# Patient Record
Sex: Male | Born: 1945 | Race: White | Hispanic: No | Smoking: Former smoker
Health system: Southern US, Community
[De-identification: ages and names within clinical notes are randomized; demographics above are authoritative.]

## PROBLEM LIST (undated history)

## (undated) DIAGNOSIS — R05 Cough: Secondary | ICD-10-CM

## (undated) DIAGNOSIS — R059 Cough, unspecified: Secondary | ICD-10-CM

## (undated) DIAGNOSIS — R002 Palpitations: Secondary | ICD-10-CM

## (undated) DIAGNOSIS — I2510 Atherosclerotic heart disease of native coronary artery without angina pectoris: Secondary | ICD-10-CM

## (undated) DIAGNOSIS — K635 Polyp of colon: Secondary | ICD-10-CM

## (undated) DIAGNOSIS — I82409 Acute embolism and thrombosis of unspecified deep veins of unspecified lower extremity: Secondary | ICD-10-CM

## (undated) DIAGNOSIS — Z972 Presence of dental prosthetic device (complete) (partial): Secondary | ICD-10-CM

## (undated) DIAGNOSIS — J189 Pneumonia, unspecified organism: Secondary | ICD-10-CM

## (undated) DIAGNOSIS — E785 Hyperlipidemia, unspecified: Secondary | ICD-10-CM

## (undated) DIAGNOSIS — D689 Coagulation defect, unspecified: Secondary | ICD-10-CM

## (undated) DIAGNOSIS — K589 Irritable bowel syndrome without diarrhea: Secondary | ICD-10-CM

## (undated) DIAGNOSIS — J302 Other seasonal allergic rhinitis: Secondary | ICD-10-CM

## (undated) DIAGNOSIS — I428 Other cardiomyopathies: Secondary | ICD-10-CM

## (undated) DIAGNOSIS — F32A Depression, unspecified: Secondary | ICD-10-CM

## (undated) DIAGNOSIS — I10 Essential (primary) hypertension: Secondary | ICD-10-CM

## (undated) DIAGNOSIS — I4819 Other persistent atrial fibrillation: Secondary | ICD-10-CM

## (undated) DIAGNOSIS — F329 Major depressive disorder, single episode, unspecified: Secondary | ICD-10-CM

## (undated) DIAGNOSIS — F419 Anxiety disorder, unspecified: Secondary | ICD-10-CM

## (undated) DIAGNOSIS — K219 Gastro-esophageal reflux disease without esophagitis: Secondary | ICD-10-CM

## (undated) HISTORY — DX: Anxiety disorder, unspecified: F41.9

## (undated) HISTORY — DX: Other persistent atrial fibrillation: I48.19

## (undated) HISTORY — PX: POLYPECTOMY: SHX149

## (undated) HISTORY — DX: Irritable bowel syndrome, unspecified: K58.9

## (undated) HISTORY — DX: Other cardiomyopathies: I42.8

## (undated) HISTORY — PX: APPENDECTOMY: SHX54

## (undated) HISTORY — DX: Coagulation defect, unspecified: D68.9

## (undated) HISTORY — DX: Polyp of colon: K63.5

## (undated) HISTORY — PX: COLONOSCOPY: SHX174

## (undated) HISTORY — DX: Hyperlipidemia, unspecified: E78.5

## (undated) HISTORY — DX: Atherosclerotic heart disease of native coronary artery without angina pectoris: I25.10

## (undated) HISTORY — DX: Acute embolism and thrombosis of unspecified deep veins of unspecified lower extremity: I82.409

## (undated) HISTORY — PX: TONSILLECTOMY AND ADENOIDECTOMY: SUR1326

---

## 1996-06-26 ENCOUNTER — Encounter (INDEPENDENT_AMBULATORY_CARE_PROVIDER_SITE_OTHER): Payer: Self-pay | Admitting: Internal Medicine

## 1998-04-08 ENCOUNTER — Encounter (INDEPENDENT_AMBULATORY_CARE_PROVIDER_SITE_OTHER): Payer: Self-pay | Admitting: Internal Medicine

## 1999-05-19 ENCOUNTER — Encounter: Payer: Self-pay | Admitting: Emergency Medicine

## 1999-05-19 ENCOUNTER — Emergency Department (HOSPITAL_COMMUNITY): Admission: EM | Admit: 1999-05-19 | Discharge: 1999-05-19 | Payer: Self-pay | Admitting: Emergency Medicine

## 1999-10-08 ENCOUNTER — Encounter: Payer: Self-pay | Admitting: Cardiology

## 2000-08-17 ENCOUNTER — Encounter (INDEPENDENT_AMBULATORY_CARE_PROVIDER_SITE_OTHER): Payer: Self-pay | Admitting: Internal Medicine

## 2000-08-17 LAB — CONVERTED CEMR LAB: PSA: 0.3 ng/mL

## 2001-11-30 ENCOUNTER — Encounter (INDEPENDENT_AMBULATORY_CARE_PROVIDER_SITE_OTHER): Payer: Self-pay | Admitting: Internal Medicine

## 2001-11-30 LAB — CONVERTED CEMR LAB: PSA: 0.3 ng/mL

## 2002-12-05 ENCOUNTER — Encounter (INDEPENDENT_AMBULATORY_CARE_PROVIDER_SITE_OTHER): Payer: Self-pay | Admitting: Internal Medicine

## 2003-09-18 ENCOUNTER — Encounter (INDEPENDENT_AMBULATORY_CARE_PROVIDER_SITE_OTHER): Payer: Self-pay | Admitting: Internal Medicine

## 2003-09-23 ENCOUNTER — Encounter (INDEPENDENT_AMBULATORY_CARE_PROVIDER_SITE_OTHER): Payer: Self-pay | Admitting: Internal Medicine

## 2003-09-23 LAB — CONVERTED CEMR LAB: PSA: 0.3 ng/mL

## 2003-12-04 ENCOUNTER — Ambulatory Visit: Payer: Self-pay | Admitting: Family Medicine

## 2004-01-28 ENCOUNTER — Ambulatory Visit: Payer: Self-pay | Admitting: Family Medicine

## 2004-02-13 ENCOUNTER — Ambulatory Visit: Payer: Self-pay | Admitting: Family Medicine

## 2004-02-27 ENCOUNTER — Ambulatory Visit: Payer: Self-pay | Admitting: Family Medicine

## 2004-03-26 ENCOUNTER — Ambulatory Visit: Payer: Self-pay | Admitting: Family Medicine

## 2004-12-23 ENCOUNTER — Ambulatory Visit: Payer: Self-pay | Admitting: Family Medicine

## 2005-01-11 ENCOUNTER — Ambulatory Visit: Payer: Self-pay | Admitting: Family Medicine

## 2005-01-17 ENCOUNTER — Encounter (INDEPENDENT_AMBULATORY_CARE_PROVIDER_SITE_OTHER): Payer: Self-pay | Admitting: Internal Medicine

## 2005-01-17 LAB — CONVERTED CEMR LAB: PSA: 0.27 ng/mL

## 2005-01-26 ENCOUNTER — Encounter (INDEPENDENT_AMBULATORY_CARE_PROVIDER_SITE_OTHER): Payer: Self-pay | Admitting: Internal Medicine

## 2005-01-26 ENCOUNTER — Ambulatory Visit: Payer: Self-pay | Admitting: Family Medicine

## 2005-01-26 LAB — CONVERTED CEMR LAB: PSA: 0.27 ng/mL

## 2005-03-10 ENCOUNTER — Ambulatory Visit: Payer: Self-pay | Admitting: Family Medicine

## 2005-05-16 ENCOUNTER — Ambulatory Visit: Payer: Self-pay | Admitting: Family Medicine

## 2005-10-14 ENCOUNTER — Ambulatory Visit: Payer: Self-pay | Admitting: Family Medicine

## 2006-03-06 ENCOUNTER — Ambulatory Visit: Payer: Self-pay | Admitting: Family Medicine

## 2006-03-08 ENCOUNTER — Ambulatory Visit: Payer: Self-pay | Admitting: Gastroenterology

## 2006-03-09 ENCOUNTER — Ambulatory Visit: Payer: Self-pay | Admitting: Gastroenterology

## 2006-03-09 ENCOUNTER — Encounter (INDEPENDENT_AMBULATORY_CARE_PROVIDER_SITE_OTHER): Payer: Self-pay | Admitting: *Deleted

## 2006-04-03 ENCOUNTER — Ambulatory Visit: Payer: Self-pay | Admitting: Internal Medicine

## 2006-04-03 LAB — CONVERTED CEMR LAB
ALT: 17 units/L (ref 0–40)
AST: 18 units/L (ref 0–37)
Cholesterol: 185 mg/dL (ref 0–200)
Direct LDL: 107.1 mg/dL
HDL: 36.9 mg/dL — ABNORMAL LOW (ref >39.0)
Total CHOL/HDL Ratio: 5
Triglycerides: 220 mg/dL (ref 0–149)
VLDL: 44 mg/dL — ABNORMAL HIGH (ref 0–40)

## 2006-04-20 ENCOUNTER — Encounter: Payer: Self-pay | Admitting: Internal Medicine

## 2006-04-20 DIAGNOSIS — F331 Major depressive disorder, recurrent, moderate: Secondary | ICD-10-CM

## 2006-04-20 DIAGNOSIS — E7800 Pure hypercholesterolemia, unspecified: Secondary | ICD-10-CM

## 2006-04-20 DIAGNOSIS — I10 Essential (primary) hypertension: Secondary | ICD-10-CM | POA: Insufficient documentation

## 2006-05-12 ENCOUNTER — Ambulatory Visit: Payer: Self-pay | Admitting: Family Medicine

## 2006-05-15 ENCOUNTER — Telehealth (INDEPENDENT_AMBULATORY_CARE_PROVIDER_SITE_OTHER): Payer: Self-pay | Admitting: *Deleted

## 2006-06-14 ENCOUNTER — Telehealth (INDEPENDENT_AMBULATORY_CARE_PROVIDER_SITE_OTHER): Payer: Self-pay | Admitting: *Deleted

## 2006-07-12 ENCOUNTER — Encounter: Payer: Self-pay | Admitting: Internal Medicine

## 2006-07-12 DIAGNOSIS — Z8679 Personal history of other diseases of the circulatory system: Secondary | ICD-10-CM

## 2006-07-12 DIAGNOSIS — D126 Benign neoplasm of colon, unspecified: Secondary | ICD-10-CM | POA: Insufficient documentation

## 2006-07-19 ENCOUNTER — Ambulatory Visit: Payer: Self-pay | Admitting: Family Medicine

## 2006-08-07 ENCOUNTER — Telehealth (INDEPENDENT_AMBULATORY_CARE_PROVIDER_SITE_OTHER): Payer: Self-pay | Admitting: Internal Medicine

## 2006-09-29 ENCOUNTER — Telehealth (INDEPENDENT_AMBULATORY_CARE_PROVIDER_SITE_OTHER): Payer: Self-pay | Admitting: Internal Medicine

## 2006-10-09 ENCOUNTER — Ambulatory Visit: Payer: Self-pay | Admitting: Cardiology

## 2006-10-17 ENCOUNTER — Ambulatory Visit: Payer: Self-pay

## 2006-10-17 ENCOUNTER — Emergency Department (HOSPITAL_COMMUNITY): Admission: EM | Admit: 2006-10-17 | Discharge: 2006-10-17 | Payer: Self-pay | Admitting: Emergency Medicine

## 2006-11-09 ENCOUNTER — Ambulatory Visit: Payer: Self-pay | Admitting: Cardiology

## 2006-11-14 ENCOUNTER — Encounter (INDEPENDENT_AMBULATORY_CARE_PROVIDER_SITE_OTHER): Payer: Self-pay | Admitting: Internal Medicine

## 2007-01-24 ENCOUNTER — Ambulatory Visit: Payer: Self-pay | Admitting: Family Medicine

## 2007-01-28 ENCOUNTER — Encounter (INDEPENDENT_AMBULATORY_CARE_PROVIDER_SITE_OTHER): Payer: Self-pay | Admitting: Internal Medicine

## 2007-01-29 LAB — CONVERTED CEMR LAB
Calcium: 9.2 mg/dL (ref 8.4–10.5)
GFR calc Af Amer: 66 mL/min
GFR calc non Af Amer: 55 mL/min
Glucose, Bld: 102 mg/dL — ABNORMAL HIGH (ref 70–99)
PSA: 1.83 ng/mL (ref 0.10–4.00)
Potassium: 4.6 meq/L (ref 3.5–5.1)
Total CHOL/HDL Ratio: 8.6
Triglycerides: 558 mg/dL (ref 0–149)

## 2007-03-27 ENCOUNTER — Ambulatory Visit: Payer: Self-pay | Admitting: Family Medicine

## 2007-03-30 LAB — CONVERTED CEMR LAB
ALT: 26 units/L (ref 0–53)
LDL Cholesterol: 123 mg/dL — ABNORMAL HIGH (ref 0–99)
Total CHOL/HDL Ratio: 4.6
Triglycerides: 100 mg/dL (ref 0–149)
VLDL: 20 mg/dL (ref 0–40)

## 2007-04-10 ENCOUNTER — Telehealth (INDEPENDENT_AMBULATORY_CARE_PROVIDER_SITE_OTHER): Payer: Self-pay | Admitting: Internal Medicine

## 2007-04-30 ENCOUNTER — Ambulatory Visit: Payer: Self-pay | Admitting: Family Medicine

## 2007-04-30 DIAGNOSIS — J309 Allergic rhinitis, unspecified: Secondary | ICD-10-CM | POA: Insufficient documentation

## 2007-04-30 DIAGNOSIS — F528 Other sexual dysfunction not due to a substance or known physiological condition: Secondary | ICD-10-CM | POA: Insufficient documentation

## 2007-06-05 ENCOUNTER — Telehealth (INDEPENDENT_AMBULATORY_CARE_PROVIDER_SITE_OTHER): Payer: Self-pay | Admitting: Internal Medicine

## 2007-06-18 ENCOUNTER — Ambulatory Visit: Payer: Self-pay | Admitting: Family Medicine

## 2007-06-26 LAB — CONVERTED CEMR LAB
ALT: 27 units/L (ref 0–53)
AST: 24 units/L (ref 0–37)
Total CHOL/HDL Ratio: 4.5
Triglycerides: 73 mg/dL (ref 0–149)

## 2007-06-29 ENCOUNTER — Ambulatory Visit: Payer: Self-pay | Admitting: Family Medicine

## 2007-07-18 ENCOUNTER — Telehealth (INDEPENDENT_AMBULATORY_CARE_PROVIDER_SITE_OTHER): Payer: Self-pay | Admitting: Internal Medicine

## 2007-08-07 ENCOUNTER — Encounter (INDEPENDENT_AMBULATORY_CARE_PROVIDER_SITE_OTHER): Payer: Self-pay | Admitting: Internal Medicine

## 2007-08-13 ENCOUNTER — Telehealth (INDEPENDENT_AMBULATORY_CARE_PROVIDER_SITE_OTHER): Payer: Self-pay | Admitting: Internal Medicine

## 2007-09-10 ENCOUNTER — Encounter (INDEPENDENT_AMBULATORY_CARE_PROVIDER_SITE_OTHER): Payer: Self-pay | Admitting: Internal Medicine

## 2007-10-16 ENCOUNTER — Telehealth: Payer: Self-pay | Admitting: Family Medicine

## 2007-10-30 ENCOUNTER — Ambulatory Visit: Payer: Self-pay | Admitting: Family Medicine

## 2007-10-30 DIAGNOSIS — Z85828 Personal history of other malignant neoplasm of skin: Secondary | ICD-10-CM | POA: Insufficient documentation

## 2007-10-31 LAB — CONVERTED CEMR LAB
BUN: 14 mg/dL (ref 6–23)
CO2: 30 meq/L (ref 19–32)
Chloride: 107 meq/L (ref 96–112)
Cholesterol: 174 mg/dL (ref 0–200)
Creatinine, Ser: 1.4 mg/dL (ref 0.4–1.5)
PSA: 0.39 ng/mL (ref 0.10–4.00)
Potassium: 4.8 meq/L (ref 3.5–5.1)
Triglycerides: 91 mg/dL (ref 0–149)

## 2007-11-12 ENCOUNTER — Telehealth (INDEPENDENT_AMBULATORY_CARE_PROVIDER_SITE_OTHER): Payer: Self-pay | Admitting: Internal Medicine

## 2007-12-04 ENCOUNTER — Telehealth (INDEPENDENT_AMBULATORY_CARE_PROVIDER_SITE_OTHER): Payer: Self-pay | Admitting: Internal Medicine

## 2007-12-05 ENCOUNTER — Telehealth (INDEPENDENT_AMBULATORY_CARE_PROVIDER_SITE_OTHER): Payer: Self-pay | Admitting: Internal Medicine

## 2007-12-05 ENCOUNTER — Encounter (INDEPENDENT_AMBULATORY_CARE_PROVIDER_SITE_OTHER): Payer: Self-pay | Admitting: Internal Medicine

## 2007-12-25 ENCOUNTER — Ambulatory Visit: Payer: Self-pay | Admitting: Family Medicine

## 2008-01-01 ENCOUNTER — Ambulatory Visit: Payer: Self-pay | Admitting: Family Medicine

## 2008-01-01 DIAGNOSIS — J069 Acute upper respiratory infection, unspecified: Secondary | ICD-10-CM | POA: Insufficient documentation

## 2008-01-09 ENCOUNTER — Encounter (INDEPENDENT_AMBULATORY_CARE_PROVIDER_SITE_OTHER): Payer: Self-pay | Admitting: *Deleted

## 2008-01-09 LAB — CONVERTED CEMR LAB
ALT: 30 units/L (ref 0–53)
Albumin: 3.5 g/dL (ref 3.5–5.2)
Alkaline Phosphatase: 60 units/L (ref 39–117)
Cholesterol: 200 mg/dL (ref 0–200)
LDL Cholesterol: 141 mg/dL — ABNORMAL HIGH (ref 0–99)
Total Protein: 6.6 g/dL (ref 6.0–8.3)
Triglycerides: 119 mg/dL (ref 0–149)
VLDL: 24 mg/dL (ref 0–40)

## 2008-01-21 ENCOUNTER — Telehealth (INDEPENDENT_AMBULATORY_CARE_PROVIDER_SITE_OTHER): Payer: Self-pay | Admitting: Internal Medicine

## 2008-02-11 ENCOUNTER — Telehealth: Payer: Self-pay | Admitting: Family Medicine

## 2008-04-23 ENCOUNTER — Encounter (INDEPENDENT_AMBULATORY_CARE_PROVIDER_SITE_OTHER): Payer: Self-pay | Admitting: *Deleted

## 2008-04-30 ENCOUNTER — Ambulatory Visit: Payer: Self-pay | Admitting: Family Medicine

## 2008-05-01 LAB — CONVERTED CEMR LAB
ALT: 24 units/L (ref 0–53)
Calcium: 9 mg/dL (ref 8.4–10.5)
GFR calc non Af Amer: 54.26 mL/min (ref >60)
Glucose, Bld: 105 mg/dL — ABNORMAL HIGH (ref 70–99)
HDL: 44.8 mg/dL (ref >39.00)
Potassium: 5.1 meq/L (ref 3.5–5.1)
Sodium: 144 meq/L (ref 135–145)
TSH: 1.15 microintl units/mL (ref 0.35–5.50)
Triglycerides: 87 mg/dL (ref 0.0–149.0)

## 2008-06-26 DIAGNOSIS — R609 Edema, unspecified: Secondary | ICD-10-CM | POA: Insufficient documentation

## 2008-06-27 ENCOUNTER — Telehealth (INDEPENDENT_AMBULATORY_CARE_PROVIDER_SITE_OTHER): Payer: Self-pay | Admitting: Internal Medicine

## 2008-07-18 ENCOUNTER — Ambulatory Visit: Payer: Self-pay | Admitting: Family Medicine

## 2008-07-18 ENCOUNTER — Inpatient Hospital Stay: Payer: Self-pay | Admitting: Internal Medicine

## 2008-07-18 ENCOUNTER — Encounter: Payer: Self-pay | Admitting: Cardiology

## 2008-07-18 ENCOUNTER — Encounter (INDEPENDENT_AMBULATORY_CARE_PROVIDER_SITE_OTHER): Payer: Self-pay | Admitting: Internal Medicine

## 2008-07-18 DIAGNOSIS — Z86718 Personal history of other venous thrombosis and embolism: Secondary | ICD-10-CM | POA: Insufficient documentation

## 2008-07-20 ENCOUNTER — Encounter (INDEPENDENT_AMBULATORY_CARE_PROVIDER_SITE_OTHER): Payer: Self-pay | Admitting: Internal Medicine

## 2008-07-20 ENCOUNTER — Encounter: Payer: Self-pay | Admitting: Cardiology

## 2008-07-22 ENCOUNTER — Ambulatory Visit: Payer: Self-pay | Admitting: Cardiovascular Disease

## 2008-07-22 ENCOUNTER — Encounter: Payer: Self-pay | Admitting: Family Medicine

## 2008-07-23 ENCOUNTER — Encounter: Payer: Self-pay | Admitting: Cardiology

## 2008-07-25 ENCOUNTER — Encounter: Payer: Self-pay | Admitting: Cardiology

## 2008-07-25 ENCOUNTER — Other Ambulatory Visit: Payer: Self-pay | Admitting: Cardiology

## 2008-07-28 ENCOUNTER — Encounter: Payer: Self-pay | Admitting: Cardiology

## 2008-07-29 ENCOUNTER — Ambulatory Visit: Payer: Self-pay | Admitting: Family Medicine

## 2008-08-05 LAB — CONVERTED CEMR LAB: INR: 1.3 (ref 0.0–1.5)

## 2008-08-06 ENCOUNTER — Ambulatory Visit: Payer: Self-pay | Admitting: Cardiology

## 2008-08-13 ENCOUNTER — Ambulatory Visit: Payer: Self-pay | Admitting: Internal Medicine

## 2008-08-27 ENCOUNTER — Ambulatory Visit: Payer: Self-pay | Admitting: Cardiovascular Disease

## 2008-08-28 ENCOUNTER — Telehealth (INDEPENDENT_AMBULATORY_CARE_PROVIDER_SITE_OTHER): Payer: Self-pay | Admitting: Internal Medicine

## 2008-09-01 ENCOUNTER — Encounter: Payer: Self-pay | Admitting: *Deleted

## 2008-09-02 ENCOUNTER — Ambulatory Visit: Payer: Self-pay | Admitting: Family Medicine

## 2008-09-05 ENCOUNTER — Telehealth (INDEPENDENT_AMBULATORY_CARE_PROVIDER_SITE_OTHER): Payer: Self-pay | Admitting: Internal Medicine

## 2008-09-09 ENCOUNTER — Telehealth (INDEPENDENT_AMBULATORY_CARE_PROVIDER_SITE_OTHER): Payer: Self-pay | Admitting: Internal Medicine

## 2008-09-16 ENCOUNTER — Ambulatory Visit: Payer: Self-pay | Admitting: Family Medicine

## 2008-09-18 ENCOUNTER — Ambulatory Visit: Payer: Self-pay | Admitting: Internal Medicine

## 2008-09-18 LAB — CONVERTED CEMR LAB
POC INR: 4
Prothrombin Time: 24.4 s

## 2008-10-02 ENCOUNTER — Ambulatory Visit: Payer: Self-pay | Admitting: Cardiology

## 2008-10-28 ENCOUNTER — Ambulatory Visit: Payer: Self-pay | Admitting: Family Medicine

## 2008-10-29 LAB — CONVERTED CEMR LAB
ALT: 19 units/L (ref 0–53)
AST: 22 units/L (ref 0–37)
BUN: 19 mg/dL (ref 6–23)
Calcium: 9.4 mg/dL (ref 8.4–10.5)
Cholesterol: 195 mg/dL (ref 0–200)
Creatinine, Ser: 1.6 mg/dL — ABNORMAL HIGH (ref 0.4–1.5)
GFR calc non Af Amer: 46.44 mL/min (ref >60)
Glucose, Bld: 97 mg/dL (ref 70–99)
PSA: 0.62 ng/mL (ref 0.10–4.00)
Potassium: 5.1 meq/L (ref 3.5–5.1)
VLDL: 24.4 mg/dL (ref 0.0–40.0)

## 2008-10-30 ENCOUNTER — Ambulatory Visit: Payer: Self-pay | Admitting: Cardiology

## 2008-10-30 LAB — CONVERTED CEMR LAB: POC INR: 2.6

## 2008-11-27 ENCOUNTER — Ambulatory Visit: Payer: Self-pay | Admitting: Cardiology

## 2008-11-27 LAB — CONVERTED CEMR LAB: POC INR: 3.6

## 2008-12-08 ENCOUNTER — Ambulatory Visit: Payer: Self-pay | Admitting: Internal Medicine

## 2008-12-08 LAB — CONVERTED CEMR LAB: POC INR: 2.1

## 2008-12-30 ENCOUNTER — Ambulatory Visit: Payer: Self-pay

## 2008-12-30 ENCOUNTER — Encounter: Payer: Self-pay | Admitting: Cardiology

## 2009-01-05 ENCOUNTER — Ambulatory Visit: Payer: Self-pay | Admitting: Cardiovascular Disease

## 2009-01-06 ENCOUNTER — Telehealth: Payer: Self-pay | Admitting: Cardiology

## 2009-01-20 ENCOUNTER — Ambulatory Visit: Payer: Self-pay | Admitting: Cardiology

## 2009-02-10 ENCOUNTER — Ambulatory Visit: Payer: Self-pay | Admitting: Cardiology

## 2009-02-10 LAB — CONVERTED CEMR LAB: POC INR: 2.3

## 2009-02-17 ENCOUNTER — Telehealth: Payer: Self-pay | Admitting: Family Medicine

## 2009-03-10 ENCOUNTER — Ambulatory Visit: Payer: Self-pay | Admitting: Cardiovascular Disease

## 2009-03-19 ENCOUNTER — Telehealth: Payer: Self-pay | Admitting: Family Medicine

## 2009-03-19 ENCOUNTER — Telehealth: Payer: Self-pay | Admitting: Cardiology

## 2009-04-07 ENCOUNTER — Ambulatory Visit: Payer: Self-pay | Admitting: Cardiovascular Disease

## 2009-04-07 LAB — CONVERTED CEMR LAB: POC INR: 3.3

## 2009-04-30 ENCOUNTER — Ambulatory Visit: Payer: Self-pay | Admitting: Cardiology

## 2009-04-30 LAB — CONVERTED CEMR LAB: POC INR: 1.7

## 2009-05-14 ENCOUNTER — Ambulatory Visit: Payer: Self-pay | Admitting: Cardiology

## 2009-05-14 LAB — CONVERTED CEMR LAB: POC INR: 2.6

## 2009-05-21 ENCOUNTER — Ambulatory Visit: Payer: Self-pay | Admitting: Family Medicine

## 2009-05-22 LAB — CONVERTED CEMR LAB
ALT: 24 units/L (ref 0–53)
AST: 26 units/L (ref 0–37)
Albumin: 4 g/dL (ref 3.5–5.2)
BUN: 19 mg/dL (ref 6–23)
Calcium: 9.1 mg/dL (ref 8.4–10.5)
Creatinine, Ser: 1.2 mg/dL (ref 0.4–1.5)
Glucose, Bld: 103 mg/dL — ABNORMAL HIGH (ref 70–99)
HDL: 47.6 mg/dL (ref >39.00)
PSA: 0.67 ng/mL (ref 0.10–4.00)
Potassium: 5 meq/L (ref 3.5–5.1)
Sodium: 143 meq/L (ref 135–145)
Total Bilirubin: 0.6 mg/dL (ref 0.3–1.2)
Triglycerides: 111 mg/dL (ref 0.0–149.0)

## 2009-06-01 ENCOUNTER — Telehealth: Payer: Self-pay | Admitting: Family Medicine

## 2009-06-11 ENCOUNTER — Ambulatory Visit: Payer: Self-pay | Admitting: Cardiovascular Disease

## 2009-06-11 LAB — CONVERTED CEMR LAB: POC INR: 2.4

## 2009-07-01 ENCOUNTER — Telehealth: Payer: Self-pay | Admitting: Family Medicine

## 2009-07-13 ENCOUNTER — Ambulatory Visit: Payer: Self-pay | Admitting: Cardiovascular Disease

## 2009-08-19 ENCOUNTER — Ambulatory Visit: Payer: Self-pay | Admitting: Cardiovascular Disease

## 2009-08-21 ENCOUNTER — Ambulatory Visit: Payer: Self-pay | Admitting: Family Medicine

## 2009-08-24 LAB — CONVERTED CEMR LAB
Direct LDL: 149.4 mg/dL
HDL: 46.8 mg/dL (ref >39.00)
Total CHOL/HDL Ratio: 5
Triglycerides: 180 mg/dL — ABNORMAL HIGH (ref 0.0–149.0)

## 2009-08-28 ENCOUNTER — Ambulatory Visit: Payer: Self-pay | Admitting: Family Medicine

## 2009-08-28 ENCOUNTER — Ambulatory Visit: Payer: Self-pay | Admitting: Cardiology

## 2009-09-16 ENCOUNTER — Ambulatory Visit: Payer: Self-pay | Admitting: Cardiology

## 2009-09-16 LAB — CONVERTED CEMR LAB: POC INR: 2.9

## 2009-10-14 ENCOUNTER — Ambulatory Visit: Payer: Self-pay | Admitting: Cardiology

## 2009-10-26 ENCOUNTER — Ambulatory Visit: Payer: Self-pay | Admitting: Family Medicine

## 2009-10-27 ENCOUNTER — Ambulatory Visit: Payer: Self-pay | Admitting: Family Medicine

## 2009-10-28 ENCOUNTER — Encounter: Payer: Self-pay | Admitting: Family Medicine

## 2009-10-28 LAB — CONVERTED CEMR LAB
AST: 22 units/L (ref 0–37)
Albumin: 4 g/dL (ref 3.5–5.2)
HDL: 38.7 mg/dL — ABNORMAL LOW (ref >39.00)
LDL Cholesterol: 99 mg/dL (ref 0–99)
Total Bilirubin: 0.7 mg/dL (ref 0.3–1.2)
Total CHOL/HDL Ratio: 4
Triglycerides: 112 mg/dL (ref 0.0–149.0)
VLDL: 22.4 mg/dL (ref 0.0–40.0)

## 2009-11-11 ENCOUNTER — Ambulatory Visit: Payer: Self-pay | Admitting: Cardiovascular Disease

## 2009-11-11 LAB — CONVERTED CEMR LAB: POC INR: 2.8

## 2009-12-09 ENCOUNTER — Ambulatory Visit: Payer: Self-pay | Admitting: Cardiovascular Disease

## 2009-12-09 LAB — CONVERTED CEMR LAB: POC INR: 2.6

## 2010-01-06 ENCOUNTER — Ambulatory Visit: Payer: Self-pay

## 2010-01-13 ENCOUNTER — Ambulatory Visit
Admission: RE | Admit: 2010-01-13 | Discharge: 2010-01-13 | Payer: Self-pay | Source: Home / Self Care | Attending: Cardiovascular Disease | Admitting: Cardiovascular Disease

## 2010-01-26 ENCOUNTER — Telehealth: Payer: Self-pay | Admitting: Family Medicine

## 2010-02-16 NOTE — Assessment & Plan Note (Signed)
Summary: F6M/AMD  Medications Added OMEPRAZOLE 40 MG CPDR (OMEPRAZOLE) Take 1 tablet daily. PROSTAGLANDIN E1  POWD (ALPROSTADIL) 40 micrograms/ml as needed        Visit Type:  6 mo f/u Primary Provider:  Ruthe Mannan MD  CC:  headache says due to sinuses...denies any other complaints today.  History of Present Illness: Anthony Kim comes in today for followup of his history of right lower extremity DVT and Coumadin.  His clot is resolved ultrasound. We have decided to continue him on lifelong Coumadin. He's had no problems with his protimes and no sign of bleeding.  He denies any angina or chest pain.  His blood pressure in relatively good control. His lower extremity edema has responded well to support hose which he does not mind wearing.  His most recent lipid panel I've reviewed with him today. Total cholesterol 220, triglycerides 180, HDL 46, LDL 149. I recommended a statin and come off WelChol. I would avoid simvastatin since he is on TriCor.  Current Medications (verified): 1)  Zegerid 40-1100 Mg Caps (Omeprazole-Sodium Bicarbonate) .... Take One By Mouth Daily 2)  Welchol 625 Mg  Tabs (Colesevelam Hcl) .... 6 Tabs Once Daily 3)  One-Daily Multivitamins   Tabs (Multiple Vitamin) .... Take 1 Tablet By Mouth Two Times A Day 4)  Wellbutrin Xl 150 Mg  Tb24 (Bupropion Hcl) .Marland Kitchen.. 1 By Mouth Once Daily 5)  Senna Leaves   Leav (Senna) .... 2 Once Daily 6)  Benadryl 25 Mg  Caps (Diphenhydramine Hcl) .... 2 By Mouth At Bedtime As Needed 7)  Xanax Xr 0.5 Mg  Tb24 (Alprazolam) .... Take 1 -2 By Mouth Daily 8)  Claritin 10 Mg Tabs (Loratadine) .... As Needed 9)  Tricor 145 Mg Tabs (Fenofibrate) .Marland Kitchen.. 1 Once Daily For Triglycerides and Cholesterol 10)  Quercetin 50 Mg Tabs (Quercetin) .... Otc Takes  One To Two Daily 11)  Warfarin Sodium 5 Mg Tabs (Warfarin Sodium) .... Take One and A Half Tabs By Mouth Daily As Directed. 12)  Prostaglandin E1  Powd (Alprostadil) .... 40 Micrograms/ml As Needed 13)   Micardis 40 Mg Tabs (Telmisartan) .... Take One Tablet By Mouth Daily 14)  Fish Oil   Oil (Fish Oil) .... Take 1 or 2 Per Day 15)  Elastamine .... Take 1 Tablet By Mouth Two Times A Day  Allergies: 1)  ! Iodine Strong (Iodine Strong (Lugols))  Past History:  Past Medical History: Last updated: 06/26/2008 EDEMA (ICD-782.3) HYPERTENSION (ICD-401.9) HYPERCHOLESTEROLEMIA (ICD-272.0) URI (ICD-465.9) ERECTILE DYSFUNCTION (ICD-302.72) RHINITIS (ICD-477.9) COLONIC POLYPS 2/08 (ICD-211.3) DEPRESSION (ICD-311) ARRHYTHMIA, HX OF 6/98 (ICD-V12.50) SKIN CANCER, HX OF (ICD-V10.83)     Past Surgical History: Last updated: 04/20/2006 Appendectomy age 62 (1967-68) T & A age 65 or 4 Colonoscopy 2/08  Family History: Last updated: 05/21/2009 Father: Alive 24, CABG, age 75; MI, prostate cancer--2009--several falls, angry since wife in nsg home Mother: Alive 54, heart disease (arrhythmia)---09-9/11/09--CVA, L hemiparesis Siblings: No brothers, 1 sister HBP   PGF: died 6 MI   PGM: died 25 ?    MGF:  Died 63's early hardening of arteries MGM:  80+ Low K+ P. uncle - stenosis of carotids (36) P. uncle - died with MI - CABG - Lung CA (58) P. uncle - CABG P. uncle - CABG Sister:  High BP  Social History: Last updated: 10/30/2007 Former Smoker, quit 7-8 years ago Alcohol use-yes, rare Regular exercise-no Marital Status: Married Children: 1 son (30) Occupation: sells outdoor equipment   Risk Factors: Alcohol  Use: 1 (10/30/2007) Exercise: no (07/12/2006)  Risk Factors: Smoking Status: quit (07/12/2006) Passive Smoke Exposure: no (10/30/2007)  Review of Systems       negative other than history of present illness  Vital Signs:  Patient profile:   65 year old male Height:      70.25 inches Weight:      238 pounds BMI:     34.03 Pulse rate:   68 / minute Pulse rhythm:   regular BP sitting:   132 / 90  (left arm) Cuff size:   large  Vitals Entered By: Danielle Rankin, CMA  (August 28, 2009 10:29 AM)  Physical Exam  General:  overweight, in no acute distress Head:  normocephalic and atraumatic Eyes:  PERRLA/EOM intact; conjunctiva and lids normal. Neck:  Neck supple, no JVD. No masses, thyromegaly or abnormal cervical nodes. Chest Wall:  no deformities or breast masses noted Lungs:  Clear bilaterally to auscultation and percussion. Heart:  be a nondisplaced, regular rate and rhythm, normal S1-S2, carotids equal bilaterally without bruits Msk:  Back normal, normal gait. Muscle strength and tone normal. Pulses:  pulses normal in all 4 extremities Extremities:  1+ left pedal edema and 1+ right pedal edema.   Neurologic:  Alert and oriented x 3. Skin:  Intact without lesions or rashes. Psych:  Normal affect.   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 I do not see any advantage to switching to Pradaxa at this point. Discussed with patient.  Problem # 2:  COUMADIN THERAPY (ICD-V58.61) Assessment: Unchanged  Problem # 3:  EDEMA LEG (ICD-782.3) Assessment: Improved  Problem # 4:  HYPERTENSION (ICD-401.9) Assessment: Improved  His updated medication list for this problem includes:    Micardis 40 Mg Tabs (Telmisartan) .Marland Kitchen... Take one tablet by mouth daily  His updated medication list for this problem includes:    Micardis 40 Mg Tabs (Telmisartan) .Marland Kitchen... Take one tablet by mouth daily  Problem # 5:  HYPERCHOLESTEROLEMIA (ICD-272.0) i have recommended Lipitor 20 mg per day with followup blood work in 6-8 weeks. He can discontinue WelChol. Goal LDL is 70 it possible. I will send a note to his primary care who he has seen this afternoon. His updated medication list for this problem includes:i    Welchol 625 Mg Tabs (Colesevelam hcl) .Marland KitchenMarland KitchenMarland KitchenMarland Kitchen 6 tabs once daily    Tricor 145 Mg Tabs (Fenofibrate) .Marland Kitchen... 1 once daily for triglycerides and cholesterol  His updated  medication list for this problem includes:    Welchol 625 Mg Tabs (Colesevelam hcl) .Marland KitchenMarland KitchenMarland KitchenMarland Kitchen 6 tabs once daily    Tricor 145 Mg Tabs (Fenofibrate) .Marland Kitchen... 1 once daily for triglycerides and cholesterol  Patient Instructions: 1)  Your physician recommends that you schedule a follow-up appointment in: 1 year with Dr. Daleen Squibb 2)  Recommended lipitor 20mg  daily with follow-up lab work in 6-8 weeks.  Also recommend discontinue Welchol.  Pcp will be notified of suggested changes to medication. Prescriptions: OMEPRAZOLE 40 MG CPDR (OMEPRAZOLE) Take 1 tablet daily.  #30 x 11   Entered by:   Lisabeth Devoid RN   Authorized by:   Gaylord Shih, MD, [facility]   Signed by:   Lisabeth Devoid RN on 08/28/2009   Method used:   Print then Give to Patient   RxID:   [id number]

## 2010-02-16 NOTE — Assessment & Plan Note (Signed)
Summary: NEW PATIENT EST CARE/NT   Vital Signs:  Patient profile:   65 year old male Height:      70.25 inches Weight:      239.25 pounds BMI:     34.21 Temp:     98.4 degrees F oral Pulse rate:   76 / minute Pulse rhythm:   regular BP sitting:   126 / 70  (left arm) Cuff size:   large  Vitals Entered By: Delilah Shan CMA Duncan Dull) (May 21, 2009 10:50 AM) CC: New Patient to Establish (BDB)   History of Present Illness: 65 yo new to me here for CPX with no compliants.  HTN- tolerating Micardis 40 mg daily  HLD- no issues with Trichor or Welchol.  Has been on both of them for years.  Due for FLP today.    Anxiety/ depression- situational, taking care of his elderly parents.  Mom is now on hospice. Feels Wellbutrin XL 150 mg with as needed Xanax is working well.  h/o recurrent DVT- lifelong coumadin.  Well man- UTD colonoscopy, immunizations.  Due for other prevention.   Current Medications (verified): 1)  Zegerid 40-1100 Mg Caps (Omeprazole-Sodium Bicarbonate) .... Take One By Mouth Daily 2)  Welchol 625 Mg  Tabs (Colesevelam Hcl) .... 6 Tabs Once Daily 3)  One-Daily Multivitamins   Tabs (Multiple Vitamin) .... Take 1 Tablet By Mouth Two Times A Day 4)  Wellbutrin Xl 150 Mg  Tb24 (Bupropion Hcl) .Marland Kitchen.. 1 By Mouth Once Daily 5)  Senna Leaves   Leav (Senna) .... 2 Once Daily 6)  Benadryl 25 Mg  Caps (Diphenhydramine Hcl) .... 2 By Mouth At Bedtime As Needed 7)  Xanax Xr 0.5 Mg  Tb24 (Alprazolam) .... Take 1 -2 By Mouth Daily 8)  Claritin 10 Mg Tabs (Loratadine) .... As Needed 9)  Tricor 145 Mg Tabs (Fenofibrate) .Marland Kitchen.. 1 Once Daily For Triglycerides and Cholesterol 10)  Quercetin 50 Mg Tabs (Quercetin) .... Otc Takes  One To Two Daily 11)  Warfarin Sodium 5 Mg Tabs (Warfarin Sodium) .... Take One and A Half Tabs By Mouth Daily As Directed. 12)  Prostaglandin E1  Powd (Alprostadil) .... 31mcg/ml Injection As Needed 13)  Micardis 40 Mg Tabs (Telmisartan) .... Take One Tablet By  Mouth Daily 14)  Fish Oil   Oil (Fish Oil) .... Take 1 or 2 Per Day 15)  Elastamine .... Take 1 Tablet By Mouth Two Times A Day  Allergies: 1)  ! Iodine Strong (Iodine Strong (Lugols))  Past History:  Family History: Last updated: 05/21/2009 Father: Alive 57, CABG, age 3; MI, prostate cancer--2009--several falls, angry since wife in nsg home Mother: Alive 51, heart disease (arrhythmia)---09-9/11/09--CVA, L hemiparesis Siblings: No brothers, 1 sister HBP   PGF: died 31 MI   PGM: died 40 ?    MGF:  Died 33's early hardening of arteries MGM:  80+ Low K+ P. uncle - stenosis of carotids (67) P. uncle - died with MI - CABG - Lung CA (61) P. uncle - CABG P. uncle - CABG Sister:  High BP  Social History: Last updated: 10/30/2007 Former Smoker, quit 7-8 years ago Alcohol use-yes, rare Regular exercise-no Marital Status: Married Children: 1 son (30) Occupation: sells outdoor equipment   Risk Factors: Alcohol Use: 1 (10/30/2007) Exercise: no (07/12/2006)  Risk Factors: Smoking Status: quit (07/12/2006) Passive Smoke Exposure: no (10/30/2007)  Family History: Father: Alive 49, CABG, age 30; MI, prostate cancer--2009--several falls, angry since wife in nsg home Mother: Alive 21,  heart disease (arrhythmia)---09-9/11/09--CVA, L hemiparesis Siblings: No brothers, 1 sister HBP   PGF: died 37 MI   PGM: died 26 ?    MGF:  Died 1's early hardening of arteries MGM:  80+ Low K+ P. uncle - stenosis of carotids (51) P. uncle - died with MI - CABG - Lung CA (79) P. uncle - CABG P. uncle - CABG Sister:  High BP  Social History: Reviewed history from 10/30/2007 and no changes required. Former Smoker, quit 7-8 years ago Alcohol use-yes, rare Regular exercise-no Marital Status: Married Children: 1 son (30) Occupation: sells outdoor equipment   Review of Systems      See HPI General:  Denies chills and fever. Eyes:  Denies blurring. ENT:  Denies difficulty swallowing. CV:   Denies chest pain or discomfort and difficulty breathing at night. Resp:  Denies shortness of breath. GI:  Denies abdominal pain and bloody stools. GU:  Denies dysuria, erectile dysfunction, incontinence, nocturia, urinary frequency, and urinary hesitancy. MS:  Denies muscle aches. Derm:  Denies rash. Neuro:  Denies headaches. Psych:  Denies anxiety and depression. Endo:  Denies cold intolerance and heat intolerance.  Physical Exam  General:  muscular, slightly overweight Head:  normocephalic and atraumatic Eyes:  PERRLA/EOM intact; conjunctiva and lids normal. Ears:  R ear normal and L ear normal.   Nose:  mucosal erythema and mucosal edema, boggy, no airflow obstruction, sinuses neg Mouth:  Teeth, gums and palate normal. Oral mucosa normal. Neck:  Neck supple, no JVD. No masses, thyromegaly or abnormal cervical nodes. Breasts:  No masses or gynecomastia noted Lungs:  Clear bilaterally to auscultation and percussion. Heart:  Non-displaced PMI, chest non-tender; regular rate and irregular rhythm, S1, S2 without murmurs, rubs or gallops.  Abdomen:  soft, non-tender, normal bowel sounds, no distention, no masses, no guarding, no abdominal hernia, no inguinal hernia, no hepatomegaly, and no splenomegaly.   Extremities:  trace left pedal edema and trace right pedal edema. varicose vein, no sign of DVT  Psych:  Normal affect.   Impression & Recommendations:  Problem # 1:  PREVENTIVE HEALTH CARE (ICD-V70.0) Reviewed preventive care protocols, scheduled due services, and updated immunizations Discussed nutrition, exercise, diet, and healthy lifestyle.  FLP, BMET, PSA today.  Complete Medication List: 1)  Zegerid 40-1100 Mg Caps (Omeprazole-sodium bicarbonate) .... Take one by mouth daily 2)  Welchol 625 Mg Tabs (Colesevelam hcl) .... 6 tabs once daily 3)  One-daily Multivitamins Tabs (Multiple vitamin) .... Take 1 tablet by mouth two times a day 4)  Wellbutrin Xl 150 Mg Tb24  (Bupropion hcl) .Marland Kitchen.. 1 by mouth once daily 5)  Senna Leaves Leav (Senna) .... 2 once daily 6)  Benadryl 25 Mg Caps (Diphenhydramine hcl) .... 2 by mouth at bedtime as needed 7)  Xanax Xr 0.5 Mg Tb24 (Alprazolam) .... Take 1 -2 by mouth daily 8)  Claritin 10 Mg Tabs (Loratadine) .... As needed 9)  Tricor 145 Mg Tabs (Fenofibrate) .Marland Kitchen.. 1 once daily for triglycerides and cholesterol 10)  Quercetin 50 Mg Tabs (Quercetin) .... Otc takes  one to two daily 11)  Warfarin Sodium 5 Mg Tabs (Warfarin sodium) .... Take one and a half tabs by mouth daily as directed. 12)  Prostaglandin E1 Powd (Alprostadil) .... 61mcg/ml injection as needed 13)  Micardis 40 Mg Tabs (Telmisartan) .... Take one tablet by mouth daily 14)  Fish Oil Oil (Fish oil) .... Take 1 or 2 per day 15)  Elastamine  .... Take 1 tablet by mouth two  times a day  Other Orders: Venipuncture (16109) TLB-Lipid Panel (80061-LIPID) TLB-Hepatic/Liver Function Pnl (80076-HEPATIC) TLB-BMP (Basic Metabolic Panel-BMET) (80048-METABOL) TLB-PSA (Prostate Specific Antigen) (84153-PSA)  Current Allergies (reviewed today): ! IODINE STRONG (IODINE STRONG (LUGOLS))  Flex Sig Next Due:  Not Indicated Colonoscopy Result Date:  03/01/2006 Colonoscopy Result:  polyps Colonoscopy Next Due:  5 yr Last Hemoccult Result: Negative (12/04/2003 11:09:11 AM) Hemoccult Next Due:  Not Indicated

## 2010-02-16 NOTE — Medication Information (Signed)
Summary: CCR/sgc  Anticoagulant Therapy  Managed by: Cloyde Reams, RN, BSN Referring MD: Valera Castle Supervising MD: Mariah Milling Indication 1: Deep Vein Thrombosis - Leg (ICD-451.1) Lab Used: [facility] Site: [place] INR POC 1.8 INR RANGE 2.0-3.0  Dietary changes: no    Health status changes: no    Bleeding/hemorrhagic complications: no    Recent/future hospitalizations: no    Any changes in medication regimen? no    Recent/future dental: no  Any missed doses?: no       Is patient compliant with meds? yes       Allergies: 1)  ! Iodine Strong (Iodine Strong (Lugols))  Anticoagulation Management History:      The patient is taking warfarin and comes in today for a routine follow up visit.  Positive risk factors for bleeding include an age of 46 years or older and presence of serious comorbidities.  The bleeding index is 'intermediate risk'.  Positive CHADS2 values include History of HTN.  Negative CHADS2 values include Age > 46 years old.  The start date was 07/19/2008.  His last INR was 1.3.  Anticoagulation responsible provider: G[facility].  INR POC: 1.8.  Cuvette Lot#: [id number].  Exp: 06/2010.    Anticoagulation Management Assessment/Plan:      The patient's current anticoagulation dose is Warfarin sodium 5 mg tabs: take one and a half tabs by mouth daily as directed..  The target INR is 2 - 3.  The next INR is due 09/16/2009.  Anticoagulation instructions were given to patient.  Results were reviewed/authorized by Cloyde Reams, RN, BSN.  He was notified by Cloyde Reams RN.         Prior Anticoagulation Instructions: INR 3.2  Skip today's dosage of coumadin, then resume same dosage 1 tablet daily except 1.5 tablets on Tuesdays, Thursdays, and Saturdays.  Recheck in 4 weeks.    Current Anticoagulation Instructions: INR 1.8  Take 1.5 tablets today, then resume same dosage 1 tablet daily except 1.5 tablets on Tuesdays, Thursdays, and  Saturdays.  Recheck in 4 weeks.

## 2010-02-16 NOTE — Progress Notes (Signed)
Summary: RX   Phone Note Call from Patient Call back at Home Phone [phone]   Caller: WIFE Call For: Anthony Kim Summary of Call: WOULD LIKE TO TALK TO SOMEONE ABOUT HIS MEDS-APPARENTLY HE IS SUPPOSED TO BE ON BENICAR Initial call taken by: Harlon Flor,  March 19, 2009 1:59 PM  Follow-up for Phone Call        Dr. Daleen Squibb- I see micardis was changed to benicar in your note but dont see any other documentation.  My. Hoare has been on micardis and was not aware of switching to benicar.  I do not see where an rx was ever placed.  Which medication would you like for him to be on? Follow-up by: Charlena Cross, RN, BSN,  March 19, 2009 2:16 PM  Additional Follow-up for Phone Call Additional follow up Details #1::        I didnt switch him...question insurance. If he wants to take Micardis that is fine with me. Additional Follow-up by: Gaylord Shih, MD, [facility],  March 19, 2009 4:28 PM     Appended Document: RX    Clinical Lists Changes  Medications: Changed medication from BENICAR 40 MG TABS (OLMESARTAN MEDOXOMIL) Take one tablet by mouth daily to MICARDIS 40 MG TABS (TELMISARTAN) Take one tablet by mouth daily - Signed Rx of MICARDIS 40 MG TABS (TELMISARTAN) Take one tablet by mouth daily;  #30 x 11;  Signed;  Entered by: Charlena Cross, RN, BSN;  Authorized by: Gaylord Shih, MD, [facility];  Method used: Faxed to [employer] [address], [place], [place], [place]  [address], Ph: [phone], Fax: [phone]    Prescriptions: MICARDIS 40 MG TABS (TELMISARTAN) Take one tablet by mouth daily  #30 x 11   Entered by:   Charlena Cross, RN, BSN   Authorized by:   Gaylord Shih, MD, [facility]   Signed by:   Charlena Cross, RN, BSN on 03/19/2009   Method used:   Faxed to ...       [employer] (mail-order)       [address] [place], [place]  [address]       Ph: [phone]       Fax: [phone]   RxID:   [id number]

## 2010-02-16 NOTE — Progress Notes (Signed)
Summary: Rx Bupropion  Phone Note Refill Request Call back at [phone] Message from:  [facility] on February 17, 2009 8:08 AM  Refills Requested: Medication #1:  WELLBUTRIN XL 150 MG  TB24 1 by mouth once daily   Last Refilled: 10/20/2008 Received faxed refill request, please advise   Method Requested: Electronic Initial call taken by: Linde Gillis CMA Duncan Dull),  February 17, 2009 8:09 AM  Follow-up for Phone Call        Pt 's med refilled but needs to establish with new Dr as Willaim Sheng has retired and I will be also.  Follow-up by: Shaune Leeks MD,  February 17, 2009 8:53 AM  Additional Follow-up for Phone Call Additional follow up Details #1::        Patient notifed via voicemail on his cell phone.  Rx left at front desk for pickup. Additional Follow-up by: Linde Gillis CMA Duncan Dull),  February 17, 2009 9:09 AM    Prescriptions: WELLBUTRIN XL 150 MG  TB24 (BUPROPION HCL) 1 by mouth once daily  #90 x 3   Entered and Authorized by:   Shaune Leeks MD   Signed by:   Linde Gillis CMA (AAMA) on 02/17/2009   Method used:   Print then Give to Patient   RxID:   [id number] WELLBUTRIN XL 150 MG  TB24 (BUPROPION HCL) 1 by mouth once daily  #90 x 3   Entered and Authorized by:   Shaune Leeks MD   Signed by:   Shaune Leeks MD on 02/17/2009   Method used:   Print then Give to Patient   RxID:   [id number]   Appended Document: Rx Bupropion Patient called back, scheduled him to see Dr. Dayton Martes for a new patient/est care on 05/21/2009 at 11:00.

## 2010-02-16 NOTE — Progress Notes (Signed)
Summary: ? Micardis 40mg  or Benicar 40mg   Phone Note From Pharmacy   Caller: [employer] For: Dr. Patsy Lager  Summary of Call: Received fax refill request on 03/17/2009 for Micardis 40mg .  After looking at patients last office visit note from Dr. Daleen Squibb it seems that he removed Micardis 40mg  from patient's medication regimen and changed him to Benicar 40mg  on 01/20/2009.  Called and spoke to patient's wife and I asked if he was still taking Micardis or was he on Benicar.  She says that she fixes his medications for the month and she has still been putting out Micardis because she wasn't aware of the change to Benicar.  Patient was put on Micardis by Billie Bean.  She will call Dr. Vern Claude office and get back in contact with me today.  Linde Gillis CMA Duncan Dull)  March 19, 2009 1:22 PM   Follow-up for Phone Call        per Dr. Daleen Squibb- pt can stay on Micardis 40 mg daily.  Rx called in to [facility] court at [employer] request. Follow-up by: Charlena Cross, RN, BSN,  March 19, 2009 4:39 PM

## 2010-02-16 NOTE — Medication Information (Signed)
Summary: rov/ewj  Anticoagulant Therapy  Managed by: Cloyde Reams, RN, BSN Referring MD: Valera Castle PCP: Ruthe Mannan MD Supervising MD: Mariah Milling Indication 1: Deep Vein Thrombosis - Leg (ICD-451.1) Lab Used: [facility] Site: [place] INR POC 2.8 INR RANGE 2.0-3.0  Dietary changes: no    Health status changes: no    Bleeding/hemorrhagic complications: no    Recent/future hospitalizations: no    Any changes in medication regimen? no    Recent/future dental: no  Any missed doses?: no       Is patient compliant with meds? yes       Allergies: 1)  ! Iodine Strong (Iodine Strong (Lugols))  Anticoagulation Management History:      The patient is taking warfarin and comes in today for a routine follow up visit.  Positive risk factors for bleeding include an age of 15 years or older and presence of serious comorbidities.  The bleeding index is 'intermediate risk'.  Positive CHADS2 values include History of HTN.  Negative CHADS2 values include Age > 13 years old.  The start date was 07/19/2008.  His last INR was 1.3.  Anticoagulation responsible provider: Elbie Statzer.  INR POC: 2.8.  Cuvette Lot#: [id number].  Exp: 11/2010.    Anticoagulation Management Assessment/Plan:      The patient's current anticoagulation dose is Warfarin sodium 5 mg tabs: take one and a half tabs by mouth daily as directed..  The target INR is 2 - 3.  The next INR is due 12/09/2009.  Anticoagulation instructions were given to patient.  Results were reviewed/authorized by Cloyde Reams, RN, BSN.  He was notified by Cloyde Reams RN.         Prior Anticoagulation Instructions: INR 2.1  Continue on same dosage 1 tablet daily except 1.5 tablets on Tuesdays, Thursdays, and Saturdays.  Recheck in 4 weeks.    Current Anticoagulation Instructions: INR 2.8  Continue on same dosage 1 tablet daily except 1.5 tablets on Tuesdays, Thursdays, and Saturdays.  Recheck in 4 weeks.

## 2010-02-16 NOTE — Medication Information (Signed)
Summary: CCR/AMD   Anticoagulant Therapy  Managed by: Charlena Cross, RN, BSN Referring MD: Valera Castle Supervising MD: Mariah Milling Indication 1: Deep Vein Thrombosis - Leg (ICD-451.1) Lab Used: [facility] Site: [place] INR POC 3.1 INR RANGE 2.0-3.0  Dietary changes: no    Health status changes: no    Bleeding/hemorrhagic complications: no    Recent/future hospitalizations: no    Any changes in medication regimen? no    Recent/future dental: no  Any missed doses?: no       Is patient compliant with meds? yes       Allergies: 1)  ! Iodine Strong (Iodine Strong (Lugols))  Anticoagulation Management History:      The patient is taking warfarin and comes in today for a routine follow up visit.  Positive risk factors for bleeding include presence of serious comorbidities.  Negative risk factors for bleeding include an age less than 58 years old.  The bleeding index is 'intermediate risk'.  Positive CHADS2 values include History of HTN.  Negative CHADS2 values include Age > 27 years old.  The start date was 07/19/2008.  His last INR was 1.3.  Anticoagulation responsible provider: Rylin Seavey.  INR POC: 3.1.    Anticoagulation Management Assessment/Plan:      The patient's current anticoagulation dose is Warfarin sodium 5 mg tabs: take one and a half tabs by mouth daily as directed..  The target INR is 2 - 3.  The next INR is due 04/07/2009.  Anticoagulation instructions were given to patient.  Results were reviewed/authorized by Charlena Cross, RN, BSN.  He was notified by Charlena Cross, RN, BSN.         Prior Anticoagulation Instructions: The patient is to continue with the same dose of coumadin.  This dosage includes: coumadin 7.5 mg daily wtih 5 mg MWF  Current Anticoagulation Instructions: The patient is to continue with the same dose of coumadin.  This dosage includes: coumadin 7.5 mg daily with 5 mg on MWF

## 2010-02-16 NOTE — Medication Information (Signed)
Summary: [facility] Clinic   Anticoagulant Therapy  Managed by: Shelby Dubin, PharmD, BCPS, CPP Referring MD: Valera Castle Supervising MD: Daleen Squibb MD, Maisie Fus Indication 1: Deep Vein Thrombosis - Leg (ICD-451.1) Lab Used: [facility] Site: [place] INR POC 3.3  Dietary changes: no    Health status changes: no    Bleeding/hemorrhagic complications: no    Recent/future hospitalizations: no    Any changes in medication regimen? no    Recent/future dental: no  Any missed doses?: no       Is patient compliant with meds? yes       Allergies: 1)  ! Iodine Strong (Iodine Strong (Lugols))  Anticoagulation Management History:      Positive risk factors for bleeding include presence of serious comorbidities.  Negative risk factors for bleeding include an age less than 82 years old.  The bleeding index is 'intermediate risk'.  Positive CHADS2 values include History of HTN.  Negative CHADS2 values include Age > 64 years old.  The start date was 07/19/2008.  His last INR was 1.3.  Anticoagulation responsible provider: Daleen Squibb MD, Maisie Fus.  INR POC: 3.3.    Anticoagulation Management Assessment/Plan:      The patient's current anticoagulation dose is Warfarin sodium 5 mg tabs: take one and a half tabs by mouth daily as directed..  The target INR is 2 - 3.  The next INR is due 02/03/2009.  Anticoagulation instructions were given to patient.  Results were reviewed/authorized by Shelby Dubin, PharmD, BCPS, CPP.  He was notified by Charlena Cross, RN, BSN.         Prior Anticoagulation Instructions: 7.5mg  everyday except 5mg  on M and F.  Check back 3 weeks  Current Anticoagulation Instructions: coumadin 7.5 mg daily with 5 mg on MWF

## 2010-02-16 NOTE — Progress Notes (Signed)
Summary: ? drug interaction  Phone Note Other Incoming   Caller: [employer] Summary of Call: Insurance company has faxed form regarding a possible drug interaction between fenofibrate and warfarin.  Letter is on your desk. Initial call taken by: Lowella Petties CMA,  July 01, 2009 3:56 PM

## 2010-02-16 NOTE — Medication Information (Signed)
Summary: rov/ewj  Anticoagulant Therapy  Managed by: Bethena Midget, RN, BSN Referring MD: Valera Castle PCP: Ruthe Mannan MD Supervising MD: Mariah Milling Indication 1: Deep Vein Thrombosis - Leg (ICD-451.1) Lab Used: [facility] Site: [place] INR POC 2.6 INR RANGE 2.0-3.0  Dietary changes: no    Health status changes: no    Bleeding/hemorrhagic complications: no    Recent/future hospitalizations: no    Any changes in medication regimen? no    Recent/future dental: no  Any missed doses?: no       Is patient compliant with meds? yes       Allergies: 1)  ! Iodine Strong (Iodine Strong (Lugols))  Anticoagulation Management History:      The patient is taking warfarin and comes in today for a routine follow up visit.  Positive risk factors for bleeding include an age of 66 years or older and presence of serious comorbidities.  The bleeding index is 'intermediate risk'.  Positive CHADS2 values include History of HTN.  Negative CHADS2 values include Age > 66 years old.  The start date was 07/19/2008.  His last INR was 1.3.  Anticoagulation responsible provider: gollan.  INR POC: 2.6.  Cuvette Lot#: [id number].  Exp: 12/2010.    Anticoagulation Management Assessment/Plan:      The patient's current anticoagulation dose is Warfarin sodium 5 mg tabs: take one and a half tabs by mouth daily as directed..  The target INR is 2 - 3.  The next INR is due 01/06/2010.  Anticoagulation instructions were given to patient.  Results were reviewed/authorized by Bethena Midget, RN, BSN.  He was notified by Bethena Midget, RN, BSN.         Prior Anticoagulation Instructions: INR 2.8  Continue on same dosage 1 tablet daily except 1.5 tablets on Tuesdays, Thursdays, and Saturdays.  Recheck in 4 weeks.    Current Anticoagulation Instructions: INR 2.6 Continue 5mg s everyday except 7.5mg s on Tuesdays, Thursdays and Saturdays. Recheck in 4 weeks.

## 2010-02-16 NOTE — Medication Information (Signed)
Summary: CCR/AMD  Anticoagulant Therapy  Managed by: Cloyde Reams, RN, BSN Referring MD: Valera Castle Supervising MD: Mariah Milling Indication 1: Deep Vein Thrombosis - Leg (ICD-451.1) Lab Used: [facility]  Site: [place] INR POC 2.4 INR RANGE 2.0-3.0    Bleeding/hemorrhagic complications: no     Any changes in medication regimen? no     Any missed doses?: no       Is patient compliant with meds? yes       Allergies: 1)  ! Iodine Strong (Iodine Strong (Lugols))  Anticoagulation Management History:      The patient is taking warfarin and comes in today for a routine follow up visit.  Positive risk factors for bleeding include presence of serious comorbidities.  Negative risk factors for bleeding include an age less than 65 years old.  The bleeding index is 'intermediate risk'.  Positive CHADS2 values include History of HTN.  Negative CHADS2 values include Age > 52 years old.  The start date was 07/19/2008.  His last INR was 1.3.  Anticoagulation responsible provider: Gollan.  INR POC: 2.4.  Cuvette Lot#: [id number].  Exp: 08/2010.    Anticoagulation Management Assessment/Plan:      The patient's current anticoagulation dose is Warfarin sodium 5 mg tabs: take one and a half tabs by mouth daily as directed..  The target INR is 2 - 3.  The next INR is due 07/09/2009.  Anticoagulation instructions were given to patient.  Results were reviewed/authorized by Cloyde Reams, RN, BSN.  He was notified by Cloyde Reams RN.         Prior Anticoagulation Instructions: The patient is to continue with the same dose of coumadin.  This dosage includes: coumadin 5 mg daily with 7.5 mg on T Th Sa  Current Anticoagulation Instructions: INR 2.4  Continue on same dosage 5mg  daily except 7.5mg  on Tuesdays, Thursdays, and Saturdays.  Recheck in 4 weeks.

## 2010-02-16 NOTE — Medication Information (Signed)
Summary: ccr   Anticoagulant Therapy  Managed by: Charlena Cross, RN, BSN Referring MD: Valera Castle Supervising MD: Mariah Milling Indication 1: Deep Vein Thrombosis - Leg (ICD-451.1) Lab Used: [facility] Site: [place] INR POC 1.7 INR RANGE 2.0-3.0  Dietary changes: no    Health status changes: no    Bleeding/hemorrhagic complications: no    Recent/future hospitalizations: no    Any changes in medication regimen? no    Recent/future dental: no  Any missed doses?: yes     Details: missed dose last week  Is patient compliant with meds? yes       Allergies: 1)  ! Iodine Strong (Iodine Strong (Lugols))  Anticoagulation Management History:      The patient is taking warfarin and comes in today for a routine follow up visit.  Positive risk factors for bleeding include presence of serious comorbidities.  Negative risk factors for bleeding include an age less than 61 years old.  The bleeding index is 'intermediate risk'.  Positive CHADS2 values include History of HTN.  Negative CHADS2 values include Age > 6 years old.  The start date was 07/19/2008.  His last INR was 1.3.  Anticoagulation responsible provider: Gollan.  INR POC: 1.7.    Anticoagulation Management Assessment/Plan:      The patient's current anticoagulation dose is Warfarin sodium 5 mg tabs: take one and a half tabs by mouth daily as directed..  The target INR is 2 - 3.  The next INR is due 05/14/2009.  Anticoagulation instructions were given to patient.  Results were reviewed/authorized by Charlena Cross, RN, BSN.  He was notified by Charlena Cross, RN, BSN.         Prior Anticoagulation Instructions: coumadin 5 mg daily with 7.5 mg on Tues and Thurs   Current Anticoagulation Instructions: coumadin 10 mg today then coumadin 5 mg daily with 7.5 mg on T Th Sa

## 2010-02-16 NOTE — Assessment & Plan Note (Signed)
Summary: discuss elevated cholesterol/nt   Vital Signs:  Patient profile:   65 year old male Height:      70.25 inches Weight:      241.38 pounds BMI:     34.51 Temp:     98.3 degrees F oral Pulse rate:   68 / minute Pulse rhythm:   regular BP sitting:   102 / 70  (left arm) Cuff size:   large  Vitals Entered By: Linde Gillis CMA Duncan Dull) (August 28, 2009 3:09 PM) CC: elevated cholesterol   History of Present Illness:  65 yo here to discuss elevated lipid panel. Has been on Welchol and Tricor for years, wanted to avoid statins because of possible liver toxicity and because his wife cannot tolerate statins (severe myalgias). His most recent lipid panel this month showed total cholesterol 220, triglycerides 180, HDL 46, LDL 149.   Pt is high risk and I would like to see his LDL less than 100 (preferably less than 70) so I asked him to come in today to discuss.  Spoke with his cardiologist, Dr. Daleen Squibb . He agrees with changing his cholesterol meds, specifically starting a statin.  Current Medications (verified): 1)  Omeprazole 40 Mg Cpdr (Omeprazole) .... Take 1 Tablet Daily. 2)  One-Daily Multivitamins   Tabs (Multiple Vitamin) .... Take 1 Tablet By Mouth Two Times A Day 3)  Wellbutrin Xl 150 Mg  Tb24 (Bupropion Hcl) .Marland Kitchen.. 1 By Mouth Once Daily 4)  Senna Leaves   Leav (Senna) .... 2 Once Daily 5)  Benadryl 25 Mg  Caps (Diphenhydramine Hcl) .... 2 By Mouth At Bedtime As Needed 6)  Xanax Xr 0.5 Mg  Tb24 (Alprazolam) .... Take 1 -2 By Mouth Daily 7)  Claritin 10 Mg Tabs (Loratadine) .... As Needed 8)  Tricor 145 Mg Tabs (Fenofibrate) .Marland Kitchen.. 1 Once Daily For Triglycerides and Cholesterol 9)  Quercetin 50 Mg Tabs (Quercetin) .... Otc Takes  One To Two Daily 10)  Warfarin Sodium 5 Mg Tabs (Warfarin Sodium) .... Take One and A Half Tabs By Mouth Daily As Directed. 11)  Prostaglandin E1  Powd (Alprostadil) .... 40 Micrograms/ml As Needed 12)  Micardis 40 Mg Tabs (Telmisartan) .... Take One  Tablet By Mouth Daily 13)  Fish Oil   Oil (Fish Oil) .... Take 1 or 2 Per Day 14)  Elastamine .... Take 1 Tablet By Mouth Two Times A Day 15)  Lipitor 20 Mg Tabs (Atorvastatin Calcium) .... Take 1 Tab By Mouth Daily  Allergies: 1)  ! Iodine Strong (Iodine Strong (Lugols))  Past History:  Past Medical History: Last updated: 06/26/2008 EDEMA (ICD-782.3) HYPERTENSION (ICD-401.9) HYPERCHOLESTEROLEMIA (ICD-272.0) URI (ICD-465.9) ERECTILE DYSFUNCTION (ICD-302.72) RHINITIS (ICD-477.9) COLONIC POLYPS 2/08 (ICD-211.3) DEPRESSION (ICD-311) ARRHYTHMIA, HX OF 6/98 (ICD-V12.50) SKIN CANCER, HX OF (ICD-V10.83)     Past Surgical History: Last updated: 04/20/2006 Appendectomy age 55 (1967-68) T & A age 64 or 4 Colonoscopy 2/08  Family History: Last updated: 05/21/2009 Father: Alive 90, CABG, age 5; MI, prostate cancer--2009--several falls, angry since wife in nsg home Mother: Alive 47, heart disease (arrhythmia)---09-9/11/09--CVA, L hemiparesis Siblings: No brothers, 1 sister HBP   PGF: died 38 MI   PGM: died 13 ?    MGF:  Died 81's early hardening of arteries MGM:  80+ Low K+ P. uncle - stenosis of carotids (30) P. uncle - died with MI - CABG - Lung CA (38) P. uncle - CABG P. uncle - CABG Sister:  High BP  Social History: Last updated: 10/30/2007  Former Smoker, quit 7-8 years ago Alcohol use-yes, rare Regular exercise-no Marital Status: Married Children: 1 son (30) Occupation: sells outdoor equipment   Risk Factors: Alcohol Use: 1 (10/30/2007) Exercise: no (07/12/2006)  Risk Factors: Smoking Status: quit (07/12/2006) Passive Smoke Exposure: no (10/30/2007)  Review of Systems      See HPI  Physical Exam  General:  muscular, slightly overweight Psych:  Normal affect.   Impression & Recommendations:  Problem # 1:  HYPERCHOLESTEROLEMIA (ICD-272.0) Assessment Deteriorated Time spent with patient 25 minutes, more than 50% of this time was spent counseling  patient on hypercholesterolemia.  Will d/c welchol and start a statin.  Cannot use Simva as pt is taking Tricor.  Will start Lipitor 20 mg and have pt follow up in 6-8 weeks with liver function panel and lipid panel.  The following medications were removed from the medication list:    Welchol 625 Mg Tabs (Colesevelam hcl) .Marland KitchenMarland KitchenMarland KitchenMarland Kitchen 6 tabs once daily His updated medication list for this problem includes:    Tricor 145 Mg Tabs (Fenofibrate) .Marland Kitchen... 1 once daily for triglycerides and cholesterol    Lipitor 20 Mg Tabs (Atorvastatin calcium) .Marland Kitchen... Take 1 tab by mouth daily  Complete Medication List: 1)  Omeprazole 40 Mg Cpdr (Omeprazole) .... Take 1 tablet daily. 2)  One-daily Multivitamins Tabs (Multiple vitamin) .... Take 1 tablet by mouth two times a day 3)  Wellbutrin Xl 150 Mg Tb24 (Bupropion hcl) .Marland Kitchen.. 1 by mouth once daily 4)  Senna Leaves Leav (Senna) .... 2 once daily 5)  Benadryl 25 Mg Caps (Diphenhydramine hcl) .... 2 by mouth at bedtime as needed 6)  Xanax Xr 0.5 Mg Tb24 (Alprazolam) .... Take 1 -2 by mouth daily 7)  Claritin 10 Mg Tabs (Loratadine) .... As needed 8)  Tricor 145 Mg Tabs (Fenofibrate) .Marland Kitchen.. 1 once daily for triglycerides and cholesterol 9)  Quercetin 50 Mg Tabs (Quercetin) .... Otc takes  one to two daily 10)  Warfarin Sodium 5 Mg Tabs (Warfarin sodium) .... Take one and a half tabs by mouth daily as directed. 11)  Prostaglandin E1 Powd (Alprostadil) .... 40 micrograms/ml as needed 12)  Micardis 40 Mg Tabs (Telmisartan) .... Take one tablet by mouth daily 13)  Fish Oil Oil (Fish oil) .... Take 1 or 2 per day 14)  Elastamine  .... Take 1 tablet by mouth two times a day 15)  Lipitor 20 Mg Tabs (Atorvastatin calcium) .... Take 1 tab by mouth daily  Patient Instructions: 1)  Let's stop the Welchol. 2)  Start Lipitor 20 mg daily. 3)  Please make a fasting lab appointment in 6-8 weeks to recheck your cholesterol and liver function (272.4). Prescriptions: LIPITOR 20 MG TABS  (ATORVASTATIN CALCIUM) Take 1 tab by mouth daily  #90 x 3   Entered and Authorized by:   Ruthe Mannan MD   Signed by:   Ruthe Mannan MD on 08/28/2009   Method used:   Print then Give to Patient   RxID:   [id number]   Current Allergies (reviewed today): ! IODINE STRONG (IODINE STRONG (LUGOLS))

## 2010-02-16 NOTE — Medication Information (Signed)
Summary: CCR/AMD   Anticoagulant Therapy  Managed by: Shelby Dubin, PharmD, BCPS, CPP Referring MD: Valera Castle Supervising MD: Daleen Squibb MD, Maisie Fus Indication 1: Deep Vein Thrombosis - Leg (ICD-451.1) Lab Used: [facility] Site: [place] INR POC 2.3  Dietary changes: no    Health status changes: no    Bleeding/hemorrhagic complications: no    Recent/future hospitalizations: no    Any changes in medication regimen? no    Recent/future dental: no  Any missed doses?: no       Is patient compliant with meds? yes       Allergies: 1)  ! Iodine Strong (Iodine Strong (Lugols))  Anticoagulation Management History:      The patient is taking warfarin and comes in today for a routine follow up visit.  Positive risk factors for bleeding include presence of serious comorbidities.  Negative risk factors for bleeding include an age less than 47 years old.  The bleeding index is 'intermediate risk'.  Positive CHADS2 values include History of HTN.  Negative CHADS2 values include Age > 13 years old.  The start date was 07/19/2008.  His last INR was 1.3.  Anticoagulation responsible provider: Daleen Squibb MD, Maisie Fus.  INR POC: 2.3.    Anticoagulation Management Assessment/Plan:      The patient's current anticoagulation dose is Warfarin sodium 5 mg tabs: take one and a half tabs by mouth daily as directed..  The target INR is 2 - 3.  The next INR is due 03/10/2009.  Anticoagulation instructions were given to patient.  Results were reviewed/authorized by Shelby Dubin, PharmD, BCPS, CPP.  He was notified by Charlena Cross, RN, BSN.         Prior Anticoagulation Instructions: coumadin 7.5 mg daily with 5 mg on MWF  Current Anticoagulation Instructions: The patient is to continue with the same dose of coumadin.  This dosage includes: coumadin 7.5 mg daily wtih 5 mg MWF

## 2010-02-16 NOTE — Medication Information (Signed)
Summary: CCR  Anticoagulant Therapy  Managed by: Cloyde Reams, RN, BSN Referring MD: Valera Castle Supervising MD: Mariah Milling Indication 1: Deep Vein Thrombosis - Leg (ICD-451.1) Lab Used: [facility] Site: [place] INR POC 3.2 INR RANGE 2.0-3.0  Dietary changes: no    Health status changes: no    Bleeding/hemorrhagic complications: no    Recent/future hospitalizations: no    Any changes in medication regimen? no    Recent/future dental: no  Any missed doses?: no       Is patient compliant with meds? yes       Allergies: 1)  ! Iodine Strong (Iodine Strong (Lugols))  Anticoagulation Management History:      The patient is taking warfarin and comes in today for a routine follow up visit.  Positive risk factors for bleeding include an age of 66 years or older and presence of serious comorbidities.  The bleeding index is 'intermediate risk'.  Positive CHADS2 values include History of HTN.  Negative CHADS2 values include Age > 66 years old.  The start date was 07/19/2008.  His last INR was 1.3.  Anticoagulation responsible provider: Gollan.  INR POC: 3.2.  Cuvette Lot#: [id number].  Exp: 09/2010.    Anticoagulation Management Assessment/Plan:      The patient's current anticoagulation dose is Warfarin sodium 5 mg tabs: take one and a half tabs by mouth daily as directed..  The target INR is 2 - 3.  The next INR is due 08/12/2009.  Anticoagulation instructions were given to patient.  Results were reviewed/authorized by Cloyde Reams, RN, BSN.  He was notified by Cloyde Reams RN.         Prior Anticoagulation Instructions: INR 2.4  Continue on same dosage 5mg  daily except 7.5mg  on Tuesdays, Thursdays, and Saturdays.  Recheck in 4 weeks.    Current Anticoagulation Instructions: INR 3.2  Skip today's dosage of coumadin, then resume same dosage 1 tablet daily except 1.5 tablets on Tuesdays, Thursdays, and Saturdays.  Recheck in 4 weeks.

## 2010-02-16 NOTE — Medication Information (Signed)
Summary: rov/ewj  Anticoagulant Therapy  Managed by: Cloyde Reams, RN, BSN Referring MD: Valera Castle PCP: Ruthe Mannan MD Supervising MD: Shirlee Latch MD, [place] Indication 1: Deep Vein Thrombosis - Leg (ICD-451.1) Lab Used: [facility] Site: [place] INR POC 2.9 INR RANGE 2.0-3.0  Dietary changes: no    Health status changes: no    Bleeding/hemorrhagic complications: no    Recent/future hospitalizations: no    Any changes in medication regimen? yes       Details: D/C Welchol and started on Lipitor 2-3 weeks ago.  Recent/future dental: no  Any missed doses?: no       Is patient compliant with meds? yes       Allergies: 1)  ! Iodine Strong (Iodine Strong (Lugols))  Anticoagulation Management History:      The patient is taking warfarin and comes in today for a routine follow up visit.  Positive risk factors for bleeding include an age of 19 years or older and presence of serious comorbidities.  The bleeding index is 'intermediate risk'.  Positive CHADS2 values include History of HTN.  Negative CHADS2 values include Age > 21 years old.  The start date was 07/19/2008.  His last INR was 1.3.  Anticoagulation responsible provider: Shirlee Latch MD, [place].  INR POC: 2.9.  Cuvette Lot#: [id number].  Exp: 10/2010.    Anticoagulation Management Assessment/Plan:      The patient's current anticoagulation dose is Warfarin sodium 5 mg tabs: take one and a half tabs by mouth daily as directed..  The target INR is 2 - 3.  The next INR is due 10/14/2009.  Anticoagulation instructions were given to patient.  Results were reviewed/authorized by Cloyde Reams, RN, BSN.  He was notified by Cloyde Reams RN.         Prior Anticoagulation Instructions: INR 1.8  Take 1.5 tablets today, then resume same dosage 1 tablet daily except 1.5 tablets on Tuesdays, Thursdays, and Saturdays.  Recheck in 4 weeks.    Current Anticoagulation Instructions: INR 2.9  Continue on same  dosage 1 tablet daily except 1.5 tablets on Tuesdays, Thursdays, and Saturdays.  Recheck in 4 weeks.

## 2010-02-16 NOTE — Medication Information (Signed)
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 no    Recent/future dental: no  Any missed doses?: no       Is patient compliant with meds? yes       Current Medications (verified): 1)  Zegerid 40-1100 Mg Caps (Omeprazole-Sodium Bicarbonate) .... Take One By Mouth Daily 2)  Welchol 625 Mg  Tabs (Colesevelam Hcl) .... 6 Tabs Once Daily 3)  Flax Seed Oil   Caps (Flaxseed (Linseed) Caps) .... Take One Two Times A Day 4)  One-Daily Multivitamins   Tabs (Multiple Vitamin) .... Take 1 Tablet By Mouth Two Times A Day 5)  Wellbutrin Xl 150 Mg  Tb24 (Bupropion Hcl) .Marland Kitchen.. 1 By Mouth Once Daily 6)  Senna Leaves   Leav (Senna) .... 2 Once Daily 7)  Benadryl 25 Mg  Caps (Diphenhydramine Hcl) .... 2 By Mouth At Bedtime As Needed 8)  Xanax Xr 0.5 Mg  Tb24 (Alprazolam) .... Take 1 -2 By Mouth Daily 9)  Claritin 10 Mg Tabs (Loratadine) .... As Needed 10)  Tricor 145 Mg Tabs (Fenofibrate) .Marland Kitchen.. 1 Once Daily For Triglycerides and Cholesterol 11)  Quercetin 50 Mg Tabs (Quercetin) .... Otc Takes  One To Two Daily 12)  Warfarin Sodium 5 Mg Tabs (Warfarin Sodium) .... Take One and A Half Tabs By Mouth Daily As Directed. 13)  Prostaglandin E1  Powd (Alprostadil) .... 66mcg/ml Injection As Needed 14)  Micardis 40 Mg Tabs (Telmisartan) .... Take One Tablet By Mouth Daily  Allergies: 1)  ! Iodine Strong (Iodine Strong (Lugols))  Anticoagulation Management History:      The patient is taking warfarin and comes in today for a routine follow up visit.  Positive risk factors for bleeding include  presence of serious comorbidities.  Negative risk factors for bleeding include an age less than 16 years old.  The bleeding index is 'intermediate risk'.  Positive CHADS2 values include History of HTN.  Negative CHADS2 values include Age > 47 years old.  The start date was 07/19/2008.  His last INR was 1.3.  Anticoagulation responsible provider: Solimar Maiden.  INR POC: 3.3.    Anticoagulation Management Assessment/Plan:      The patient's current anticoagulation dose is Warfarin sodium 5 mg tabs: take one and a half tabs by mouth daily as directed..  The target INR is 2 - 3.  The next INR is due 04/21/2009.  Anticoagulation instructions were given to patient.  Results were reviewed/authorized by Charlena Cross, RN, BSN.  He was notified by Mercer Pod.         Prior Anticoagulation Instructions: The patient is to continue with the same dose of coumadin.  This dosage includes: coumadin 7.5 mg daily with 5 mg on MWF   Current Anticoagulation Instructions: coumadin 5 mg daily with 7.5 mg on Tues and Thurs

## 2010-02-16 NOTE — Medication Information (Signed)
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 no    Recent/future dental: no  Any missed doses?: no       Is patient compliant with meds? yes       Allergies: 1)  ! Iodine Strong (Iodine Strong (Lugols))  Anticoagulation Management History:      The patient is taking warfarin and comes in today for a routine follow up visit.  Positive risk factors for bleeding include an age of 21 years or older and presence of serious comorbidities.  The bleeding index is 'intermediate risk'.  Positive CHADS2 values include History of HTN.  Negative CHADS2 values include Age > 1 years old.  The start date was 07/19/2008.  His last INR was 1.3.  Anticoagulation responsible provider: Shirlee Latch MD, Dalton.  INR POC: 2.1.  Cuvette Lot#: [id number].  Exp: 11/2010.    Anticoagulation Management Assessment/Plan:      The patient's current anticoagulation dose is Warfarin sodium 5 mg tabs: take one and a half tabs by mouth daily as directed..  The target INR is 2 - 3.  The next INR is due 11/11/2009.  Anticoagulation instructions were given to patient.  Results were reviewed/authorized by Cloyde Reams, RN, BSN.  He was notified by Cloyde Reams RN.         Prior Anticoagulation Instructions: INR 2.9  Continue on same dosage 1 tablet daily except 1.5 tablets on Tuesdays, Thursdays, and Saturdays.  Recheck in 4 weeks.    Current Anticoagulation Instructions: INR 2.1  Continue on same dosage 1 tablet daily except 1.5 tablets on Tuesdays, Thursdays, and Saturdays.  Recheck  in 4 weeks.

## 2010-02-16 NOTE — Assessment & Plan Note (Signed)
Summary: flu shot   Nurse Visit   Allergies: 1)  ! Iodine Strong (Iodine Strong (Lugols))  Orders Added: 1)  Flu Vaccine 68yrs + MEDICARE PATIENTS [Q2039] 2)  Administration Flu vaccine - MCR [G0008]  Flu Vaccine Consent Questions     Do you have a history of severe allergic reactions to this vaccine? no    Any prior history of allergic reactions to egg and/or gelatin? no    Do you have a sensitivity to the preservative Thimersol? no    Do you have a past history of Guillan-Barre Syndrome? no    Do you currently have an acute febrile illness? no    Have you ever had a severe reaction to latex? no    Vaccine information given and explained to patient? yes    Are you currently pregnant? no    Lot Number:[id number]   Exp Date:07/17/2010   Site Given  Left Deltoid IM

## 2010-02-16 NOTE — Progress Notes (Signed)
Summary: Rx Xanax  Phone Note Refill Request Call back at [phone] Message from:  [facility] on Jun 01, 2009 4:25 PM  Refills Requested: Medication #1:  XANAX XR 0.5 MG  TB24 Take 1 -2 by mouth daily   Last Refilled: 05/03/2009 Received faxed refill request please advise.   Method Requested: Telephone to Pharmacy Initial call taken by: Linde Gillis CMA Duncan Dull),  Jun 01, 2009 4:26 PM  Follow-up for Phone Call        Rx called to pharmacy Follow-up by: Linde Gillis CMA Duncan Dull),  Jun 01, 2009 4:43 PM    Prescriptions: XANAX XR 0.5 MG  TB24 (ALPRAZOLAM) Take 1 -2 by mouth daily  #60 x 3   Entered and Authorized by:   Ruthe Mannan MD   Signed by:   Ruthe Mannan MD on 06/01/2009   Method used:   Telephoned to ...       [facility] (mail-order)       [address] [place], [place]  [address]       Ph: [phone]       Fax: [phone]   RxID:   [id number]

## 2010-02-16 NOTE — Assessment & Plan Note (Signed)
Summary: rov  Medications Added BENICAR 40 MG TABS (OLMESARTAN MEDOXOMIL) Take one tablet by mouth daily      Allergies Added:   Visit Type:  Follow-up  CC:  LE edema without compression hose and SOB with exertional activity.  History of Present Illness: CG returns today for evaluation management of his right lower extremity chronic venoocclusive disease and lower extremity edema. He has now been on Coumadin for about 6 months. Repeat Dopplers December 14 showed persistent clot in the right lower extremity. Please see detailed report.  He wears support nose which he says helps a lot. He is very compliant with his medications. His edema has been better. His blood pressure got a good control. He denies any pleuritic chest pain or hemoptysis. He's had no significant shortness of breath  Current Medications (verified): 1)  Zegerid 40-1100 Mg Caps (Omeprazole-Sodium Bicarbonate) .... Take One By Mouth Daily 2)  Welchol 625 Mg  Tabs (Colesevelam Hcl) .... 6 Tabs Once Daily 3)  Flax Seed Oil   Caps (Flaxseed (Linseed) Caps) .... Take One Two Times A Day 4)  One-Daily Multivitamins   Tabs (Multiple Vitamin) .... Take 1 Tablet By Mouth Two Times A Day 5)  Wellbutrin Xl 150 Mg  Tb24 (Bupropion Hcl) .Marland Kitchen.. 1 By Mouth Once Daily 6)  Senna Leaves   Leav (Senna) .... 2 Once Daily 7)  Benadryl 25 Mg  Caps (Diphenhydramine Hcl) .... 2 By Mouth At Bedtime As Needed 8)  Xanax Xr 0.5 Mg  Tb24 (Alprazolam) .... Take 1 -2 By Mouth Daily 9)  Claritin 10 Mg Tabs (Loratadine) .... As Needed 10)  Tricor 145 Mg Tabs (Fenofibrate) .Marland Kitchen.. 1 Once Daily For Triglycerides and Cholesterol 11)  Quercetin 50 Mg Tabs (Quercetin) .... Otc Takes  One To Two Daily 12)  Warfarin Sodium 5 Mg Tabs (Warfarin Sodium) .... Take One and A Half Tabs By Mouth Daily As Directed. 13)  Prostaglandin E1  Powd (Alprostadil) .... 65mcg/ml Injection As Needed 14)  Benicar 40 Mg Tabs (Olmesartan Medoxomil) .... Take One Tablet By Mouth  Daily  Allergies (verified): 1)  ! Iodine Strong (Iodine Strong (Lugols))  Past History:  Past Medical History: Last updated: 06/26/2008 EDEMA (ICD-782.3) HYPERTENSION (ICD-401.9) HYPERCHOLESTEROLEMIA (ICD-272.0) URI (ICD-465.9) ERECTILE DYSFUNCTION (ICD-302.72) RHINITIS (ICD-477.9) COLONIC POLYPS 2/08 (ICD-211.3) DEPRESSION (ICD-311) ARRHYTHMIA, HX OF 6/98 (ICD-V12.50) SKIN CANCER, HX OF (ICD-V10.83)     Past Surgical History: Last updated: 04/20/2006 Appendectomy age 89 (1967-68) T & A age 27 or 4 Colonoscopy 2/08  Family History: Last updated: 10/30/2007 Father: Alive 56, CABG, age 76; MI, prostate cancer--2009--several falls, angry since wife in nsg home Mother: Alive 48, heart disease (arrhythmia)---09-9/11/09--CVA, L hemiparesis Siblings: No brothers, 1 sister HBP   PGF: died 65 MI   PGM: died 71 ?    MGF:  Died 34's early hardening of arteries MGM:  80+ Low K+ P. uncle - stenosis of carotids (76) P. uncle - died with MI - CABG - Lung CA (9) P. uncle - CABG P. uncle - CABG Sister:  High BP  Social History: Last updated: 10/30/2007 Former Smoker, quit 7-8 years ago Alcohol use-yes, rare Regular exercise-no Marital Status: Married Children: 1 son (30) Occupation: sells outdoor equipment   Risk Factors: Alcohol Use: 1 (10/30/2007) Exercise: no (07/12/2006)  Risk Factors: Smoking Status: quit (07/12/2006) Passive Smoke Exposure: no (10/30/2007)  Review of Systems       negative other than history of present illness  Vital Signs:  Patient profile:  65 year old male Weight:      244.50 pounds Pulse rate:   60 / minute Pulse rhythm:   regular BP sitting:   138 / 88  (right arm) Cuff size:   large  Vitals Entered By: Charlena Cross, RN, BSN (January 20, 2009 11:00 AM)  Physical Exam  General:  muscular, slightly overweight Head:  normocephalic and atraumatic Eyes:  PERRLA/EOM intact; conjunctiva and lids normal. Mouth:  Teeth, gums  and palate normal. Oral mucosa normal. Neck:  Neck supple, no JVD. No masses, thyromegaly or abnormal cervical nodes. Lungs:  Clear bilaterally to auscultation and percussion. Heart:  Non-displaced PMI, chest non-tender; regular rate and rhythm, S1, S2 without murmurs, rubs or gallops. Carotid upstroke normal, no bruit. Normal abdominal aortic size, no bruits. Femorals normal pulses, no bruits. Pedals normal pulses. No edema, no varicosities. Msk:  Back normal, normal gait. Muscle strength and tone normal. Pulses:  pulses normal in all 4 extremities Extremities:  trace left pedal edema and trace right pedal edema. varicose vein, no sign of DVT  Neurologic:  Alert and oriented x 3. Skin:  Intact without lesions or rashes. Psych:  Normal affect.   Impression & Recommendations:  Problem # 1:  DEEP VENOUS THROMBOPHLEBITIS, LEG, RIGHT (ICD-453.40) Assessment Unchanged I have reviewed the lower yrd.emity venous ultrasound at length with Mr. Milstein. At this point we will remain on Coumadin probably for life. With his history of standing for long periods of time on concrete, and the risk of pulmonary embolus and recurrent DVT, I think this is the best option. He is tolerating the blood thinning well and is fine with this plan.  Problem # 2:  COUMADIN THERAPY (ICD-V58.61) Assessment: Unchanged  Problem # 3:  HYPERTENSION (ICD-401.9) Assessment: Unchanged  The following medications were removed from the medication list:    Micardis 40 Mg Tabs (Telmisartan) .Marland Kitchen... 1 once daily for bp    Hydrochlorothiazide 25 Mg Tabs (Hydrochlorothiazide) ..... One daily as needed.    Lasix 20 Mg Tabs (Furosemide) .Marland Kitchen... Take one tablet each morning for edema His updated medication list for this problem includes:    Benicar 40 Mg Tabs (Olmesartan medoxomil) .Marland Kitchen... Take one tablet by mouth daily  Problem # 4:  EDEMA LEG (ICD-782.3) Assessment: Improved

## 2010-02-16 NOTE — Letter (Signed)
Summary: Generic Letter  [place] [facility]  [address] [place], [place] [address]   Phone: [phone]  Fax: [phone]    10/28/2009  KISHON GARRIGA [address] [place], [place]  [address]  Dear Mr. Conigliaro,     We have received your lab results and Dr. Dayton Martes says that your labs look great!  Good cholesterol, (HDL) is barely low, otherwise all normal.    Sincerely,        Linde Gillis CMA (AAMA)

## 2010-02-16 NOTE — Medication Information (Signed)
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 no    Recent/future dental: no  Any missed doses?: no       Is patient compliant with meds? yes       Allergies: 1)  ! Iodine Strong (Iodine Strong (Lugols))  Anticoagulation Management History:      The patient is taking warfarin and comes in today for a routine follow up visit.  Positive risk factors for bleeding include presence of serious comorbidities.  Negative risk factors for bleeding include an age less than 31 years old.  The bleeding index is 'intermediate risk'.  Positive CHADS2 values include History of HTN.  Negative CHADS2 values include Age > 85 years old.  The start date was 07/19/2008.  His last INR was 1.3.  Anticoagulation responsible provider: Shirlee Latch MD, Dalton.  INR POC: 2.6.    Anticoagulation Management Assessment/Plan:      The patient's current anticoagulation dose is Warfarin sodium 5 mg tabs: take one and a half tabs by mouth daily as directed..  The target INR is 2 - 3.  The next INR is due 06/11/2009.  Anticoagulation instructions were given to patient.  Results were reviewed/authorized by Charlena Cross, RN, BSN.  He was notified by Charlena Cross, RN, BSN.         Prior Anticoagulation Instructions: coumadin 10 mg today then coumadin 5 mg daily with 7.5 mg on T Th Sa  Current Anticoagulation Instructions: The patient is to continue with the same dose of coumadin.  This dosage includes: coumadin 5 mg daily with 7.5 mg on T Th Sa

## 2010-02-17 ENCOUNTER — Encounter (INDEPENDENT_AMBULATORY_CARE_PROVIDER_SITE_OTHER): Payer: Medicare Other

## 2010-02-17 ENCOUNTER — Ambulatory Visit: Admit: 2010-02-17 | Payer: Self-pay

## 2010-02-17 ENCOUNTER — Encounter: Payer: Self-pay | Admitting: Cardiovascular Disease

## 2010-02-17 DIAGNOSIS — I80299 Phlebitis and thrombophlebitis of other deep vessels of unspecified lower extremity: Secondary | ICD-10-CM

## 2010-02-17 DIAGNOSIS — Z7901 Long term (current) use of anticoagulants: Secondary | ICD-10-CM

## 2010-02-17 LAB — CONVERTED CEMR LAB: POC INR: 2.9

## 2010-02-18 NOTE — Medication Information (Signed)
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 no    Recent/future dental: no  Any missed doses?: no       Is patient compliant with meds? yes       Allergies: 1)  ! Iodine Strong (Iodine Strong (Lugols))  Anticoagulation Management History:      The patient is taking warfarin and comes in today for a routine follow up visit.  Positive risk factors for bleeding include an age of 66 years or older and presence of serious comorbidities.  The bleeding index is 'intermediate risk'.  Positive CHADS2 values include History of HTN.  Negative CHADS2 values include Age > 59 years old.  The start date was 07/19/2008.  His last INR was 1.3.  Anticoagulation responsible provider: Almeta Geisel.  Cuvette Lot#: [id number].  Exp: 12/2010.    Anticoagulation Management Assessment/Plan:      The patient's current anticoagulation dose is Warfarin sodium 5 mg tabs: take one and a half tabs by mouth daily as directed..  The target INR is 2 - 3.  The next INR is due 02/10/2010.  Anticoagulation instructions were given to patient.  Results were reviewed/authorized by Cloyde Reams, RN, BSN.  He was notified by Cloyde Reams RN.         Prior Anticoagulation Instructions: INR 2.6 Continue 5mg s everyday except 7.5mg s on Tuesdays, Thursdays and Saturdays. Recheck in 4 weeks.   Current Anticoagulation Instructions: INR 3.2  Skip today's dosage of Coumadin, then resume same dosage 1 tablet daily except 1.5 tablets on Tuesdays, Thursdays, and Saturdays.  Recheck in 4 weeks.

## 2010-02-18 NOTE — Progress Notes (Signed)
Summary: xanax   Phone Note Refill Request Message from:  Fax from Pharmacy on January 26, 2010 10:03 AM  Refills Requested: Medication #1:  XANAX XR 0.5 MG  TB24 Take 1 -2 by mouth daily   Last Refilled: [birth year] 1944 Refill request from [facility]. [phone]  Initial call taken by: Melody Comas,  January 26, 2010 10:03 AM  Follow-up for Phone Call        Rx called to pharmacy Follow-up by: Linde Gillis CMA Duncan Dull),  January 26, 2010 10:31 AM    Prescriptions: XANAX XR 0.5 MG  TB24 (ALPRAZOLAM) Take 1 -2 by mouth daily  #60 x 3   Entered and Authorized by:   Ruthe Mannan MD   Signed by:   Ruthe Mannan MD on 01/26/2010   Method used:   Telephoned to ...       [facility] (mail-order)       [address] [place], [place]  [address]       Ph: [phone]       Fax: [phone]   RxID:   [id number]

## 2010-02-24 NOTE — Medication Information (Signed)
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 no    Recent/future dental: no  Any missed doses?: no       Is patient compliant with meds? yes       Allergies: 1)  ! Iodine Strong (Iodine Strong (Lugols))  Anticoagulation Management History:      The patient is taking warfarin and comes in today for a routine follow up visit.  Positive risk factors for bleeding include an age of 58 years or older and presence of serious comorbidities.  The bleeding index is 'intermediate risk'.  Positive CHADS2 values include History of HTN.  Negative CHADS2 values include Age > 74 years old.  The start date was 07/19/2008.  His last INR was 1.3.  Anticoagulation responsible provider: gollan.  INR POC: 2.9.  Cuvette Lot#: [id number].  Exp: 02/2011.    Anticoagulation Management Assessment/Plan:      The patient's current anticoagulation dose is Warfarin sodium 5 mg tabs: take one and a half tabs by mouth daily as directed..  The target INR is 2 - 3.  The next INR is due 03/17/2010.  Anticoagulation instructions were given to patient.  Results were reviewed/authorized by Cloyde Reams, RN, BSN.  He was notified by Cloyde Reams RN.         Prior Anticoagulation Instructions: INR 3.2  Skip today's dosage of Coumadin, then resume same dosage 1 tablet daily except 1.5 tablets on Tuesdays, Thursdays, and Saturdays.  Recheck in 4 weeks.    Current Anticoagulation Instructions: INR 2.9  Continue on same dosage 1 tablet daily except 1.5 tablets on Tuesdays,  Thursdays, and Saturdays.  Recheck in 4 weeks.

## 2010-03-17 ENCOUNTER — Encounter (INDEPENDENT_AMBULATORY_CARE_PROVIDER_SITE_OTHER): Payer: Medicare Other

## 2010-03-17 ENCOUNTER — Encounter: Payer: Self-pay | Admitting: Cardiovascular Disease

## 2010-03-17 DIAGNOSIS — Z7901 Long term (current) use of anticoagulants: Secondary | ICD-10-CM

## 2010-03-17 DIAGNOSIS — I80299 Phlebitis and thrombophlebitis of other deep vessels of unspecified lower extremity: Secondary | ICD-10-CM

## 2010-03-25 NOTE — Medication Information (Signed)
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 no    Recent/future dental: no  Any missed doses?: no       Is patient compliant with meds? yes       Allergies: 1)  ! Iodine Strong (Iodine Strong (Lugols))  Anticoagulation Management History:      The patient is taking warfarin and comes in today for a routine follow up visit.  Positive risk factors for bleeding include an age of 23 years or older and presence of serious comorbidities.  The bleeding index is 'intermediate risk'.  Positive CHADS2 values include History of HTN.  Negative CHADS2 values include Age > 57 years old.  The start date was 07/19/2008.  His last INR was 1.3.  Anticoagulation responsible provider: gollan.  INR POC: 2.6.  Cuvette Lot#: [id number].  Exp: 01/2011.    Anticoagulation Management Assessment/Plan:      The patient's current anticoagulation dose is Warfarin sodium 5 mg tabs: take one and a half tabs by mouth daily as directed..  The target INR is 2 - 3.  The next INR is due 04/14/2010.  Anticoagulation instructions were given to patient.  Results were reviewed/authorized by Cloyde Reams, RN, BSN.  He was notified by Cloyde Reams RN.         Prior Anticoagulation Instructions: INR 2.9  Continue on same dosage 1 tablet daily except 1.5 tablets on Tuesdays, Thursdays, and Saturdays.  Recheck in 4 weeks.    Current Anticoagulation Instructions: INR 2.6  Continue on same dosage 1 tablet daily except 1.5 tablets on Tuesdays, Thursdays, and Saturdays.  Recheck in 4 weeks.

## 2010-04-06 ENCOUNTER — Encounter: Payer: Self-pay | Admitting: Cardiology

## 2010-04-06 DIAGNOSIS — I82409 Acute embolism and thrombosis of unspecified deep veins of unspecified lower extremity: Secondary | ICD-10-CM

## 2010-04-06 DIAGNOSIS — Z7901 Long term (current) use of anticoagulants: Secondary | ICD-10-CM | POA: Insufficient documentation

## 2010-04-06 HISTORY — DX: Acute embolism and thrombosis of unspecified deep veins of unspecified lower extremity: I82.409

## 2010-04-14 ENCOUNTER — Encounter: Payer: Medicare Other | Admitting: Emergency Medicine

## 2010-04-21 ENCOUNTER — Ambulatory Visit (INDEPENDENT_AMBULATORY_CARE_PROVIDER_SITE_OTHER): Payer: Medicare Other | Admitting: Emergency Medicine

## 2010-04-21 DIAGNOSIS — Z7901 Long term (current) use of anticoagulants: Secondary | ICD-10-CM

## 2010-04-21 DIAGNOSIS — I82409 Acute embolism and thrombosis of unspecified deep veins of unspecified lower extremity: Secondary | ICD-10-CM

## 2010-04-21 LAB — POCT INR: INR: 3.1

## 2010-04-21 NOTE — Patient Instructions (Signed)
Take 1/2 tablet tonight, then resume same dosage 1 tablet daily except 1.5 tablets on Tuesdays, Thursdays, and Saturdays.  Recheck in 4 weeks.

## 2010-05-18 ENCOUNTER — Ambulatory Visit: Payer: Self-pay | Admitting: Internal Medicine

## 2010-05-19 ENCOUNTER — Ambulatory Visit (INDEPENDENT_AMBULATORY_CARE_PROVIDER_SITE_OTHER): Payer: Medicare Other | Admitting: Emergency Medicine

## 2010-05-19 DIAGNOSIS — Z7901 Long term (current) use of anticoagulants: Secondary | ICD-10-CM

## 2010-05-19 DIAGNOSIS — I82409 Acute embolism and thrombosis of unspecified deep veins of unspecified lower extremity: Secondary | ICD-10-CM

## 2010-05-20 ENCOUNTER — Observation Stay: Payer: Self-pay | Admitting: Internal Medicine

## 2010-05-20 ENCOUNTER — Ambulatory Visit (INDEPENDENT_AMBULATORY_CARE_PROVIDER_SITE_OTHER): Payer: Medicare Other | Admitting: Family Medicine

## 2010-05-20 ENCOUNTER — Encounter: Payer: Self-pay | Admitting: Family Medicine

## 2010-05-20 ENCOUNTER — Ambulatory Visit: Payer: Self-pay | Admitting: Family Medicine

## 2010-05-20 VITALS — BP 110/70 | HR 71 | Temp 98.6°F | Ht 70.25 in | Wt 240.8 lb

## 2010-05-20 DIAGNOSIS — M25561 Pain in right knee: Secondary | ICD-10-CM

## 2010-05-20 DIAGNOSIS — W57XXXA Bitten or stung by nonvenomous insect and other nonvenomous arthropods, initial encounter: Secondary | ICD-10-CM

## 2010-05-20 DIAGNOSIS — M25569 Pain in unspecified knee: Secondary | ICD-10-CM

## 2010-05-20 NOTE — Assessment & Plan Note (Addendum)
New.  On coumadin so less likely that this is a DVT and already being treated.   However, exam is a little concerning for DVT, will send for immediate doppler. Will also refer to ortho ask this is most likely MSK/internal derangement.

## 2010-05-20 NOTE — Progress Notes (Signed)
64 yo here for: 1.  Tick bite- noted a tick on his groin 3 days ago, pulled it off and now has a small red area.  No fevers, chills, myalgias, rash or headache.    2.  Right knee pain- pt developed right knee and thigh pain a few days ago, getting progressively worse. No known injury.  Feels his knee is a little swollen.  Pain with weight bearing, some mild erythema of right calf. 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 Reports this pain is similar to his DVT pain.   The PMH, PSH, Social History, Family History, Medications, and allergies have been reviewed in [facility], and have been updated if relevant.   Review of Systems       See HPI General:  Denies chills and fever. Eyes:  Denies blurring. ENT:  Denies difficulty swallowing. CV:  Denies chest pain or discomfort and difficulty breathing at night. Resp:  Denies shortness of breath. GI:  Denies abdominal pain and bloody stools. GU:  Denies dysuria, erectile dysfunction, incontinence, nocturia, urinary frequency, and urinary hesitancy. MS:  Denies muscle aches. Derm:  Denies rash. Neuro:  Denies headaches. Psych:  Denies anxiety and depression. Endo:  Denies cold intolerance and heat intolerance.  Physical Exam BP 110/70  Pulse 71  Temp(Src) 98.6 F (37 C) (Oral)  Ht 5' 10.25" (1.784 m)  Wt 240 lb 12.8 oz (109.226 kg)  BMI 34.31 kg/m2 General:  muscular, slightly overweight Head:  normocephalic and atraumatic Eyes:  PERRLA/EOM intact; conjunctiva and lids normal. Ears:  R ear normal and L ear normal.   Nose:  mucosal erythema and mucosal edema, boggy, no airflow obstruction, sinuses neg Mouth:  Teeth, gums and palate normal. Oral mucosa normal. Neck:  Neck supple, no JVD. No masses, thyromegaly or abnormal cervical nodes. Breasts:  No masses or gynecomastia noted Lungs:  Clear bilaterally to auscultation and percussion. Heart:  Non-displaced PMI, chest non-tender; regular rate and irregular rhythm, S1, S2  without murmurs, rubs or gallops.  Abdomen:  soft, non-tender, normal bowel sounds, no distention, no masses, no guarding, no abdominal hernia, no inguinal hernia, no hepatomegaly, and no splenomegaly.   Extremities:  Pain with weight bearing on right leg, mild swelling behind right knee, mild pain with squeezing right calf, symmetrical pedal pulses bilaterally (diminshed but present).  Neg lachmans.  No effusion  Psych:  Normal affect. Skin:  Small circular erythematous, raised area on groin, no central clearing. No other rashes.

## 2010-05-20 NOTE — Assessment & Plan Note (Signed)
New. Does not appear infected and no consistent with Lyme rash. Reassurance provided, red flags given requiring immediate follow up.

## 2010-05-21 ENCOUNTER — Telehealth: Payer: Self-pay | Admitting: Internal Medicine

## 2010-05-21 NOTE — Telephone Encounter (Signed)
Received ultrasound report positive for DVT Spoke with Dr Marella Chimes was aware and patient was admitted to [facility] already

## 2010-05-21 NOTE — Telephone Encounter (Signed)
Thank you.  I was aware and sent pt to [facility] yesterday. Nikki, please call to check on him.

## 2010-05-24 NOTE — Telephone Encounter (Signed)
I spoke with Anthony Kim late Friday evening and received a voicemail from him yesterday. He was discharged yesterday and doing well.  Will follow up as outpatient with vascular as they think the DVTs were old, not new. Also will touch base with Dr. Daleen Squibb per pt's message.

## 2010-05-25 ENCOUNTER — Encounter: Payer: Self-pay | Admitting: Family Medicine

## 2010-05-26 ENCOUNTER — Ambulatory Visit (INDEPENDENT_AMBULATORY_CARE_PROVIDER_SITE_OTHER): Payer: Medicare Other | Admitting: Emergency Medicine

## 2010-05-26 DIAGNOSIS — I82409 Acute embolism and thrombosis of unspecified deep veins of unspecified lower extremity: Secondary | ICD-10-CM

## 2010-05-26 DIAGNOSIS — Z7901 Long term (current) use of anticoagulants: Secondary | ICD-10-CM

## 2010-06-01 NOTE — Assessment & Plan Note (Signed)
[facility] OFFICE NOTE   ZEDDIE, NJIE                      MRN:          [id number]  DATE:10/09/2006                            DOB:          09/20/1945    The patient comes in today because of increased lower extremity edema.   He is a pleasant 65 year old gentleman who I have seen in the past for  palpitations, hypertension and hyperlipidemia.   Over the last several months he has had increasing lower extremity  edema.  He has gotten to the point that sometimes he cannot get his  shoes on.   He has been on a diuretic ARB combination in the form of Benicar 40/12.5  mg daily.  He has been on this for several weeks.  He has noticed a  little bit of improvement.   He denies any orthopnea, PND, or any dyspnea on exertion.  He has had no  symptoms of angina or ischemic equivalency.   He is very careful with salt.  His blood pressure seems to have been  under pretty good control.   ALLERGIES:  No known drug allergies.   CURRENT MEDICATIONS:  1. Benicar hydrochlorothiazide 40/12.5 daily.  2. Wellbutrin XL 150 mg daily.  3. Multivitamin daily.  4. Vanquish two daily.  5. Welchol 625 mg six tablets a day.  6. Fluticasone 50 mg daily.  7. Fish Oil 1000 mg daily.  8. Zyrtec 10 mg a day.  9. Zegerid 40 mg daily.  10.Alprazolam XR 1 mg daily.  11.Tribulus 625 mg two daily.  12.Panax Ginseng 520 mg two daily.  13.Chromium 400 mg daily.  14.Ashwagandha 450 mg two daily.   SOCIAL HISTORY:  He is married.  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  He quit smoking in  1991.  He did smoke a pack a day for 30 years.   FAMILY HISTORY:  His father has coronary artery disease, but he was well  over the age of 16 at the time of his first acute MI.  He is a patient  of mine.   He has a history of gastroesophageal reflux disease.  He has a history  of  allergies.   REVIEW OF SYSTEMS:  Otherwise negative other than the HPI.   PHYSICAL EXAMINATION:  VITAL SIGNS:  Blood pressure 132/86, pulse 69 and  regular.  His EKG is normal.  Weight is 233 which is up 12 pounds since  2001 when we last saw him.  HEENT:  Normocephalic and atraumatic.  PERRLA.  Extraocular movements  intact.  Sclerae clear.  Facial symmetry is normal.  NECK:  No JVD.  Carotids are full.  There is no bruits.  Thyroid is not  enlarged.  Trachea is midline.  LUNGS:  Clear to auscultation.  HEART:  Poorly appreciated PMI that does not appear to be displaced.  Normal S1 and S2 without gallop.  ABDOMEN:  Slightly protuberant with good bowel sounds.  Organomegaly was  not obvious.  There was no obvious hepatojugular reflux.  EXTREMITIES:  1+ pitting edema on the left, a little bit more on the  right.  He has some superficial varicose veins.  There is no signs of  DVT.  Pulses were 2+/4+ both dorsalis pedis and posterior tibial.  NEUROLOGY:  Grossly intact.   ASSESSMENT:  1. Lower extremity edema.  I suspect much of this is from 15-16 hour      work days mostly on his feet on concrete.  He has some varicose      veins superficially which are probably present in the deep veins as      well.  2. Hypertension which seems to have been under good control.  This      could be contributing to #1 as well.  3. Mild obesity.  This could also be contributing to #1.   PLAN:  1. Increase Benicar hydrochlorothiazide to 40/25 mg daily.  2. Continue being careful with salt.  3. Two-dimensional echocardiogram to assess cardiac function.   I will see him back in about 3-4 weeks.     Thomas C. Daleen Squibb, MD, [facility]  Electronically Signed    TCW/MedQ  DD: 10/09/2006  DT: 10/09/2006  Job #: [id number]   cc:   Billie D. Bean, FNP

## 2010-06-01 NOTE — Assessment & Plan Note (Signed)
[facility] OFFICE NOTE   Anthony Kim, Anthony Kim                      MRN:          [id number]  DATE:11/09/2006                            DOB:          [birth year]    SUBJECTIVE:  [name] Comes in today for followup.  I saw him for  increasing lower extremity edema on October 09, 2006.   His 2-D echocardiogram suggested a shadow in the ascending aorta.  He  was sent over to [facility] that day for a CT scan which did not show  any aneurysm or dissection.   Of note, his 2-D echocardiogram was completely normal.  There was no  evidence of left ventricular hypertrophy and his right sided function  and pressures were normal.   Since increasing his Benicar to 40/25 from 40/12.5, his swelling has  improved. His weight is really unchanged, however, his shoes are easier  to get on and he is not having any discomfort.   He still has to stand for prolonged periods of time on concrete but he  is also putting his feet up at night.  He has been so busy with his  parents and their healthcare that he really has not had time to walk.   PHYSICAL EXAMINATION:  VITAL SIGNS:  His blood pressure today is 110/78.  His pulse is 72 and regular.  The rest of his exam is unchanged.   ASSESSMENT:  1. Lower extremity edema secondary to prolonged standing for a number      of years.  2. Hypertension.  3. Varicose veins.  4. Weight.   DISCUSSION:  I have reinforced all the things from our previous visit.  I will see him back on a p.r.n. basis.  He will follow along with Dr.  Laurita Kim.     Anthony C. Daleen Squibb, MD, [facility]  Electronically Signed    TCW/MedQ  DD: 11/09/2006  DT: 11/10/2006  Job #: [id number]   cc:   Anthony Silence, MD

## 2010-06-01 NOTE — Assessment & Plan Note (Signed)
[facility]                                 ON-CALL NOTE   [name], [name]                      MRN:          [id number]  DATE:10/17/2006                            DOB:          1945-12-28    PRIMARY CARDIOLOGIST:  Dr. Valera Castle.   I received a telephone call from Dr. Devoria Albe in the [facility]  Emergency Department regarding a CT scan of the chest performed today on  [name].  The patient had undergone a routine echocardiogram  this morning in our [facility] revealing a linear shadow within  the aortic arch that was concerning for a possible aortic dissection.  He reportedly had no symptoms at that time, although was contacted by  our office staff and referred to the emergency department for a more  definitive diagnosis.  The formal report is not yet available, however,  Dr. Lynelle Doctor tells me that the CT scan of the chest revealed no evidence of  aortic dissection, no pulmonary embolism, and incidentally noted  gallstones.  The patient was also noted to be asymptomatic per Dr.  Delford Field report.  His blood work, including his CBC, coagulations  studies, and BMET were normal.  Dr. Lynelle Doctor planned to discharge the  patient home, and we asked that he keep his regular followup appointment  Dr. Daleen Squibb.     Jonelle Sidle, MD  Electronically Signed    SGM/MedQ  DD: 10/17/2006  DT: 10/18/2006  Job #: [id number]   cc:   Jesse Sans. Wall, MD, [facility]

## 2010-06-04 NOTE — Assessment & Plan Note (Signed)
[facility]                         GASTROENTEROLOGY OFFICE NOTE   EGYPT, MARCHIANO                      MRN:          [id number]  DATE:03/08/2006                            DOB:          1945/07/31    REASON FOR CONSULTATION:  Rectal bleeding.   Anthony Kim is a pleasant 65 year old white male referred through the  courtesy of Billie Bean for evaluation.  Over the last week he has noted  limited rectal bleeding, consisting of bright red blood coating his  stools.  He denies rectal or abdominal pain.  There is no history of  change in bowel habits.   PAST MEDICAL HISTORY:  Pertinent for hypertension and depression.  He is  status post appendectomy.   FAMILY HISTORY:  Pertinent for father with prostate cancer.   MEDICATIONS:  Benicar, Wellbutrin, Claritin, Pepcid, Vanquish, WelChol.   HE HAS NO ALLERGIES.   He does not smoke, he drinks rarely.  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   REVIEW OF SYSTEMS:  Positive for new anxiety and depression.   PHYSICAL EXAMINATION:  He is a healthy appearing male, pulse 72, blood  pressure 120/78, weight 226.  HEENT: EOMI. PERRLA. Sclerae are anicteric.  Conjunctivae are pink.  NECK:  Supple without thyromegaly, adenopathy or carotid bruits.  CHEST:  Clear to auscultation and percussion without adventitious  sounds.  CARDIAC:  Regular rhythm; normal S1 S2.  There are no murmurs, gallops  or rubs.  ABDOMEN:  Bowel sounds are normoactive.  Abdomen is soft, non-tender and  non-distended.  There are no abdominal masses, tenderness, splenic  enlargement or hepatomegaly.  EXTREMITIES:  Full range of motion.  No cyanosis, clubbing or edema.  RECTAL:  There is no stool in the rectal vault, he is Hemoccult  negative.   IMPRESSION:  Limited rectal bleeding - rule out hemorrhoids versus more  proximal bleeding sources including polyps, AVMs and neoplasm.   RECOMMENDATIONS:  A colonoscopy.     Barbette Hair.  Arlyce Dice, MD,FACG  Electronically Signed    RDK/MedQ  DD: 03/08/2006  DT: 03/08/2006  Job #: [id number]   cc:   Billie D. Bean, FNP

## 2010-06-04 NOTE — Letter (Signed)
March 08, 2006    Teola Bradley   RE:  PORFIRIO, BOLLIER  MRN:  [id number]  /  DOB:  December 06, 1945   Dear Mr. Bard:   It is my pleasure to have treated you recently as a new patient in my  office.  I appreciate your confidence and the opportunity to participate  in your care.   Since I do have a busy inpatient endoscopy schedule and office schedule,  my office hours vary weekly.  I am, however, available for emergency  calls every day through my office.  If I cannot promptly meet an urgent  office appointment, another one of our gastroenterologists will be able  to assist you.   My well-trained staff are prepared to help you at all times.  For  emergencies after office hours, a physician from our gastroenterology  section is always available through my 24-hour answering service.   While you are under my care, I encourage discussion of your questions  and concerns, and I will be happy to return your calls as soon as I am  available.   Once again, I welcome you as a new patient and I look forward to a happy  and healthy relationship.    Sincerely,      Barbette Hair. Arlyce Dice, MD,FACG  Electronically Signed   RDK/MedQ  DD: 03/08/2006  DT: 03/08/2006  Job #: [id number]

## 2010-06-04 NOTE — Letter (Signed)
March 08, 2006    Billie D. Jillyn Hidden, FNP  [address] [place], [place] [address]   RE:  JAHMIER, WILLADSEN  MRN:  [id number]  /  DOB:  [birth year]   Dear Dr. Jillyn Hidden:   Upon your kind referral, I had the pleasure of evaluating your patient  and I am pleased to offer my findings.  I saw Kiefer Opheim in the  office today.  Enclosed is a copy of my progress note that details my  findings and recommendations.   Thank you for the opportunity to participate in your patient's care.    Sincerely,      Barbette Hair. Arlyce Dice, MD,FACG  Electronically Signed    RDK/MedQ  DD: 03/08/2006  DT: 03/08/2006  Job #: [id number]

## 2010-06-16 ENCOUNTER — Encounter: Payer: Self-pay | Admitting: Cardiology

## 2010-06-16 ENCOUNTER — Encounter: Payer: Medicare Other | Admitting: Emergency Medicine

## 2010-06-16 ENCOUNTER — Ambulatory Visit (INDEPENDENT_AMBULATORY_CARE_PROVIDER_SITE_OTHER): Payer: Medicare Other | Admitting: Cardiology

## 2010-06-16 VITALS — BP 122/70 | HR 66 | Resp 14 | Ht 72.0 in | Wt 238.0 lb

## 2010-06-16 DIAGNOSIS — I10 Essential (primary) hypertension: Secondary | ICD-10-CM

## 2010-06-16 DIAGNOSIS — I82409 Acute embolism and thrombosis of unspecified deep veins of unspecified lower extremity: Secondary | ICD-10-CM

## 2010-06-16 NOTE — Progress Notes (Signed)
HPI Mr Lisle Skillman in today for followup of his history of deep vein thrombosis and hypertension.  He was recently admitted to the hospital at [facility] for a suspected blood clot. It turns out he has a torn meniscus in his right knee. He is being evaluated by orthopedic surgery. We will clear him if necessary.  He's had no problems with his anticoagulation. He remains on Coumadin. He's had no bleeding.  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 He's had no symptoms of angina or decompensated cardiac function.  His blood work is all by primary care.  No past medical history on file.  No past surgical history on file.  No family history on file.  History   Social History  . Marital Status: Married    Spouse Name: N/A    Number of Children: N/A  . Years of Education: N/A   Occupational History  . Not on file.   Social History Main Topics  . Smoking status: Never Smoker   . Smokeless tobacco: Not on file  . Alcohol Use: Not on file  . Drug Use: Not on file  . Sexually Active: Not on file   Other Topics Concern  . Not on file   Social History Narrative  . No narrative on file    No Known Allergies  . Current Outpatient Prescriptions  Medication Sig Dispense Refill  . acetaminophen (TYLENOL) 325 MG tablet every 6 (six) hours as needed.        . ALPRAZolam (XANAX) 0.5 MG tablet Take 1-2 by mouth daily       . atorvastatin (LIPITOR) 20 MG tablet Take 20 mg by mouth daily.        . fenofibrate (TRICOR) 145 MG tablet Take 145 mg by mouth daily.        . Multiple Vitamin (MULTIVITAMIN PO) Take 1 tablet by mouth daily.        . Omega-3 Fatty Acids (FISH OIL) 1000 MG CAPS Take 1 capsule by mouth daily.        Marland Kitchen omeprazole (PRILOSEC) 40 MG capsule Take 40 mg by mouth daily.        . Quercetin 50 MG TABS Take 1-2 tablets by mouth daily       . telmisartan (MICARDIS) 40 MG tablet Take 40 mg by mouth daily.        Marland Kitchen warfarin (COUMADIN) 5 MG tablet Take by mouth as directed.           ROS Negative other than HPI.   PE General Appearance: well developed, well nourished in no acute distress HEENT: symmetrical face, PERRLA, good dentition  Neck: no JVD, thyromegaly, or adenopathy, trachea midline Chest: symmetric without deformity Cardiac: PMI non-displaced, RRR, normal S1, S2, no gallop or murmur Lung: clear to ausculation and percussion Vascular: all pulses full without bruits  Abdominal: nondistended, nontender, good bowel sounds, no HSM, no bruits Extremities: no cyanosis, clubbing or edema, no sign of DVT, And there is that Skin: normal color, no rashes Neuro: alert and oriented x 3, non-focal Pysch: normal affect  Filed Vitals:   06/16/10 1137  BP: 122/70  Pulse: 66  Resp: 14  Height: 6' (1.829 m)  Weight: 238 lb (107.956 kg)    EKG  Labs and Studies Reviewed.   No results found for this basename: WBC, HGB, HCT, MCV, PLT      Chemistry      Component Value Date/Time   NA 143 05/21/2009 1117   K  5.0 05/21/2009 1117   CL 106 05/21/2009 1117   CO2 30 05/21/2009 1117   BUN 19 05/21/2009 1117   CREATININE 1.2 05/21/2009 1117      Component Value Date/Time   CALCIUM 9.1 05/21/2009 1117   ALKPHOS 63 10/26/2009 1019   AST 22 10/26/2009 1019   ALT 22 10/26/2009 1019   BILITOT 0.7 10/26/2009 1019       Lab Results  Component Value Date   CHOL 160 10/26/2009   CHOL 220* 08/21/2009   CHOL 223* 05/21/2009   Lab Results  Component Value Date   HDL 38.70* 10/26/2009   HDL 46.80 08/21/2009   HDL 47.60 05/21/2009   Lab Results  Component Value Date   LDLCALC 99 10/26/2009   LDLCALC 131* 10/28/2008   LDLCALC 118* 04/30/2008   Lab Results  Component Value Date   TRIG 112.0 10/26/2009   TRIG 180.0* 08/21/2009   TRIG 111.0 05/21/2009   Lab Results  Component Value Date   CHOLHDL 4 10/26/2009   CHOLHDL 5 08/21/2009   CHOLHDL 5 05/21/2009   No results found for this basename: HGBA1C   Lab Results  Component Value Date   ALT 22 10/26/2009   AST 22  10/26/2009   ALKPHOS 63 10/26/2009   BILITOT 0.7 10/26/2009   Lab Results  Component Value Date   TSH 1.15 04/30/2008    HEENT: symmetrical face, PERRLA, good dentition  Neck: no JVD, thyromegaly, or adenopathy, trachea midline Chest: symmetric without deformity Cardiac: PMI non-displaced, RRR, normal S1, S2, no gallop or murmur Lung: clear to ausculation and percussion Vascular: all pulses full without bruits  Abdominal: nondistended, nontender, good bowel sounds, no HSM, no bruits Extremities: no cyanosis, clubbing or edema, no sign of DVT,   Skin: normal color, no rashes Neuro: alert and oriented x 3, non-focal Pysch: normal affect

## 2010-06-16 NOTE — Patient Instructions (Signed)
Your physician recommends that you schedule a follow-up appointment in: 1 year with Dr. Wall  

## 2010-06-18 ENCOUNTER — Ambulatory Visit: Payer: Self-pay | Admitting: Internal Medicine

## 2010-06-23 ENCOUNTER — Ambulatory Visit (INDEPENDENT_AMBULATORY_CARE_PROVIDER_SITE_OTHER): Payer: Medicare Other | Admitting: Emergency Medicine

## 2010-06-23 DIAGNOSIS — I82409 Acute embolism and thrombosis of unspecified deep veins of unspecified lower extremity: Secondary | ICD-10-CM

## 2010-06-23 DIAGNOSIS — Z7901 Long term (current) use of anticoagulants: Secondary | ICD-10-CM

## 2010-07-19 ENCOUNTER — Telehealth: Payer: Self-pay | Admitting: Cardiology

## 2010-07-19 NOTE — Telephone Encounter (Signed)
Pt pharmacy calling wanting generic refill of Micardis 40 mg. Pt wanted something generic. Please ask DR to call in generic RX  [facility] [phone]

## 2010-07-20 ENCOUNTER — Telehealth: Payer: Self-pay | Admitting: *Deleted

## 2010-07-23 ENCOUNTER — Other Ambulatory Visit: Payer: Self-pay | Admitting: Cardiology

## 2010-07-23 MED ORDER — LOSARTAN POTASSIUM 50 MG PO TABS: 50.0000 mg | ORAL_TABLET | Freq: Every day | ORAL | Status: DC

## 2010-07-28 ENCOUNTER — Ambulatory Visit (INDEPENDENT_AMBULATORY_CARE_PROVIDER_SITE_OTHER): Payer: Medicare Other | Admitting: Emergency Medicine

## 2010-07-28 ENCOUNTER — Other Ambulatory Visit: Payer: Self-pay | Admitting: *Deleted

## 2010-07-28 ENCOUNTER — Telehealth: Payer: Self-pay | Admitting: *Deleted

## 2010-07-28 DIAGNOSIS — Z7901 Long term (current) use of anticoagulants: Secondary | ICD-10-CM

## 2010-07-28 DIAGNOSIS — I82409 Acute embolism and thrombosis of unspecified deep veins of unspecified lower extremity: Secondary | ICD-10-CM

## 2010-07-28 LAB — POCT INR: INR: 3.5

## 2010-07-28 MED ORDER — ATORVASTATIN CALCIUM 20 MG PO TABS: 20.0000 mg | ORAL_TABLET | Freq: Every day | ORAL | Status: DC

## 2010-07-28 MED ORDER — FENOFIBRATE 145 MG PO TABS: 145.0000 mg | ORAL_TABLET | Freq: Every day | ORAL | Status: DC

## 2010-07-28 MED ORDER — ALPRAZOLAM 0.5 MG PO TABS: 0.5000 mg | ORAL_TABLET | Freq: Three times a day (TID) | ORAL | Status: DC | PRN

## 2010-07-28 NOTE — Telephone Encounter (Signed)
Must have been entered wrong in our system. Ok to change to XR. Thanks.

## 2010-07-28 NOTE — Telephone Encounter (Signed)
Received a phone call from pharmacist stating that patient has been on extended release Alprazolam 0.5 and not immediate release.  Please advise.

## 2010-07-29 NOTE — Telephone Encounter (Signed)
Rx corrected in medication list, also called correction to pharmacy.

## 2010-08-25 ENCOUNTER — Ambulatory Visit (INDEPENDENT_AMBULATORY_CARE_PROVIDER_SITE_OTHER): Payer: Medicare Other | Admitting: Emergency Medicine

## 2010-08-25 DIAGNOSIS — Z7901 Long term (current) use of anticoagulants: Secondary | ICD-10-CM

## 2010-08-25 DIAGNOSIS — I82409 Acute embolism and thrombosis of unspecified deep veins of unspecified lower extremity: Secondary | ICD-10-CM

## 2010-08-25 LAB — POCT INR: INR: 3.1

## 2010-09-03 ENCOUNTER — Other Ambulatory Visit: Payer: Self-pay | Admitting: *Deleted

## 2010-09-03 MED ORDER — OMEPRAZOLE 40 MG PO CPDR: 40.0000 mg | DELAYED_RELEASE_CAPSULE | Freq: Every day | ORAL | Status: DC

## 2010-09-07 ENCOUNTER — Other Ambulatory Visit: Payer: Self-pay | Admitting: *Deleted

## 2010-09-07 MED ORDER — ATORVASTATIN CALCIUM 20 MG PO TABS: 20.0000 mg | ORAL_TABLET | Freq: Every day | ORAL | Status: DC

## 2010-09-07 MED ORDER — ALPRAZOLAM ER 0.5 MG PO TB24: 0.5000 mg | ORAL_TABLET | Freq: Three times a day (TID) | ORAL | Status: DC

## 2010-09-07 NOTE — Telephone Encounter (Signed)
Rx called to [facility].

## 2010-09-22 ENCOUNTER — Encounter: Payer: Medicare Other | Admitting: Emergency Medicine

## 2010-09-29 ENCOUNTER — Ambulatory Visit (INDEPENDENT_AMBULATORY_CARE_PROVIDER_SITE_OTHER): Payer: Medicare Other | Admitting: Emergency Medicine

## 2010-09-29 DIAGNOSIS — Z7901 Long term (current) use of anticoagulants: Secondary | ICD-10-CM

## 2010-09-29 DIAGNOSIS — I82409 Acute embolism and thrombosis of unspecified deep veins of unspecified lower extremity: Secondary | ICD-10-CM

## 2010-10-25 ENCOUNTER — Other Ambulatory Visit: Payer: Self-pay | Admitting: Pharmacist

## 2010-10-25 MED ORDER — WARFARIN SODIUM 5 MG PO TABS: ORAL_TABLET | ORAL | Status: DC

## 2010-10-27 ENCOUNTER — Encounter: Payer: Medicare Other | Admitting: Emergency Medicine

## 2010-10-28 LAB — CBC
MCHC: 34
MCV: 88.8
Platelets: 209
RBC: 4.72

## 2010-10-28 LAB — BASIC METABOLIC PANEL
BUN: 16
CO2: 30
Chloride: 102
Creatinine, Ser: 1.38

## 2010-10-28 LAB — DIFFERENTIAL
Basophils Relative: 1
Eosinophils Absolute: 0.1
Eosinophils Relative: 2
Monocytes Relative: 8
Neutrophils Relative %: 51

## 2010-10-28 LAB — PROTIME-INR: INR: 0.9

## 2010-11-03 ENCOUNTER — Ambulatory Visit (INDEPENDENT_AMBULATORY_CARE_PROVIDER_SITE_OTHER): Payer: Medicare Other | Admitting: Emergency Medicine

## 2010-11-03 DIAGNOSIS — Z7901 Long term (current) use of anticoagulants: Secondary | ICD-10-CM

## 2010-11-03 DIAGNOSIS — I82409 Acute embolism and thrombosis of unspecified deep veins of unspecified lower extremity: Secondary | ICD-10-CM

## 2010-11-24 ENCOUNTER — Other Ambulatory Visit: Payer: Self-pay | Admitting: *Deleted

## 2010-11-24 MED ORDER — FENOFIBRATE 145 MG PO TABS: 145.0000 mg | ORAL_TABLET | Freq: Every day | ORAL | Status: DC

## 2010-11-24 NOTE — Telephone Encounter (Signed)
Received fax from pharmacy requesting a nex Rx for Alprazolam XR 0.5mg .  Fax is in your IN box.

## 2010-11-25 MED ORDER — ALPRAZOLAM ER 0.5 MG PO TB24: 0.5000 mg | ORAL_TABLET | Freq: Three times a day (TID) | ORAL | Status: DC

## 2010-11-25 NOTE — Telephone Encounter (Signed)
Rx called to pharmacist at [facility].

## 2010-12-01 ENCOUNTER — Ambulatory Visit (INDEPENDENT_AMBULATORY_CARE_PROVIDER_SITE_OTHER): Payer: Medicare Other | Admitting: Emergency Medicine

## 2010-12-01 DIAGNOSIS — I82409 Acute embolism and thrombosis of unspecified deep veins of unspecified lower extremity: Secondary | ICD-10-CM

## 2010-12-01 DIAGNOSIS — Z7901 Long term (current) use of anticoagulants: Secondary | ICD-10-CM

## 2010-12-20 ENCOUNTER — Other Ambulatory Visit: Payer: Self-pay | Admitting: *Deleted

## 2010-12-20 MED ORDER — ATORVASTATIN CALCIUM 20 MG PO TABS: 20.0000 mg | ORAL_TABLET | Freq: Every day | ORAL | Status: DC

## 2011-01-19 ENCOUNTER — Other Ambulatory Visit: Payer: Self-pay | Admitting: *Deleted

## 2011-01-19 ENCOUNTER — Ambulatory Visit (INDEPENDENT_AMBULATORY_CARE_PROVIDER_SITE_OTHER): Payer: Medicare Other | Admitting: Emergency Medicine

## 2011-01-19 DIAGNOSIS — I82409 Acute embolism and thrombosis of unspecified deep veins of unspecified lower extremity: Secondary | ICD-10-CM | POA: Diagnosis not present

## 2011-01-19 DIAGNOSIS — Z7901 Long term (current) use of anticoagulants: Secondary | ICD-10-CM | POA: Diagnosis not present

## 2011-01-19 LAB — POCT INR: INR: 2

## 2011-01-19 MED ORDER — ALPRAZOLAM ER 0.5 MG PO TB24: 0.5000 mg | ORAL_TABLET | Freq: Three times a day (TID) | ORAL | Status: DC

## 2011-01-19 NOTE — Telephone Encounter (Signed)
Last refill 11/24/2010.

## 2011-01-19 NOTE — Telephone Encounter (Signed)
Rx called to [facility].

## 2011-01-31 ENCOUNTER — Telehealth: Payer: Self-pay | Admitting: Internal Medicine

## 2011-01-31 NOTE — Telephone Encounter (Signed)
[employer] medicare faxed and stated that his Xanax and stated this isn't covered under his plan,  They gave him a 31 day supply.  But to continue this med we have to request a formulary exception.  Would you like to change his medication or inquire about the exception.

## 2011-01-31 NOTE — Telephone Encounter (Signed)
Which benzo will they cover?

## 2011-02-03 NOTE — Telephone Encounter (Signed)
PA form in your IN box. 

## 2011-02-03 NOTE — Telephone Encounter (Signed)
Called [facility] and requested PA form for Alprazolam XR 0.5mg .

## 2011-02-04 ENCOUNTER — Encounter: Payer: Self-pay | Admitting: Gastroenterology

## 2011-02-04 ENCOUNTER — Encounter: Payer: Self-pay | Admitting: Family Medicine

## 2011-02-04 DIAGNOSIS — F419 Anxiety disorder, unspecified: Secondary | ICD-10-CM | POA: Insufficient documentation

## 2011-02-04 NOTE — Telephone Encounter (Signed)
PA form faxed to [facility] at [phone].

## 2011-02-04 NOTE — Telephone Encounter (Signed)
In my box

## 2011-02-07 ENCOUNTER — Telehealth: Payer: Self-pay | Admitting: *Deleted

## 2011-02-07 NOTE — Telephone Encounter (Signed)
Received Prior Authorization approval for Alprazolam ER 0.5mg  tablets.  Approved through 01/17/2012 under Medicare Part D benefit.

## 2011-03-02 ENCOUNTER — Encounter: Payer: Medicare Other | Admitting: Emergency Medicine

## 2011-03-09 ENCOUNTER — Ambulatory Visit (INDEPENDENT_AMBULATORY_CARE_PROVIDER_SITE_OTHER): Payer: Medicare Other | Admitting: *Deleted

## 2011-03-09 ENCOUNTER — Encounter: Payer: Medicare Other | Admitting: Emergency Medicine

## 2011-03-09 DIAGNOSIS — Z7901 Long term (current) use of anticoagulants: Secondary | ICD-10-CM

## 2011-03-09 DIAGNOSIS — I82409 Acute embolism and thrombosis of unspecified deep veins of unspecified lower extremity: Secondary | ICD-10-CM

## 2011-03-23 ENCOUNTER — Encounter: Payer: Self-pay | Admitting: Family Medicine

## 2011-03-23 ENCOUNTER — Ambulatory Visit (INDEPENDENT_AMBULATORY_CARE_PROVIDER_SITE_OTHER): Payer: Medicare Other | Admitting: Family Medicine

## 2011-03-23 VITALS — BP 130/90 | HR 68 | Temp 97.8°F | Ht 70.75 in | Wt 237.0 lb

## 2011-03-23 DIAGNOSIS — E7800 Pure hypercholesterolemia, unspecified: Secondary | ICD-10-CM | POA: Diagnosis not present

## 2011-03-23 DIAGNOSIS — Z23 Encounter for immunization: Secondary | ICD-10-CM | POA: Diagnosis not present

## 2011-03-23 DIAGNOSIS — I10 Essential (primary) hypertension: Secondary | ICD-10-CM

## 2011-03-23 DIAGNOSIS — N400 Enlarged prostate without lower urinary tract symptoms: Secondary | ICD-10-CM

## 2011-03-23 DIAGNOSIS — Z0000 Encounter for general adult medical examination without abnormal findings: Secondary | ICD-10-CM

## 2011-03-23 DIAGNOSIS — N139 Obstructive and reflux uropathy, unspecified: Secondary | ICD-10-CM | POA: Diagnosis not present

## 2011-03-23 LAB — CBC WITH DIFFERENTIAL/PLATELET
Eosinophils Absolute: 0.2 10*3/uL (ref 0.0–0.7)
HCT: 43.2 % (ref 39.0–52.0)
Lymphs Abs: 2.2 10*3/uL (ref 0.7–4.0)
MCHC: 33.8 g/dL (ref 30.0–36.0)
MCV: 91 fl (ref 78.0–100.0)
Monocytes Absolute: 0.4 10*3/uL (ref 0.1–1.0)
Neutrophils Relative %: 48.6 % (ref 43.0–77.0)
Platelets: 175 10*3/uL (ref 150.0–400.0)
RDW: 14 % (ref 11.5–14.6)

## 2011-03-23 LAB — BASIC METABOLIC PANEL
GFR: 64.24 mL/min (ref >60.00)
Glucose, Bld: 104 mg/dL — ABNORMAL HIGH (ref 70–99)
Potassium: 4.6 mEq/L (ref 3.5–5.1)
Sodium: 140 mEq/L (ref 135–145)

## 2011-03-23 LAB — LIPID PANEL
Cholesterol: 168 mg/dL (ref 0–200)
HDL: 46 mg/dL (ref >39.00)
Triglycerides: 92 mg/dL (ref 0.0–149.0)
VLDL: 18.4 mg/dL (ref 0.0–40.0)

## 2011-03-23 LAB — PSA: PSA: 0.68 ng/mL (ref 0.10–4.00)

## 2011-03-23 NOTE — Progress Notes (Signed)
Addended by: Eliezer Bottom on: 03/23/2011 09:55 AM   Modules accepted: Orders

## 2011-03-23 NOTE — Patient Instructions (Signed)
Good to see you. We will call you with your lab results.   

## 2011-03-23 NOTE — Progress Notes (Signed)
66 yo here for Welcome to Medicare.  I have personally reviewed the Medicare Annual Wellness questionnaire and have noted 1. The patient's medical and social history 2. Their use of alcohol, tobacco or illicit drugs 3. Their current medications and supplements 4. The patient's functional ability including ADL's, fall risks, home safety risks and hearing or visual             impairment. 5. Diet and physical activities 6. Evidence for depression or mood disorders  HTN- tolerating Cozaar 40 mg daily  Lab Results  Component Value Date   CREATININE 1.2 05/21/2009  No CP, SOB or LE edema.     HLD- taking Lipitor and Tricor. Lab Results  Component Value Date   CHOL 160 10/26/2009   HDL 38.70* 10/26/2009   LDLCALC 99 10/26/2009   LDLDIRECT 149.4 08/21/2009   TRIG 112.0 10/26/2009   CHOLHDL 4 10/26/2009     Anxiety/ depression- situational,lost both of his parents this year. Wife had an accident that required major surgery. Feels he is coping well with just prn xanax, off wellbutirn.  h/o recurrent DVT- lifelong coumadin.   Well man- Due for pneumovax. Has appt scheduled for follow up screening colonoscopy.  Discussed PSA screening- he does have some difficulty with weak stream, not progressive.   Patient Active Problem List  Diagnoses  . COLONIC POLYPS 2/08  . HYPERCHOLESTEROLEMIA  . ERECTILE DYSFUNCTION  . DEPRESSION  . HYPERTENSION  . DEEP VENOUS THROMBOPHLEBITIS, LEG, RIGHT  . URI  . RHINITIS  . Edema  . SKIN CANCER, HX OF  . ARRHYTHMIA, HX OF 6/98  . Deep vein thrombosis (DVT)  . Long term current use of anticoagulant  . Tick bite  . Right knee pain  . Anxiety  . Routine general medical examination at a health care facility   Past Medical History  Diagnosis Date  . Clotting disorder   . Hyperlipidemia    No past surgical history on file. History  Substance Use Topics  . Smoking status: Former Games developer  . Smokeless tobacco: Not on file  . Alcohol Use: No     Family History  Problem Relation Age of Onset  . Heart disease Mother   . Hypertension Mother   . Heart attack Father   . Hypertension Father    No Known Allergies Current Outpatient Prescriptions on File Prior to Visit  Medication Sig Dispense Refill  . acetaminophen (TYLENOL) 325 MG tablet every 6 (six) hours as needed.        . ALPRAZolam (XANAX XR) 0.5 MG 24 hr tablet Take 1 tablet (0.5 mg total) by mouth 3 (three) times daily.  90 tablet  0  . atorvastatin (LIPITOR) 20 MG tablet Take 1 tablet (20 mg total) by mouth daily.  30 tablet  12  . fenofibrate (TRICOR) 145 MG tablet Take 1 tablet (145 mg total) by mouth daily.  30 tablet  6  . losartan (COZAAR) 50 MG tablet Take 1 tablet (50 mg total) by mouth daily.  30 tablet  11  . Multiple Vitamin (MULTIVITAMIN PO) Take 1 tablet by mouth daily.        . Omega-3 Fatty Acids (FISH OIL) 1000 MG CAPS Take 1 capsule by mouth daily.        Marland Kitchen omeprazole (PRILOSEC) 40 MG capsule Take 1 capsule (40 mg total) by mouth daily.  30 capsule  11  . Quercetin 50 MG TABS Take 1-2 tablets by mouth daily       .  warfarin (COUMADIN) 5 MG tablet Take as directed by Anticoagulation clinic  45 tablet  3   The PMH, PSH, Social History, Family History, Medications, and allergies have been reviewed in [facility], and have been updated if relevant.  Review of Systems  See HPI  Patient reports no  vision/ hearing changes,anorexia, weight change, fever ,adenopathy, persistant / recurrent hoarseness, swallowing issues, chest pain, edema,persistant / recurrent cough, hemoptysis, dyspnea(rest, exertional, paroxysmal nocturnal), gastrointestinal  bleeding (melena, rectal bleeding), abdominal pain, excessive heart burn, GU symptoms(dysuria, hematuria, pyuria, voiding/incontinence  Issues) syncope, focal weakness, severe memory loss, concerning skin lesions, depression, anxiety, abnormal bruising/bleeding, major joint swelling.    Physical Exam  BP 130/90  Pulse 68   Temp(Src) 97.8 F (36.6 C) (Oral)  Ht 5' 10.75" (1.797 m)  Wt 237 lb (107.502 kg)  BMI 33.29 kg/m2  General:  overweght male in NAD Eyes:  PERRL Ears:  External ear exam shows no significant lesions or deformities.  Otoscopic examination reveals clear canals, tympanic membranes are intact bilaterally without bulging, retraction, inflammation or discharge. Hearing is grossly normal bilaterally. Nose:  External nasal examination shows no deformity or inflammation. Nasal mucosa are pink and moist without lesions or exudates. Mouth:  Oral mucosa and oropharynx without lesions or exudates.  Teeth in good repair. Neck:  no carotid bruit or thyromegaly no cervical or supraclavicular lymphadenopathy  Lungs:  Normal respiratory effort, chest expands symmetrically. Lungs are clear to auscultation, no crackles or wheezes. Heart:  Normal rate and regular rhythm. S1 and S2 normal without gallop, murmur, click, rub or other extra sounds. Abdomen:  Bowel sounds positive,abdomen soft and non-tender without masses, organomegaly or hernias noted. Pulses:  R and L posterior tibial pulses are full and equal bilaterally  Extremities:  no edema   Assessment and Plan: 1. Routine general medical examination at a health care facility   The patients weight, height, BMI and visual acuity have been recorded in the chart I have made referrals, counseling and provided education to the patient based review of the above and I have provided the pt with a written personalized care plan for preventive services.   Orders Placed This Encounter  Procedures  . PSA  . Basic Metabolic Panel  . Lipid Panel  . CBC with Differential     2. HYPERCHOLESTEROLEMIA  Lipid Panel  3. HYPERTENSION  Stable on current meds.

## 2011-03-29 ENCOUNTER — Encounter: Payer: Self-pay | Admitting: Gastroenterology

## 2011-03-29 ENCOUNTER — Telehealth: Payer: Self-pay | Admitting: *Deleted

## 2011-03-29 ENCOUNTER — Ambulatory Visit (INDEPENDENT_AMBULATORY_CARE_PROVIDER_SITE_OTHER): Payer: Medicare Other | Admitting: Gastroenterology

## 2011-03-29 VITALS — BP 122/78 | HR 67 | Ht 71.0 in | Wt 241.8 lb

## 2011-03-29 DIAGNOSIS — D689 Coagulation defect, unspecified: Secondary | ICD-10-CM

## 2011-03-29 DIAGNOSIS — D126 Benign neoplasm of colon, unspecified: Secondary | ICD-10-CM

## 2011-03-29 MED ORDER — PEG-KCL-NACL-NASULF-NA ASC-C 100 G PO SOLR: 1.0000 | Freq: Once | ORAL | Status: DC

## 2011-03-29 NOTE — Telephone Encounter (Signed)
03/29/2011    RE: Anthony Kim. DOB: 12/12/1945 MRN: [id number]   Dear Dr Daleen Squibb,    We have scheduled the above patient for an endoscopic procedure. Our records show that he is on anticoagulation therapy.   Please advise as to how long the patient may come off his therapy of Coumadin prior to the procedure, which is scheduled for 04/14/11.  Please fax back/ or route the completed form to Ronny Bacon at [phone].   Sincerely,  Vernia Buff

## 2011-03-29 NOTE — Telephone Encounter (Signed)
He can be off 3 days prior to procedure. Restart ASAP and recheck PT in 5 days.

## 2011-03-29 NOTE — Assessment & Plan Note (Addendum)
Patient will be scheduled for colonoscopy. I will check with his cardiologist, Dr. Daleen Squibb, to see whether we can hold Coumadin or whether he would require a bridge with Lovenox.

## 2011-03-29 NOTE — Patient Instructions (Signed)
You have been scheduled for a colonoscopy with propofol. Please follow written instructions given to you at your visit today.  Please pick up your prep kit at the pharmacy within the next 1-3 days. You will be contaced by our office prior to your procedure for directions on holding your Coumadin/Warfarin.  If you do not hear from our office 1 week prior to your scheduled procedure, please call [phone] to discuss. CC: Dr Daleen Squibb

## 2011-03-29 NOTE — Progress Notes (Signed)
History of Present Illness:  Anthony Kim is a 66 year old white male with history of colon polyps here for recall colonoscopy. Adenomatous polyps were removed in 2008. He has no GI complaints including change in bowel habits, abdominal pain or rectal bleeding. 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 He's had a DVT in the past.    Past Medical History  Diagnosis Date  . Clotting disorder   . Hyperlipidemia   . Colon polyps     2008 Tubular Adenoma  . Anxiety   . IBS (irritable bowel syndrome)    Past Surgical History  Procedure Date  . Appendectomy     1967  . Tonsillectomy and adenoidectomy    family history includes Heart attack in his father; Heart disease in his mother; Hypertension in his father and mother; Irritable bowel syndrome in his mother; and Prostate cancer in his father. Current Outpatient Prescriptions  Medication Sig Dispense Refill  . acetaminophen (TYLENOL) 325 MG tablet every 6 (six) hours as needed.        . ALPRAZolam (XANAX XR) 0.5 MG 24 hr tablet Take 1 tablet (0.5 mg total) by mouth 3 (three) times daily.  90 tablet  0  . atorvastatin (LIPITOR) 20 MG tablet Take 1 tablet (20 mg total) by mouth daily.  30 tablet  12  . fenofibrate (TRICOR) 145 MG tablet Take 1 tablet (145 mg total) by mouth daily.  30 tablet  6  . losartan (COZAAR) 50 MG tablet Take 1 tablet (50 mg total) by mouth daily.  30 tablet  11  . Multiple Vitamin (MULTIVITAMIN PO) Take 1 tablet by mouth daily.        . Omega-3 Fatty Acids (FISH OIL) 1000 MG CAPS Take 1 capsule by mouth daily.        Marland Kitchen omeprazole (PRILOSEC) 40 MG capsule Take 1 capsule (40 mg total) by mouth daily.  30 capsule  11  . Quercetin 50 MG TABS Take 1-2 tablets by mouth daily       . warfarin (COUMADIN) 5 MG tablet Take as directed by Anticoagulation clinic  45 tablet  3   Allergies as of 03/29/2011  . (No Known Allergies)    reports that he has quit smoking. His smoking use included  Cigarettes. He has never used smokeless tobacco. He reports that he does not drink alcohol or use illicit drugs.     Review of Systems: Pertinent positive and negative review of systems were noted in the above HPI section. All other review of systems were otherwise negative.  Vital signs were reviewed in today's medical record Physical Exam: General: Well developed , well nourished, no acute distress Head: Normocephalic and atraumatic Eyes:  sclerae anicteric, EOMI Ears: Normal auditory acuity Mouth: No deformity or lesions Neck: Supple, no masses or thyromegaly Lungs: Clear throughout to auscultation Heart: Regular rate and rhythm; no murmurs, rubs or bruits Abdomen: Soft, non tender and non distended. No masses, hepatosplenomegaly or hernias noted. Normal Bowel sounds Rectal:deferred Musculoskeletal: Symmetrical with no gross deformities  Skin: No lesions on visible extremities Pulses:  Normal pulses noted Extremities: No clubbing, cyanosis, edema or deformities noted Neurological: Alert oriented x 4, grossly nonfocal Cervical Nodes:  No significant cervical adenopathy Inguinal Nodes: No significant inguinal adenopathy Psychological:  Alert and cooperative. Normal mood and affect

## 2011-03-30 ENCOUNTER — Encounter: Payer: Self-pay | Admitting: Gastroenterology

## 2011-03-30 ENCOUNTER — Telehealth: Payer: Self-pay | Admitting: *Deleted

## 2011-03-30 NOTE — Telephone Encounter (Signed)
error 

## 2011-03-30 NOTE — Telephone Encounter (Signed)
ok 

## 2011-03-30 NOTE — Telephone Encounter (Signed)
Dr Arlyce Dice- Just an FYI.Marland KitchenMarland KitchenDr Daleen Squibb says it is okay for patient to hold coumadin only 3 days prior to test. He would also like him to have a PT/INR 5 days post d/c'ing coumadin.

## 2011-03-30 NOTE — Telephone Encounter (Signed)
Advised patient that Dr Daleen Squibb would like him to hold his coumadin only 3 days prior to his test, so he may hold coumadin beginning 04/11/11 until his procedure is completed on 04/14/11. I have also asked that he come for a PT/INR on 04/15/11 (5 days after stopping coumadin) as per Dr Vern Claude recommendations. Patient verbalizes understanding to hold coumadin on 04/11/11 and to come for labs on 04/15/11.

## 2011-04-06 DIAGNOSIS — N400 Enlarged prostate without lower urinary tract symptoms: Secondary | ICD-10-CM | POA: Diagnosis not present

## 2011-04-06 DIAGNOSIS — N529 Male erectile dysfunction, unspecified: Secondary | ICD-10-CM | POA: Diagnosis not present

## 2011-04-14 ENCOUNTER — Telehealth: Payer: Self-pay | Admitting: Radiology

## 2011-04-14 ENCOUNTER — Encounter: Payer: Self-pay | Admitting: Gastroenterology

## 2011-04-14 ENCOUNTER — Ambulatory Visit (AMBULATORY_SURGERY_CENTER): Payer: Medicare Other | Admitting: Gastroenterology

## 2011-04-14 ENCOUNTER — Telehealth: Payer: Self-pay | Admitting: *Deleted

## 2011-04-14 VITALS — BP 118/87 | HR 56 | Temp 97.0°F | Resp 17 | Ht 71.0 in | Wt 240.0 lb

## 2011-04-14 DIAGNOSIS — I82409 Acute embolism and thrombosis of unspecified deep veins of unspecified lower extremity: Secondary | ICD-10-CM | POA: Diagnosis not present

## 2011-04-14 DIAGNOSIS — Z1211 Encounter for screening for malignant neoplasm of colon: Secondary | ICD-10-CM | POA: Diagnosis not present

## 2011-04-14 DIAGNOSIS — Z860100 Personal history of colon polyps, unspecified: Secondary | ICD-10-CM

## 2011-04-14 DIAGNOSIS — I10 Essential (primary) hypertension: Secondary | ICD-10-CM | POA: Diagnosis not present

## 2011-04-14 DIAGNOSIS — D689 Coagulation defect, unspecified: Secondary | ICD-10-CM | POA: Diagnosis not present

## 2011-04-14 MED ORDER — SODIUM CHLORIDE 0.9 % IV SOLN: 500.0000 mL | INTRAVENOUS | Status: DC

## 2011-04-14 NOTE — Patient Instructions (Addendum)
Resume coumadin today    YOU HAD AN ENDOSCOPIC PROCEDURE TODAY AT THE [facility]: Refer to the procedure report that was given to you for any specific questions about what was found during the examination.  If the procedure report does not answer your questions, please call your gastroenterologist to clarify.  If you requested that your care partner not be given the details of your procedure findings, then the procedure report has been included in a sealed envelope for you to review at your convenience later.  YOU SHOULD EXPECT: Some feelings of bloating in the abdomen. Passage of more gas than usual.  Walking can help get rid of the air that was put into your GI tract during the procedure and reduce the bloating. If you had a lower endoscopy (such as a colonoscopy or flexible sigmoidoscopy) you may notice spotting of blood in your stool or on the toilet paper. If you underwent a bowel prep for your procedure, then you may not have a normal bowel movement for a few days.  DIET: Your first meal following the procedure should be a light meal and then it is ok to progress to your normal diet.  A half-sandwich or bowl of soup is an example of a good first meal.  Heavy or fried foods are harder to digest and may make you feel nauseous or bloated.  Likewise meals heavy in dairy and vegetables can cause extra gas to form and this can also increase the bloating.  Drink plenty of fluids but you should avoid alcoholic beverages for 24 hours.  ACTIVITY: Your care partner should take you home directly after the procedure.  You should plan to take it easy, moving slowly for the rest of the day.  You can resume normal activity the day after the procedure however you should NOT DRIVE or use heavy machinery for 24 hours (because of the sedation medicines used during the test).    SYMPTOMS TO REPORT IMMEDIATELY: A gastroenterologist can be reached at any hour.  During normal business hours, 8:30 AM to 5:00  PM Monday through Friday, call [phone].  After hours and on weekends, please call the GI answering service at [phone] who will take a message and have the physician on call contact you.   Following lower endoscopy (colonoscopy or flexible sigmoidoscopy):  Excessive amounts of blood in the stool  Significant tenderness or worsening of abdominal pains  Swelling of the abdomen that is new, acute  Fever of 100F or higher  Following upper endoscopy (EGD)  Vomiting of blood or coffee ground material  New chest pain or pain under the shoulder blades  Painful or persistently difficult swallowing  New shortness of breath  Fever of 100F or higher  Black, tarry-looking stools  FOLLOW UP: If any biopsies were taken you will be contacted by phone or by letter within the next 1-3 weeks.  Call your gastroenterologist if you have not heard about the biopsies in 3 weeks.  Our staff will call the home number listed on your records the next business day following your procedure to check on you and address any questions or concerns that you may have at that time regarding the information given to you following your procedure. This is a courtesy call and so if there is no answer at the home number and we have not heard from you through the emergency physician on call, we will assume that you have returned to your regular daily activities without incident.    SIGNATURES/CONFIDENTIALITY: You and/or your care partner have signed paperwork which will be entered into your electronic medical record.  These signatures attest to the fact that that the information above on your After Visit Summary has been reviewed and is understood.  Full responsibility of the confidentiality of this discharge information lies with you and/or your care-partner.  

## 2011-04-14 NOTE — Progress Notes (Signed)
He can resume his coumadin at previous dose and check INR on 4/3

## 2011-04-14 NOTE — Telephone Encounter (Signed)
Phoned Anthony Kim and informed him that Dr Arlyce Dice recommends he restart his coumadin at the previous dose and to have his INR checked on 4/3.  Anthony Kim verbalizes understanding.

## 2011-04-14 NOTE — Progress Notes (Signed)
PROPOFOL PER DS [facility] CRNA. SEE SCANNED INTRA PROCEDURE REPORT. EWM

## 2011-04-14 NOTE — Telephone Encounter (Signed)
Patient had a colonoscopy today, held Coumadin for 3 days in preparation. He was told to have his INR checked tomorrow all [facility] sites are closed, per Dr Dayton Martes this can wait until his normal visit Wednesday, April 3,2013. Patient instructed to start his regular dose today.

## 2011-04-14 NOTE — Progress Notes (Signed)
Patient did not experience any of the following events: a burn prior to discharge; a fall within the facility; wrong site/side/patient/procedure/implant event; or a hospital transfer or hospital admission upon discharge from the facility. (G8907) Patient did not have preoperative order for IV antibiotic SSI prophylaxis. (G8918)  

## 2011-04-14 NOTE — Op Note (Signed)
[facility] [address] [place], [place]  [address]  COLONOSCOPY PROCEDURE REPORT  PATIENT:  Anthony, Kim  MR#:  [id number] BIRTHDATE:  [birth year], 65 yrs. old  GENDER:  male ENDOSCOPIST:  Barbette Hair. Arlyce Dice, MD REF. BY:  Ruthe Mannan, M.D. PROCEDURE DATE:  04/14/2011 PROCEDURE:  Diagnostic Colonoscopy ASA CLASS:  Class II INDICATIONS:  Screening, history of pre-cancerous (adenomatous) colon polyps Index polypectomy 2008 MEDICATIONS:   MAC sedation, administered by CRNA propofol 200mg IV  DESCRIPTION OF PROCEDURE:   After the risks benefits and alternatives of the procedure were thoroughly explained, informed consent was obtained.  Digital rectal exam was performed and revealed no abnormalities.   The [id number] endoscope was introduced through the anus and advanced to the cecum, which was identified by both the appendix and ileocecal valve, without limitations.  The quality of the prep was good, using MoviPrep. The instrument was then slowly withdrawn as the colon was fully examined. <<PROCEDUREIMAGES>>  FINDINGS:  A normal appearing cecum, ileocecal valve, and appendiceal orifice were identified. The ascending, hepatic flexure, transverse, splenic flexure, descending, sigmoid colon, and rectum appeared unremarkable (see image1 and image2). Retroflexed views in the rectum revealed no abnormalities.    The time to cecum =  1) 1.75  minutes. The scope was then withdrawn in 1) 6.75  minutes from the cecum and the procedure completed. COMPLICATIONS:  None ENDOSCOPIC IMPRESSION: 1) Normal colon RECOMMENDATIONS: 1) Colonoscopy 5 years 2) resume coumdain today REPEAT EXAM:  In 5 year(s) for Colonoscopy.  ______________________________ Barbette Hair. Arlyce Dice, MD  CC:  n. eSIGNED:   Barbette Hair. Lyall Faciane at 04/14/2011 10:34 AM  Teola Bradley, [phone]

## 2011-04-14 NOTE — Telephone Encounter (Signed)
Patient called, and he is under the impression that he was to take his coumadin today, and then be checked for INR tomorrow at [facility].  Patient states that all labs are closed tomorrow, and he wants to know if he should just keep his INR appt. For 04/20/2011 in [place].   He also wants to know if he should take his coumadin for the next few days.

## 2011-04-14 NOTE — Progress Notes (Signed)
He can resume his previous dose of coumadin and check INR on 4/3.

## 2011-04-18 ENCOUNTER — Telehealth: Payer: Self-pay | Admitting: *Deleted

## 2011-04-18 NOTE — Telephone Encounter (Signed)
  Follow up Call-  Call back number 04/14/2011  Post procedure Call Back phone  # [phone]  Permission to leave phone message Yes     No answer at # given.

## 2011-04-20 ENCOUNTER — Ambulatory Visit: Payer: Medicare Other | Admitting: Cardiology

## 2011-04-27 ENCOUNTER — Ambulatory Visit (INDEPENDENT_AMBULATORY_CARE_PROVIDER_SITE_OTHER): Payer: Medicare Other

## 2011-04-27 DIAGNOSIS — Z7901 Long term (current) use of anticoagulants: Secondary | ICD-10-CM

## 2011-04-27 DIAGNOSIS — I82409 Acute embolism and thrombosis of unspecified deep veins of unspecified lower extremity: Secondary | ICD-10-CM

## 2011-04-27 LAB — POCT INR: INR: 1.8

## 2011-05-09 ENCOUNTER — Telehealth: Payer: Self-pay

## 2011-05-09 NOTE — Telephone Encounter (Signed)
05/07/11 pt cut arm on barb wire; no stiches needed; No redness,swelling or pus drainage and pt cleaning with peroxide and dressing with neosporin & telfa. Pt said barb wire was not rusty but barb wire comes in contact with a lot of animals. Pts last Td 09/16/08 but pt concerned since wire is in a lot of contact with animals should pt have another Td. Pt can be reached at [phone].

## 2011-05-09 NOTE — Telephone Encounter (Signed)
It shouldn't be necessary to receive another Td but it will not harm him to have another either. If he would prefer to have one, ok to administer.

## 2011-05-09 NOTE — Telephone Encounter (Signed)
Advised patient.  He says he will hold off on another Td at this time.

## 2011-05-12 ENCOUNTER — Other Ambulatory Visit: Payer: Self-pay | Admitting: *Deleted

## 2011-05-12 MED ORDER — ALPRAZOLAM ER 0.5 MG PO TB24: 0.5000 mg | ORAL_TABLET | Freq: Three times a day (TID) | ORAL | Status: DC

## 2011-05-12 NOTE — Telephone Encounter (Signed)
Faxed refill request from [facility], last filled on 01/19/11.

## 2011-05-12 NOTE — Telephone Encounter (Signed)
Medicine called to pharmacy. 

## 2011-05-13 ENCOUNTER — Other Ambulatory Visit: Payer: Self-pay | Admitting: *Deleted

## 2011-05-13 MED ORDER — WARFARIN SODIUM 5 MG PO TABS: ORAL_TABLET | ORAL | Status: DC

## 2011-05-25 ENCOUNTER — Ambulatory Visit (INDEPENDENT_AMBULATORY_CARE_PROVIDER_SITE_OTHER): Payer: Medicare Other

## 2011-05-25 DIAGNOSIS — Z7901 Long term (current) use of anticoagulants: Secondary | ICD-10-CM | POA: Diagnosis not present

## 2011-05-25 DIAGNOSIS — I82409 Acute embolism and thrombosis of unspecified deep veins of unspecified lower extremity: Secondary | ICD-10-CM | POA: Diagnosis not present

## 2011-05-25 LAB — POCT INR: INR: 3.3

## 2011-05-27 ENCOUNTER — Ambulatory Visit (INDEPENDENT_AMBULATORY_CARE_PROVIDER_SITE_OTHER): Payer: Medicare Other | Admitting: Cardiology

## 2011-05-27 ENCOUNTER — Encounter: Payer: Self-pay | Admitting: Cardiology

## 2011-05-27 VITALS — BP 128/86 | HR 60 | Ht 71.0 in | Wt 240.0 lb

## 2011-05-27 DIAGNOSIS — Z86718 Personal history of other venous thrombosis and embolism: Secondary | ICD-10-CM | POA: Diagnosis not present

## 2011-05-27 DIAGNOSIS — I10 Essential (primary) hypertension: Secondary | ICD-10-CM

## 2011-05-27 DIAGNOSIS — Z7901 Long term (current) use of anticoagulants: Secondary | ICD-10-CM

## 2011-05-27 DIAGNOSIS — E7800 Pure hypercholesterolemia, unspecified: Secondary | ICD-10-CM | POA: Diagnosis not present

## 2011-05-27 NOTE — Progress Notes (Signed)
HPI CG comes in the office today for management of his history of DVT and on chronic Coumadin. He also has hypertension which have managed to the years as well as hyperlipidemia. These are now being managed by his primary care. Recent results reviewed and  numbers look good.  He denies any increased swelling in his legs. He wears support stockings protecting the right leg. He remains very active without any symptoms of angina or ischemia. He is very compliant with his medications. He has not lost any weight.  Past Medical History  Diagnosis Date  . Clotting disorder   . Hyperlipidemia   . Colon polyps     2008 Tubular Adenoma  . Anxiety   . IBS (irritable bowel syndrome)     Current Outpatient Prescriptions  Medication Sig Dispense Refill  . acetaminophen (TYLENOL) 325 MG tablet every 6 (six) hours as needed.        . ALPRAZolam (XANAX XR) 0.5 MG 24 hr tablet Take 1 tablet (0.5 mg total) by mouth 3 (three) times daily.  90 tablet  0  . atorvastatin (LIPITOR) 20 MG tablet Take 1 tablet (20 mg total) by mouth daily.  30 tablet  12  . fenofibrate (TRICOR) 145 MG tablet Take 1 tablet (145 mg total) by mouth daily.  30 tablet  6  . losartan (COZAAR) 50 MG tablet Take 1 tablet (50 mg total) by mouth daily.  30 tablet  11  . Multiple Vitamin (MULTIVITAMIN PO) Take 1 tablet by mouth daily.        . Omega-3 Fatty Acids (FISH OIL) 1000 MG CAPS Take 1 capsule by mouth daily.        Marland Kitchen omeprazole (PRILOSEC) 40 MG capsule Take 1 capsule (40 mg total) by mouth daily.  30 capsule  11  . Quercetin 50 MG TABS Take 1-2 tablets by mouth daily       . warfarin (COUMADIN) 5 MG tablet Take as directed by Anticoagulation clinic  45 tablet  3    Allergies  Allergen Reactions  . Iodine     Family History  Problem Relation Age of Onset  . Heart disease Mother   . Hypertension Mother   . Heart attack Father   . Hypertension Father   . Prostate cancer Father   . Irritable bowel syndrome Mother      History   Social History  . Marital Status: Married    Spouse Name: N/A    Number of Children: 1  . Years of Education: N/A   Occupational History  . Sales    Social History Main Topics  . Smoking status: Former Smoker    Types: Cigarettes  . Smokeless tobacco: Never Used  . Alcohol Use: No  . Drug Use: No  . Sexually Active: Not on file   Other Topics Concern  . Not on file   Social History Narrative  . No narrative on file    ROS ALL NEGATIVE EXCEPT THOSE NOTED IN HPI  PE  General Appearance: well developed, well nourished in no acute distress, muscular, overweight HEENT: symmetrical face, PERRLA, good dentition  Neck: no JVD, thyromegaly, or adenopathy, trachea midline Chest: symmetric without deformity Cardiac: PMI non-displaced, RRR, normal S1, S2, no gallop or murmur Lung: clear to ausculation and percussion Vascular: all pulses full without bruits  Abdominal: nondistended, nontender, good bowel sounds, no HSM, no bruits Extremities: no cyanosis, clubbing, 1+ edema in the right lower extremity, no sign of DVT, no varicosities  Skin: normal color, no rashes Neuro: alert and oriented x 3, non-focal Pysch: normal affect  EKG Sinus bradycardia, otherwise normal EKG BMET    Component Value Date/Time   NA 140 03/23/2011 0944   K 4.6 03/23/2011 0944   CL 102 03/23/2011 0944   CO2 29 03/23/2011 0944   GLUCOSE 104* 03/23/2011 0944   BUN 20 03/23/2011 0944   CREATININE 1.2 03/23/2011 0944   CALCIUM 9.3 03/23/2011 0944   GFRNONAA 63.38 05/21/2009 1117   GFRAA 66 10/30/2007 0946    Lipid Panel     Component Value Date/Time   CHOL 168 03/23/2011 0944   TRIG 92.0 03/23/2011 0944   HDL 46.00 03/23/2011 0944   CHOLHDL 4 03/23/2011 0944   VLDL 18.4 03/23/2011 0944   LDLCALC 104* 03/23/2011 0944    CBC    Component Value Date/Time   WBC 5.5 03/23/2011 0944   RBC 4.75 03/23/2011 0944   HGB 14.6 03/23/2011 0944   HCT 43.2 03/23/2011 0944   PLT 175.0 03/23/2011 0944   MCV 91.0 03/23/2011  0944   MCHC 33.8 03/23/2011 0944   RDW 14.0 03/23/2011 0944   LYMPHSABS 2.2 03/23/2011 0944   MONOABS 0.4 03/23/2011 0944   EOSABS 0.2 03/23/2011 0944   BASOSABS 0.1 03/23/2011 0944

## 2011-05-27 NOTE — Patient Instructions (Signed)
Your physician recommends that you continue on your current medications as directed. Please refer to the Current Medication list given to you today.  Your physician wants you to follow-up in: 1 year. You will receive a reminder letter in the mail two months in advance. If you don't receive a letter, please call our office to schedule the follow-up appointment.  

## 2011-05-27 NOTE — Assessment & Plan Note (Signed)
He is a significant risk of having another clot. We'll continue Coumadin and support hose.

## 2011-06-21 NOTE — Progress Notes (Signed)
Addended by: Reine Just on: 06/21/2011 07:09 PM   Modules accepted: Orders

## 2011-06-22 ENCOUNTER — Ambulatory Visit (INDEPENDENT_AMBULATORY_CARE_PROVIDER_SITE_OTHER): Payer: Medicare Other

## 2011-06-22 DIAGNOSIS — I82409 Acute embolism and thrombosis of unspecified deep veins of unspecified lower extremity: Secondary | ICD-10-CM

## 2011-06-22 DIAGNOSIS — Z7901 Long term (current) use of anticoagulants: Secondary | ICD-10-CM | POA: Diagnosis not present

## 2011-06-22 LAB — POCT INR: INR: 2.6

## 2011-07-01 ENCOUNTER — Other Ambulatory Visit: Payer: Self-pay | Admitting: *Deleted

## 2011-07-01 MED ORDER — FENOFIBRATE 145 MG PO TABS: 145.0000 mg | ORAL_TABLET | Freq: Every day | ORAL | Status: DC

## 2011-07-20 ENCOUNTER — Ambulatory Visit (INDEPENDENT_AMBULATORY_CARE_PROVIDER_SITE_OTHER): Payer: Medicare Other | Admitting: *Deleted

## 2011-07-20 DIAGNOSIS — I82409 Acute embolism and thrombosis of unspecified deep veins of unspecified lower extremity: Secondary | ICD-10-CM

## 2011-07-20 DIAGNOSIS — Z7901 Long term (current) use of anticoagulants: Secondary | ICD-10-CM | POA: Diagnosis not present

## 2011-07-29 ENCOUNTER — Other Ambulatory Visit: Payer: Self-pay | Admitting: *Deleted

## 2011-07-29 MED ORDER — ALPRAZOLAM ER 0.5 MG PO TB24: 0.5000 mg | ORAL_TABLET | Freq: Three times a day (TID) | ORAL | Status: DC

## 2011-07-29 NOTE — Telephone Encounter (Signed)
[facility] 

## 2011-07-29 NOTE — Telephone Encounter (Signed)
Faxed refill request from [facility], last filled 05/12/11.

## 2011-08-04 ENCOUNTER — Other Ambulatory Visit: Payer: Self-pay | Admitting: *Deleted

## 2011-08-04 MED ORDER — LOSARTAN POTASSIUM 50 MG PO TABS: 50.0000 mg | ORAL_TABLET | Freq: Every day | ORAL | Status: DC

## 2011-08-08 DIAGNOSIS — L909 Atrophic disorder of skin, unspecified: Secondary | ICD-10-CM | POA: Diagnosis not present

## 2011-08-08 DIAGNOSIS — L821 Other seborrheic keratosis: Secondary | ICD-10-CM | POA: Diagnosis not present

## 2011-08-08 DIAGNOSIS — L819 Disorder of pigmentation, unspecified: Secondary | ICD-10-CM | POA: Diagnosis not present

## 2011-08-08 DIAGNOSIS — L919 Hypertrophic disorder of the skin, unspecified: Secondary | ICD-10-CM | POA: Diagnosis not present

## 2011-08-08 DIAGNOSIS — Z0189 Encounter for other specified special examinations: Secondary | ICD-10-CM | POA: Diagnosis not present

## 2011-08-17 ENCOUNTER — Ambulatory Visit (INDEPENDENT_AMBULATORY_CARE_PROVIDER_SITE_OTHER): Payer: Medicare Other

## 2011-08-17 DIAGNOSIS — Z7901 Long term (current) use of anticoagulants: Secondary | ICD-10-CM | POA: Diagnosis not present

## 2011-08-17 DIAGNOSIS — I82409 Acute embolism and thrombosis of unspecified deep veins of unspecified lower extremity: Secondary | ICD-10-CM | POA: Diagnosis not present

## 2011-08-23 ENCOUNTER — Telehealth: Payer: Self-pay

## 2011-08-23 NOTE — Telephone Encounter (Signed)
Pt charged for labs  03/23/11 visit; pt said Dr Dayton Martes said if pt having problem insurance should pay for test. Gave pt Annabelle in billings #.

## 2011-09-05 ENCOUNTER — Other Ambulatory Visit: Payer: Self-pay | Admitting: *Deleted

## 2011-09-05 MED ORDER — ALPRAZOLAM ER 0.5 MG PO TB24: 0.5000 mg | ORAL_TABLET | Freq: Three times a day (TID) | ORAL | Status: DC

## 2011-09-05 NOTE — Telephone Encounter (Signed)
Faxed refill request from [facility], last filled on 07/29/11.

## 2011-09-05 NOTE — Telephone Encounter (Signed)
[facility] 

## 2011-09-28 ENCOUNTER — Ambulatory Visit (INDEPENDENT_AMBULATORY_CARE_PROVIDER_SITE_OTHER): Payer: Medicare Other

## 2011-09-28 DIAGNOSIS — Z7901 Long term (current) use of anticoagulants: Secondary | ICD-10-CM

## 2011-09-28 DIAGNOSIS — I82409 Acute embolism and thrombosis of unspecified deep veins of unspecified lower extremity: Secondary | ICD-10-CM

## 2011-09-28 LAB — POCT INR: INR: 2.4

## 2011-10-04 ENCOUNTER — Other Ambulatory Visit: Payer: Self-pay | Admitting: *Deleted

## 2011-10-04 MED ORDER — OMEPRAZOLE 40 MG PO CPDR: 40.0000 mg | DELAYED_RELEASE_CAPSULE | Freq: Every day | ORAL | Status: DC

## 2011-10-31 ENCOUNTER — Ambulatory Visit (INDEPENDENT_AMBULATORY_CARE_PROVIDER_SITE_OTHER): Payer: Medicare Other

## 2011-10-31 DIAGNOSIS — Z23 Encounter for immunization: Secondary | ICD-10-CM

## 2011-11-01 ENCOUNTER — Other Ambulatory Visit: Payer: Self-pay | Admitting: *Deleted

## 2011-11-01 MED ORDER — WARFARIN SODIUM 5 MG PO TABS: ORAL_TABLET | ORAL | Status: DC

## 2011-11-02 ENCOUNTER — Other Ambulatory Visit: Payer: Self-pay | Admitting: *Deleted

## 2011-11-02 MED ORDER — ALPRAZOLAM ER 0.5 MG PO TB24: 0.5000 mg | ORAL_TABLET | Freq: Three times a day (TID) | ORAL | Status: DC

## 2011-11-02 MED ORDER — OMEPRAZOLE 40 MG PO CPDR: 40.0000 mg | DELAYED_RELEASE_CAPSULE | Freq: Every day | ORAL | Status: DC

## 2011-11-02 NOTE — Telephone Encounter (Signed)
Medicine called to pharmacy. 

## 2011-11-09 ENCOUNTER — Ambulatory Visit (INDEPENDENT_AMBULATORY_CARE_PROVIDER_SITE_OTHER): Payer: Medicare Other

## 2011-11-09 DIAGNOSIS — I82409 Acute embolism and thrombosis of unspecified deep veins of unspecified lower extremity: Secondary | ICD-10-CM | POA: Diagnosis not present

## 2011-11-09 DIAGNOSIS — Z7901 Long term (current) use of anticoagulants: Secondary | ICD-10-CM

## 2011-11-29 ENCOUNTER — Other Ambulatory Visit: Payer: Self-pay | Admitting: *Deleted

## 2011-11-29 MED ORDER — ALPRAZOLAM ER 0.5 MG PO TB24: 0.5000 mg | ORAL_TABLET | Freq: Three times a day (TID) | ORAL | Status: DC

## 2011-11-29 NOTE — Telephone Encounter (Signed)
rx called to pharmacy 

## 2011-12-21 ENCOUNTER — Ambulatory Visit (INDEPENDENT_AMBULATORY_CARE_PROVIDER_SITE_OTHER): Payer: Medicare Other

## 2011-12-21 DIAGNOSIS — I82409 Acute embolism and thrombosis of unspecified deep veins of unspecified lower extremity: Secondary | ICD-10-CM | POA: Diagnosis not present

## 2011-12-21 DIAGNOSIS — Z7901 Long term (current) use of anticoagulants: Secondary | ICD-10-CM

## 2011-12-21 LAB — POCT INR: INR: 2.6

## 2012-01-05 ENCOUNTER — Other Ambulatory Visit: Payer: Self-pay | Admitting: *Deleted

## 2012-01-05 MED ORDER — ALPRAZOLAM ER 0.5 MG PO TB24: 0.5000 mg | ORAL_TABLET | Freq: Three times a day (TID) | ORAL | Status: DC

## 2012-01-05 NOTE — Telephone Encounter (Signed)
Medicine called to [facility].

## 2012-01-12 ENCOUNTER — Ambulatory Visit (INDEPENDENT_AMBULATORY_CARE_PROVIDER_SITE_OTHER): Payer: Medicare Other | Admitting: Family Medicine

## 2012-01-12 ENCOUNTER — Encounter: Payer: Self-pay | Admitting: Family Medicine

## 2012-01-12 VITALS — BP 140/84 | HR 68 | Temp 97.9°F | Ht 71.0 in | Wt 252.5 lb

## 2012-01-12 DIAGNOSIS — Z7901 Long term (current) use of anticoagulants: Secondary | ICD-10-CM | POA: Diagnosis not present

## 2012-01-12 DIAGNOSIS — J209 Acute bronchitis, unspecified: Secondary | ICD-10-CM

## 2012-01-12 DIAGNOSIS — Z86718 Personal history of other venous thrombosis and embolism: Secondary | ICD-10-CM | POA: Diagnosis not present

## 2012-01-12 MED ORDER — DOXYCYCLINE HYCLATE 100 MG PO TABS: 100.0000 mg | ORAL_TABLET | Freq: Two times a day (BID) | ORAL | Status: DC

## 2012-01-12 NOTE — Progress Notes (Signed)
[facility] [address] [place] [address] Phone: [phone] Fax: [phone]  Date:  01/12/2012   Name:  Anthony Kim.   DOB:  March 13, 1945   MRN:  [id number] Gender: male Age: 66 y.o.  PCP:  Ruthe Mannan, MD  Evaluating MD: Hannah Beat, MD   Chief Complaint: Nasal Congestion   History of Present Illness:  Anthony Kim. is a 66 y.o. pleasant patient who presents with the following:  Acute Bronchitis: Patient presents for presents evaluation of chills, dyspnea, fever, myalgias, nasal congestion, productive cough with sputum described as yellow and sore throat. Symptoms began 3 days ago and are gradually worsening since that time.  Past history is significant for occasional episodes of bronchitis.    Patient Active Problem List  Diagnosis  . COLONIC POLYPS 2/08  . HYPERCHOLESTEROLEMIA  . ERECTILE DYSFUNCTION  . DEPRESSION  . HYPERTENSION  . History of DVT of lower extremity  . URI  . RHINITIS  . Edema  . SKIN CANCER, HX OF  . ARRHYTHMIA, HX OF 6/98  . Deep vein thrombosis (DVT)  . Long term current use of anticoagulant  . Tick bite  . Right knee pain  . Anxiety  . Routine general medical examination at a health care facility    Past Medical History  Diagnosis Date  . Clotting disorder   . Hyperlipidemia   . Colon polyps     2008 Tubular Adenoma  . Anxiety   . IBS (irritable bowel syndrome)     Past Surgical History  Procedure Date  . Appendectomy     1967  . Tonsillectomy and adenoidectomy   . Colonoscopy   . Polypectomy     History  Substance Use Topics  . Smoking status: Former Smoker    Types: Cigarettes  . Smokeless tobacco: Never Used  . Alcohol Use: No    Family History  Problem Relation Age of Onset  . Heart disease Mother   . Hypertension Mother   . Heart attack Father   . Hypertension Father   . Prostate cancer Father   . Irritable bowel syndrome Mother     Allergies  Allergen  Reactions  . Iodine     Medication list has been reviewed and updated.  Outpatient Prescriptions Prior to Visit  Medication Sig Dispense Refill  . acetaminophen (TYLENOL) 325 MG tablet every 6 (six) hours as needed.        . ALPRAZolam (XANAX XR) 0.5 MG 24 hr tablet Take 1 tablet (0.5 mg total) by mouth 3 (three) times daily.  90 tablet  0  . atorvastatin (LIPITOR) 20 MG tablet Take 1 tablet (20 mg total) by mouth daily.  30 tablet  12  . fenofibrate (TRICOR) 145 MG tablet Take 1 tablet (145 mg total) by mouth daily.  30 tablet  9  . losartan (COZAAR) 50 MG tablet Take 1 tablet (50 mg total) by mouth daily.  30 tablet  11  . Multiple Vitamin (MULTIVITAMIN PO) Take 1 tablet by mouth daily.        . Omega-3 Fatty Acids (FISH OIL) 1000 MG CAPS Take 1 capsule by mouth daily.        Marland Kitchen omeprazole (PRILOSEC) 40 MG capsule Take 1 capsule (40 mg total) by mouth daily.  30 capsule  3  . Quercetin 50 MG TABS Take 1-2 tablets by mouth daily       . warfarin (COUMADIN) 5 MG tablet Take  as directed by Anticoagulation clinic  45 tablet  3   Last reviewed on 01/12/2012 11:26 AM by Hannah Beat, MD  Review of Systems:  ROS: GEN: Acute illness details above GI: Tolerating PO intake GU: maintaining adequate hydration and urination Pulm: No SOB Interactive and getting along well at home.  Otherwise, ROS is as per the HPI.   Physical Examination: Filed Vitals:   01/12/12 1119  BP: 140/84  Pulse: 68  Temp: 97.9 F (36.6 C)  TempSrc: Oral  Height: 5\' 11"  (1.803 m)  Weight: 252 lb 8 oz (114.533 kg)  SpO2: 96%    Body mass index is 35.22 kg/(m^2). Ideal Body Weight: Weight in (lb) to have BMI = 25: 178.9    GEN: A and O x 3. WDWN. NAD.    ENT: Nose clear, ext NML.  No LAD.  No JVD.  TM's clear. Oropharynx clear.  PULM: Normal WOB, no distress. No crackles, wheezes, rhonchi. CV: RRR, no M/G/R, No rubs, No JVD.   EXT: warm and well-perfused, No c/c/e. PSYCH: Pleasant and  conversant.   Assessment and Plan:  1. Bronchitis, acute   2. Long term current use of anticoagulant   3. History of DVT of lower extremity    Feeling worse, prod cough, wife getting TKA tomorrow  Acute bronchitis: discussed plan of care. Given length of symptoms and overall history, will treat with ABX in this case. Continue with additional supportive care, cough medications, liquids, sleep, steam / vaporizer.  Recheck INR next week  Orders Today:  No orders of the defined types were placed in this encounter.    Updated Medication List: (Includes new medications, updates to list, dose adjustments) Meds ordered this encounter  Medications  . doxycycline (VIBRA-TABS) 100 MG tablet    Sig: Take 1 tablet (100 mg total) by mouth 2 (two) times daily.    Dispense:  20 tablet    Refill:  0    Medications Discontinued: There are no discontinued medications.   Hannah Beat, MD

## 2012-01-12 NOTE — Patient Instructions (Signed)
Recheck INR next wed at [facility] in [place]

## 2012-02-01 ENCOUNTER — Ambulatory Visit (INDEPENDENT_AMBULATORY_CARE_PROVIDER_SITE_OTHER): Payer: Medicare Other

## 2012-02-01 DIAGNOSIS — I82409 Acute embolism and thrombosis of unspecified deep veins of unspecified lower extremity: Secondary | ICD-10-CM | POA: Diagnosis not present

## 2012-02-01 DIAGNOSIS — Z7901 Long term (current) use of anticoagulants: Secondary | ICD-10-CM | POA: Diagnosis not present

## 2012-02-01 LAB — POCT INR: INR: 2.2

## 2012-02-21 ENCOUNTER — Other Ambulatory Visit: Payer: Self-pay | Admitting: *Deleted

## 2012-02-21 MED ORDER — ALPRAZOLAM ER 0.5 MG PO TB24: 0.5000 mg | ORAL_TABLET | Freq: Three times a day (TID) | ORAL | Status: DC

## 2012-02-21 NOTE — Telephone Encounter (Signed)
Faxed refill request from [facility], last filled 01/05/12.  Pt has appt for physical 03/22/12.

## 2012-02-21 NOTE — Telephone Encounter (Signed)
Alprazolam called to [facility].

## 2012-03-14 ENCOUNTER — Ambulatory Visit (INDEPENDENT_AMBULATORY_CARE_PROVIDER_SITE_OTHER): Payer: Medicare Other

## 2012-03-14 DIAGNOSIS — I82409 Acute embolism and thrombosis of unspecified deep veins of unspecified lower extremity: Secondary | ICD-10-CM

## 2012-03-14 DIAGNOSIS — Z7901 Long term (current) use of anticoagulants: Secondary | ICD-10-CM

## 2012-03-14 LAB — POCT INR: INR: 2.4

## 2012-03-22 ENCOUNTER — Encounter: Payer: Self-pay | Admitting: Family Medicine

## 2012-03-22 ENCOUNTER — Ambulatory Visit (INDEPENDENT_AMBULATORY_CARE_PROVIDER_SITE_OTHER): Payer: Medicare Other | Admitting: Family Medicine

## 2012-03-22 ENCOUNTER — Other Ambulatory Visit: Payer: Self-pay | Admitting: *Deleted

## 2012-03-22 VITALS — BP 120/82 | HR 64 | Temp 97.8°F | Ht 70.5 in | Wt 245.0 lb

## 2012-03-22 DIAGNOSIS — Z125 Encounter for screening for malignant neoplasm of prostate: Secondary | ICD-10-CM

## 2012-03-22 DIAGNOSIS — J309 Allergic rhinitis, unspecified: Secondary | ICD-10-CM | POA: Diagnosis not present

## 2012-03-22 DIAGNOSIS — Z1331 Encounter for screening for depression: Secondary | ICD-10-CM | POA: Diagnosis not present

## 2012-03-22 DIAGNOSIS — Z86718 Personal history of other venous thrombosis and embolism: Secondary | ICD-10-CM

## 2012-03-22 DIAGNOSIS — F329 Major depressive disorder, single episode, unspecified: Secondary | ICD-10-CM

## 2012-03-22 DIAGNOSIS — I10 Essential (primary) hypertension: Secondary | ICD-10-CM

## 2012-03-22 DIAGNOSIS — Z0000 Encounter for general adult medical examination without abnormal findings: Secondary | ICD-10-CM

## 2012-03-22 DIAGNOSIS — E7800 Pure hypercholesterolemia, unspecified: Secondary | ICD-10-CM | POA: Diagnosis not present

## 2012-03-22 LAB — LIPID PANEL
LDL Cholesterol: 114 mg/dL — ABNORMAL HIGH (ref 0–99)
Total CHOL/HDL Ratio: 4
Triglycerides: 100 mg/dL (ref 0.0–149.0)

## 2012-03-22 LAB — COMPREHENSIVE METABOLIC PANEL
AST: 32 U/L (ref 0–37)
Albumin: 3.8 g/dL (ref 3.5–5.2)
Alkaline Phosphatase: 60 U/L (ref 39–117)
BUN: 14 mg/dL (ref 6–23)
Potassium: 4.7 mEq/L (ref 3.5–5.1)
Sodium: 140 mEq/L (ref 135–145)
Total Protein: 6.6 g/dL (ref 6.0–8.3)

## 2012-03-22 MED ORDER — ATORVASTATIN CALCIUM 20 MG PO TABS: 20.0000 mg | ORAL_TABLET | Freq: Every day | ORAL | Status: DC

## 2012-03-22 MED ORDER — FLUTICASONE PROPIONATE 50 MCG/ACT NA SUSP: 2.0000 | Freq: Every day | NASAL | Status: DC

## 2012-03-22 NOTE — Patient Instructions (Addendum)
Good to see you. Let's start Flonase and Allegra (over the counter) to see if this helps with symptoms.  We will call you with your lab work in the next day or two.

## 2012-03-22 NOTE — Progress Notes (Signed)
67 yo here for annual medicare wellness visit.  I have personally reviewed the Medicare Annual Wellness questionnaire and have noted 1. The patient's medical and social history 2. Their use of alcohol, tobacco or illicit drugs 3. Their current medications and supplements 4. The patient's functional ability including ADL's, fall risks, home safety risks and hearing or visual             impairment. 5. Diet and physical activities 6. Evidence for depression or mood disorders  He has been helping to care for his wife who had TKR at end of December.  He has a meniscal tear himself, so not easy for him to lift and bend.  She is getting better and regaining her strength.  Allergic rhinitis- deteriorated.  Symptoms worse in morning for past several months, "nose feels stopped up."  No fevers or chills.  Taking Claritin daily and using Netty pot.  HTN- tolerating Cozaar 40 mg daily  Lab Results  Component Value Date   CREATININE 1.2 03/23/2011  No CP, SOB or LE edema.     HLD- taking Lipitor and Tricor. Lab Results  Component Value Date   CHOL 168 03/23/2011   HDL 46.00 03/23/2011   LDLCALC 104* 03/23/2011   LDLDIRECT 149.4 08/21/2009   TRIG 92.0 03/23/2011   CHOLHDL 4 03/23/2011     Anxiety/ depression- .Feels he is coping well with just prn xanax, off wellbutirn.  h/o recurrent DVT- lifelong coumadin.   Well man-UTD on prevention, colonoscopy last year.  Discussed PSA screening- he does have some difficulty with weak stream, not progressive.  End of life wishes discussed and updated in Social History.    Patient Active Problem List  Diagnosis  . COLONIC POLYPS 2/08  . HYPERCHOLESTEROLEMIA  . ERECTILE DYSFUNCTION  . DEPRESSION  . HYPERTENSION  . History of DVT of lower extremity  . Edema  . SKIN CANCER, HX OF  . ARRHYTHMIA, HX OF 6/98  . Deep vein thrombosis (DVT)  . Long term current use of anticoagulant  . Anxiety  . Routine general medical examination at a health care  facility   Past Medical History  Diagnosis Date  . Clotting disorder   . Hyperlipidemia   . Colon polyps     2008 Tubular Adenoma  . Anxiety   . IBS (irritable bowel syndrome)    Past Surgical History  Procedure Laterality Date  . Appendectomy      1967  . Tonsillectomy and adenoidectomy    . Colonoscopy    . Polypectomy     History  Substance Use Topics  . Smoking status: Former Smoker    Types: Cigarettes  . Smokeless tobacco: Never Used  . Alcohol Use: No   Family History  Problem Relation Age of Onset  . Heart disease Mother   . Hypertension Mother   . Heart attack Father   . Hypertension Father   . Prostate cancer Father   . Irritable bowel syndrome Mother    Allergies  Allergen Reactions  . Iodine    Current Outpatient Prescriptions on File Prior to Visit  Medication Sig Dispense Refill  . acetaminophen (TYLENOL) 325 MG tablet every 6 (six) hours as needed.        . ALPRAZolam (XANAX XR) 0.5 MG 24 hr tablet Take 1 tablet (0.5 mg total) by mouth 3 (three) times daily.  90 tablet  0  . atorvastatin (LIPITOR) 20 MG tablet Take 1 tablet (20 mg total) by mouth daily.  30  tablet  12  . doxycycline (VIBRA-TABS) 100 MG tablet Take 1 tablet (100 mg total) by mouth 2 (two) times daily.  20 tablet  0  . fenofibrate (TRICOR) 145 MG tablet Take 1 tablet (145 mg total) by mouth daily.  30 tablet  9  . losartan (COZAAR) 50 MG tablet Take 1 tablet (50 mg total) by mouth daily.  30 tablet  11  . Multiple Vitamin (MULTIVITAMIN PO) Take 1 tablet by mouth daily.        . Omega-3 Fatty Acids (FISH OIL) 1000 MG CAPS Take 1 capsule by mouth daily.        Marland Kitchen omeprazole (PRILOSEC) 40 MG capsule Take 1 capsule (40 mg total) by mouth daily.  30 capsule  3  . Quercetin 50 MG TABS Take 1-2 tablets by mouth daily       . warfarin (COUMADIN) 5 MG tablet Take as directed by Anticoagulation clinic  45 tablet  3   No current facility-administered medications on file prior to visit.   The  PMH, PSH, Social History, Family History, Medications, and allergies have been reviewed in [facility], and have been updated if relevant.  Review of Systems  See HPI  + voiding issues- hesitancy, difficulty getting good stream Patient reports no  vision/ hearing changes,anorexia, weight change, fever ,adenopathy, persistant / recurrent hoarseness, swallowing issues, chest pain, edema,persistant / recurrent cough, hemoptysis, dyspnea(rest, exertional, paroxysmal nocturnal), gastrointestinal  bleeding (melena, rectal bleeding), abdominal pain, excessive heart burn,  syncope, focal weakness, severe memory loss, concerning skin lesions, depression, anxiety, abnormal bruising/bleeding, major joint swelling.    Physical Exam  BP 120/82  Pulse 64  Temp(Src) 97.8 F (36.6 C)  Ht 5' 10.5" (1.791 m)  Wt 245 lb (111.131 kg)  BMI 34.65 kg/m2  General:  overweght male in NAD Eyes:  PERRL Ears:  External ear exam shows no significant lesions or deformities.  Otoscopic examination reveals clear canals, tympanic membranes are intact bilaterally without bulging, retraction, inflammation or discharge. Hearing is grossly normal bilaterally. Nose:  External nasal examination shows no deformity or inflammation. Nasal mucosa are pink and moist without lesions or exudates. Mouth:  Oral mucosa and oropharynx without lesions or exudates.  Teeth in good repair. Neck:  no carotid bruit or thyromegaly no cervical or supraclavicular lymphadenopathy  Lungs:  Normal respiratory effort, chest expands symmetrically. Lungs are clear to auscultation, no crackles or wheezes. Heart:  Normal rate and regular rhythm. S1 and S2 normal without gallop, murmur, click, rub or other extra sounds. Abdomen:  Bowel sounds positive,abdomen soft and non-tender without masses, organomegaly or hernias noted. Pulses:  R and L posterior tibial pulses are full and equal bilaterally  Extremities:  no edema   Assessment and Plan:  1.  DEPRESSION Stable within meds.  Uses occasional prn xanax.   2. History of DVT of lower extremity On life long coumadin.  3. HYPERCHOLESTEROLEMIA On lipitor and fish oil.  Recheck labs today. - Comprehensive metabolic panel - Lipid Panel  4. HYPERTENSION Controlled on current meds. - Comprehensive metabolic panel  5. Routine general medical examination at a health care facility The patients weight, height, BMI and visual acuity have been recorded in the chart I have made referrals, counseling and provided education to the patient based review of the above and I have provided the pt with a written personalized care plan for preventive services.   6. Screening for prostate cancer Prostate cancer screening and PSA options (with potential risks and benefits of  testing vs not testing) were discussed along with recent recs/guidelines.  He does desire PSA at this point.  He is also followed by urology.  - PSA, Medicare  7. Allergic rhinitis Deteriorated.  Rx sent for flonase.  Advised trying Allegra instead of claritin. The patient indicates understanding of these issues and agrees with the plan.

## 2012-03-26 ENCOUNTER — Encounter: Payer: Self-pay | Admitting: *Deleted

## 2012-03-28 DIAGNOSIS — N400 Enlarged prostate without lower urinary tract symptoms: Secondary | ICD-10-CM | POA: Diagnosis not present

## 2012-03-28 DIAGNOSIS — N529 Male erectile dysfunction, unspecified: Secondary | ICD-10-CM | POA: Diagnosis not present

## 2012-04-25 ENCOUNTER — Other Ambulatory Visit: Payer: Self-pay | Admitting: *Deleted

## 2012-04-25 ENCOUNTER — Ambulatory Visit (INDEPENDENT_AMBULATORY_CARE_PROVIDER_SITE_OTHER): Payer: Medicare Other

## 2012-04-25 DIAGNOSIS — Z7901 Long term (current) use of anticoagulants: Secondary | ICD-10-CM

## 2012-04-25 DIAGNOSIS — I82409 Acute embolism and thrombosis of unspecified deep veins of unspecified lower extremity: Secondary | ICD-10-CM | POA: Diagnosis not present

## 2012-04-25 NOTE — Telephone Encounter (Signed)
Faxed refill request from [facility], last filled 03/21/12.

## 2012-04-26 MED ORDER — ALPRAZOLAM ER 0.5 MG PO TB24: 0.5000 mg | ORAL_TABLET | Freq: Three times a day (TID) | ORAL | Status: DC

## 2012-04-26 NOTE — Telephone Encounter (Signed)
[facility] 

## 2012-05-24 ENCOUNTER — Other Ambulatory Visit: Payer: Self-pay | Admitting: *Deleted

## 2012-05-24 MED ORDER — ALPRAZOLAM ER 0.5 MG PO TB24: 0.5000 mg | ORAL_TABLET | Freq: Three times a day (TID) | ORAL | Status: DC

## 2012-05-24 NOTE — Telephone Encounter (Signed)
Medicine called [facility].

## 2012-05-24 NOTE — Telephone Encounter (Signed)
Last filled 04/26/12

## 2012-06-06 ENCOUNTER — Ambulatory Visit (INDEPENDENT_AMBULATORY_CARE_PROVIDER_SITE_OTHER): Payer: Medicare Other

## 2012-06-06 DIAGNOSIS — Z7901 Long term (current) use of anticoagulants: Secondary | ICD-10-CM

## 2012-06-06 DIAGNOSIS — I82409 Acute embolism and thrombosis of unspecified deep veins of unspecified lower extremity: Secondary | ICD-10-CM | POA: Diagnosis not present

## 2012-06-06 LAB — POCT INR: INR: 2.3

## 2012-06-26 ENCOUNTER — Other Ambulatory Visit: Payer: Self-pay | Admitting: *Deleted

## 2012-06-26 MED ORDER — FENOFIBRATE 145 MG PO TABS: 145.0000 mg | ORAL_TABLET | Freq: Every day | ORAL | Status: DC

## 2012-06-26 NOTE — Telephone Encounter (Signed)
Received faxed refill request from pharmacy Refill sent electronically to pharmacy. 

## 2012-06-27 ENCOUNTER — Ambulatory Visit: Payer: Medicare Other | Admitting: Cardiology

## 2012-06-28 ENCOUNTER — Other Ambulatory Visit: Payer: Self-pay | Admitting: *Deleted

## 2012-06-29 MED ORDER — WARFARIN SODIUM 5 MG PO TABS: ORAL_TABLET | ORAL | Status: DC

## 2012-07-18 ENCOUNTER — Ambulatory Visit (INDEPENDENT_AMBULATORY_CARE_PROVIDER_SITE_OTHER): Payer: Medicare Other | Admitting: *Deleted

## 2012-07-18 DIAGNOSIS — Z7901 Long term (current) use of anticoagulants: Secondary | ICD-10-CM

## 2012-07-18 DIAGNOSIS — I82409 Acute embolism and thrombosis of unspecified deep veins of unspecified lower extremity: Secondary | ICD-10-CM

## 2012-08-15 ENCOUNTER — Emergency Department (HOSPITAL_COMMUNITY)
Admission: EM | Admit: 2012-08-15 | Discharge: 2012-08-15 | Disposition: A | Discharge status: Home / Self Care | Payer: Medicare Other | Attending: Emergency Medicine | Admitting: Emergency Medicine

## 2012-08-15 ENCOUNTER — Emergency Department (HOSPITAL_COMMUNITY): Payer: Medicare Other

## 2012-08-15 ENCOUNTER — Encounter (HOSPITAL_COMMUNITY): Payer: Self-pay | Admitting: Emergency Medicine

## 2012-08-15 ENCOUNTER — Telehealth: Payer: Self-pay | Admitting: Family Medicine

## 2012-08-15 DIAGNOSIS — Z888 Allergy status to other drugs, medicaments and biological substances status: Secondary | ICD-10-CM | POA: Insufficient documentation

## 2012-08-15 DIAGNOSIS — Z79899 Other long term (current) drug therapy: Secondary | ICD-10-CM | POA: Diagnosis not present

## 2012-08-15 DIAGNOSIS — Z8719 Personal history of other diseases of the digestive system: Secondary | ICD-10-CM | POA: Insufficient documentation

## 2012-08-15 DIAGNOSIS — F411 Generalized anxiety disorder: Secondary | ICD-10-CM | POA: Diagnosis not present

## 2012-08-15 DIAGNOSIS — R062 Wheezing: Secondary | ICD-10-CM | POA: Insufficient documentation

## 2012-08-15 DIAGNOSIS — K8020 Calculus of gallbladder without cholecystitis without obstruction: Secondary | ICD-10-CM | POA: Diagnosis not present

## 2012-08-15 DIAGNOSIS — R0602 Shortness of breath: Secondary | ICD-10-CM | POA: Diagnosis not present

## 2012-08-15 DIAGNOSIS — Z86718 Personal history of other venous thrombosis and embolism: Secondary | ICD-10-CM | POA: Insufficient documentation

## 2012-08-15 DIAGNOSIS — D689 Coagulation defect, unspecified: Secondary | ICD-10-CM | POA: Insufficient documentation

## 2012-08-15 DIAGNOSIS — K589 Irritable bowel syndrome without diarrhea: Secondary | ICD-10-CM | POA: Diagnosis not present

## 2012-08-15 DIAGNOSIS — Z87891 Personal history of nicotine dependence: Secondary | ICD-10-CM | POA: Diagnosis not present

## 2012-08-15 DIAGNOSIS — R05 Cough: Secondary | ICD-10-CM | POA: Diagnosis not present

## 2012-08-15 DIAGNOSIS — R0609 Other forms of dyspnea: Secondary | ICD-10-CM | POA: Diagnosis not present

## 2012-08-15 DIAGNOSIS — Z7901 Long term (current) use of anticoagulants: Secondary | ICD-10-CM | POA: Insufficient documentation

## 2012-08-15 DIAGNOSIS — I499 Cardiac arrhythmia, unspecified: Secondary | ICD-10-CM | POA: Insufficient documentation

## 2012-08-15 DIAGNOSIS — I10 Essential (primary) hypertension: Secondary | ICD-10-CM | POA: Diagnosis not present

## 2012-08-15 DIAGNOSIS — E785 Hyperlipidemia, unspecified: Secondary | ICD-10-CM | POA: Insufficient documentation

## 2012-08-15 DIAGNOSIS — R0989 Other specified symptoms and signs involving the circulatory and respiratory systems: Secondary | ICD-10-CM | POA: Insufficient documentation

## 2012-08-15 DIAGNOSIS — R0600 Dyspnea, unspecified: Secondary | ICD-10-CM

## 2012-08-15 HISTORY — DX: Essential (primary) hypertension: I10

## 2012-08-15 LAB — CBC WITH DIFFERENTIAL/PLATELET
Basophils Absolute: 0 10*3/uL (ref 0.0–0.1)
Basophils Relative: 1 % (ref 0–1)
HCT: 42.4 % (ref 39.0–52.0)
MCHC: 35.4 g/dL (ref 30.0–36.0)
Monocytes Absolute: 0.5 10*3/uL (ref 0.1–1.0)
Neutro Abs: 3 10*3/uL (ref 1.7–7.7)
Neutrophils Relative %: 53 % (ref 43–77)
Platelets: 183 10*3/uL (ref 150–400)
RDW: 13.6 % (ref 11.5–15.5)

## 2012-08-15 LAB — TROPONIN I
Troponin I: <0.3 ng/mL (ref <0.30)
Troponin I: <0.3 ng/mL (ref <0.30)

## 2012-08-15 LAB — BASIC METABOLIC PANEL
Chloride: 104 mEq/L (ref 96–112)
Creatinine, Ser: 1.21 mg/dL (ref 0.50–1.35)
GFR calc Af Amer: 69 mL/min — ABNORMAL LOW (ref >90)

## 2012-08-15 MED ORDER — ASPIRIN 81 MG PO CHEW
324.0000 mg | CHEWABLE_TABLET | Freq: Once | ORAL | Status: AC
  Administered 2012-08-15: 324 mg via ORAL
  Filled 2012-08-15: qty 4

## 2012-08-15 MED ORDER — IOHEXOL 350 MG/ML SOLN
100.0000 mL | Freq: Once | INTRAVENOUS | Status: AC | PRN
  Administered 2012-08-15: 100 mL via INTRAVENOUS

## 2012-08-15 MED ORDER — METHYLPREDNISOLONE SODIUM SUCC 125 MG IJ SOLR
125.0000 mg | Freq: Once | INTRAMUSCULAR | Status: AC
  Administered 2012-08-15: 125 mg via INTRAVENOUS
  Filled 2012-08-15: qty 2

## 2012-08-15 MED ORDER — DIPHENHYDRAMINE HCL 50 MG/ML IJ SOLN
50.0000 mg | Freq: Once | INTRAMUSCULAR | Status: AC
  Administered 2012-08-15: 50 mg via INTRAVENOUS
  Filled 2012-08-15: qty 1

## 2012-08-15 NOTE — ED Notes (Signed)
Patient states that he has been short of breath x 1 month.   Patient denies chest pain, but claims that his heart rate has been running 100-105.   Patient states that his blood pressure has been a little elevated.   Patient states that today his blood pressure has been SBP 150s.   Patient states he does have a history of HTN, but is controlled with medications.  Patient claims that he is on coumadin and has had a DVT in his legs about 3 years ago.

## 2012-08-15 NOTE — ED Provider Notes (Signed)
CSN: [id number]     Arrival date & time 08/15/12  1010 History     First MD Initiated Contact with Patient 08/15/12 1011     Chief Complaint  Patient presents with  . Shortness of Breath   (Consider location/radiation/quality/duration/timing/severity/associated sxs/prior Treatment) HPI Comments: 67 yo male with dvt hx, long term coumadin, hypercholesterol, no mi/ or known cad, past smoker presents with intermittent dyspnea for one month.  Pt has had 4-5 episodes of dyspnea that come on randomly, not exertional.  No hx of similar.  No lung dz or PE hx.  No recent surgery or travel.  Pt feels well otherwise.  No cp.  Improves with time, lasts maybe an hour.  Mild allergy to contrast, hives.  Patient is a 67 y.o. male presenting with shortness of breath. The history is provided by the patient.  Shortness of Breath Severity:  Mild Associated symptoms: no abdominal pain, 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, no fever, no headaches, no neck pain, no rash and no vomiting     Past Medical History  Diagnosis Date  . Clotting disorder   . Hyperlipidemia   . Colon polyps     2008 Tubular Adenoma  . Anxiety   . IBS (irritable bowel syndrome)   . Hypertension   . Irregular heart beat    Past Surgical History  Procedure Laterality Date  . Appendectomy      1967  . Tonsillectomy and adenoidectomy    . Colonoscopy    . Polypectomy     Family History  Problem Relation Age of Onset  . Heart disease Mother   . Hypertension Mother   . Heart attack Father   . Hypertension Father   . Prostate cancer Father   . Irritable bowel syndrome Mother    History  Substance Use Topics  . Smoking status: Former Smoker    Types: Cigarettes  . Smokeless tobacco: Never Used  . Alcohol Use: Yes    Review of Systems  Constitutional: Negative for fever and chills.  HENT: Negative for neck pain and neck stiffness.   Eyes: Negative for visual disturbance.  Respiratory: Positive for shortness of breath.   Cardiovascular:  Negative for chest pain and leg swelling.  Gastrointestinal: Negative for vomiting and abdominal pain.  Genitourinary: Negative for dysuria and flank pain.  Musculoskeletal: Negative for back pain.  Skin: Negative for rash.  Neurological: Negative for light-headedness and headaches.    Allergies  Iodine  Home Medications   Current Outpatient Rx  Name  Route  Sig  Dispense  Refill  . acetaminophen (TYLENOL) 325 MG tablet   Oral   Take 325 mg by mouth every 6 (six) hours as needed for pain.          Marland Kitchen ALPRAZolam (XANAX XR) 0.5 MG 24 hr tablet   Oral   Take 0.5 mg by mouth every morning.         Marland Kitchen atorvastatin (LIPITOR) 20 MG tablet   Oral   Take 1 tablet (20 mg total) by mouth daily.   30 tablet   6   . buPROPion (WELLBUTRIN XL) 150 MG 24 hr tablet   Oral   Take 150 mg by mouth daily.         . fenofibrate (TRICOR) 145 MG tablet   Oral   Take 1 tablet (145 mg total) by mouth daily.   30 tablet   5   . fexofenadine (ALLEGRA) 180 MG tablet   Oral   Take 180  mg by mouth daily.         Marland Kitchen losartan (COZAAR) 50 MG tablet   Oral   Take 50 mg by mouth daily.         . Multiple Vitamin (MULTIVITAMIN PO)   Oral   Take 1 tablet by mouth daily.           . Omega-3 Fatty Acids (FISH OIL) 1000 MG CAPS   Oral   Take 1 capsule by mouth daily.           Marland Kitchen omeprazole (PRILOSEC) 40 MG capsule   Oral   Take 1 capsule (40 mg total) by mouth daily.   30 capsule   3   . warfarin (COUMADIN) 5 MG tablet   Oral   Take 5-7.5 mg by mouth See admin instructions. Take one and a half tab (7.5mg ) on Tuesdays and Saturdays. Take one tab (5 mg) all other days.         . fluticasone (FLONASE) 50 MCG/ACT nasal spray   Nasal   Place 2 sprays into the nose daily.   16 g   6    BP 123/92  Pulse 70  Temp(Src) 98.1 F (36.7 C) (Oral)  Resp 27  Ht 5\' 10"  (1.778 m)  Wt 240 lb (108.863 kg)  BMI 34.44 kg/m2  SpO2 97% Physical Exam  Nursing note and vitals  reviewed. Constitutional: He is oriented to person, place, and time. He appears well-developed and well-nourished.  HENT:  Head: Normocephalic and atraumatic.  Eyes: Conjunctivae are normal. Right eye exhibits no discharge. Left eye exhibits no discharge.  Neck: Normal range of motion. Neck supple. No tracheal deviation present.  Cardiovascular: Normal rate, regular rhythm and intact distal pulses.   No murmur heard. Pulmonary/Chest: Effort normal. He has wheezes (ferw bilateral exp).  Abdominal: Soft. He exhibits no distension. There is no tenderness. There is no guarding.  Musculoskeletal: He exhibits no edema and no tenderness.  Neurological: He is alert and oriented to person, place, and time.  Skin: Skin is warm. No rash noted.  Psychiatric: He has a normal mood and affect.    ED Course   Procedures (including critical care time)  Labs Reviewed  PROTIME-INR - Abnormal; Notable for the following:    Prothrombin Time 24.6 (*)    INR 2.31 (*)    All other components within normal limits  CBC WITH DIFFERENTIAL  TROPONIN I  BASIC METABOLIC PANEL  TROPONIN I   Dg Chest 2 View  08/15/2012   *RADIOLOGY REPORT*  Clinical Data: Shortness of breath, cough.  CHEST - 2 VIEW  Comparison: Chest CT 10/17/2006  Findings: Heart and mediastinal contours are within normal limits. No focal opacities or effusions.  No acute bony abnormality. Degenerative spurring in the thoracic spine.  IMPRESSION: No active cardiopulmonary disease.   Original Report Authenticated By: Charlett Nose, M.D.   No diagnosis found.  MDM  Concern for Parox a fib vs Lung dz vs early CHF vs CAD vs PE with DVT hx.  INR is not subtherapeutic thus decreasing change of PE however knowing he is failing on coumadin and cause of episodes would be clinically useful. Steroids and benadryl prior to study. No cp in ed.   Date: 08/15/2012  Rate: 75   Rhythm: normal sinus rhythm  QRS Axis: normal  Intervals: normal  ST/T Wave  abnormalities: normal  Conduction Disutrbances:none  Narrative Interpretation:   Old EKG Reviewed: unchanged  Dg Chest 2 View  08/15/2012   *  RADIOLOGY REPORT*  Clinical Data: Shortness of breath, cough.  CHEST - 2 VIEW  Comparison: Chest CT 10/17/2006  Findings: Heart and mediastinal contours are within normal limits. No focal opacities or effusions.  No acute bony abnormality. Degenerative spurring in the thoracic spine.  IMPRESSION: No active cardiopulmonary disease.   Original Report Authenticated By: Charlett Nose, M.D.   Ct Angio Chest Pe W/cm &/or Wo Cm  08/15/2012   *RADIOLOGY REPORT*  Clinical Data: Shortness of breath.  History of DVT.  CT ANGIOGRAPHY CHEST  Technique:  Multidetector CT imaging of the chest using the standard protocol during bolus administration of intravenous contrast. Multiplanar reconstructed images including MIPs were obtained and reviewed to evaluate the vascular anatomy.  Contrast: OMNIPAQUE IOHEXOL 350 MG/ML SOLN the patient was premedicated due to contrast allergy  Comparison: 10/17/2006  Findings: There is good contrast opacification of the pulmonary artery branches.  No discrete filling defect to suggest acute PE.Adequate contrast opacification of the thoracic aorta with no evidence of dissection, aneurysm, or stenosis. There is classic 3- vessel brachiocephalic arch anatomy.  Patchy coronary and aortic calcifications.  No hilar or mediastinal adenopathy.  No pleural or pericardial effusion.  At least one large gallstone noted in the nondistended gallbladder, incompletely visualized.  Mild patchy ground-glass opacities posteriorly in both lower lobes, in a perihilar distribution, and to a mild degree posteriorly in both upper lobes, similar to that seen on previous exam.  No confluent airspace consolidation. Prominent anterior endplate osteophytes at multiple contiguous levels in the mid and lower thoracic spine.  IMPRESSION: 1.  Negative for acute PE or thoracic  aortic dissection. 2. Atherosclerosis, including . coronary artery disease. Please note that although the presence of coronary artery calcium documents the presence of coronary artery disease, the severity of this disease and any potential stenosis cannot be assessed on this non-gated CT examination.  Assessment for potential risk factor modification, dietary therapy or pharmacologic therapy may be warranted, if clinically indicated. 3.  Cholelithiasis   Original Report Authenticated By: D. Andria Rhein, MD   Rechecked patient twice and had thorough discussion regarding findings and possible causes. Patient very well appearing, no cp, sob or palpitations in ED.  Pt feels great, he is okay with close fup outpt versus observation in hospital as his sxs are intermittent for a month, not worsening and thorough work up in ED negative. Discussed non specific calcium finding on CT and outpt stress with pcp.  DC    Enid Skeens, MD 08/15/12 2256

## 2012-08-15 NOTE — Telephone Encounter (Signed)
Patient Information:  Caller Name: Tadarius  Phone: [phone]  Patient: Anthony Kim, Anthony Kim  Gender: Male  DOB: 10/19/1945  Age: 67 Years  PCP: Ruthe Mannan [facility])  Office Follow Up:  Does the office need to follow up with this patient?: No  Instructions For The Office: N/A  RN Note:  Staates suddenly had shortness of breath x 3 episodes during the day.  Was working outdoors at the time.  Took his blood pressure 1800 08/14/12 138/84 with pulse 101.  Rechecked 1930 08/14/12 and BP was 132/74 pulse 104.  On arising AM 08/15/12, states at 0900 08/15/12 blood pressure 158/92 pulse 79.  States slept well, and has no concerns or SOB today.  Denies occurrence of chest pain.  States has mild cough which started a month ago.  States he felt anxious during the episodes, but after taking a xanax before bed he felt better and slept well. Patient is on coumadin for past leg blood clot.  Per difficulty breathing protocol, advised ED due to prior history of embolism.  Patient agrees to go to [facility] ED.  krs/can  Symptoms  Reason For Call & Symptoms: SOB 08/14/12  Reviewed Health History In EMR: Yes  Reviewed Medications In EMR: Yes  Reviewed Allergies In EMR: Yes  Reviewed Surgeries / Procedures: Yes  Date of Onset of Symptoms: 08/14/2012  Guideline(s) Used:  Breathing Difficulty  Disposition Per Guideline:   Go to ED Now  Reason For Disposition Reached:   Any history of prior "blood clot" in leg or lungs  Advice Given:  N/A  Patient Will Follow Care Advice:  YES

## 2012-08-16 ENCOUNTER — Encounter: Payer: Self-pay | Admitting: Family Medicine

## 2012-08-16 ENCOUNTER — Ambulatory Visit (INDEPENDENT_AMBULATORY_CARE_PROVIDER_SITE_OTHER): Payer: Medicare Other | Admitting: Family Medicine

## 2012-08-16 VITALS — BP 140/80 | HR 76 | Temp 97.9°F | Wt 245.0 lb

## 2012-08-16 DIAGNOSIS — F411 Generalized anxiety disorder: Secondary | ICD-10-CM | POA: Diagnosis not present

## 2012-08-16 DIAGNOSIS — R0989 Other specified symptoms and signs involving the circulatory and respiratory systems: Secondary | ICD-10-CM

## 2012-08-16 DIAGNOSIS — F419 Anxiety disorder, unspecified: Secondary | ICD-10-CM

## 2012-08-16 DIAGNOSIS — R0609 Other forms of dyspnea: Secondary | ICD-10-CM | POA: Diagnosis not present

## 2012-08-16 MED ORDER — BUPROPION HCL ER (XL) 150 MG PO TB24: 150.0000 mg | ORAL_TABLET | Freq: Every day | ORAL | Status: DC

## 2012-08-16 NOTE — Assessment & Plan Note (Signed)
Moderate control.. Could be contributing to dyspnea. Agree with restarting wellbutrin XL in addition to xaxax he is already taking.  Rx sent in.  Follow up on mood with PCP at next OV.

## 2012-08-16 NOTE — Patient Instructions (Addendum)
Keep appt with Dr. Daleen Squibb as scheduled.  Call Dr. Daleen Squibb sooner if recurrence of shortness of breath with exertion or any chest pain.  Restart wellbutrin XL

## 2012-08-16 NOTE — Assessment & Plan Note (Signed)
Initial work up for cardiac source negative, but given risk factors... I agree with recommendations of cardiac stress test. He already has an appt with his cardiologist on 8/120 for annual visit.  Otherwise no clear pulmonary cause, no PE, no anemai... Can consider evaluation of TSH if not improving.  Possible contribution from recent increase in stress.. See anxiety notes.

## 2012-08-16 NOTE — Progress Notes (Signed)
Subjective:    Patient ID: Anthony Kim., male    DOB: 1945-03-31, 67 y.o.   MRN: [id number]  HPI   67 year old male pt of Dr. Elmer Sow with history of HTN, DVT on coumadin, high cholesterol presents following ER visit on 7/30 for intermittant dyspnea x 1 month.  Pt has had 4-5 episodes of dyspnea that come on randomly, mostly  exertional. No hx of similar. No lung dz or PE hx. No recent surgery or travel. Pt feels well otherwise. No cp. Improves with time, lasts maybe an hour. Mild allergy to contrast, hives.  BP 138/90  HR 104  In ER:  EKG: unchanged from previous  CXR: no CV disease  Chest CT: IMPRESSION: 1. Negative for acute PE or thoracic aortic dissection. 2. Atherosclerosis, including . coronary artery disease. Please note that although the presence of coronary artery calcium documents the presence of coronary artery disease, the severity of this disease and any potential stenosis cannot be assessed on this non-gated CT examination. Assessment for potential risk factor modification, dietary therapy or pharmacologic therapy may be warranted, if clinically indicated. 3. Cholelithiasis  CE x 1 and CMET, cbc: nml  He states today he feels back to normal today.  No DOE.  No cough, no fever.   Cardiac risk: CAD father age 65, uncles x 6 with CAD, previous smoker, high chol, HTN No DM, PVD, no CVA.  He has seen Dr. Daleen Squibb  In past. Will see Dr. Mariah Milling in past. He has annual  appt already scheduled on 8/20.   He has been under a lot of stress lately.. In past was on wellbutrin for depression and anxiety. He started old wellbutrin but it is from 2011.. Likely ineffective. Uses xanax  one daily.     Review of Systems  Constitutional: Negative for fever, fatigue and unexpected weight change.  HENT: Negative for ear pain, congestion, sore throat, rhinorrhea, sneezing, trouble swallowing and postnasal drip.   Eyes: Negative for pain and itching.  Respiratory: Positive for shortness of  breath. Negative for cough and wheezing.   Cardiovascular: Negative for chest pain, palpitations and leg swelling.  Gastrointestinal: Negative for nausea, abdominal pain, diarrhea, constipation and blood in stool.  Genitourinary: Negative for dysuria, urgency, hematuria, discharge, penile swelling, scrotal swelling, difficulty urinating, penile pain and testicular pain.  Skin: Negative for rash.  Neurological: Negative for syncope, weakness, light-headedness, numbness and headaches.  Psychiatric/Behavioral: Negative for behavioral problems and dysphoric mood. The patient is not nervous/anxious.        Objective:   Physical Exam  Constitutional: He is oriented to person, place, and time. Vital signs are normal. He appears well-developed and well-nourished.  Overweight appearing male in NAD  HENT:  Head: Normocephalic.  Right Ear: Hearing normal.  Left Ear: Hearing normal.  Nose: Nose normal.  Mouth/Throat: Oropharynx is clear and moist and mucous membranes are normal.  Neck: Trachea normal. Carotid bruit is not present. No mass and no thyromegaly present.  Cardiovascular: Normal rate, regular rhythm and normal pulses.  Exam reveals no gallop, no distant heart sounds and no friction rub.   No murmur heard. No peripheral edema  Pulmonary/Chest: Effort normal and breath sounds normal. No respiratory distress.  Neurological: He is alert and oriented to person, place, and time.  Skin: Skin is warm, dry and intact. No rash noted.  Psychiatric: He has a normal mood and affect. His speech is normal and behavior is normal. Thought content normal.  Assessment & Plan:

## 2012-08-21 ENCOUNTER — Other Ambulatory Visit: Payer: Self-pay | Admitting: *Deleted

## 2012-08-21 MED ORDER — ALPRAZOLAM ER 0.5 MG PO TB24: 0.5000 mg | ORAL_TABLET | ORAL | Status: DC

## 2012-08-21 MED ORDER — LOSARTAN POTASSIUM 50 MG PO TABS: 50.0000 mg | ORAL_TABLET | Freq: Every day | ORAL | Status: DC

## 2012-08-21 NOTE — Telephone Encounter (Signed)
Directions changed to one a day and I called in quantity of 30, instead of 90, for a one month supply.

## 2012-08-21 NOTE — Telephone Encounter (Signed)
Faxed refill request from [facility] for alprazolam .5 mg's, # 90.  Request is for one three times a day, but pt reported at his last visit that he only takes one a day.  Should I change directions?  Please advise.

## 2012-08-21 NOTE — Addendum Note (Signed)
Addended by: Eliezer Bottom on: 08/21/2012 03:34 PM   Modules accepted: Orders

## 2012-08-21 NOTE — Telephone Encounter (Signed)
Yes please change directions.

## 2012-08-22 ENCOUNTER — Ambulatory Visit (INDEPENDENT_AMBULATORY_CARE_PROVIDER_SITE_OTHER): Payer: Medicare Other

## 2012-08-22 ENCOUNTER — Telehealth: Payer: Self-pay | Admitting: *Deleted

## 2012-08-22 DIAGNOSIS — I82409 Acute embolism and thrombosis of unspecified deep veins of unspecified lower extremity: Secondary | ICD-10-CM | POA: Diagnosis not present

## 2012-08-22 DIAGNOSIS — Z7901 Long term (current) use of anticoagulants: Secondary | ICD-10-CM | POA: Diagnosis not present

## 2012-08-22 NOTE — Telephone Encounter (Signed)
Cardiologist calling asking for order for stress test mentioned in OV note with Dr. Ermalene Searing and in ED note. Pt has appt on 09/05/12 with Dr. Daleen Squibb and would like him to have the test done on 09/03/12. Please advise

## 2012-08-22 NOTE — Telephone Encounter (Signed)
Called Cardiology to set up Stress Test and the test has already been scheduled by Dr Daleen Squibb. So will ask Dr Tacy Learn to cancel her order.

## 2012-08-24 NOTE — Telephone Encounter (Signed)
See previous phone note. We scheduled test in anticipation of order from Dr. Tacy Learn b/c the pt would be out of town and only had a 2 day window of opportunity to have the test done prior to his appointment with Dr. Daleen Squibb Those dates the pt had available were 8/18 & 8/19  Have called Bonita Quin at [facility] & they will reenter the order. Mylo Red RN

## 2012-08-31 ENCOUNTER — Encounter: Payer: Self-pay | Admitting: Cardiology

## 2012-09-03 ENCOUNTER — Ambulatory Visit (HOSPITAL_COMMUNITY): Payer: Medicare Other | Attending: Cardiology | Admitting: Radiology

## 2012-09-03 VITALS — BP 127/87 | Ht 70.0 in | Wt 247.0 lb

## 2012-09-03 DIAGNOSIS — R0602 Shortness of breath: Secondary | ICD-10-CM | POA: Diagnosis not present

## 2012-09-03 DIAGNOSIS — Z8249 Family history of ischemic heart disease and other diseases of the circulatory system: Secondary | ICD-10-CM | POA: Insufficient documentation

## 2012-09-03 DIAGNOSIS — R42 Dizziness and giddiness: Secondary | ICD-10-CM | POA: Diagnosis not present

## 2012-09-03 DIAGNOSIS — I4949 Other premature depolarization: Secondary | ICD-10-CM | POA: Diagnosis not present

## 2012-09-03 DIAGNOSIS — R002 Palpitations: Secondary | ICD-10-CM | POA: Insufficient documentation

## 2012-09-03 DIAGNOSIS — Z87891 Personal history of nicotine dependence: Secondary | ICD-10-CM | POA: Diagnosis not present

## 2012-09-03 DIAGNOSIS — R0609 Other forms of dyspnea: Secondary | ICD-10-CM | POA: Diagnosis not present

## 2012-09-03 DIAGNOSIS — I498 Other specified cardiac arrhythmias: Secondary | ICD-10-CM | POA: Insufficient documentation

## 2012-09-03 DIAGNOSIS — R0989 Other specified symptoms and signs involving the circulatory and respiratory systems: Secondary | ICD-10-CM | POA: Insufficient documentation

## 2012-09-03 DIAGNOSIS — R5381 Other malaise: Secondary | ICD-10-CM | POA: Diagnosis not present

## 2012-09-03 DIAGNOSIS — I10 Essential (primary) hypertension: Secondary | ICD-10-CM | POA: Insufficient documentation

## 2012-09-03 MED ORDER — TECHNETIUM TC 99M SESTAMIBI GENERIC - CARDIOLITE
33.0000 | Freq: Once | INTRAVENOUS | Status: AC | PRN
  Administered 2012-09-03: 33 via INTRAVENOUS

## 2012-09-03 MED ORDER — TECHNETIUM TC 99M SESTAMIBI GENERIC - CARDIOLITE
11.0000 | Freq: Once | INTRAVENOUS | Status: AC | PRN
  Administered 2012-09-03: 11 via INTRAVENOUS

## 2012-09-03 NOTE — Progress Notes (Signed)
[facility] SITE 3 NUCLEAR MED [address] [place], [place] [address] [phone]    Cardiology Nuclear Med Study  Anthony Kim. is a 68 y.o. male     MRN : [id number]     DOB: November 10, 1945  Procedure Date: 09/03/2012  Nuclear Med Background Indication for Stress Test:  Evaluation for Ischemia History:  08/15/12 ER with Dyspnea;CT:patchy coronary calcifications Cardiac Risk Factors: Family History - CAD, History of Smoking, Hypertension and Lipids  Symptoms:  Dizziness, DOE, Fatigue, Palpitations, Rapid HR and SOB   Nuclear Pre-Procedure Caffeine/Decaff Intake:  None NPO After: 7:00pm   Lungs:  clear O2 Sat: 95% on room air. IV 0.9% NS with Angio Cath:  20g  IV Site: R Hand  IV Started by:  Cathlyn Parsons, RN  Chest Size (in):  46 Cup Size: n/a  Height: 5\' 10"  (1.778 m)  Weight:  247 lb (112.038 kg)  BMI:  Body mass index is 35.44 kg/(m^2). Tech Comments:  n/a    Nuclear Med Study 1 or 2 day study: 1 day  Stress Test Type:  Stress  Reading MD: Willa Rough, MD  Order Authorizing Provider:  Annice Pih and Amy Bedsole,MD  Resting Radionuclide: Technetium 18m Sestamibi  Resting Radionuclide Dose: 11.0 mCi   Stress Radionuclide:  Technetium 25m Sestamibi  Stress Radionuclide Dose: 33.0 mCi           Stress Protocol Rest HR: 66 Stress HR: 148  Rest BP: 127/87 Stress BP: 200/84  Exercise Time (min): 7:01 METS: 7.0   Predicted Max HR: 152 bpm % Max HR: 97.37 bpm Rate Pressure Product: 72536   Dose of Adenosine (mg):  n/a Dose of Lexiscan: n/a mg  Dose of Atropine (mg): n/a Dose of Dobutamine: n/a mcg/kg/min (at max HR)  Stress Test Technologist: Cathlyn Parsons, RN  Nuclear Technologist:  Harlow Asa, CNMT     Rest Procedure:  Myocardial perfusion imaging was performed at rest 45 minutes following the intravenous administration of Technetium 82m Sestamibi. Rest ECG: NSR - Normal EKG  Stress Procedure:  The patient exercised on the  treadmill utilizing the Bruce Protocol for 7:01 minutes. The patient stopped due to hypertension and severe SOB and denied any chest pain.  Technetium 92m Sestamibi was injected at peak exercise and myocardial perfusion imaging was performed after a brief delay. Stress ECG: No significant change from baseline ECG  QPS Raw Data Images:  Normal; no motion artifact; normal heart/lung ratio. Stress Images:  Normal homogeneous uptake in all areas of the myocardium. Rest Images:  Normal homogeneous uptake in all areas of the myocardium. Subtraction (SDS):  No evidence of ischemia. Transient Ischemic Dilatation (Normal <1.22):  NA Lung/Heart Ratio (Normal <0.45):  0.47  Quantitative Gated Spect Images QGS EDV:  88 ml QGS ESV:  36 ml  Impression Exercise Capacity:  Fair exercise capacity. BP Response:  Hypertensive blood pressure response. Clinical Symptoms:  Shortness of breath ECG Impression:  No significant ST segment change suggestive of ischemia. Comparison with Prior Nuclear Study: No previous nuclear study performed  Overall Impression:  Normal stress nuclear study. There is no scar or ischemia. This is a low risk scan. When the patient was exercising near peak stress, there were a few episodes of 3 beats of supraventricular tachycardia.   LV Ejection Fraction: 59%.  LV Wall Motion:  Normal Wall Motion.  Willa Rough, MD

## 2012-09-05 ENCOUNTER — Encounter: Payer: Self-pay | Admitting: Cardiology

## 2012-09-05 ENCOUNTER — Ambulatory Visit (INDEPENDENT_AMBULATORY_CARE_PROVIDER_SITE_OTHER): Payer: Medicare Other | Admitting: Cardiology

## 2012-09-05 VITALS — BP 139/82 | HR 60 | Ht 70.0 in | Wt 247.0 lb

## 2012-09-05 DIAGNOSIS — I10 Essential (primary) hypertension: Secondary | ICD-10-CM | POA: Diagnosis not present

## 2012-09-05 DIAGNOSIS — G4730 Sleep apnea, unspecified: Secondary | ICD-10-CM

## 2012-09-05 DIAGNOSIS — I82409 Acute embolism and thrombosis of unspecified deep veins of unspecified lower extremity: Secondary | ICD-10-CM

## 2012-09-05 DIAGNOSIS — R0609 Other forms of dyspnea: Secondary | ICD-10-CM

## 2012-09-05 DIAGNOSIS — Z7901 Long term (current) use of anticoagulants: Secondary | ICD-10-CM

## 2012-09-05 DIAGNOSIS — E7800 Pure hypercholesterolemia, unspecified: Secondary | ICD-10-CM

## 2012-09-05 DIAGNOSIS — R0683 Snoring: Secondary | ICD-10-CM

## 2012-09-05 MED ORDER — LOSARTAN POTASSIUM 100 MG PO TABS: 100.0000 mg | ORAL_TABLET | Freq: Every day | ORAL | Status: DC

## 2012-09-05 NOTE — Patient Instructions (Addendum)
Your physician wants you to follow-up in: 1 YEAR WITH DR Billey Co will receive a reminder letter in the mail two months in advance. If you don't receive a letter, please call our office to schedule the follow-up appointment.   Your physician has recommended you make the following change in your medication:  INCREASE LOSARTAN FROM 50 MG TO 100 MG DAILY   You have been referred to DR [facility] FOR EVALUATION OF SNORING

## 2012-09-05 NOTE — Assessment & Plan Note (Addendum)
Suboptimal control. I've increased his losartan 100 mg a day.

## 2012-09-05 NOTE — Progress Notes (Signed)
Anthony Kim comes in today for followup of his chest discomfort. Chest CT showed calcification of the coronary arteries but no pulmonary embolus. He had a stress Myoview this week on August 18 which showed him to have good exercise tolerance, he reached 96% upper directed heart rate, his EF was 59% with normal Mukesh Kornegay motion and no evidence of ischemia. He did have an exaggerated blood pressure response going to 200/84.  I reviewed this with him today. I've increased his losartan to 100 mg a day. I'll have him followup on an annual basis with Dr.Gollan.

## 2012-09-05 NOTE — Assessment & Plan Note (Signed)
Sounds like he may have sleep apnea. We'll refer to Dr Shelle Iron.

## 2012-10-03 ENCOUNTER — Ambulatory Visit (INDEPENDENT_AMBULATORY_CARE_PROVIDER_SITE_OTHER): Payer: Medicare Other

## 2012-10-03 DIAGNOSIS — I82409 Acute embolism and thrombosis of unspecified deep veins of unspecified lower extremity: Secondary | ICD-10-CM | POA: Diagnosis not present

## 2012-10-03 DIAGNOSIS — Z7901 Long term (current) use of anticoagulants: Secondary | ICD-10-CM

## 2012-10-05 ENCOUNTER — Other Ambulatory Visit: Payer: Self-pay | Admitting: *Deleted

## 2012-10-05 MED ORDER — ALPRAZOLAM 0.5 MG PO TABS: 0.5000 mg | ORAL_TABLET | Freq: Three times a day (TID) | ORAL | Status: DC | PRN

## 2012-10-05 MED ORDER — ALPRAZOLAM ER 0.5 MG PO TB24: 0.5000 mg | ORAL_TABLET | Freq: Three times a day (TID) | ORAL | Status: DC

## 2012-10-05 NOTE — Telephone Encounter (Signed)
rx called into pharmacy

## 2012-10-05 NOTE — Telephone Encounter (Signed)
Ok to dispense 90 tablets with no refills but ideally he should be taking only one xr tablet daily.

## 2012-10-05 NOTE — Telephone Encounter (Signed)
Spoke with patient and he states he take the XR 1 tablet 3 times a day somedays, he may take 1 or 3 depending on the day. The pharmacist at [facility] he's been taking the XR. Please advise

## 2012-10-05 NOTE — Telephone Encounter (Signed)
Last filled 08/21/12

## 2012-10-09 ENCOUNTER — Institutional Professional Consult (permissible substitution): Payer: Medicare Other | Admitting: Pulmonary Disease

## 2012-11-06 ENCOUNTER — Other Ambulatory Visit: Payer: Self-pay | Admitting: *Deleted

## 2012-11-06 MED ORDER — ATORVASTATIN CALCIUM 20 MG PO TABS: 20.0000 mg | ORAL_TABLET | Freq: Every day | ORAL | Status: DC

## 2012-11-06 MED ORDER — ALPRAZOLAM ER 0.5 MG PO TB24: 0.5000 mg | ORAL_TABLET | Freq: Three times a day (TID) | ORAL | Status: DC

## 2012-11-06 NOTE — Telephone Encounter (Signed)
Ok to refill in Dr. Elmer Sow absence? Last filled 10/05/12.

## 2012-11-06 NOTE — Telephone Encounter (Signed)
plz phone in. 

## 2012-11-07 ENCOUNTER — Other Ambulatory Visit: Payer: Self-pay

## 2012-11-07 MED ORDER — OMEPRAZOLE 40 MG PO CPDR: 40.0000 mg | DELAYED_RELEASE_CAPSULE | Freq: Every day | ORAL | Status: DC

## 2012-11-07 NOTE — Telephone Encounter (Signed)
Rx called in as directed.   

## 2012-11-14 ENCOUNTER — Ambulatory Visit (INDEPENDENT_AMBULATORY_CARE_PROVIDER_SITE_OTHER): Payer: Medicare Other | Admitting: General Practice

## 2012-11-14 DIAGNOSIS — I82409 Acute embolism and thrombosis of unspecified deep veins of unspecified lower extremity: Secondary | ICD-10-CM

## 2012-11-14 DIAGNOSIS — Z7901 Long term (current) use of anticoagulants: Secondary | ICD-10-CM

## 2012-11-27 DIAGNOSIS — H2510 Age-related nuclear cataract, unspecified eye: Secondary | ICD-10-CM | POA: Diagnosis not present

## 2012-12-26 ENCOUNTER — Ambulatory Visit (INDEPENDENT_AMBULATORY_CARE_PROVIDER_SITE_OTHER): Payer: Medicare Other | Admitting: General Practice

## 2012-12-26 DIAGNOSIS — I82409 Acute embolism and thrombosis of unspecified deep veins of unspecified lower extremity: Secondary | ICD-10-CM | POA: Diagnosis not present

## 2012-12-26 DIAGNOSIS — Z7901 Long term (current) use of anticoagulants: Secondary | ICD-10-CM | POA: Diagnosis not present

## 2012-12-26 LAB — POCT INR: INR: 2.6

## 2012-12-27 ENCOUNTER — Other Ambulatory Visit: Payer: Self-pay | Admitting: *Deleted

## 2012-12-27 MED ORDER — FENOFIBRATE 145 MG PO TABS: 145.0000 mg | ORAL_TABLET | Freq: Every day | ORAL | Status: DC

## 2012-12-27 NOTE — Telephone Encounter (Signed)
Ok to refill 

## 2012-12-28 MED ORDER — ALPRAZOLAM ER 0.5 MG PO TB24: 0.5000 mg | ORAL_TABLET | Freq: Three times a day (TID) | ORAL | Status: DC

## 2012-12-28 NOTE — Telephone Encounter (Signed)
Rx called in as directed.   

## 2012-12-28 NOTE — Telephone Encounter (Signed)
Ok to phone in xanax 

## 2013-02-06 ENCOUNTER — Ambulatory Visit (INDEPENDENT_AMBULATORY_CARE_PROVIDER_SITE_OTHER): Payer: Medicare Other

## 2013-02-06 DIAGNOSIS — I82409 Acute embolism and thrombosis of unspecified deep veins of unspecified lower extremity: Secondary | ICD-10-CM | POA: Diagnosis not present

## 2013-02-06 DIAGNOSIS — Z7901 Long term (current) use of anticoagulants: Secondary | ICD-10-CM

## 2013-02-06 LAB — POCT INR: INR: 2.2

## 2013-02-07 ENCOUNTER — Other Ambulatory Visit: Payer: Self-pay | Admitting: *Deleted

## 2013-02-07 MED ORDER — BUPROPION HCL ER (XL) 150 MG PO TB24: 150.0000 mg | ORAL_TABLET | Freq: Every day | ORAL | Status: DC

## 2013-02-07 NOTE — Telephone Encounter (Signed)
Last office visit 08/16/2012 with Dr. Diona Browner.  Ok to refill?

## 2013-02-08 ENCOUNTER — Other Ambulatory Visit: Payer: Self-pay | Admitting: *Deleted

## 2013-02-08 MED ORDER — ALPRAZOLAM ER 0.5 MG PO TB24: 0.5000 mg | ORAL_TABLET | Freq: Three times a day (TID) | ORAL | Status: DC

## 2013-02-08 NOTE — Telephone Encounter (Signed)
Spoke to pt and informed him that Rx has been sent to pharmacy

## 2013-02-08 NOTE — Telephone Encounter (Signed)
OK to refill? Please send to [facility].

## 2013-02-11 ENCOUNTER — Other Ambulatory Visit: Payer: Self-pay | Admitting: *Deleted

## 2013-02-11 MED ORDER — WARFARIN SODIUM 5 MG PO TABS: 5.0000 mg | ORAL_TABLET | ORAL | Status: DC

## 2013-03-07 ENCOUNTER — Other Ambulatory Visit: Payer: Self-pay | Admitting: *Deleted

## 2013-03-07 MED ORDER — ALPRAZOLAM 0.5 MG PO TABS: 0.5000 mg | ORAL_TABLET | Freq: Three times a day (TID) | ORAL | Status: DC | PRN

## 2013-03-07 NOTE — Telephone Encounter (Signed)
Pt requesting medication refill. Last ov 03/2012 with no future appts scheduled. pls advise 

## 2013-03-08 NOTE — Telephone Encounter (Signed)
Spoke to pt and informed him Rx has been called in to requested pharmacy 

## 2013-03-20 ENCOUNTER — Ambulatory Visit (INDEPENDENT_AMBULATORY_CARE_PROVIDER_SITE_OTHER): Payer: Medicare Other

## 2013-03-20 DIAGNOSIS — Z7901 Long term (current) use of anticoagulants: Secondary | ICD-10-CM

## 2013-03-20 DIAGNOSIS — Z5181 Encounter for therapeutic drug level monitoring: Secondary | ICD-10-CM

## 2013-03-20 DIAGNOSIS — I82409 Acute embolism and thrombosis of unspecified deep veins of unspecified lower extremity: Secondary | ICD-10-CM

## 2013-03-20 LAB — POCT INR: INR: 2.6

## 2013-03-28 DIAGNOSIS — N400 Enlarged prostate without lower urinary tract symptoms: Secondary | ICD-10-CM | POA: Diagnosis not present

## 2013-03-28 DIAGNOSIS — N529 Male erectile dysfunction, unspecified: Secondary | ICD-10-CM | POA: Diagnosis not present

## 2013-04-03 ENCOUNTER — Other Ambulatory Visit: Payer: Self-pay | Admitting: Family Medicine

## 2013-04-03 ENCOUNTER — Encounter: Payer: Self-pay | Admitting: Family Medicine

## 2013-04-03 ENCOUNTER — Ambulatory Visit (INDEPENDENT_AMBULATORY_CARE_PROVIDER_SITE_OTHER): Payer: Medicare Other | Admitting: Family Medicine

## 2013-04-03 VITALS — BP 142/94 | HR 74 | Temp 98.2°F | Ht 70.0 in | Wt 250.5 lb

## 2013-04-03 DIAGNOSIS — J209 Acute bronchitis, unspecified: Secondary | ICD-10-CM

## 2013-04-03 MED ORDER — AZITHROMYCIN 250 MG PO TABS: ORAL_TABLET | ORAL | Status: DC

## 2013-04-03 MED ORDER — ALPRAZOLAM 0.5 MG PO TABS: 0.5000 mg | ORAL_TABLET | Freq: Three times a day (TID) | ORAL | Status: DC | PRN

## 2013-04-03 NOTE — Progress Notes (Signed)
Pre visit review using our clinic review tool, if applicable. No additional management support is needed unless otherwise documented below in the visit note. 

## 2013-04-03 NOTE — Progress Notes (Signed)
Subjective:    Patient ID: Anthony Conch., male    DOB: 02-23-1945, 68 y.o.   MRN: [id number]  HPI Here for uri since sat  ST Then hoarse (that improved) Now congested chest with green phlegm  No fever or chills or aches   Tried tylenol cold-did not help much  Also a D medicine   Patient Active Problem List   Diagnosis Date Noted  . Encounter for therapeutic drug monitoring 03/20/2013  . Sleep apnea 09/05/2012  . DOE (dyspnea on exertion) 08/16/2012  . Allergic rhinitis 03/22/2012  . Routine general medical examination at a health care facility 03/23/2011  . Anxiety 02/04/2011  . Deep vein thrombosis (DVT) 04/06/2010  . Long term current use of anticoagulant 04/06/2010  . History of DVT of lower extremity 07/18/2008  . Edema 06/26/2008  . SKIN CANCER, HX OF 10/30/2007  . ERECTILE DYSFUNCTION 04/30/2007  . COLONIC POLYPS 2/08 07/12/2006  . ARRHYTHMIA, HX OF 6/98 07/12/2006  . HYPERCHOLESTEROLEMIA 04/20/2006  . DEPRESSION 04/20/2006  . HYPERTENSION 04/20/2006   Past Medical History  Diagnosis Date  . Clotting disorder   . Hyperlipidemia   . Colon polyps     2008 Tubular Adenoma  . Anxiety   . IBS (irritable bowel syndrome)   . Hypertension   . Irregular heart beat    Past Surgical History  Procedure Laterality Date  . Appendectomy      1967  . Tonsillectomy and adenoidectomy    . Colonoscopy    . Polypectomy     History  Substance Use Topics  . Smoking status: Former Smoker    Types: Cigarettes  . Smokeless tobacco: Never Used  . Alcohol Use: Yes     Comment: occ   Family History  Problem Relation Age of Onset  . Heart disease Mother   . Hypertension Mother   . Heart attack Father   . Hypertension Father   . Prostate cancer Father   . Irritable bowel syndrome Mother    Allergies  Allergen Reactions  . Iodine Hives   Current Outpatient Prescriptions on File Prior to Visit  Medication Sig Dispense Refill  . acetaminophen (TYLENOL) 325 MG  tablet Take 325 mg by mouth every 6 (six) hours as needed for pain.       Marland Kitchen ALPRAZolam (XANAX XR) 0.5 MG 24 hr tablet Take 1 tablet (0.5 mg total) by mouth 3 (three) times daily.  90 tablet  0  . ALPRAZolam (XANAX) 0.5 MG tablet Take 1 tablet (0.5 mg total) by mouth 3 (three) times daily as needed for sleep.  90 tablet  0  . atorvastatin (LIPITOR) 20 MG tablet Take 1 tablet (20 mg total) by mouth daily.  30 tablet  6  . buPROPion (WELLBUTRIN XL) 150 MG 24 hr tablet Take 1 tablet (150 mg total) by mouth daily.  30 tablet  5  . fenofibrate (TRICOR) 145 MG tablet Take 1 tablet (145 mg total) by mouth daily.  30 tablet  5  . fexofenadine (ALLEGRA) 180 MG tablet Take 180 mg by mouth daily.      . fluticasone (FLONASE) 50 MCG/ACT nasal spray Place 2 sprays into the nose daily.  16 g  6  . losartan (COZAAR) 100 MG tablet Take 1 tablet (100 mg total) by mouth daily.  30 tablet  11  . Multiple Vitamin (MULTIVITAMIN PO) Take 1 tablet by mouth daily.        . Omega-3 Fatty Acids (FISH OIL) 1000  MG CAPS Take 1 capsule by mouth daily.        Marland Kitchen omeprazole (PRILOSEC) 40 MG capsule Take 1 capsule (40 mg total) by mouth daily.  30 capsule  3  . warfarin (COUMADIN) 5 MG tablet Take 1 tablet (5 mg total) by mouth See admin instructions.  45 tablet  3   No current facility-administered medications on file prior to visit.    Review of Systems Review of Systems  Constitutional: Negative for fever, appetite change, fatigue and unexpected weight change.  ENT pos for congestion and rhinorrhea/ neg for sinus pain or pressure  Eyes: Negative for pain and visual disturbance.  Respiratory: Negative for cough and shortness of breath.  pos for past hx of smoking  Cardiovascular: Negative for cp or palpitations    Gastrointestinal: Negative for nausea, diarrhea and constipation.  Genitourinary: Negative for urgency and frequency.  Skin: Negative for pallor or rash   Neurological: Negative for weakness, light-headedness,  numbness and headaches.  Hematological: Negative for adenopathy. Does not bruise/bleed easily.  Psychiatric/Behavioral: Negative for dysphoric mood. The patient is not nervous/anxious.         Objective:   Physical Exam  Constitutional: He appears well-developed and well-nourished. No distress.  obese and well appearing   HENT:  Head: Normocephalic and atraumatic.  Right Ear: External ear normal.  Left Ear: External ear normal.  Mouth/Throat: Oropharynx is clear and moist.  Nares are injected and congested  No sinus tenderness  Eyes: Conjunctivae and EOM are normal. Pupils are equal, round, and reactive to light. Right eye exhibits no discharge. Left eye exhibits no discharge. No scleral icterus.  Neck: Normal range of motion. Neck supple. No JVD present. Carotid bruit is not present. No thyromegaly present.  Cardiovascular: Normal rate, regular rhythm, normal heart sounds and intact distal pulses.  Exam reveals no gallop.   Pulmonary/Chest: Effort normal. No respiratory distress. He has wheezes. He has no rales. He exhibits no tenderness.  Harsh bs diffusely  Scant wheeze on forced exp only  No rales or rhonchi Good air exch   Abdominal: He exhibits no abdominal bruit.  Lymphadenopathy:    He has no cervical adenopathy.  Neurological: He is alert.  Skin: Skin is warm and dry. No rash noted. No erythema. No pallor.  Psychiatric: He has a normal mood and affect.          Assessment & Plan:

## 2013-04-03 NOTE — Patient Instructions (Signed)
You are taking an antibiotic so get your protime scheduled next week - make that appointment  Drink lots of fluids  Try robitussin DM or mucinex DM for congestion and cough Update if not starting to improve in a week or if worsening

## 2013-04-03 NOTE — Telephone Encounter (Signed)
Lm on pts vm informing him Rx has been called in to requested pharmacy 

## 2013-04-03 NOTE — Telephone Encounter (Signed)
Last filled 03/07/13 

## 2013-04-04 NOTE — Assessment & Plan Note (Signed)
In a former smoker with prod cough and purulent sputum  Disc symptomatic care - see instructions on AVS  Cover with zithromax  Disc its affect on INR -he will have that checked within the week  Update if not starting to improve in a week or if worsening

## 2013-04-05 ENCOUNTER — Other Ambulatory Visit: Payer: Self-pay

## 2013-04-05 MED ORDER — OMEPRAZOLE 40 MG PO CPDR: 40.0000 mg | DELAYED_RELEASE_CAPSULE | Freq: Every day | ORAL | Status: DC

## 2013-04-10 ENCOUNTER — Ambulatory Visit (INDEPENDENT_AMBULATORY_CARE_PROVIDER_SITE_OTHER): Payer: Medicare Other

## 2013-04-10 DIAGNOSIS — Z7901 Long term (current) use of anticoagulants: Secondary | ICD-10-CM | POA: Diagnosis not present

## 2013-04-10 DIAGNOSIS — I82409 Acute embolism and thrombosis of unspecified deep veins of unspecified lower extremity: Secondary | ICD-10-CM

## 2013-04-10 DIAGNOSIS — Z5181 Encounter for therapeutic drug level monitoring: Secondary | ICD-10-CM | POA: Diagnosis not present

## 2013-04-10 LAB — POCT INR: INR: 3

## 2013-05-13 ENCOUNTER — Other Ambulatory Visit: Payer: Self-pay

## 2013-05-13 MED ORDER — ALPRAZOLAM ER 0.5 MG PO TB24: 0.5000 mg | ORAL_TABLET | Freq: Three times a day (TID) | ORAL | Status: DC

## 2013-05-13 NOTE — Telephone Encounter (Signed)
Last filled 02/08/13--Last OV with you was 03/22/12 and pt had an acute appt with Dr Glori Bickers on 04/03/13--please advise

## 2013-05-13 NOTE — Telephone Encounter (Signed)
Lm on pts vm informing her Rx has been called in to requested pharmacy 

## 2013-05-22 ENCOUNTER — Ambulatory Visit (INDEPENDENT_AMBULATORY_CARE_PROVIDER_SITE_OTHER): Payer: Medicare Other

## 2013-05-22 DIAGNOSIS — Z7901 Long term (current) use of anticoagulants: Secondary | ICD-10-CM | POA: Diagnosis not present

## 2013-05-22 DIAGNOSIS — Z5181 Encounter for therapeutic drug level monitoring: Secondary | ICD-10-CM

## 2013-05-22 DIAGNOSIS — I82409 Acute embolism and thrombosis of unspecified deep veins of unspecified lower extremity: Secondary | ICD-10-CM

## 2013-05-22 LAB — POCT INR: INR: 2.3

## 2013-06-17 ENCOUNTER — Other Ambulatory Visit: Payer: Self-pay | Admitting: *Deleted

## 2013-06-17 MED ORDER — ALPRAZOLAM ER 0.5 MG PO TB24: 0.5000 mg | ORAL_TABLET | Freq: Three times a day (TID) | ORAL | Status: DC

## 2013-06-17 NOTE — Telephone Encounter (Signed)
Pt requesting medication refill. Last ov 03/2012 with no future appts scheduled. pls advise. Spoke to pt and informed him he will need an office visit for any additional refills

## 2013-06-17 NOTE — Telephone Encounter (Signed)
Rx called in to requested pharmacy 

## 2013-06-24 ENCOUNTER — Encounter: Payer: Self-pay | Admitting: Family Medicine

## 2013-06-24 ENCOUNTER — Telehealth: Payer: Self-pay | Admitting: Family Medicine

## 2013-06-24 ENCOUNTER — Ambulatory Visit (INDEPENDENT_AMBULATORY_CARE_PROVIDER_SITE_OTHER): Payer: Medicare Other | Admitting: Family Medicine

## 2013-06-24 VITALS — BP 114/62 | HR 65 | Temp 98.1°F | Ht 70.25 in | Wt 241.5 lb

## 2013-06-24 DIAGNOSIS — Z86718 Personal history of other venous thrombosis and embolism: Secondary | ICD-10-CM

## 2013-06-24 DIAGNOSIS — I10 Essential (primary) hypertension: Secondary | ICD-10-CM | POA: Diagnosis not present

## 2013-06-24 DIAGNOSIS — F419 Anxiety disorder, unspecified: Secondary | ICD-10-CM

## 2013-06-24 DIAGNOSIS — E7800 Pure hypercholesterolemia, unspecified: Secondary | ICD-10-CM

## 2013-06-24 NOTE — Progress Notes (Signed)
Pre visit review using our clinic review tool, if applicable. No additional management support is needed unless otherwise documented below in the visit note. 

## 2013-06-24 NOTE — Assessment & Plan Note (Signed)
Overdue for labs- Check direct ldl and CMET today- not fasting The patient indicates understanding of these issues and agrees with the plan.

## 2013-06-24 NOTE — Assessment & Plan Note (Signed)
Stable on current rx. No changes. 

## 2013-06-24 NOTE — Patient Instructions (Signed)
Good to see you. We will call you with your lab results.   

## 2013-06-24 NOTE — Telephone Encounter (Signed)
Relevant patient education assigned to patient using Emmi. ° °

## 2013-06-24 NOTE — Progress Notes (Signed)
68 yo pleasant male here follow up.  HTN- tolerating Losartan 100 mg daily.   Followed by Cardiology.  Last saw Dr. Verl Blalock in 08/2012 when he increased his losartan to 100 mg daily due to suboptimal control.  Has appt to establish care with Dr. Rockey Situ in 08/2013.  Lab Results  Component Value Date   CREATININE 1.21 08/15/2012  No CP, SOB or LE edema.     HLD- taking Lipitor and Tricor.  Overdue for labs.   Lab Results  Component Value Date   CHOL 175 03/22/2012   HDL 41.20 03/22/2012   LDLCALC 114* 03/22/2012   LDLDIRECT 149.4 08/21/2009   TRIG 100.0 03/22/2012   CHOLHDL 4 03/22/2012     Anxiety/ depression- Restarted Wellbutrin 150 mg XL daily in 07/2012. Continues to take prn xanax.   Wife has had some health problems but she is doing ok now. 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  h/o recurrent DVT- lifelong coumadin.   Patient Active Problem List   Diagnosis Date Noted  . Encounter for therapeutic drug monitoring 03/20/2013  . Sleep apnea 09/05/2012  . DOE (dyspnea on exertion) 08/16/2012  . Allergic rhinitis 03/22/2012  . Anxiety 02/04/2011  . Deep vein thrombosis (DVT) 04/06/2010  . Long term current use of anticoagulant 04/06/2010  . History of DVT of lower extremity 07/18/2008  . Edema 06/26/2008  . SKIN CANCER, HX OF 10/30/2007  . ERECTILE DYSFUNCTION 04/30/2007  . COLONIC POLYPS 2/08 07/12/2006  . ARRHYTHMIA, HX OF 6/98 07/12/2006  . HYPERCHOLESTEROLEMIA 04/20/2006  . DEPRESSION 04/20/2006  . HYPERTENSION 04/20/2006   Past Medical History  Diagnosis Date  . Clotting disorder   . Hyperlipidemia   . Colon polyps     2008 Tubular Adenoma  . Anxiety   . IBS (irritable bowel syndrome)   . Hypertension   . Irregular heart beat    Past Surgical History  Procedure Laterality Date  . Appendectomy      1967  . Tonsillectomy and adenoidectomy    . Colonoscopy    . Polypectomy     History  Substance Use Topics  . Smoking status: Former  Smoker    Types: Cigarettes  . Smokeless tobacco: Never Used  . Alcohol Use: Yes     Comment: occ   Family History  Problem Relation Age of Onset  . Heart disease Mother   . Hypertension Mother   . Heart attack Father   . Hypertension Father   . Prostate cancer Father   . Irritable bowel syndrome Mother    Allergies  Allergen Reactions  . Iodine Hives   Current Outpatient Prescriptions on File Prior to Visit  Medication Sig Dispense Refill  . acetaminophen (TYLENOL) 325 MG tablet Take 325 mg by mouth every 6 (six) hours as needed for pain.       Marland Kitchen ALPRAZolam (XANAX XR) 0.5 MG 24 hr tablet Take 1 tablet (0.5 mg total) by mouth 3 (three) times daily.  90 tablet  0  . atorvastatin (LIPITOR) 20 MG tablet Take 1 tablet (20 mg total) by mouth daily.  30 tablet  6  . buPROPion (WELLBUTRIN XL) 150 MG 24 hr tablet Take 1 tablet (150 mg total) by mouth daily.  30 tablet  5  . fenofibrate (TRICOR) 145 MG tablet Take 1 tablet (145 mg total) by mouth daily.  30 tablet  5  . fexofenadine (ALLEGRA) 180 MG tablet Take 180 mg by mouth daily.      Marland Kitchen  fluticasone (FLONASE) 50 MCG/ACT nasal spray Place 2 sprays into the nose daily.  16 g  6  . losartan (COZAAR) 100 MG tablet Take 1 tablet (100 mg total) by mouth daily.  30 tablet  11  . Multiple Vitamin (MULTIVITAMIN PO) Take 1 tablet by mouth daily.        . Omega-3 Fatty Acids (FISH OIL) 1000 MG CAPS Take 1 capsule by mouth daily.        Marland Kitchen omeprazole (PRILOSEC) 40 MG capsule Take 1 capsule (40 mg total) by mouth daily.  30 capsule  3  . warfarin (COUMADIN) 5 MG tablet Take 1 tablet (5 mg total) by mouth See admin instructions.  45 tablet  3   No current facility-administered medications on file prior to visit.   The PMH, PSH, Social History, Family History, Medications, and allergies have been reviewed in [facility], and have been updated if relevant.  Review of Systems  See HPI   No CP  No SOB Physical Exam  BP 114/62  Pulse 65  Temp(Src) 98.1  F (36.7 C) (Oral)  Ht 5' 10.25" (1.784 m)  Wt 241 lb 8 oz (109.544 kg)  BMI 34.42 kg/m2  SpO2 96%  General:  overweght male in NAD Eyes:  PERRL Ears:  External ear exam shows no significant lesions or deformities.  Otoscopic examination reveals clear canals, tympanic membranes are intact bilaterally without bulging, retraction, inflammation or discharge. Hearing is grossly normal bilaterally. Nose:  External nasal examination shows no deformity or inflammation. Nasal mucosa are pink and moist without lesions or exudates. Mouth:  Oral mucosa and oropharynx without lesions or exudates.  Teeth in good repair. Neck:  no carotid bruit or thyromegaly no cervical or supraclavicular lymphadenopathy  Lungs:  Normal respiratory effort, chest expands symmetrically. Lungs are clear to auscultation, no crackles or wheezes. Heart:  Normal rate and regular rhythm. S1 and S2 normal without gallop, murmur, click, rub or other extra sounds. Pulses:  R and L posterior tibial pulses are full and equal bilaterally  Extremities:  no edema

## 2013-06-24 NOTE — Assessment & Plan Note (Signed)
Well controlled on current rx. No changes. 

## 2013-06-24 NOTE — Assessment & Plan Note (Signed)
On lifelong anticoagulation- coumadin. INRs checked at cardiology.

## 2013-06-25 LAB — COMPREHENSIVE METABOLIC PANEL
ALT: 23 U/L (ref 0–53)
AST: 26 U/L (ref 0–37)
Albumin: 3.8 g/dL (ref 3.5–5.2)
Alkaline Phosphatase: 54 U/L (ref 39–117)
BILIRUBIN TOTAL: 0.5 mg/dL (ref 0.2–1.2)
BUN: 19 mg/dL (ref 6–23)
CO2: 24 mEq/L (ref 19–32)
CREATININE: 1.3 mg/dL (ref 0.4–1.5)
Calcium: 9 mg/dL (ref 8.4–10.5)
Chloride: 107 mEq/L (ref 96–112)
GFR: 57.67 mL/min — AB (ref >60.00)
GLUCOSE: 84 mg/dL (ref 70–99)
Potassium: 4.2 mEq/L (ref 3.5–5.1)
Sodium: 140 mEq/L (ref 135–145)
Total Protein: 6.6 g/dL (ref 6.0–8.3)

## 2013-06-25 LAB — LDL CHOLESTEROL, DIRECT: LDL DIRECT: 99.5 mg/dL

## 2013-06-27 ENCOUNTER — Encounter: Payer: Self-pay | Admitting: *Deleted

## 2013-07-03 ENCOUNTER — Ambulatory Visit (INDEPENDENT_AMBULATORY_CARE_PROVIDER_SITE_OTHER): Payer: Medicare Other

## 2013-07-03 DIAGNOSIS — Z7901 Long term (current) use of anticoagulants: Secondary | ICD-10-CM

## 2013-07-03 DIAGNOSIS — Z5181 Encounter for therapeutic drug level monitoring: Secondary | ICD-10-CM

## 2013-07-03 DIAGNOSIS — I82409 Acute embolism and thrombosis of unspecified deep veins of unspecified lower extremity: Secondary | ICD-10-CM

## 2013-07-03 LAB — POCT INR: INR: 2.8

## 2013-07-08 ENCOUNTER — Ambulatory Visit: Payer: Medicare Other | Admitting: Family Medicine

## 2013-07-31 ENCOUNTER — Other Ambulatory Visit: Payer: Self-pay | Admitting: *Deleted

## 2013-07-31 MED ORDER — ALPRAZOLAM ER 0.5 MG PO TB24: 0.5000 mg | ORAL_TABLET | Freq: Three times a day (TID) | ORAL | Status: DC

## 2013-07-31 MED ORDER — FENOFIBRATE 145 MG PO TABS: 145.0000 mg | ORAL_TABLET | Freq: Every day | ORAL | Status: DC

## 2013-07-31 NOTE — Telephone Encounter (Signed)
Pt requesting medication refill. Last f/u appt 06/2013 with no future appts scheduled. Pls advise

## 2013-07-31 NOTE — Telephone Encounter (Signed)
Lm on pts vm informing him Rx has been faxed to requested pharmacy 

## 2013-08-21 ENCOUNTER — Ambulatory Visit (INDEPENDENT_AMBULATORY_CARE_PROVIDER_SITE_OTHER): Payer: Medicare Other

## 2013-08-21 DIAGNOSIS — I82409 Acute embolism and thrombosis of unspecified deep veins of unspecified lower extremity: Secondary | ICD-10-CM

## 2013-08-21 DIAGNOSIS — Z5181 Encounter for therapeutic drug level monitoring: Secondary | ICD-10-CM

## 2013-08-21 DIAGNOSIS — Z7901 Long term (current) use of anticoagulants: Secondary | ICD-10-CM

## 2013-08-21 LAB — POCT INR: INR: 2.6

## 2013-08-26 ENCOUNTER — Other Ambulatory Visit: Payer: Self-pay | Admitting: *Deleted

## 2013-08-26 MED ORDER — ATORVASTATIN CALCIUM 20 MG PO TABS: 20.0000 mg | ORAL_TABLET | Freq: Every day | ORAL | Status: DC

## 2013-08-26 MED ORDER — BUPROPION HCL ER (XL) 150 MG PO TB24: 150.0000 mg | ORAL_TABLET | Freq: Every day | ORAL | Status: DC

## 2013-08-26 NOTE — Telephone Encounter (Signed)
Pt requesting medication refill. Last f/u appt 06/2013 with no future appts scheduled. pls advise

## 2013-08-27 ENCOUNTER — Other Ambulatory Visit: Payer: Self-pay

## 2013-08-27 MED ORDER — ALPRAZOLAM ER 0.5 MG PO TB24: 0.5000 mg | ORAL_TABLET | Freq: Three times a day (TID) | ORAL | Status: DC

## 2013-08-27 MED ORDER — OMEPRAZOLE 40 MG PO CPDR: 40.0000 mg | DELAYED_RELEASE_CAPSULE | Freq: Every day | ORAL | Status: DC

## 2013-08-27 NOTE — Telephone Encounter (Signed)
Rx faxed to requested pharmacy 

## 2013-08-27 NOTE — Telephone Encounter (Signed)
Refill sent for omeprazole.  

## 2013-08-30 ENCOUNTER — Other Ambulatory Visit: Payer: Self-pay | Admitting: Pharmacist

## 2013-08-30 MED ORDER — WARFARIN SODIUM 5 MG PO TABS: ORAL_TABLET | ORAL | Status: DC

## 2013-09-02 ENCOUNTER — Ambulatory Visit (INDEPENDENT_AMBULATORY_CARE_PROVIDER_SITE_OTHER): Payer: Medicare Other | Admitting: Cardiovascular Disease

## 2013-09-02 ENCOUNTER — Encounter: Payer: Self-pay | Admitting: Cardiovascular Disease

## 2013-09-02 VITALS — BP 130/82 | HR 69 | Ht 70.0 in | Wt 245.2 lb

## 2013-09-02 DIAGNOSIS — G4730 Sleep apnea, unspecified: Secondary | ICD-10-CM

## 2013-09-02 DIAGNOSIS — F411 Generalized anxiety disorder: Secondary | ICD-10-CM

## 2013-09-02 DIAGNOSIS — E785 Hyperlipidemia, unspecified: Secondary | ICD-10-CM | POA: Diagnosis not present

## 2013-09-02 DIAGNOSIS — R609 Edema, unspecified: Secondary | ICD-10-CM | POA: Diagnosis not present

## 2013-09-02 DIAGNOSIS — I82401 Acute embolism and thrombosis of unspecified deep veins of right lower extremity: Secondary | ICD-10-CM

## 2013-09-02 DIAGNOSIS — I10 Essential (primary) hypertension: Secondary | ICD-10-CM

## 2013-09-02 DIAGNOSIS — E7800 Pure hypercholesterolemia, unspecified: Secondary | ICD-10-CM

## 2013-09-02 DIAGNOSIS — F419 Anxiety disorder, unspecified: Secondary | ICD-10-CM

## 2013-09-02 DIAGNOSIS — I82409 Acute embolism and thrombosis of unspecified deep veins of unspecified lower extremity: Secondary | ICD-10-CM

## 2013-09-02 DIAGNOSIS — Z7901 Long term (current) use of anticoagulants: Secondary | ICD-10-CM

## 2013-09-02 MED ORDER — ATORVASTATIN CALCIUM 40 MG PO TABS: 40.0000 mg | ORAL_TABLET | Freq: Every day | ORAL | Status: DC

## 2013-09-02 NOTE — Progress Notes (Signed)
Patient ID: Anthony Mounsey., male    DOB: November 21, 1945, 68 y.o.   MRN: [id number]  HPI Comments: Anthony Kim is a pleasant 68 year old gentleman with a prior history of chest discomfort, chest CT scan showing coronary artery calcification, hyperlipidemia, hypertension, anxiety, stress test 09/03/2012 with no ischemia, normal ejection fraction, exaggerated blood pressure with walking who presents for followup in the [place] office.  He does report a history of DVT in the right lower extremity, on chronic warfarin  Overall he feels well with no complaints. He is active but no regular exercise program. Denies any shortness of breath, 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, no significant leg swelling. He wears compression hose given his history of DVT  EKG shows normal sinus rhythm with no significant ST or T wave changes    CREATININE  1.21  08/15/2012     CHOL  175  03/22/2012     HDL  41.20  03/22/2012     LDLCALC  114*  03/22/2012  , drop of 50 points with Lipitor 20 mg daily   TRIG  100.0  03/22/2012        Outpatient Encounter Prescriptions as of 09/02/2013  Medication Sig  . acetaminophen (TYLENOL) 325 MG tablet Take 325 mg by mouth every 6 (six) hours as needed for pain.   Marland Kitchen ALPRAZolam (XANAX XR) 0.5 MG 24 hr tablet Take 1 tablet (0.5 mg total) by mouth 3 (three) times daily.  Marland Kitchen atorvastatin (LIPITOR) 40 MG tablet Take 1 tablet (40 mg total) by mouth daily.  Marland Kitchen buPROPion (WELLBUTRIN XL) 150 MG 24 hr tablet Take 1 tablet (150 mg total) by mouth daily.  . fenofibrate (TRICOR) 145 MG tablet Take 1 tablet (145 mg total) by mouth daily.  . fexofenadine (ALLEGRA) 180 MG tablet Take 180 mg by mouth daily.  . fluticasone (FLONASE) 50 MCG/ACT nasal spray Place 2 sprays into the nose daily.  Marland Kitchen losartan (COZAAR) 100 MG tablet Take 1 tablet (100 mg total) by mouth daily.  . Multiple Vitamin (MULTIVITAMIN PO) Take 1 tablet by mouth daily.    . Omega-3 Fatty Acids (FISH OIL) 1000 MG CAPS Take 1 capsule by mouth daily.    Marland Kitchen  omeprazole (PRILOSEC) 40 MG capsule Take 1 capsule (40 mg total) by mouth daily.  Marland Kitchen warfarin (COUMADIN) 5 MG tablet Take as directed by Anticoagulation clinic  . [DISCONTINUED] atorvastatin (LIPITOR) 20 MG tablet Take 1 tablet (20 mg total) by mouth daily.     Review of Systems  Constitutional: Negative.   HENT: Negative.   Eyes: Negative.   Respiratory: Negative.   Cardiovascular: Negative.   Gastrointestinal: Negative.   Endocrine: Negative.   Musculoskeletal: Negative.   Skin: Negative.   Allergic/Immunologic: Negative.   Neurological: Negative.   Hematological: Negative.   Psychiatric/Behavioral: Negative.   All other systems reviewed and are negative.   BP 130/82  Pulse 69  Ht 5\' 10"  (1.778 m)  Wt 245 lb 4 oz (111.245 kg)  BMI 35.19 kg/m2  Physical Exam  Nursing note and vitals reviewed. Constitutional: He is oriented to person, place, and time. He appears well-developed and well-nourished.  HENT:  Head: Normocephalic.  Nose: Nose normal.  Mouth/Throat: Oropharynx is clear and moist.  Eyes: Conjunctivae are normal. Pupils are equal, round, and reactive to light.  Neck: Normal range of motion. Neck supple. No JVD present.  Cardiovascular: Normal rate, regular rhythm, S1 normal, S2 normal, normal heart sounds and intact distal pulses.  Exam reveals no gallop and no  friction rub.   No murmur heard. Pulmonary/Chest: Effort normal and breath sounds normal. No respiratory distress. He has no wheezes. He has no rales. He exhibits no tenderness.  Abdominal: Soft. Bowel sounds are normal. He exhibits no distension. There is no tenderness.  Musculoskeletal: Normal range of motion. He exhibits no edema and no tenderness.  Lymphadenopathy:    He has no cervical adenopathy.  Neurological: He is alert and oriented to person, place, and time. Coordination normal.  Skin: Skin is warm and dry. No rash noted. No erythema.  Psychiatric: He has a normal mood and affect. His behavior  is normal. Judgment and thought content normal.      Assessment and Plan

## 2013-09-02 NOTE — Assessment & Plan Note (Signed)
History of DVT, on long-term anticoagulation

## 2013-09-02 NOTE — Assessment & Plan Note (Signed)
Minimal edema on exam, much improved with tight dress socks. Likely exacerbated by DVT on the right

## 2013-09-02 NOTE — Assessment & Plan Note (Signed)
Tolerating long-term anticoagulation with no difficulty. No history of bleeding

## 2013-09-02 NOTE — Assessment & Plan Note (Signed)
Sleep habits were not discussed with him today.

## 2013-09-02 NOTE — Assessment & Plan Note (Signed)
Long discussion with him today concerning his cholesterol. We will increase his Lipitor up to 40 mg daily in an effort to drop his LDL down to 70 or less. Known coronary artery disease seen on CT scan

## 2013-09-02 NOTE — Assessment & Plan Note (Signed)
Seems to be doing well. Managed by primary care

## 2013-09-02 NOTE — Assessment & Plan Note (Signed)
Blood pressure is well controlled on today's visit. No changes made to the medications. 

## 2013-09-02 NOTE — Patient Instructions (Addendum)
You are doing well. Please increase the lipitor up to 40 mg daily Come in for cholesterol check in December 2015  Please call us if you have new issues that need to be addressed before your next appt.  Your physician wants you to follow-up in: 12 months.  You will receive a reminder letter in the mail two months in advance. If you don't receive a letter, please call our office to schedule the follow-up appointment.

## 2013-09-25 ENCOUNTER — Other Ambulatory Visit: Payer: Self-pay

## 2013-09-25 MED ORDER — LOSARTAN POTASSIUM 100 MG PO TABS: 100.0000 mg | ORAL_TABLET | Freq: Every day | ORAL | Status: DC

## 2013-10-11 ENCOUNTER — Ambulatory Visit (INDEPENDENT_AMBULATORY_CARE_PROVIDER_SITE_OTHER): Payer: Medicare Other | Admitting: *Deleted

## 2013-10-11 DIAGNOSIS — I82409 Acute embolism and thrombosis of unspecified deep veins of unspecified lower extremity: Secondary | ICD-10-CM | POA: Diagnosis not present

## 2013-10-11 DIAGNOSIS — Z5181 Encounter for therapeutic drug level monitoring: Secondary | ICD-10-CM | POA: Diagnosis not present

## 2013-10-11 DIAGNOSIS — Z7901 Long term (current) use of anticoagulants: Secondary | ICD-10-CM | POA: Diagnosis not present

## 2013-10-11 LAB — POCT INR: INR: 3.4

## 2013-10-23 ENCOUNTER — Other Ambulatory Visit: Payer: Self-pay | Admitting: *Deleted

## 2013-10-23 MED ORDER — ALPRAZOLAM ER 0.5 MG PO TB24: 0.5000 mg | ORAL_TABLET | Freq: Three times a day (TID) | ORAL | Status: DC

## 2013-10-23 NOTE — Telephone Encounter (Signed)
Pt requesting medication refill. Last f/u appt 06/2013. Ok to fill per Dr Deborra Medina. Rx to be faxed to requested pharmacy by end of day, 10/24/13.

## 2013-11-13 ENCOUNTER — Other Ambulatory Visit (INDEPENDENT_AMBULATORY_CARE_PROVIDER_SITE_OTHER): Payer: Medicare Other | Admitting: Family Medicine

## 2013-11-13 ENCOUNTER — Ambulatory Visit (INDEPENDENT_AMBULATORY_CARE_PROVIDER_SITE_OTHER): Payer: Medicare Other

## 2013-11-13 DIAGNOSIS — Z7901 Long term (current) use of anticoagulants: Secondary | ICD-10-CM

## 2013-11-13 DIAGNOSIS — Z5181 Encounter for therapeutic drug level monitoring: Secondary | ICD-10-CM

## 2013-11-13 DIAGNOSIS — I82409 Acute embolism and thrombosis of unspecified deep veins of unspecified lower extremity: Secondary | ICD-10-CM | POA: Diagnosis not present

## 2013-11-13 DIAGNOSIS — Z23 Encounter for immunization: Secondary | ICD-10-CM

## 2013-11-13 LAB — POCT INR: INR: 3.4

## 2013-11-26 ENCOUNTER — Other Ambulatory Visit: Payer: Self-pay | Admitting: *Deleted

## 2013-11-26 MED ORDER — ALPRAZOLAM ER 0.5 MG PO TB24: 0.5000 mg | ORAL_TABLET | Freq: Three times a day (TID) | ORAL | Status: DC

## 2013-11-26 NOTE — Telephone Encounter (Signed)
Rx called in to requested pharmacy 

## 2013-11-26 NOTE — Telephone Encounter (Signed)
Pt requesting medication refill. Last f/u appt 06/2013. pls advise

## 2013-12-04 ENCOUNTER — Ambulatory Visit (INDEPENDENT_AMBULATORY_CARE_PROVIDER_SITE_OTHER): Payer: Medicare Other

## 2013-12-04 DIAGNOSIS — I82409 Acute embolism and thrombosis of unspecified deep veins of unspecified lower extremity: Secondary | ICD-10-CM | POA: Diagnosis not present

## 2013-12-04 DIAGNOSIS — Z5181 Encounter for therapeutic drug level monitoring: Secondary | ICD-10-CM | POA: Diagnosis not present

## 2013-12-04 DIAGNOSIS — Z7901 Long term (current) use of anticoagulants: Secondary | ICD-10-CM | POA: Diagnosis not present

## 2013-12-04 LAB — POCT INR: INR: 2.3

## 2013-12-11 DIAGNOSIS — H2513 Age-related nuclear cataract, bilateral: Secondary | ICD-10-CM | POA: Diagnosis not present

## 2013-12-31 ENCOUNTER — Other Ambulatory Visit: Payer: Medicare Other

## 2014-01-01 ENCOUNTER — Ambulatory Visit (INDEPENDENT_AMBULATORY_CARE_PROVIDER_SITE_OTHER): Payer: Medicare Other

## 2014-01-01 ENCOUNTER — Other Ambulatory Visit (INDEPENDENT_AMBULATORY_CARE_PROVIDER_SITE_OTHER): Payer: Medicare Other

## 2014-01-01 DIAGNOSIS — Z7901 Long term (current) use of anticoagulants: Secondary | ICD-10-CM | POA: Diagnosis not present

## 2014-01-01 DIAGNOSIS — E785 Hyperlipidemia, unspecified: Secondary | ICD-10-CM | POA: Diagnosis not present

## 2014-01-01 DIAGNOSIS — Z5181 Encounter for therapeutic drug level monitoring: Secondary | ICD-10-CM

## 2014-01-01 DIAGNOSIS — I82409 Acute embolism and thrombosis of unspecified deep veins of unspecified lower extremity: Secondary | ICD-10-CM

## 2014-01-01 LAB — POCT INR: INR: 2.3

## 2014-01-02 LAB — LIPID PANEL
Chol/HDL Ratio: 3.3 ratio units (ref 0.0–5.0)
Cholesterol, Total: 162 mg/dL (ref 100–199)
HDL: 49 mg/dL (ref >39)
LDL Calculated: 91 mg/dL (ref 0–99)
Triglycerides: 112 mg/dL (ref 0–149)
VLDL Cholesterol Cal: 22 mg/dL (ref 5–40)

## 2014-01-29 ENCOUNTER — Other Ambulatory Visit: Payer: Self-pay

## 2014-01-29 MED ORDER — ALPRAZOLAM ER 0.5 MG PO TB24: 0.5000 mg | ORAL_TABLET | Freq: Three times a day (TID) | ORAL | Status: DC

## 2014-01-29 NOTE — Telephone Encounter (Signed)
Ann pts wife left v/m requesting cb to pts cell;  Anthony Kim said [place] ct has requested refills on alprazolam several times with no response. Ann request refill done ASAP. Pt last seen 06/24/13 and no future appt scheduled.

## 2014-01-29 NOTE — Telephone Encounter (Signed)
Ok to phone in xanax- Dr. Deborra Medina last note reviewed

## 2014-01-30 ENCOUNTER — Other Ambulatory Visit: Payer: Self-pay | Admitting: *Deleted

## 2014-01-30 NOTE — Telephone Encounter (Signed)
Xanax XR is once a day dosing. Do you want him on regular xanax?

## 2014-01-30 NOTE — Telephone Encounter (Signed)
i spoke to pharmacy to phone in Rx but she wanted me to confirm dosing. Med is a 24hr tab, but dosing states TID. pls advise

## 2014-01-31 NOTE — Telephone Encounter (Signed)
Spoke to pt and informed him to await the return of Dr Deborra Medina on Monday

## 2014-01-31 NOTE — Telephone Encounter (Addendum)
Anthony Kim left v/m requesting xanax xr be called in today and request cb. Pt is out of med.

## 2014-01-31 NOTE — Telephone Encounter (Signed)
I am awaiting response from Dr. Deborra Medina. I will not approve Xanax XR which is a 24 hour drug with instructions to take 3 times per day without Dr. Elonda Husky approval

## 2014-02-03 MED ORDER — ALPRAZOLAM ER 0.5 MG PO TB24
0.5000 mg | ORAL_TABLET | Freq: Three times a day (TID) | ORAL | Status: DC
Start: 2014-02-03 — End: 2014-03-05

## 2014-02-03 NOTE — Telephone Encounter (Signed)
Mrs Ertl request refill alprazolam xr done today; several request have been made; Mrs Dames said pt takes xanax xr 0.5 mg taking 1 tab three times a day. Mrs Hinkle request cb when refilled to [facility].

## 2014-02-03 NOTE — Telephone Encounter (Signed)
Medication phoned to pharmacy.  

## 2014-02-03 NOTE — Addendum Note (Signed)
Addended by: Helene Shoe on: 02/03/2014 03:09 PM   Modules accepted: Orders

## 2014-02-03 NOTE — Addendum Note (Signed)
Addended by: Lucille Passy on: 02/03/2014 04:16 PM   Modules accepted: Orders

## 2014-02-05 ENCOUNTER — Ambulatory Visit (INDEPENDENT_AMBULATORY_CARE_PROVIDER_SITE_OTHER): Payer: Medicare Other

## 2014-02-05 DIAGNOSIS — I82409 Acute embolism and thrombosis of unspecified deep veins of unspecified lower extremity: Secondary | ICD-10-CM

## 2014-02-05 DIAGNOSIS — Z7901 Long term (current) use of anticoagulants: Secondary | ICD-10-CM | POA: Diagnosis not present

## 2014-02-05 DIAGNOSIS — Z5181 Encounter for therapeutic drug level monitoring: Secondary | ICD-10-CM | POA: Diagnosis not present

## 2014-02-05 LAB — POCT INR: INR: 2

## 2014-02-14 NOTE — Telephone Encounter (Signed)
Pt req med refill of tricor

## 2014-03-05 ENCOUNTER — Other Ambulatory Visit: Payer: Self-pay | Admitting: *Deleted

## 2014-03-05 MED ORDER — FENOFIBRATE 145 MG PO TABS: 145.0000 mg | ORAL_TABLET | Freq: Every day | ORAL | Status: DC

## 2014-03-05 MED ORDER — BUPROPION HCL ER (XL) 150 MG PO TB24: 150.0000 mg | ORAL_TABLET | Freq: Every day | ORAL | Status: DC

## 2014-03-05 MED ORDER — ALPRAZOLAM ER 0.5 MG PO TB24: 0.5000 mg | ORAL_TABLET | Freq: Three times a day (TID) | ORAL | Status: DC

## 2014-03-05 NOTE — Telephone Encounter (Signed)
Last f/u appt 06/2013

## 2014-03-05 NOTE — Telephone Encounter (Signed)
Will need to make followup appt. Ok to phone in xanax

## 2014-03-06 ENCOUNTER — Other Ambulatory Visit: Payer: Self-pay | Admitting: *Deleted

## 2014-03-06 MED ORDER — WARFARIN SODIUM 5 MG PO TABS: ORAL_TABLET | ORAL | Status: DC

## 2014-03-06 NOTE — Telephone Encounter (Signed)
Rx called in to requested pharmacy 

## 2014-03-19 ENCOUNTER — Ambulatory Visit (INDEPENDENT_AMBULATORY_CARE_PROVIDER_SITE_OTHER): Payer: Medicare Other | Admitting: *Deleted

## 2014-03-19 DIAGNOSIS — Z5181 Encounter for therapeutic drug level monitoring: Secondary | ICD-10-CM | POA: Diagnosis not present

## 2014-03-19 DIAGNOSIS — I82409 Acute embolism and thrombosis of unspecified deep veins of unspecified lower extremity: Secondary | ICD-10-CM | POA: Diagnosis not present

## 2014-03-19 DIAGNOSIS — Z7901 Long term (current) use of anticoagulants: Secondary | ICD-10-CM | POA: Diagnosis not present

## 2014-03-19 LAB — POCT INR: INR: 2.1

## 2014-03-31 DIAGNOSIS — N400 Enlarged prostate without lower urinary tract symptoms: Secondary | ICD-10-CM | POA: Diagnosis not present

## 2014-03-31 DIAGNOSIS — N5201 Erectile dysfunction due to arterial insufficiency: Secondary | ICD-10-CM | POA: Diagnosis not present

## 2014-04-02 ENCOUNTER — Other Ambulatory Visit: Payer: Self-pay | Admitting: *Deleted

## 2014-04-02 MED ORDER — OMEPRAZOLE 40 MG PO CPDR: 40.0000 mg | DELAYED_RELEASE_CAPSULE | Freq: Every day | ORAL | Status: DC

## 2014-04-04 ENCOUNTER — Other Ambulatory Visit: Payer: Self-pay | Admitting: *Deleted

## 2014-04-04 MED ORDER — FENOFIBRATE 145 MG PO TABS: 145.0000 mg | ORAL_TABLET | Freq: Every day | ORAL | Status: DC

## 2014-04-04 NOTE — Telephone Encounter (Signed)
Alprazolam #90 last filled on 03/06/14, pt's last ov was a f/u on 06/24/13. No UDS or contract on file, and no future appointments scheduled.

## 2014-04-06 NOTE — Telephone Encounter (Signed)
Per PCP.  Thanks.

## 2014-04-07 MED ORDER — ALPRAZOLAM ER 0.5 MG PO TB24: 0.5000 mg | ORAL_TABLET | Freq: Three times a day (TID) | ORAL | Status: DC

## 2014-04-09 ENCOUNTER — Telehealth: Payer: Self-pay | Admitting: Family Medicine

## 2014-04-09 NOTE — Telephone Encounter (Signed)
I do not know what the issue is since I have never denied it.  I will route back to [place] and to [place] to look further into this.

## 2014-04-09 NOTE — Telephone Encounter (Signed)
Pt's wife called upset.  She said they are having a problem every month getting his alprazolam refilled.  He needs his medication filled.  She states it is past due.

## 2014-04-09 NOTE — Telephone Encounter (Signed)
Rena, Can you possibly send this in for this patient?  Thank you, Vincente Liberty

## 2014-04-09 NOTE — Telephone Encounter (Signed)
Alprazolam called to [place] by Elberta Leatherwood, [place].

## 2014-04-24 ENCOUNTER — Other Ambulatory Visit: Payer: Self-pay | Admitting: Family Medicine

## 2014-04-24 NOTE — Telephone Encounter (Signed)
Last OV was 06/24/2013, medication was last filled on 04/07/2014.

## 2014-04-24 NOTE — Telephone Encounter (Signed)
Spoke to pharmacist at [facility], enquiring about the medication needing a refill so early. The patient was just trying to make sure they have the medication when they will need it. If you approve you may want it to be noted with dispense on 05/10/2014.

## 2014-04-24 NOTE — Telephone Encounter (Signed)
Patient is to request a refill when it is closer to time. He is to early on requesting the refill today.

## 2014-04-30 ENCOUNTER — Ambulatory Visit (INDEPENDENT_AMBULATORY_CARE_PROVIDER_SITE_OTHER): Payer: Medicare Other

## 2014-04-30 DIAGNOSIS — Z7901 Long term (current) use of anticoagulants: Secondary | ICD-10-CM | POA: Diagnosis not present

## 2014-04-30 DIAGNOSIS — Z5181 Encounter for therapeutic drug level monitoring: Secondary | ICD-10-CM | POA: Diagnosis not present

## 2014-04-30 DIAGNOSIS — I82409 Acute embolism and thrombosis of unspecified deep veins of unspecified lower extremity: Secondary | ICD-10-CM

## 2014-04-30 LAB — POCT INR: INR: 2

## 2014-05-13 ENCOUNTER — Other Ambulatory Visit: Payer: Self-pay | Admitting: *Deleted

## 2014-05-13 MED ORDER — ALPRAZOLAM ER 0.5 MG PO TB24: 0.5000 mg | ORAL_TABLET | Freq: Three times a day (TID) | ORAL | Status: DC

## 2014-05-13 NOTE — Telephone Encounter (Signed)
Last f/u appt 06/2013

## 2014-05-13 NOTE — Telephone Encounter (Signed)
Rx called in to requested pharmacy 

## 2014-05-16 ENCOUNTER — Telehealth: Payer: Self-pay | Admitting: *Deleted

## 2014-05-16 NOTE — Telephone Encounter (Signed)
Fax received indicating PA is required for pts xanax due to quantity. Fax indicates pt should only be taking medication once daily as it is a 24hr tab. Rx is currently written for TID. pls change sig on medication or prescribe an alternate that is not XL/XR and can be taken more than once daily.

## 2014-05-16 NOTE — Telephone Encounter (Signed)
Spoke to pt and advised. Pt was not happy that medication has to be changed, but I advised him that unfortunately, a 24tab should only be taken once daily, which is the only amt his insurance will cover. Appt sched for 05/19/14

## 2014-05-16 NOTE — Telephone Encounter (Signed)
Please make pt aware of this and ask him which he would prefer.

## 2014-05-19 ENCOUNTER — Ambulatory Visit (INDEPENDENT_AMBULATORY_CARE_PROVIDER_SITE_OTHER): Payer: Medicare Other | Admitting: Family Medicine

## 2014-05-19 ENCOUNTER — Encounter: Payer: Self-pay | Admitting: Family Medicine

## 2014-05-19 VITALS — BP 130/82 | HR 67 | Temp 98.3°F | Wt 253.8 lb

## 2014-05-19 DIAGNOSIS — E785 Hyperlipidemia, unspecified: Secondary | ICD-10-CM | POA: Diagnosis not present

## 2014-05-19 DIAGNOSIS — F419 Anxiety disorder, unspecified: Secondary | ICD-10-CM

## 2014-05-19 DIAGNOSIS — E7800 Pure hypercholesterolemia, unspecified: Secondary | ICD-10-CM

## 2014-05-19 LAB — COMPREHENSIVE METABOLIC PANEL
ALK PHOS: 61 U/L (ref 39–117)
ALT: 24 U/L (ref 0–53)
AST: 23 U/L (ref 0–37)
Albumin: 4 g/dL (ref 3.5–5.2)
BUN: 17 mg/dL (ref 6–23)
CO2: 29 meq/L (ref 19–32)
Calcium: 9.3 mg/dL (ref 8.4–10.5)
Chloride: 105 mEq/L (ref 96–112)
Creatinine, Ser: 1.35 mg/dL (ref 0.40–1.50)
GFR: 55.55 mL/min — ABNORMAL LOW (ref >60.00)
Glucose, Bld: 103 mg/dL — ABNORMAL HIGH (ref 70–99)
POTASSIUM: 4.8 meq/L (ref 3.5–5.1)
Sodium: 138 mEq/L (ref 135–145)
Total Bilirubin: 0.5 mg/dL (ref 0.2–1.2)
Total Protein: 7 g/dL (ref 6.0–8.3)

## 2014-05-19 LAB — LIPID PANEL
CHOL/HDL RATIO: 4
Cholesterol: 179 mg/dL (ref 0–200)
HDL: 44.4 mg/dL (ref >39.00)
LDL Cholesterol: 107 mg/dL — ABNORMAL HIGH (ref 0–99)
NONHDL: 134.6
Triglycerides: 140 mg/dL (ref 0.0–149.0)
VLDL: 28 mg/dL (ref 0.0–40.0)

## 2014-05-19 MED ORDER — ALPRAZOLAM ER 0.5 MG PO TB24: 0.5000 mg | ORAL_TABLET | Freq: Every day | ORAL | Status: DC

## 2014-05-19 NOTE — Progress Notes (Signed)
Pre visit review using our clinic review tool, if applicable. No additional management support is needed unless otherwise documented below in the visit note. 

## 2014-05-19 NOTE — Assessment & Plan Note (Signed)
Well controlled. He would like labs rechecked today.

## 2014-05-19 NOTE — Patient Instructions (Signed)
Good to see you. We will call you with your lab results.   

## 2014-05-19 NOTE — Assessment & Plan Note (Signed)
Stable on current rx but no longer covered by insurance. Discussed options- he agreed to decrease xanax to 0.5 mg XR once daily.  May need to add prn short acting benzo.  He will update me in a few weeks.

## 2014-05-19 NOTE — Progress Notes (Signed)
69 yo pleasant male here follow up.  HTN- tolerating Losartan 100 mg daily.   Followed by Cardiology, Dr. Rockey Situ. Lab Results  Component Value Date   CREATININE 1.3 06/24/2013  No CP, SOB or LE edema.     HLD- taking Lipitor and Tricor.   Lab Results  Component Value Date   CHOL 162 01/01/2014   HDL 49 01/01/2014   LDLCALC 91 01/01/2014   LDLDIRECT 99.5 06/24/2013   TRIG 112 01/01/2014   CHOLHDL 3.3 01/01/2014     Anxiety/ depression- Restarted Wellbutrin 150 mg XL daily in 07/2012. Continues to take Xanax XR twice daily but insurance will no longer cover it.     h/o recurrent DVT- lifelong coumadin.   Patient Active Problem List   Diagnosis Date Noted  . Encounter for therapeutic drug monitoring 03/20/2013  . Sleep apnea 09/05/2012  . DOE (dyspnea on exertion) 08/16/2012  . Allergic rhinitis 03/22/2012  . Anxiety 02/04/2011  . Deep vein thrombosis (DVT) 04/06/2010  . Long term current use of anticoagulant 04/06/2010  . History of DVT of lower extremity 07/18/2008  . Edema 06/26/2008  . SKIN CANCER, HX OF 10/30/2007  . ERECTILE DYSFUNCTION 04/30/2007  . COLONIC POLYPS 2/08 07/12/2006  . ARRHYTHMIA, HX OF 6/98 07/12/2006  . HYPERCHOLESTEROLEMIA 04/20/2006  . DEPRESSION 04/20/2006  . HYPERTENSION 04/20/2006   Past Medical History  Diagnosis Date  . Clotting disorder   . Hyperlipidemia   . Colon polyps     2008 Tubular Adenoma  . Anxiety   . IBS (irritable bowel syndrome)   . Hypertension   . Irregular heart beat    Past Surgical History  Procedure Laterality Date  . Appendectomy      1967  . Tonsillectomy and adenoidectomy    . Colonoscopy    . Polypectomy     History  Substance Use Topics  . Smoking status: Former Smoker    Types: Cigarettes  . Smokeless tobacco: Never Used  . Alcohol Use: Yes     Comment: occ   Family History  Problem Relation Age of Onset  . Heart disease Mother   . Hypertension Mother   . Heart attack Father   .  Hypertension Father   . Prostate cancer Father   . Irritable bowel syndrome Mother    Allergies  Allergen Reactions  . Iodine Hives   Current Outpatient Prescriptions on File Prior to Visit  Medication Sig Dispense Refill  . acetaminophen (TYLENOL) 325 MG tablet Take 325 mg by mouth every 6 (six) hours as needed for pain.     Marland Kitchen atorvastatin (LIPITOR) 40 MG tablet Take 1 tablet (40 mg total) by mouth daily. 30 tablet 11  . buPROPion (WELLBUTRIN XL) 150 MG 24 hr tablet Take 1 tablet (150 mg total) by mouth daily. 30 tablet 5  . fenofibrate (TRICOR) 145 MG tablet Take 1 tablet (145 mg total) by mouth daily. 30 tablet 0  . fexofenadine (ALLEGRA) 180 MG tablet Take 180 mg by mouth daily.    . fluticasone (FLONASE) 50 MCG/ACT nasal spray Place 2 sprays into the nose daily. 16 g 6  . losartan (COZAAR) 100 MG tablet Take 1 tablet (100 mg total) by mouth daily. 30 tablet 11  . Multiple Vitamin (MULTIVITAMIN PO) Take 1 tablet by mouth daily.      . Omega-3 Fatty Acids (FISH OIL) 1000 MG CAPS Take 1 capsule by mouth daily.      Marland Kitchen omeprazole (PRILOSEC) 40 MG capsule Take 1 capsule (  40 mg total) by mouth daily. 30 capsule 3  . warfarin (COUMADIN) 5 MG tablet Take as directed by Anticoagulation clinic 45 tablet 3   No current facility-administered medications on file prior to visit.   The PMH, PSH, Social History, Family History, Medications, and allergies have been reviewed in [facility], and have been updated if relevant.  Review of Systems  See HPI   Review of Systems  Constitutional: Negative.   HENT: Negative.   Respiratory: Negative.   Cardiovascular: Negative.   Musculoskeletal: Negative.   Skin: Negative.   Neurological: Negative.   Hematological: Negative.   Psychiatric/Behavioral: Negative.     Physical Exam  BP 130/82 mmHg  Pulse 67  Temp(Src) 98.3 F (36.8 C) (Oral)  Wt 253 lb 12 oz (115.1 kg)  SpO2 98%  General:  overweght male in NAD Eyes:  PERRL Ears:  External ear exam  shows no significant lesions or deformities.  Otoscopic examination reveals clear canals, tympanic membranes are intact bilaterally without bulging, retraction, inflammation or discharge. Hearing is grossly normal bilaterally. Nose:  External nasal examination shows no deformity or inflammation. Nasal mucosa are pink and moist without lesions or exudates. Mouth:  Oral mucosa and oropharynx without lesions or exudates.  Teeth in good repair. Neck:  no carotid bruit or thyromegaly no cervical or supraclavicular lymphadenopathy  Lungs:  Normal respiratory effort, chest expands symmetrically. Lungs are clear to auscultation, no crackles or wheezes. Heart:  Normal rate and regular rhythm. S1 and S2 normal without gallop, murmur, click, rub or other extra sounds. Pulses:  R and L posterior tibial pulses are full and equal bilaterally  Extremities:  no edema

## 2014-06-18 ENCOUNTER — Ambulatory Visit (INDEPENDENT_AMBULATORY_CARE_PROVIDER_SITE_OTHER): Payer: Medicare Other

## 2014-06-18 DIAGNOSIS — Z7901 Long term (current) use of anticoagulants: Secondary | ICD-10-CM | POA: Diagnosis not present

## 2014-06-18 DIAGNOSIS — Z5181 Encounter for therapeutic drug level monitoring: Secondary | ICD-10-CM

## 2014-06-18 DIAGNOSIS — I82409 Acute embolism and thrombosis of unspecified deep veins of unspecified lower extremity: Secondary | ICD-10-CM

## 2014-06-18 LAB — POCT INR: INR: 1.9

## 2014-06-23 ENCOUNTER — Encounter: Payer: Self-pay | Admitting: Family Medicine

## 2014-06-23 ENCOUNTER — Encounter: Payer: Self-pay | Admitting: *Deleted

## 2014-06-23 ENCOUNTER — Ambulatory Visit (INDEPENDENT_AMBULATORY_CARE_PROVIDER_SITE_OTHER): Payer: Medicare Other | Admitting: Family Medicine

## 2014-06-23 VITALS — BP 120/74 | HR 62 | Temp 97.9°F | Ht 70.0 in | Wt 244.2 lb

## 2014-06-23 DIAGNOSIS — R944 Abnormal results of kidney function studies: Secondary | ICD-10-CM | POA: Diagnosis not present

## 2014-06-23 DIAGNOSIS — M25561 Pain in right knee: Secondary | ICD-10-CM | POA: Diagnosis not present

## 2014-06-23 DIAGNOSIS — D126 Benign neoplasm of colon, unspecified: Secondary | ICD-10-CM | POA: Diagnosis not present

## 2014-06-23 DIAGNOSIS — G4730 Sleep apnea, unspecified: Secondary | ICD-10-CM

## 2014-06-23 DIAGNOSIS — N289 Disorder of kidney and ureter, unspecified: Secondary | ICD-10-CM | POA: Insufficient documentation

## 2014-06-23 DIAGNOSIS — I10 Essential (primary) hypertension: Secondary | ICD-10-CM

## 2014-06-23 DIAGNOSIS — Z86718 Personal history of other venous thrombosis and embolism: Secondary | ICD-10-CM | POA: Diagnosis not present

## 2014-06-23 DIAGNOSIS — Z0000 Encounter for general adult medical examination without abnormal findings: Secondary | ICD-10-CM | POA: Insufficient documentation

## 2014-06-23 LAB — BASIC METABOLIC PANEL
BUN: 18 mg/dL (ref 6–23)
CHLORIDE: 105 meq/L (ref 96–112)
CO2: 30 mEq/L (ref 19–32)
Calcium: 9.4 mg/dL (ref 8.4–10.5)
Creatinine, Ser: 1.29 mg/dL (ref 0.40–1.50)
GFR: 58.53 mL/min — AB (ref >60.00)
Glucose, Bld: 101 mg/dL — ABNORMAL HIGH (ref 70–99)
Potassium: 4.8 mEq/L (ref 3.5–5.1)
Sodium: 140 mEq/L (ref 135–145)

## 2014-06-23 NOTE — Assessment & Plan Note (Signed)
Hopefully resolved now that he has eliminated NSAIDs. Recheck today. Orders Placed This Encounter  Procedures  . Basic Metabolic Panel

## 2014-06-23 NOTE — Assessment & Plan Note (Signed)
The patients weight, height, BMI and visual acuity have been recorded in the chart.  Cognitive function assessed.   I have made referrals, counseling and provided education to the patient based review of the above and I have provided the pt with a written personalized care plan for preventive services.  

## 2014-06-23 NOTE — Assessment & Plan Note (Signed)
New- likely aggrevated old meniscal injury.  Exam reassuring and symptoms gradually improving. He declined knee xray today which is reasonable.  If symptoms worsen, he plans to follow up directly with Dr. Elmyra Ricks.

## 2014-06-23 NOTE — Assessment & Plan Note (Signed)
Well controlled.  No changes made. 

## 2014-06-23 NOTE — Progress Notes (Signed)
69 yo pleasant male here for medicare wellness visit, follow up of chronic medical conditions and for complaint of right knee pain.  I have personally reviewed the Medicare Annual Wellness questionnaire and have noted 1. The patient's medical and social history 2. Their use of alcohol, tobacco or illicit drugs 3. Their current medications and supplements 4. The patient's functional ability including ADL's, fall risks, home safety risks and hearing or visual             impairment. 5. Diet and physical activities 6. Evidence for depression or mood disorders  End of life wishes discussed and updated in Social History.  The roster of all physicians providing medical care to patient - is listed in the CareTeams section of the chart.  Colonoscopy 3/28/13Deatra Ina, 70 year recall (polpys). zostavax 10/28/2008 Pneumovax 03/23/11 Prevnar 13 11/13/13 Td 09/16/08 Eye exam 07/2013- [facility]  Followed by urology- just had DRE and PSA per pt.  HTN- tolerating Losartan 100 mg daily.   Followed by Cardiology, Dr. Rockey Situ. Cr and GFR mildly decreased last month- he has stopped taking NSAIDs, instead taking prn Tylenol and drinking more water.  Lab Results  Component Value Date   CREATININE 1.35 05/19/2014  No CP, SOB or LE edema.   Has been working on his diet- cutting out salty and fatty foods.  Has already lost almost 10 pounds.  Wt Readings from Last 3 Encounters:  06/23/14 244 lb 4 oz (110.791 kg)  05/19/14 253 lb 12 oz (115.1 kg)  09/02/13 245 lb 4 oz (111.245 kg)    HLD- taking Lipitor and Tricor.   Lab Results  Component Value Date   CHOL 179 05/19/2014   HDL 44.40 05/19/2014   LDLCALC 107* 05/19/2014   LDLDIRECT 99.5 06/24/2013   TRIG 140.0 05/19/2014   CHOLHDL 4 05/19/2014    Anxiety/ depression- Restarted Wellbutrin 150 mg XL daily in 07/2012. Continues to take Xanax XR daily.  Right knee pain- h/o of right meniscal tear years ago.  Saw Dr. Elmyra Ricks.  Per pt, was told he could  have surgery or just "deal with it."  He chose not to have surgery and was managing ok.  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  Felt something pop, no swelling.  Immediate pain worse with movement.  Wearing a brace with activity , keeping leg elevated at night, and symptoms have been gradually improving.  h/o recurrent DVT- lifelong coumadin. Dosage recently increased- INR was low at 1/9. Lab Results  Component Value Date   INR 1.9 06/18/2014   INR 2.0 04/30/2014   INR 2.1 03/19/2014     Patient Active Problem List   Diagnosis Date Noted  . Medicare annual wellness visit, subsequent 06/23/2014  . Right knee pain 06/23/2014  . Encounter for therapeutic drug monitoring 03/20/2013  . Sleep apnea 09/05/2012  . Allergic rhinitis 03/22/2012  . Anxiety 02/04/2011  . Deep vein thrombosis (DVT) 04/06/2010  . Long term current use of anticoagulant 04/06/2010  . History of DVT of lower extremity 07/18/2008  . Edema 06/26/2008  . SKIN CANCER, HX OF 10/30/2007  . ERECTILE DYSFUNCTION 04/30/2007  . COLONIC POLYPS 2/08 07/12/2006  . ARRHYTHMIA, HX OF 6/98 07/12/2006  . HYPERCHOLESTEROLEMIA 04/20/2006  . DEPRESSION 04/20/2006  . Essential hypertension 04/20/2006   Past Medical History  Diagnosis Date  . Clotting disorder   . Hyperlipidemia   . Colon polyps     2008 Tubular Adenoma  . Anxiety   . IBS (  irritable bowel syndrome)   . Hypertension   . Irregular heart beat    Past Surgical History  Procedure Laterality Date  . Appendectomy      1967  . Tonsillectomy and adenoidectomy    . Colonoscopy    . Polypectomy     History  Substance Use Topics  . Smoking status: Former Smoker    Types: Cigarettes  . Smokeless tobacco: Never Used  . Alcohol Use: Yes     Comment: occ   Family History  Problem Relation Age of Onset  . Heart disease Mother   . Hypertension Mother   . Heart attack Father   . Hypertension Father   . Prostate cancer Father   .  Irritable bowel syndrome Mother    Allergies  Allergen Reactions  . Iodine Hives   Current Outpatient Prescriptions on File Prior to Visit  Medication Sig Dispense Refill  . acetaminophen (TYLENOL) 325 MG tablet Take 325 mg by mouth every 6 (six) hours as needed for pain.     Marland Kitchen ALPRAZolam (XANAX XR) 0.5 MG 24 hr tablet Take 1 tablet (0.5 mg total) by mouth daily. 90 tablet 3  . atorvastatin (LIPITOR) 40 MG tablet Take 1 tablet (40 mg total) by mouth daily. 30 tablet 11  . buPROPion (WELLBUTRIN XL) 150 MG 24 hr tablet Take 1 tablet (150 mg total) by mouth daily. 30 tablet 5  . fenofibrate (TRICOR) 145 MG tablet Take 1 tablet (145 mg total) by mouth daily. 30 tablet 0  . fexofenadine (ALLEGRA) 180 MG tablet Take 180 mg by mouth daily.    . fluticasone (FLONASE) 50 MCG/ACT nasal spray Place 2 sprays into the nose daily. 16 g 6  . losartan (COZAAR) 100 MG tablet Take 1 tablet (100 mg total) by mouth daily. 30 tablet 11  . Multiple Vitamin (MULTIVITAMIN PO) Take 1 tablet by mouth daily.      . Omega-3 Fatty Acids (FISH OIL) 1000 MG CAPS Take 1 capsule by mouth daily.      Marland Kitchen omeprazole (PRILOSEC) 40 MG capsule Take 1 capsule (40 mg total) by mouth daily. 30 capsule 3  . warfarin (COUMADIN) 5 MG tablet Take as directed by Anticoagulation clinic 45 tablet 3   No current facility-administered medications on file prior to visit.   The PMH, PSH, Social History, Family History, Medications, and allergies have been reviewed in [facility], and have been updated if relevant.  Review of Systems  See HPI   Review of Systems  Constitutional: Negative.   HENT: Negative.   Eyes: Negative.   Respiratory: Negative.   Cardiovascular: Negative.   Gastrointestinal: Negative.   Endocrine: Negative.   Genitourinary: Negative.   Musculoskeletal: Positive for arthralgias. Negative for joint swelling.  Skin: Negative.   Allergic/Immunologic: Negative.   Neurological: Negative.   Hematological: Negative.    Psychiatric/Behavioral: Negative.     Physical Exam  BP 120/74 mmHg  Pulse 62  Temp(Src) 97.9 F (36.6 C) (Oral)  Ht 5\' 10"  (1.778 m)  Wt 244 lb 4 oz (110.791 kg)  BMI 35.05 kg/m2  SpO2 97%  General:  overweght male in NAD Eyes:  PERRL Ears:  External ear exam shows no significant lesions or deformities.  Otoscopic examination reveals clear canals, tympanic membranes are intact bilaterally without bulging, retraction, inflammation or discharge. Hearing is grossly normal bilaterally. Nose:  External nasal examination shows no deformity or inflammation. Nasal mucosa are pink and moist without lesions or exudates. Mouth:  Oral mucosa and oropharynx  without lesions or exudates.  Teeth in good repair. Neck:  no carotid bruit or thyromegaly no cervical or supraclavicular lymphadenopathy  Lungs:  Normal respiratory effort, chest expands symmetrically. Lungs are clear to auscultation, no crackles or wheezes. Heart:  Normal rate and regular rhythm. S1 and S2 normal without gallop, murmur, click, rub or other extra sounds. Pulses:  R and L posterior tibial pulses are full and equal bilaterally  Extremities:  no edema  MSK:  Right knee- no swelling or TTP over joint lines. Mild crepitus, neg grind

## 2014-06-23 NOTE — Assessment & Plan Note (Signed)
Followed by Dr. Deatra Ina. Colonoscopy UTD. Denies changes in his bowel habits or blood in his stool.

## 2014-06-23 NOTE — Patient Instructions (Signed)
Good to see you. We will call you with your lab results.   

## 2014-06-24 ENCOUNTER — Other Ambulatory Visit: Payer: Self-pay | Admitting: *Deleted

## 2014-06-24 MED ORDER — FENOFIBRATE 145 MG PO TABS: 145.0000 mg | ORAL_TABLET | Freq: Every day | ORAL | Status: DC

## 2014-07-22 ENCOUNTER — Other Ambulatory Visit: Payer: Self-pay

## 2014-07-22 MED ORDER — ATORVASTATIN CALCIUM 40 MG PO TABS: 40.0000 mg | ORAL_TABLET | Freq: Every day | ORAL | Status: DC

## 2014-07-22 NOTE — Telephone Encounter (Signed)
Refill sent for atorvastatin. 

## 2014-07-30 ENCOUNTER — Ambulatory Visit (INDEPENDENT_AMBULATORY_CARE_PROVIDER_SITE_OTHER): Payer: Medicare Other

## 2014-07-30 DIAGNOSIS — Z7901 Long term (current) use of anticoagulants: Secondary | ICD-10-CM

## 2014-07-30 DIAGNOSIS — Z5181 Encounter for therapeutic drug level monitoring: Secondary | ICD-10-CM

## 2014-07-30 DIAGNOSIS — I82409 Acute embolism and thrombosis of unspecified deep veins of unspecified lower extremity: Secondary | ICD-10-CM | POA: Diagnosis not present

## 2014-07-30 LAB — POCT INR: INR: 2.6

## 2014-08-20 ENCOUNTER — Other Ambulatory Visit: Payer: Self-pay | Admitting: *Deleted

## 2014-08-20 MED ORDER — OMEPRAZOLE 40 MG PO CPDR: 40.0000 mg | DELAYED_RELEASE_CAPSULE | Freq: Every day | ORAL | Status: DC

## 2014-08-22 ENCOUNTER — Other Ambulatory Visit: Payer: Self-pay | Admitting: *Deleted

## 2014-08-22 MED ORDER — WARFARIN SODIUM 5 MG PO TABS: ORAL_TABLET | ORAL | Status: DC

## 2014-08-22 MED ORDER — BUPROPION HCL ER (XL) 150 MG PO TB24: 150.0000 mg | ORAL_TABLET | Freq: Every day | ORAL | Status: DC

## 2014-09-09 ENCOUNTER — Encounter: Payer: Self-pay | Admitting: Cardiovascular Disease

## 2014-09-09 ENCOUNTER — Ambulatory Visit (INDEPENDENT_AMBULATORY_CARE_PROVIDER_SITE_OTHER): Payer: Medicare Other

## 2014-09-09 ENCOUNTER — Ambulatory Visit (INDEPENDENT_AMBULATORY_CARE_PROVIDER_SITE_OTHER): Payer: Medicare Other | Admitting: Cardiovascular Disease

## 2014-09-09 VITALS — BP 118/88 | HR 61 | Ht 72.0 in | Wt 247.5 lb

## 2014-09-09 DIAGNOSIS — I499 Cardiac arrhythmia, unspecified: Secondary | ICD-10-CM

## 2014-09-09 DIAGNOSIS — E669 Obesity, unspecified: Secondary | ICD-10-CM | POA: Insufficient documentation

## 2014-09-09 DIAGNOSIS — I82409 Acute embolism and thrombosis of unspecified deep veins of unspecified lower extremity: Secondary | ICD-10-CM

## 2014-09-09 DIAGNOSIS — E7800 Pure hypercholesterolemia, unspecified: Secondary | ICD-10-CM

## 2014-09-09 DIAGNOSIS — Z7901 Long term (current) use of anticoagulants: Secondary | ICD-10-CM

## 2014-09-09 DIAGNOSIS — I10 Essential (primary) hypertension: Secondary | ICD-10-CM

## 2014-09-09 DIAGNOSIS — Z5181 Encounter for therapeutic drug level monitoring: Secondary | ICD-10-CM

## 2014-09-09 DIAGNOSIS — R609 Edema, unspecified: Secondary | ICD-10-CM | POA: Diagnosis not present

## 2014-09-09 LAB — POCT INR: INR: 4.2

## 2014-09-09 MED ORDER — EZETIMIBE 10 MG PO TABS: 10.0000 mg | ORAL_TABLET | Freq: Every day | ORAL | Status: DC

## 2014-09-09 NOTE — Assessment & Plan Note (Signed)
We have encouraged continued exercise, careful diet management in an effort to lose weight. 

## 2014-09-09 NOTE — Progress Notes (Signed)
Patient ID: Anthony Bonito., male    DOB: [birth year], 69 y.o.   MRN: [id number]  HPI Comments: Anthony Kim is a pleasant 69 year old gentleman with a prior history of chest discomfort, chest CT scan showing coronary artery calcification, hyperlipidemia, hypertension, anxiety, stress test 09/03/2012 with no ischemia, normal ejection fraction, exaggerated blood pressure with walking who presents for followup  for coronary artery disease, hypertension He does report a history of DVT in the right lower extremity, on chronic warfarin  He smoked for 22 years when he was younger In follow-up today, he reports that he is doing well. He is active, works on a farm. No regular exercise program. Reports his blood pressures well controlled. Overall has no complaints  Prior CT scan of the chest from 2014 reviewed with him again in detail, images reviewed There is LAD and left circumflex coronary artery disease  EKG on today's visit shows normal sinus rhythm with rate 61 bpm, no significant ST or T-wave changes  Other past medical history  He wears compression hose given his history of DVT    Allergies  Allergen Reactions  . Iodine Hives    Current Outpatient Prescriptions on File Prior to Visit  Medication Sig Dispense Refill  . acetaminophen (TYLENOL) 325 MG tablet Take 325 mg by mouth every 6 (six) hours as needed for pain.     Marland Kitchen ALPRAZolam (XANAX XR) 0.5 MG 24 hr tablet Take 1 tablet (0.5 mg total) by mouth daily. 90 tablet 3  . atorvastatin (LIPITOR) 40 MG tablet Take 1 tablet (40 mg total) by mouth daily. 30 tablet 3  . buPROPion (WELLBUTRIN XL) 150 MG 24 hr tablet Take 1 tablet (150 mg total) by mouth daily. 30 tablet 5  . fenofibrate (TRICOR) 145 MG tablet Take 1 tablet (145 mg total) by mouth daily. 30 tablet 5  . fexofenadine (ALLEGRA) 180 MG tablet Take 180 mg by mouth daily.    . fluticasone (FLONASE) 50 MCG/ACT nasal spray Place 2 sprays into the nose daily. (Patient taking  differently: Place 2 sprays into the nose as needed. ) 16 g 6  . losartan (COZAAR) 100 MG tablet Take 1 tablet (100 mg total) by mouth daily. 30 tablet 11  . Multiple Vitamin (MULTIVITAMIN PO) Take 1 tablet by mouth daily.      . Omega-3 Fatty Acids (FISH OIL) 1000 MG CAPS Take 1 capsule by mouth daily.      Marland Kitchen omeprazole (PRILOSEC) 40 MG capsule Take 1 capsule (40 mg total) by mouth daily. 30 capsule 3  . warfarin (COUMADIN) 5 MG tablet Take as directed by Anticoagulation clinic 45 tablet 3   No current facility-administered medications on file prior to visit.    Past Medical History  Diagnosis Date  . Clotting disorder   . Hyperlipidemia   . Colon polyps     2008 Tubular Adenoma  . Anxiety   . IBS (irritable bowel syndrome)   . Hypertension   . Irregular heart beat     Past Surgical History  Procedure Laterality Date  . Appendectomy      1967  . Tonsillectomy and adenoidectomy    . Colonoscopy    . Polypectomy      Social History  reports that he has quit smoking. His smoking use included Cigarettes. He has never used smokeless tobacco. He reports that he drinks alcohol. He reports that he does not use illicit drugs.  Family History family history includes Heart attack in his father;  Heart disease in his mother; Hypertension in his father and mother; Irritable bowel syndrome in his mother; Prostate cancer in his father.   Review of Systems  Constitutional: Negative.   Respiratory: Negative.   Cardiovascular: Negative.   Gastrointestinal: Negative.   Musculoskeletal: Negative.   Skin: Negative.   Neurological: Negative.   Hematological: Negative.   Psychiatric/Behavioral: Negative.   All other systems reviewed and are negative.   BP 118/88 mmHg  Pulse 61  Ht 6' (1.829 m)  Wt 247 lb 8 oz (112.265 kg)  BMI 33.56 kg/m2  Physical Exam  Constitutional: He is oriented to person, place, and time. He appears well-developed and well-nourished.  HENT:  Head:  Normocephalic.  Nose: Nose normal.  Mouth/Throat: Oropharynx is clear and moist.  Eyes: Conjunctivae are normal. Pupils are equal, round, and reactive to light.  Neck: Normal range of motion. Neck supple. No JVD present.  Cardiovascular: Normal rate, regular rhythm, S1 normal, S2 normal, normal heart sounds and intact distal pulses.  Exam reveals no gallop and no friction rub.   No murmur heard. Pulmonary/Chest: Effort normal and breath sounds normal. No respiratory distress. He has no wheezes. He has no rales. He exhibits no tenderness.  Abdominal: Soft. Bowel sounds are normal. He exhibits no distension. There is no tenderness.  Musculoskeletal: Normal range of motion. He exhibits no edema or tenderness.  Lymphadenopathy:    He has no cervical adenopathy.  Neurological: He is alert and oriented to person, place, and time. Coordination normal.  Skin: Skin is warm and dry. No rash noted. No erythema.  Psychiatric: He has a normal mood and affect. His behavior is normal. Judgment and thought content normal.      Assessment and Plan   Nursing note and vitals reviewed.

## 2014-09-09 NOTE — Patient Instructions (Addendum)
You are doing well.  Please start zetia one a day  Please call us if you have new issues that need to be addressed before your next appt.  Your physician wants you to follow-up in: 12 months.  You will receive a reminder letter in the mail two months in advance. If you don't receive a letter, please call our office to schedule the follow-up appointment.

## 2014-09-09 NOTE — Assessment & Plan Note (Signed)
Minimal edema on today's visit. Recommended he continue to wear compression hose Likely secondary to venous insufficiency

## 2014-09-09 NOTE — Assessment & Plan Note (Signed)
Tolerating warfarin He is not interested in one of the new agents, NOACs

## 2014-09-09 NOTE — Assessment & Plan Note (Signed)
Stressed the importance of aggressive cholesterol management, goal total cholesterol less than 150 He will stay on Lipitor 40 mg daily and will add zetia 10 mg daily Recommended weight loss

## 2014-09-09 NOTE — Assessment & Plan Note (Signed)
Blood pressure is well controlled on today's visit. No changes made to the medications. 

## 2014-09-24 ENCOUNTER — Ambulatory Visit (INDEPENDENT_AMBULATORY_CARE_PROVIDER_SITE_OTHER): Payer: Medicare Other

## 2014-09-24 DIAGNOSIS — I82409 Acute embolism and thrombosis of unspecified deep veins of unspecified lower extremity: Secondary | ICD-10-CM

## 2014-09-24 DIAGNOSIS — Z7901 Long term (current) use of anticoagulants: Secondary | ICD-10-CM

## 2014-09-24 DIAGNOSIS — Z5181 Encounter for therapeutic drug level monitoring: Secondary | ICD-10-CM

## 2014-09-24 LAB — POCT INR: INR: 2.8

## 2014-10-14 ENCOUNTER — Ambulatory Visit (INDEPENDENT_AMBULATORY_CARE_PROVIDER_SITE_OTHER): Payer: Medicare Other

## 2014-10-14 DIAGNOSIS — Z23 Encounter for immunization: Secondary | ICD-10-CM

## 2014-10-15 ENCOUNTER — Other Ambulatory Visit: Payer: Self-pay

## 2014-10-15 ENCOUNTER — Ambulatory Visit (INDEPENDENT_AMBULATORY_CARE_PROVIDER_SITE_OTHER): Payer: Medicare Other

## 2014-10-15 DIAGNOSIS — Z7901 Long term (current) use of anticoagulants: Secondary | ICD-10-CM | POA: Diagnosis not present

## 2014-10-15 DIAGNOSIS — I82409 Acute embolism and thrombosis of unspecified deep veins of unspecified lower extremity: Secondary | ICD-10-CM | POA: Diagnosis not present

## 2014-10-15 DIAGNOSIS — Z5181 Encounter for therapeutic drug level monitoring: Secondary | ICD-10-CM

## 2014-10-15 LAB — POCT INR: INR: 2.9

## 2014-10-15 MED ORDER — LOSARTAN POTASSIUM 100 MG PO TABS: 100.0000 mg | ORAL_TABLET | Freq: Every day | ORAL | Status: DC

## 2014-10-15 NOTE — Telephone Encounter (Signed)
Refill sent for Losartan potasssium 100 mg

## 2014-11-19 ENCOUNTER — Ambulatory Visit (INDEPENDENT_AMBULATORY_CARE_PROVIDER_SITE_OTHER): Payer: Medicare Other

## 2014-11-19 DIAGNOSIS — I82409 Acute embolism and thrombosis of unspecified deep veins of unspecified lower extremity: Secondary | ICD-10-CM

## 2014-11-19 DIAGNOSIS — Z7901 Long term (current) use of anticoagulants: Secondary | ICD-10-CM | POA: Diagnosis not present

## 2014-11-19 DIAGNOSIS — Z5181 Encounter for therapeutic drug level monitoring: Secondary | ICD-10-CM

## 2014-11-19 LAB — POCT INR: INR: 3.1

## 2014-12-17 ENCOUNTER — Ambulatory Visit (INDEPENDENT_AMBULATORY_CARE_PROVIDER_SITE_OTHER): Payer: Medicare Other | Admitting: *Deleted

## 2014-12-17 DIAGNOSIS — I82409 Acute embolism and thrombosis of unspecified deep veins of unspecified lower extremity: Secondary | ICD-10-CM

## 2014-12-17 DIAGNOSIS — Z7901 Long term (current) use of anticoagulants: Secondary | ICD-10-CM | POA: Diagnosis not present

## 2014-12-17 DIAGNOSIS — Z5181 Encounter for therapeutic drug level monitoring: Secondary | ICD-10-CM

## 2014-12-17 LAB — POCT INR: INR: 2.6

## 2014-12-29 ENCOUNTER — Other Ambulatory Visit: Payer: Self-pay | Admitting: Family Medicine

## 2014-12-29 ENCOUNTER — Other Ambulatory Visit: Payer: Self-pay | Admitting: Cardiovascular Disease

## 2015-01-21 ENCOUNTER — Ambulatory Visit (INDEPENDENT_AMBULATORY_CARE_PROVIDER_SITE_OTHER): Payer: Medicare Other

## 2015-01-21 DIAGNOSIS — Z5181 Encounter for therapeutic drug level monitoring: Secondary | ICD-10-CM

## 2015-01-21 DIAGNOSIS — I82409 Acute embolism and thrombosis of unspecified deep veins of unspecified lower extremity: Secondary | ICD-10-CM

## 2015-01-21 DIAGNOSIS — Z7901 Long term (current) use of anticoagulants: Secondary | ICD-10-CM

## 2015-01-21 LAB — POCT INR: INR: 2.3

## 2015-01-26 ENCOUNTER — Other Ambulatory Visit: Payer: Self-pay | Admitting: Family Medicine

## 2015-01-26 ENCOUNTER — Other Ambulatory Visit: Payer: Self-pay | Admitting: Cardiovascular Disease

## 2015-01-26 NOTE — Telephone Encounter (Signed)
Please review for refill, Thank you. 

## 2015-01-26 NOTE — Telephone Encounter (Signed)
Received refill request electronically Last office visit 07/03/14 Last refill 05/19/14 #90/3 Is it okay to refill?

## 2015-01-27 NOTE — Telephone Encounter (Signed)
Rx called to pharmacy

## 2015-02-23 ENCOUNTER — Telehealth: Payer: Self-pay | Admitting: Family Medicine

## 2015-02-23 ENCOUNTER — Other Ambulatory Visit: Payer: Self-pay | Admitting: Family Medicine

## 2015-02-23 NOTE — Telephone Encounter (Signed)
Pt has appt with Dr Deborra Medina on 02/24/15 at 10:45 AM.

## 2015-02-23 NOTE — Telephone Encounter (Signed)
PLEASE NOTE: All timestamps contained within this report are represented as [place] Standard Time. CONFIDENTIALTY NOTICE: This fax transmission is intended only for the addressee. It contains information that is legally privileged, confidential or otherwise protected from use or disclosure. If you are not the intended recipient, you are strictly prohibited from reviewing, disclosing, copying using or disseminating any of this information or taking any action in reliance on or regarding this information. If you have received this fax in error, please notify us immediately by telephone so that we can arrange for its return to [place]. Phone: [phone], Toll-Free: [phone], Fax: [phone] Page: 1 of 1 Call Id: [id number] [place] Patient Name: Anthony Kim DOB: 21-Nov-1945 Initial Comment Caller states he is having dizzy spells and when he gets up he leans to the right. He isn't sure if it's inner ear. Nurse Assessment Nurse: Marcelline Deist, RN, Lynda Date/Time (Eastern Time): 02/23/2015 12:58:16 PM Confirm and document reason for call. If symptomatic, describe symptoms. You must click the next button to save text entered. ---Caller states he is having dizzy spells and when he gets up he leans to the right. He isn't sure if it's inner ear. Symptoms since this weekend. No sinus or upper respiratory symptoms. His balance seems off. Has the patient traveled out of the country within the last 30 days? ---Not Applicable Does the patient have any new or worsening symptoms? ---Yes Will a triage be completed? ---Yes Related visit to physician within the last 2 weeks? ---No Does the PT have any chronic conditions? (i.e. diabetes, asthma, etc.) ---Yes List chronic conditions. ---irreg. heart beat, blood clot, on Coumadin Is this a behavioral health or substance abuse call? ---No Guidelines Guideline Title  Affirmed Question Affirmed Notes Dizziness - Lightheadedness [1] MODERATE dizziness (e.g., interferes with normal activities) AND [2] has NOT been evaluated by physician for this (Exception: dizziness caused by heat exposure, sudden standing, or poor fluid intake) Final Disposition User See Physician within [employer], RN, Kermit Balo Comments Caller states the episodes last about 30-45 minutes before there is improvement. he has been doing some work involving working above his head lately. Referrals REFERRED TO PCP OFFICE Disagree/Comply: Comply

## 2015-02-24 ENCOUNTER — Encounter: Payer: Self-pay | Admitting: Family Medicine

## 2015-02-24 ENCOUNTER — Ambulatory Visit (INDEPENDENT_AMBULATORY_CARE_PROVIDER_SITE_OTHER): Payer: Medicare Other | Admitting: Family Medicine

## 2015-02-24 VITALS — BP 122/72 | HR 64 | Temp 97.8°F | Wt 247.5 lb

## 2015-02-24 DIAGNOSIS — H8112 Benign paroxysmal vertigo, left ear: Secondary | ICD-10-CM | POA: Diagnosis not present

## 2015-02-24 DIAGNOSIS — H8110 Benign paroxysmal vertigo, unspecified ear: Secondary | ICD-10-CM | POA: Insufficient documentation

## 2015-02-24 DIAGNOSIS — H6122 Impacted cerumen, left ear: Secondary | ICD-10-CM | POA: Diagnosis not present

## 2015-02-24 DIAGNOSIS — H6120 Impacted cerumen, unspecified ear: Secondary | ICD-10-CM | POA: Insufficient documentation

## 2015-02-24 MED ORDER — MECLIZINE HCL 25 MG PO TABS: 25.0000 mg | ORAL_TABLET | Freq: Three times a day (TID) | ORAL | Status: DC | PRN

## 2015-02-24 NOTE — Assessment & Plan Note (Signed)
Ceruminosis is noted.  Wax is removed by syringing and manual debridement. Instructions for home care to prevent wax buildup are given.  

## 2015-02-24 NOTE — Progress Notes (Signed)
Subjective:   Patient ID: Anthony Conch., male    DOB: 03-02-1945, 70 y.o.   MRN: [id number]  Anthony Agredano. is a pleasant 70 y.o. 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  Symptoms are worse when he first wakes up in the morning- has to hold the wall to walk or he feels like he may fall.  No associated nausea or vomiting. Sitting still does help.  Dramamine did not help much.  No HA.  Has never had anything like this before.  No ear pain.  Current Outpatient Prescriptions on File Prior to Visit  Medication Sig Dispense Refill  . acetaminophen (TYLENOL) 325 MG tablet Take 325 mg by mouth every 6 (six) hours as needed for pain.     Marland Kitchen ALPRAZOLAM XR 0.5 MG 24 hr tablet TAKE 1 TABLET DAILY. 90 tablet 0  . atorvastatin (LIPITOR) 40 MG tablet Take 1 tablet (40 mg total) by mouth daily. 30 tablet 3  . buPROPion (WELLBUTRIN XL) 150 MG 24 hr tablet Take 1 tablet (150 mg total) by mouth daily. 30 tablet 3  . ezetimibe (ZETIA) 10 MG tablet Take 1 tablet (10 mg total) by mouth daily. 30 tablet 11  . fenofibrate (TRICOR) 145 MG tablet Take 1 tablet (145 mg total) by mouth daily. 30 tablet 5  . fexofenadine (ALLEGRA) 180 MG tablet Take 180 mg by mouth daily.    . fluticasone (FLONASE) 50 MCG/ACT nasal spray Place 2 sprays into the nose daily. (Patient taking differently: Place 2 sprays into the nose as needed. ) 16 g 6  . losartan (COZAAR) 100 MG tablet Take 1 tablet (100 mg total) by mouth daily. 30 tablet 6  . Multiple Vitamin (MULTIVITAMIN PO) Take 1 tablet by mouth daily.      . Omega-3 Fatty Acids (FISH OIL) 1000 MG CAPS Take 1 capsule by mouth daily.      Marland Kitchen omeprazole (PRILOSEC) 40 MG capsule Take 1 capsule (40 mg total) by mouth daily. 30 capsule 6  . warfarin (COUMADIN) 5 MG tablet Take as directed by Anticoagulation clinic 45 tablet 3   No  current facility-administered medications on file prior to visit.    Allergies  Allergen Reactions  . Iodine Hives    Past Medical History  Diagnosis Date  . Clotting disorder ([place])   . Hyperlipidemia   . Colon polyps     2008 Tubular Adenoma  . Anxiety   . IBS (irritable bowel syndrome)   . Hypertension   . Irregular heart beat     Past Surgical History  Procedure Laterality Date  . Appendectomy      1967  . Tonsillectomy and adenoidectomy    . Colonoscopy    . Polypectomy      Family History  Problem Relation Age of Onset  . Heart disease Mother   . Hypertension Mother   . Heart attack Father   . Hypertension Father   . Prostate cancer Father   . Irritable bowel syndrome Mother     Social History   Social History  . Marital Status: Married    Spouse Name: N/A  . Number of Children: 1  . Years of Education: N/A   Occupational History  . Sales    Social History Main Topics  . Smoking status: Former Smoker  Types: Cigarettes  . Smokeless tobacco: Never Used  . Alcohol Use: Yes     Comment: occ  . Drug Use: No  . Sexual Activity: Not on file   Other Topics Concern  . Not on file   Social History Narrative   Has living will.   Desires CPR.   Would not want life support if recovery futile.  Unsure about feeding tube.   The PMH, PSH, Social History, Family History, Medications, and allergies have been reviewed in [facility], and have been updated if relevant.  Review of Systems  Constitutional: Negative.   HENT: Negative.   Eyes: Negative for visual disturbance.  Respiratory: Negative.   Cardiovascular: Negative.   Musculoskeletal: Negative.   Neurological: Positive for dizziness. Negative for tremors, seizures, syncope, facial asymmetry, speech difficulty, weakness, light-headedness, numbness and headaches.  All other systems reviewed and are negative.      Objective:    BP 122/72 mmHg  Pulse 64  Temp(Src) 97.8 F (36.6 C) (Oral)  Wt 247  lb 8 oz (112.265 kg)  SpO2 96%   Physical Exam  Constitutional: He is oriented to person, place, and time. He appears well-developed and well-nourished. No distress.  Cerumen impaction left +dix hallpike left  Eyes: EOM are normal.  Cardiovascular: Normal rate and regular rhythm.   Pulmonary/Chest: Effort normal and breath sounds normal.  Musculoskeletal: Normal range of motion. He exhibits no edema.  Neurological: He is alert and oriented to person, place, and time. No cranial nerve deficit.  Skin: Skin is warm and dry. He is not diaphoretic.  Psychiatric: He has a normal mood and affect. His behavior is normal. Judgment and thought content normal.  Nursing note and vitals reviewed.         Assessment & Plan:   Benign paroxysmal positional vertigo, left  Cerumen impaction, left No Follow-up on file.

## 2015-02-24 NOTE — Progress Notes (Signed)
Pre visit review using our clinic review tool, if applicable. No additional management support is needed unless otherwise documented below in the visit note. 

## 2015-02-24 NOTE — Assessment & Plan Note (Signed)
New- exam consistent with BPV. Possibly secondary to unilateral cerumen impaction. Ear lavage as documented below. Meclizine  Call or return to clinic prn if these symptoms worsen or fail to improve as anticipated. The patient indicates understanding of these issues and agrees with the plan.

## 2015-02-24 NOTE — Patient Instructions (Addendum)
Good to see you.  Use dilute hydrogen peroxide (1:1 with water) a few drops in right ear to help break down ear wax.  Use meclizine as directed.  Please call me if you are not feeling better within 48 hours or if symptoms worsen.  Vertigo Vertigo means that you feel like you are moving when you are not. Vertigo can also make you feel like things around you are moving when they are not. This feeling can come and go at any time. Vertigo often goes away on its own. HOME CARE  Avoid making fast movements.  Avoid driving.  Avoid using heavy machinery.  Avoid doing any task or activity that might cause danger to you or other people if you would have a vertigo attack while you are doing it.  Sit down right away if you feel dizzy or have trouble with your balance.  Take over-the-counter and prescription medicines only as told by your doctor.  Follow instructions from your doctor about which positions or movements you should avoid.  Drink enough fluid to keep your pee (urine) clear or pale yellow.  Keep all follow-up visits as told by your doctor. This is important. GET HELP IF:  Medicine does not help your vertigo.  You have a fever.  Your problems get worse or you have new symptoms.  Your family or friends see changes in your behavior.  You feel sick to your stomach (nauseous) or you throw up (vomit).  You have a "pins and needles" feeling or you are numb in part of your body. GET HELP RIGHT AWAY IF:  You have trouble moving or talking.  You are always dizzy.  You pass out (faint).  You get very bad headaches.  You feel weak or have trouble using your hands, arms, or legs.  You have changes in your hearing.  You have changes in your seeing (vision).  You get a stiff neck.  Bright light starts to bother you.   This information is not intended to replace advice given to you by your health care provider. Make sure you discuss any questions you have with your health  care provider.   Document Released: 10/13/2007 Document Revised: 09/24/2014 Document Reviewed: 04/28/2014 Elsevier Interactive Patient Education [employer].

## 2015-03-04 ENCOUNTER — Ambulatory Visit (INDEPENDENT_AMBULATORY_CARE_PROVIDER_SITE_OTHER): Payer: Medicare Other

## 2015-03-04 DIAGNOSIS — Z7901 Long term (current) use of anticoagulants: Secondary | ICD-10-CM

## 2015-03-04 DIAGNOSIS — I82409 Acute embolism and thrombosis of unspecified deep veins of unspecified lower extremity: Secondary | ICD-10-CM | POA: Diagnosis not present

## 2015-03-04 DIAGNOSIS — Z5181 Encounter for therapeutic drug level monitoring: Secondary | ICD-10-CM | POA: Diagnosis not present

## 2015-03-04 LAB — POCT INR: INR: 3

## 2015-04-06 DIAGNOSIS — N400 Enlarged prostate without lower urinary tract symptoms: Secondary | ICD-10-CM | POA: Diagnosis not present

## 2015-04-06 DIAGNOSIS — Z0000 Encounter for general adult medical examination without abnormal findings: Secondary | ICD-10-CM | POA: Diagnosis not present

## 2015-04-06 DIAGNOSIS — N5201 Erectile dysfunction due to arterial insufficiency: Secondary | ICD-10-CM | POA: Diagnosis not present

## 2015-04-15 ENCOUNTER — Ambulatory Visit (INDEPENDENT_AMBULATORY_CARE_PROVIDER_SITE_OTHER): Payer: Medicare Other

## 2015-04-15 DIAGNOSIS — I82409 Acute embolism and thrombosis of unspecified deep veins of unspecified lower extremity: Secondary | ICD-10-CM

## 2015-04-15 DIAGNOSIS — Z5181 Encounter for therapeutic drug level monitoring: Secondary | ICD-10-CM

## 2015-04-15 DIAGNOSIS — Z7901 Long term (current) use of anticoagulants: Secondary | ICD-10-CM

## 2015-04-15 LAB — POCT INR: INR: 3.3

## 2015-04-20 ENCOUNTER — Other Ambulatory Visit: Payer: Self-pay | Admitting: Family Medicine

## 2015-04-20 ENCOUNTER — Other Ambulatory Visit: Payer: Self-pay | Admitting: Cardiovascular Disease

## 2015-04-20 NOTE — Telephone Encounter (Signed)
Last f/u 06/2014 

## 2015-04-21 NOTE — Telephone Encounter (Signed)
Rx called in to requested pharmacy 

## 2015-05-13 ENCOUNTER — Ambulatory Visit (INDEPENDENT_AMBULATORY_CARE_PROVIDER_SITE_OTHER): Payer: Medicare Other

## 2015-05-13 DIAGNOSIS — Z7901 Long term (current) use of anticoagulants: Secondary | ICD-10-CM

## 2015-05-13 DIAGNOSIS — Z5181 Encounter for therapeutic drug level monitoring: Secondary | ICD-10-CM

## 2015-05-13 DIAGNOSIS — I82409 Acute embolism and thrombosis of unspecified deep veins of unspecified lower extremity: Secondary | ICD-10-CM | POA: Diagnosis not present

## 2015-05-13 LAB — POCT INR: INR: 1.8

## 2015-06-03 ENCOUNTER — Ambulatory Visit (INDEPENDENT_AMBULATORY_CARE_PROVIDER_SITE_OTHER): Payer: Medicare Other

## 2015-06-03 DIAGNOSIS — Z7901 Long term (current) use of anticoagulants: Secondary | ICD-10-CM

## 2015-06-03 DIAGNOSIS — I82409 Acute embolism and thrombosis of unspecified deep veins of unspecified lower extremity: Secondary | ICD-10-CM | POA: Diagnosis not present

## 2015-06-03 DIAGNOSIS — Z5181 Encounter for therapeutic drug level monitoring: Secondary | ICD-10-CM | POA: Diagnosis not present

## 2015-06-03 LAB — POCT INR: INR: 1.8

## 2015-06-09 DIAGNOSIS — H2513 Age-related nuclear cataract, bilateral: Secondary | ICD-10-CM | POA: Diagnosis not present

## 2015-06-12 ENCOUNTER — Other Ambulatory Visit: Payer: Self-pay | Admitting: Family Medicine

## 2015-06-16 NOTE — Telephone Encounter (Signed)
This pt has not had a lipid panel since 12/2014

## 2015-06-17 ENCOUNTER — Encounter: Payer: Self-pay | Admitting: Family Medicine

## 2015-06-17 ENCOUNTER — Ambulatory Visit (INDEPENDENT_AMBULATORY_CARE_PROVIDER_SITE_OTHER)
Admission: RE | Admit: 2015-06-17 | Discharge: 2015-06-17 | Disposition: A | Discharge status: Home / Self Care | Payer: Medicare Other | Source: Ambulatory Visit | Attending: Family Medicine | Admitting: Family Medicine

## 2015-06-17 ENCOUNTER — Ambulatory Visit (INDEPENDENT_AMBULATORY_CARE_PROVIDER_SITE_OTHER): Payer: Medicare Other | Admitting: Family Medicine

## 2015-06-17 VITALS — BP 100/64 | HR 73 | Temp 97.7°F | Ht 72.0 in | Wt 245.5 lb

## 2015-06-17 DIAGNOSIS — R05 Cough: Secondary | ICD-10-CM | POA: Diagnosis not present

## 2015-06-17 DIAGNOSIS — Z720 Tobacco use: Secondary | ICD-10-CM

## 2015-06-17 DIAGNOSIS — Z87891 Personal history of nicotine dependence: Secondary | ICD-10-CM

## 2015-06-17 DIAGNOSIS — R918 Other nonspecific abnormal finding of lung field: Secondary | ICD-10-CM

## 2015-06-17 DIAGNOSIS — R059 Cough, unspecified: Secondary | ICD-10-CM

## 2015-06-17 DIAGNOSIS — J189 Pneumonia, unspecified organism: Secondary | ICD-10-CM | POA: Diagnosis not present

## 2015-06-17 DIAGNOSIS — R0602 Shortness of breath: Secondary | ICD-10-CM | POA: Diagnosis not present

## 2015-06-17 MED ORDER — CEFDINIR 300 MG PO CAPS: 600.0000 mg | ORAL_CAPSULE | Freq: Every day | ORAL | Status: DC

## 2015-06-17 MED ORDER — PREDNISONE 20 MG PO TABS: ORAL_TABLET | ORAL | Status: DC

## 2015-06-17 NOTE — Progress Notes (Signed)
Dr. Frederico Hamman T. Josep Luviano, MD, [facility] [place], [address] Phone: [phone] Fax: [phone]  06/17/2015  Patient: Anthony Kim., MRN: [id number], DOB: 16-Apr-1945, 70 y.o.  Primary Physician:  Arnette Norris, MD   Chief Complaint  Patient presents with  . Cough    light greenish phelgm-started Saturday  . Shortness of Breath   Subjective:   Anthony Kim. is a 70 y.o. very pleasant male patient who presents with the following:  Having some trouble breathig and head is stopped up. Cleaned out the attic last week and also got pretty hot. No fever. Exposed to a lot of dust recently.   He is having quite a bit of difficulty breathing over the last week.  He is coughing somewhat and productive of some relatively small amount of yellowish sputum.  He is afebrile currently.  He does have a history of smoking for 30 years, but he does not have a history of diagnosed COPD.  He is on Coumadin for atrial fibrillation, but he does not have a history of congestive heart failure and has no history of coronary disease. He had a normal nuclear stress test in August 2014 that was reviewed.  Recent cardiology notes were reviewed.  Smoked about 30 years.  Having some trouble breathing - back is sore and sides.   Cc: erica coumadin  Past Medical History, Surgical History, Social History, Family History, Problem List, Medications, and Allergies have been reviewed and updated if relevant.  Patient Active Problem List   Diagnosis Date Noted  . Benign paroxysmal positional vertigo 02/24/2015  . Cerumen impaction 02/24/2015  . Obesity 09/09/2014  . Medicare annual wellness visit, subsequent 06/23/2014  . Right knee pain 06/23/2014  . Decreased GFR 06/23/2014  . Encounter for therapeutic drug monitoring 03/20/2013  . Sleep apnea 09/05/2012  . Allergic rhinitis 03/22/2012  . Anxiety 02/04/2011  . Deep vein thrombosis (DVT)  ([place]) 04/06/2010  . Long term current use of anticoagulant 04/06/2010  . History of DVT of lower extremity 07/18/2008  . Edema 06/26/2008  . SKIN CANCER, HX OF 10/30/2007  . ERECTILE DYSFUNCTION 04/30/2007  . COLONIC POLYPS 2/08 07/12/2006  . ARRHYTHMIA, HX OF 6/98 07/12/2006  . HYPERCHOLESTEROLEMIA 04/20/2006  . DEPRESSION 04/20/2006  . Essential hypertension 04/20/2006    Past Medical History  Diagnosis Date  . Clotting disorder ([place])   . Hyperlipidemia   . Colon polyps     2008 Tubular Adenoma  . Anxiety   . IBS (irritable bowel syndrome)   . Hypertension   . Irregular heart beat     Past Surgical History  Procedure Laterality Date  . Appendectomy      1967  . Tonsillectomy and adenoidectomy    . Colonoscopy    . Polypectomy      Social History   Social History  . Marital Status: Married    Spouse Name: N/A  . Number of Children: 1  . Years of Education: N/A   Occupational History  . Sales    Social History Main Topics  . Smoking status: Former Smoker    Types: Cigarettes  . Smokeless tobacco: Never Used  . Alcohol Use: Yes     Comment: occ  . Drug Use: No  . Sexual Activity: Not on file   Other Topics Concern  . Not on file   Social History Narrative   Has living will.   Desires CPR.  Would not want life support if recovery futile.  Unsure about feeding tube.    Family History  Problem Relation Age of Onset  . Heart disease Mother   . Hypertension Mother   . Heart attack Father   . Hypertension Father   . Prostate cancer Father   . Irritable bowel syndrome Mother     Allergies  Allergen Reactions  . Iodine Hives    Medication list reviewed and updated in full in [place].  ROS: GEN: Acute illness details above GI: Tolerating PO intake GU: maintaining adequate hydration and urination Pulm: + SOB and coughing Interactive and getting along well at home.  Otherwise, ROS is as per the HPI.  Objective:   BP 100/64 mmHg   Pulse 73  Temp(Src) 97.7 F (36.5 C) (Oral)  Ht 6' (1.829 m)  Wt 245 lb 8 oz (111.358 kg)  BMI 33.29 kg/m2  SpO2 91%   GEN: A and O x 3. WDWN. NAD.    ENT: Nose clear, ext NML.  No LAD.  No JVD.  TM's clear. Oropharynx clear.  PULM: Normal WOB, no distress. Crackles throughout most of the lung fields particularly in the middle portion of the lung to the bases.  There are rhonchi throughout mildly, but no significant wheezing.. CV: RRR, no M/G/R, No rubs, No JVD.   EXT: warm and well-perfused, No c/c/e. PSYCH: Pleasant and conversant.    Laboratory and Imaging Data: Results for orders placed or performed in visit on 06/03/15  POCT INR  Result Value Ref Range   INR 1.8    Dg Chest 2 View  06/17/2015  CLINICAL DATA:  Cough and shortness of breath and abnormal chest exam ; coronary artery disease. EXAM: CHEST  2 VIEW COMPARISON:  Chest x-ray of August 15, 2012 and chest CT scan of the same date FINDINGS: The lungs are well-expanded. There is no focal infiltrate. There is no pleural effusion. The heart and pulmonary vascularity are normal. The mediastinum is normal in width. The bony thorax exhibits no acute abnormality. There are degenerative changes of the AC joints bilaterally. IMPRESSION: There is no active cardiopulmonary disease. Electronically Signed   By: David  Martinique M.D.   On: 06/17/2015 16:37    The radiological images were independently reviewed by myself in the office and results were reviewed with the patient. My independent interpretation of images:  On my review, I have concerns for potential right middle lobe pneumonia. To a lesser extent, there is also some increased opacity near the left hilum.  Clinically, I think this still reflects possible pneumonia. Electronically Signed  By: Owens Loffler, MD On: 06/18/2015 9:15 AM   Assessment and Plan:   CAP (community acquired pneumonia)  Cough - Plan: DG Chest 2 View  Lung field abnormal finding on examination - Plan: DG  Chest 2 View  History of smoking - Plan: DG Chest 2 View  Clinical picture, examination, and radiographical evaluation would point to pneumonia as the highest likelihood of diagnosis.  A pneumonitis or COPD exacerbation and a former smoker after inhalation of significant dust is also possible.  Notably his pulse ox is 91%.  He appears stable for outpatient management.  I'm going to place him on Omnicef, which should not interfere with his Coumadin, and placed him on prednisone. I will cc: Ms. Ovidio Kin also.   Follow-up: if needed  New Prescriptions   CEFDINIR (OMNICEF) 300 MG CAPSULE    Take 2 capsules (600 mg total) by  mouth daily.   PREDNISONE (DELTASONE) 20 MG TABLET    2 tabs po for 4 days, then 1 tab po for 4 days   Orders Placed This Encounter  Procedures  . DG Chest 2 View    Signed,  Frederico Hamman T. Kimiye Strathman, MD   Patient's Medications  New Prescriptions   CEFDINIR (OMNICEF) 300 MG CAPSULE    Take 2 capsules (600 mg total) by mouth daily.   PREDNISONE (DELTASONE) 20 MG TABLET    2 tabs po for 4 days, then 1 tab po for 4 days  Previous Medications   ACETAMINOPHEN (TYLENOL) 325 MG TABLET    Take 325 mg by mouth every 6 (six) hours as needed for pain.    ALPRAZOLAM XR 0.5 MG 24 HR TABLET    TAKE 1 TABLET DAILY.   ATORVASTATIN (LIPITOR) 40 MG TABLET    Take 1 tablet (40 mg total) by mouth daily.   BUPROPION (WELLBUTRIN XL) 150 MG 24 HR TABLET    Take 1 tablet (150 mg total) by mouth daily.   FENOFIBRATE (TRICOR) 145 MG TABLET    Take 1 tablet (145 mg total) by mouth daily.   FEXOFENADINE (ALLEGRA) 180 MG TABLET    Take 180 mg by mouth daily.   LOSARTAN (COZAAR) 100 MG TABLET    Take 1 tablet (100 mg total) by mouth daily.   MULTIPLE VITAMIN (MULTIVITAMIN PO)    Take 1 tablet by mouth daily.     OMEGA-3 FATTY ACIDS (FISH OIL) 1000 MG CAPS    Take 1 capsule by mouth daily.     OMEPRAZOLE (PRILOSEC) 40 MG CAPSULE    Take 1 capsule (40 mg total) by mouth daily.   WARFARIN  (COUMADIN) 5 MG TABLET    Take as directed by Anticoagulation clinic  Modified Medications   No medications on file  Discontinued Medications   EZETIMIBE (ZETIA) 10 MG TABLET    Take 1 tablet (10 mg total) by mouth daily.   FLUTICASONE (FLONASE) 50 MCG/ACT NASAL SPRAY    Place 2 sprays into the nose daily.   MECLIZINE (ANTIVERT) 25 MG TABLET    Take 1 tablet (25 mg total) by mouth 3 (three) times daily as needed for dizziness.

## 2015-06-17 NOTE — Progress Notes (Signed)
Pre visit review using our clinic review tool, if applicable. No additional management support is needed unless otherwise documented below in the visit note. 

## 2015-06-18 ENCOUNTER — Telehealth: Payer: Self-pay | Admitting: *Deleted

## 2015-06-18 ENCOUNTER — Other Ambulatory Visit: Payer: Self-pay | Admitting: *Deleted

## 2015-06-18 MED ORDER — ALPRAZOLAM ER 0.5 MG PO TB24: 0.5000 mg | ORAL_TABLET | Freq: Every day | ORAL | Status: DC

## 2015-06-18 MED ORDER — GUAIFENESIN-CODEINE 100-10 MG/5ML PO SOLN: 5.0000 mL | Freq: Three times a day (TID) | ORAL | Status: DC | PRN

## 2015-06-18 NOTE — Telephone Encounter (Signed)
Robitussin AC, 1 tsp po q 8 hours prn cough. #180 mL, 0 ref.   Please tell him that this has codeine, he cannot drive if he takes it. I would only take it before bed or if he is going to take a nap.

## 2015-06-18 NOTE — Telephone Encounter (Signed)
X-ray results discussed with [name].  He is asking for something to help with the cough.  He states his sides are hurting from coughing so much.  He has been taking Robitussin which he states helps some but takes a while to work. Please advise.

## 2015-06-18 NOTE — Telephone Encounter (Signed)
Last f/u 06/2014 

## 2015-06-18 NOTE — Telephone Encounter (Signed)
Robitussin AC called into [facility].  Mr. Baumel notified as instructed by telephone.

## 2015-06-19 MED ORDER — ALPRAZOLAM ER 0.5 MG PO TB24: 0.5000 mg | ORAL_TABLET | Freq: Every day | ORAL | Status: DC

## 2015-06-19 NOTE — Telephone Encounter (Signed)
Rx called in to requested pharmacy 

## 2015-06-19 NOTE — Addendum Note (Signed)
Addended by: Modena Nunnery on: 06/19/2015 09:58 AM   Modules accepted: Orders

## 2015-06-22 ENCOUNTER — Ambulatory Visit (INDEPENDENT_AMBULATORY_CARE_PROVIDER_SITE_OTHER): Payer: Medicare Other | Admitting: *Deleted

## 2015-06-22 DIAGNOSIS — Z7901 Long term (current) use of anticoagulants: Secondary | ICD-10-CM

## 2015-06-22 DIAGNOSIS — Z5181 Encounter for therapeutic drug level monitoring: Secondary | ICD-10-CM

## 2015-06-22 DIAGNOSIS — I82409 Acute embolism and thrombosis of unspecified deep veins of unspecified lower extremity: Secondary | ICD-10-CM

## 2015-06-22 LAB — POCT INR: INR: 3.1

## 2015-06-29 ENCOUNTER — Other Ambulatory Visit: Payer: Self-pay | Admitting: Cardiovascular Disease

## 2015-06-29 NOTE — Telephone Encounter (Signed)
Please review for refill. Thanks!  

## 2015-06-30 DIAGNOSIS — H2513 Age-related nuclear cataract, bilateral: Secondary | ICD-10-CM | POA: Diagnosis not present

## 2015-07-01 ENCOUNTER — Ambulatory Visit (INDEPENDENT_AMBULATORY_CARE_PROVIDER_SITE_OTHER): Payer: Medicare Other

## 2015-07-01 DIAGNOSIS — Z5181 Encounter for therapeutic drug level monitoring: Secondary | ICD-10-CM | POA: Diagnosis not present

## 2015-07-01 DIAGNOSIS — I82409 Acute embolism and thrombosis of unspecified deep veins of unspecified lower extremity: Secondary | ICD-10-CM | POA: Diagnosis not present

## 2015-07-01 DIAGNOSIS — Z7901 Long term (current) use of anticoagulants: Secondary | ICD-10-CM | POA: Diagnosis not present

## 2015-07-01 LAB — POCT INR: INR: 3.1

## 2015-07-06 NOTE — Discharge Instructions (Signed)

## 2015-07-08 ENCOUNTER — Ambulatory Visit: Payer: Medicare Other | Admitting: Anesthesiology

## 2015-07-08 ENCOUNTER — Ambulatory Visit
Admission: RE | Admit: 2015-07-08 | Discharge: 2015-07-08 | Disposition: A | Discharge status: Home / Self Care | Payer: Medicare Other | Source: Ambulatory Visit | Attending: Ophthalmology | Admitting: Ophthalmology

## 2015-07-08 ENCOUNTER — Encounter
Admission: RE | Disposition: A | Discharge status: Home / Self Care | Payer: Self-pay | Source: Ambulatory Visit | Attending: Ophthalmology

## 2015-07-08 DIAGNOSIS — I10 Essential (primary) hypertension: Secondary | ICD-10-CM | POA: Insufficient documentation

## 2015-07-08 DIAGNOSIS — Z86718 Personal history of other venous thrombosis and embolism: Secondary | ICD-10-CM | POA: Insufficient documentation

## 2015-07-08 DIAGNOSIS — F419 Anxiety disorder, unspecified: Secondary | ICD-10-CM | POA: Insufficient documentation

## 2015-07-08 DIAGNOSIS — K219 Gastro-esophageal reflux disease without esophagitis: Secondary | ICD-10-CM | POA: Diagnosis not present

## 2015-07-08 DIAGNOSIS — H2511 Age-related nuclear cataract, right eye: Secondary | ICD-10-CM | POA: Diagnosis not present

## 2015-07-08 DIAGNOSIS — Z87891 Personal history of nicotine dependence: Secondary | ICD-10-CM | POA: Insufficient documentation

## 2015-07-08 DIAGNOSIS — M1990 Unspecified osteoarthritis, unspecified site: Secondary | ICD-10-CM | POA: Diagnosis not present

## 2015-07-08 DIAGNOSIS — F329 Major depressive disorder, single episode, unspecified: Secondary | ICD-10-CM | POA: Insufficient documentation

## 2015-07-08 DIAGNOSIS — G4730 Sleep apnea, unspecified: Secondary | ICD-10-CM | POA: Insufficient documentation

## 2015-07-08 DIAGNOSIS — H2513 Age-related nuclear cataract, bilateral: Secondary | ICD-10-CM | POA: Diagnosis not present

## 2015-07-08 HISTORY — DX: Palpitations: R00.2

## 2015-07-08 HISTORY — DX: Presence of dental prosthetic device (complete) (partial): Z97.2

## 2015-07-08 HISTORY — DX: Acute embolism and thrombosis of unspecified deep veins of unspecified lower extremity: I82.409

## 2015-07-08 HISTORY — DX: Cough: R05

## 2015-07-08 HISTORY — DX: Pneumonia, unspecified organism: J18.9

## 2015-07-08 HISTORY — DX: Depression, unspecified: F32.A

## 2015-07-08 HISTORY — DX: Other seasonal allergic rhinitis: J30.2

## 2015-07-08 HISTORY — PX: CATARACT EXTRACTION W/PHACO: SHX586

## 2015-07-08 HISTORY — DX: Cough, unspecified: R05.9

## 2015-07-08 HISTORY — DX: Gastro-esophageal reflux disease without esophagitis: K21.9

## 2015-07-08 HISTORY — DX: Major depressive disorder, single episode, unspecified: F32.9

## 2015-07-08 SURGERY — PHACOEMULSIFICATION, CATARACT, WITH IOL INSERTION
Anesthesia: Monitor Anesthesia Care | Site: Eye | Laterality: Right | Wound class: Clean

## 2015-07-08 MED ORDER — ARMC OPHTHALMIC DILATING GEL
1.0000 "application " | OPHTHALMIC | Status: DC | PRN
  Administered 2015-07-08 (×2): 1 via OPHTHALMIC

## 2015-07-08 MED ORDER — BALANCED SALT IO SOLN
INTRAOCULAR | Status: DC | PRN
  Administered 2015-07-08: 1 mL via OPHTHALMIC

## 2015-07-08 MED ORDER — TIMOLOL MALEATE 0.5 % OP SOLN
OPHTHALMIC | Status: DC | PRN
  Administered 2015-07-08: 1 [drp] via OPHTHALMIC

## 2015-07-08 MED ORDER — NA HYALUR & NA CHOND-NA HYALUR 0.4-0.35 ML IO KIT
PACK | INTRAOCULAR | Status: DC | PRN
  Administered 2015-07-08: 1 mL via INTRAOCULAR

## 2015-07-08 MED ORDER — CEFUROXIME OPHTHALMIC INJECTION 1 MG/0.1 ML
INJECTION | OPHTHALMIC | Status: DC | PRN
  Administered 2015-07-08: 0.1 mL via INTRACAMERAL

## 2015-07-08 MED ORDER — TETRACAINE HCL 0.5 % OP SOLN
1.0000 [drp] | OPHTHALMIC | Status: DC | PRN
  Administered 2015-07-08: 1 [drp] via OPHTHALMIC

## 2015-07-08 MED ORDER — FENTANYL CITRATE (PF) 100 MCG/2ML IJ SOLN
INTRAMUSCULAR | Status: DC | PRN
  Administered 2015-07-08: 100 ug via INTRAVENOUS

## 2015-07-08 MED ORDER — EPINEPHRINE HCL 1 MG/ML IJ SOLN
INTRAMUSCULAR | Status: DC | PRN
  Administered 2015-07-08: 59 mL via OPHTHALMIC

## 2015-07-08 MED ORDER — POVIDONE-IODINE 5 % OP SOLN
1.0000 "application " | OPHTHALMIC | Status: DC | PRN
  Administered 2015-07-08: 1 via OPHTHALMIC

## 2015-07-08 MED ORDER — BRIMONIDINE TARTRATE 0.2 % OP SOLN
OPHTHALMIC | Status: DC | PRN
  Administered 2015-07-08: 1 [drp] via OPHTHALMIC

## 2015-07-08 MED ORDER — MIDAZOLAM HCL 2 MG/2ML IJ SOLN
INTRAMUSCULAR | Status: DC | PRN
  Administered 2015-07-08: 2 mg via INTRAVENOUS

## 2015-07-08 SURGICAL SUPPLY — 27 items
CANNULA ANT/CHMB 27G (MISCELLANEOUS) ×1 IMPLANT
CANNULA ANT/CHMB 27GA (MISCELLANEOUS) ×3 IMPLANT
CARTRIDGE ABBOTT (MISCELLANEOUS) IMPLANT
GLOVE SURG LX 7.5 STRW (GLOVE) ×2
GLOVE SURG LX STRL 7.5 STRW (GLOVE) ×1 IMPLANT
GLOVE SURG TRIUMPH 8.0 PF LTX (GLOVE) ×3 IMPLANT
GOWN STRL REUS W/ TWL LRG LVL3 (GOWN DISPOSABLE) ×2 IMPLANT
GOWN STRL REUS W/TWL LRG LVL3 (GOWN DISPOSABLE) ×6
LENS IOL TECNIS SYMFONY 17.0 (Intraocular Lens) ×2 IMPLANT
MARKER SKIN DUAL TIP RULER LAB (MISCELLANEOUS) ×3 IMPLANT
NDL FILTER BLUNT 18X1 1/2 (NEEDLE) ×1 IMPLANT
NDL RETROBULBAR .5 NSTRL (NEEDLE) IMPLANT
NEEDLE FILTER BLUNT 18X 1/2SAF (NEEDLE) ×2
NEEDLE FILTER BLUNT 18X1 1/2 (NEEDLE) ×1 IMPLANT
PACK CATARACT BRASINGTON (MISCELLANEOUS) ×3 IMPLANT
PACK EYE AFTER SURG (MISCELLANEOUS) ×3 IMPLANT
PACK OPTHALMIC (MISCELLANEOUS) ×3 IMPLANT
RING MALYGIN 7.0 (MISCELLANEOUS) IMPLANT
SUT ETHILON 10-0 CS-B-6CS-B-6 (SUTURE)
SUT VICRYL  9 0 (SUTURE)
SUT VICRYL 9 0 (SUTURE) IMPLANT
SUTURE EHLN 10-0 CS-B-6CS-B-6 (SUTURE) IMPLANT
SYR 3ML LL SCALE MARK (SYRINGE) ×3 IMPLANT
SYR 5ML LL (SYRINGE) ×3 IMPLANT
SYR TB 1ML LUER SLIP (SYRINGE) ×3 IMPLANT
WATER STERILE IRR 250ML POUR (IV SOLUTION) ×3 IMPLANT
WIPE NON LINTING 3.25X3.25 (MISCELLANEOUS) ×3 IMPLANT

## 2015-07-08 NOTE — H&P (Signed)
  The History and Physical notes are on paper, have been signed, and are to be scanned. The patient remains stable and unchanged from the H&P.   Previous H&P reviewed, patient examined, and there are no changes.  Anthony Kim 07/08/2015 8:54 AM

## 2015-07-08 NOTE — Anesthesia Postprocedure Evaluation (Signed)
Anesthesia Post Note  Patient: Anthony Kim.  Procedure(s) Performed: Procedure(s) (LRB): CATARACT EXTRACTION PHACO AND INTRAOCULAR LENS PLACEMENT (IOC) RIGHT EYE (Right)  Patient location during evaluation: PACU Anesthesia Type: MAC Level of consciousness: awake and alert and oriented Pain management: pain level controlled Vital Signs Assessment: post-procedure vital signs reviewed and stable Respiratory status: spontaneous breathing and nonlabored ventilation Cardiovascular status: stable Postop Assessment: no signs of nausea or vomiting and adequate PO intake Anesthetic complications: no    Estill Batten

## 2015-07-08 NOTE — Transfer of Care (Signed)
Immediate Anesthesia Transfer of Care Note  Patient: Anthony Kim.  Procedure(s) Performed: Procedure(s) with comments: CATARACT EXTRACTION PHACO AND INTRAOCULAR LENS PLACEMENT (IOC) RIGHT EYE (Right) -  SYMFONY LENS  Patient Location: PACU  Anesthesia Type: MAC  Level of Consciousness: awake, alert  and patient cooperative  Airway and Oxygen Therapy: Patient Spontanous Breathing and Patient connected to supplemental oxygen  Post-op Assessment: Post-op Vital signs reviewed, Patient's Cardiovascular Status Stable, Respiratory Function Stable, Patent Airway and No signs of Nausea or vomiting  Post-op Vital Signs: Reviewed and stable  Complications: No apparent anesthesia complications

## 2015-07-08 NOTE — Anesthesia Procedure Notes (Signed)
Procedure Name: MAC Performed by: Emillee Talsma Pre-anesthesia Checklist: Patient identified, Emergency Drugs available, Suction available, Timeout performed and Patient being monitored Patient Re-evaluated:Patient Re-evaluated prior to inductionOxygen Delivery Method: Nasal cannula Placement Confirmation: positive ETCO2     

## 2015-07-08 NOTE — Op Note (Signed)
LOCATION:  [place]   PREOPERATIVE DIAGNOSIS:    Nuclear sclerotic cataract right eye. H25.11   POSTOPERATIVE DIAGNOSIS:  Nuclear sclerotic cataract right eye.     PROCEDURE:  Phacoemusification with posterior chamber intraocular lens placement of the right eye   LENS:   Implant Name Type Inv. Item Serial No. Manufacturer Lot No. LRB No. Used  TECHNIS SYMFONY     [id number] [employer]   Right 1     [id number] 17.0 D PCIOL   ULTRASOUND TIME: 11 % of 1 minutes, 6 seconds.  CDE 7.4   SURGEON:  Wyonia Hough, MD   ANESTHESIA:  Topical with tetracaine drops and 2% Xylocaine jelly, augmented with 1% preservative-free intracameral lidocaine.    COMPLICATIONS:  None.   DESCRIPTION OF PROCEDURE:  The patient was identified in the holding room and transported to the operating room and placed in the supine position under the operating microscope.  The right eye was identified as the operative eye and it was prepped and draped in the usual sterile ophthalmic fashion.   A 1 millimeter clear-corneal paracentesis was made at the 12:00 position.  0.5 ml of preservative-free 1% lidocaine was injected into the anterior chamber. The anterior chamber was filled with Viscoat viscoelastic.  A 2.4 millimeter keratome was used to make a near-clear corneal incision at the 9:00 position.  A curvilinear capsulorrhexis was made with a cystotome and capsulorrhexis forceps.  Balanced salt solution was used to hydrodissect and hydrodelineate the nucleus.   Phacoemulsification was then used in stop and chop fashion to remove the lens nucleus and epinucleus.  The remaining cortex was then removed using the irrigation and aspiration handpiece. Provisc was then placed into the capsular bag to distend it for lens placement.  A lens was then injected into the capsular bag.  The remaining viscoelastic was aspirated.   Wounds were hydrated with balanced salt solution.  The anterior chamber was  inflated to a physiologic pressure with balanced salt solution.  No wound leaks were noted. Cefuroxime 0.1 ml of a 10mg /ml solution was injected into the anterior chamber for a dose of 1 mg of intracameral antibiotic at the completion of the case.    Timolol and Brimonidine drops were applied to the eye.  The patient was taken to the recovery room in stable condition without complications of anesthesia or surgery.   Ayari Liwanag 07/08/2015, 10:13 AM

## 2015-07-08 NOTE — Anesthesia Preprocedure Evaluation (Addendum)
Anesthesia Evaluation  Patient identified by MRN, date of birth, ID band Patient awake    Reviewed: Allergy & Precautions, NPO status , Patient's Chart, lab work & pertinent test results  Airway Mallampati: II  TM Distance: >3 FB Neck ROM: Full    Dental no notable dental hx.    Pulmonary sleep apnea , neg pneumonia , former smoker,    Pulmonary exam normal        Cardiovascular hypertension, Pt. on medications Normal cardiovascular exam     Neuro/Psych PSYCHIATRIC DISORDERS Anxiety Depression    GI/Hepatic Neg liver ROS, GERD  Medicated and Controlled,  Endo/Other  negative endocrine ROS  Renal/GU CRFRenal disease     Musculoskeletal  (+) Arthritis ,   Abdominal   Peds  Hematology H/O DVT   Anesthesia Other Findings   Reproductive/Obstetrics                           Anesthesia Physical Anesthesia Plan  ASA: II  Anesthesia Plan: MAC   Post-op Pain Management:    Induction: Intravenous  Airway Management Planned:   Additional Equipment:   Intra-op Plan:   Post-operative Plan:   Informed Consent: I have reviewed the patients History and Physical, chart, labs and discussed the procedure including the risks, benefits and alternatives for the proposed anesthesia with the patient or authorized representative who has indicated his/her understanding and acceptance.     Plan Discussed with: CRNA  Anesthesia Plan Comments:         Anesthesia Quick Evaluation

## 2015-07-09 ENCOUNTER — Other Ambulatory Visit: Payer: Self-pay | Admitting: Family Medicine

## 2015-07-09 ENCOUNTER — Other Ambulatory Visit: Payer: Self-pay | Admitting: Cardiovascular Disease

## 2015-07-09 ENCOUNTER — Encounter: Payer: Self-pay | Admitting: Ophthalmology

## 2015-07-10 ENCOUNTER — Other Ambulatory Visit: Payer: Self-pay | Admitting: Family Medicine

## 2015-07-15 ENCOUNTER — Ambulatory Visit (INDEPENDENT_AMBULATORY_CARE_PROVIDER_SITE_OTHER): Payer: Medicare Other | Admitting: *Deleted

## 2015-07-15 DIAGNOSIS — Z7901 Long term (current) use of anticoagulants: Secondary | ICD-10-CM | POA: Diagnosis not present

## 2015-07-15 DIAGNOSIS — Z5181 Encounter for therapeutic drug level monitoring: Secondary | ICD-10-CM

## 2015-07-15 DIAGNOSIS — I82409 Acute embolism and thrombosis of unspecified deep veins of unspecified lower extremity: Secondary | ICD-10-CM

## 2015-07-15 LAB — POCT INR: INR: 2.6

## 2015-07-30 ENCOUNTER — Encounter: Payer: Self-pay | Admitting: *Deleted

## 2015-07-30 DIAGNOSIS — H2512 Age-related nuclear cataract, left eye: Secondary | ICD-10-CM | POA: Diagnosis not present

## 2015-07-31 ENCOUNTER — Encounter (INDEPENDENT_AMBULATORY_CARE_PROVIDER_SITE_OTHER): Payer: Self-pay

## 2015-07-31 ENCOUNTER — Ambulatory Visit (INDEPENDENT_AMBULATORY_CARE_PROVIDER_SITE_OTHER): Payer: Medicare Other

## 2015-07-31 DIAGNOSIS — Z5181 Encounter for therapeutic drug level monitoring: Secondary | ICD-10-CM

## 2015-07-31 DIAGNOSIS — Z7901 Long term (current) use of anticoagulants: Secondary | ICD-10-CM | POA: Diagnosis not present

## 2015-07-31 DIAGNOSIS — I82409 Acute embolism and thrombosis of unspecified deep veins of unspecified lower extremity: Secondary | ICD-10-CM | POA: Diagnosis not present

## 2015-07-31 LAB — POCT INR: INR: 3

## 2015-07-31 NOTE — Discharge Instructions (Signed)

## 2015-08-04 ENCOUNTER — Other Ambulatory Visit: Payer: Self-pay | Admitting: Cardiovascular Disease

## 2015-08-05 ENCOUNTER — Ambulatory Visit: Payer: Medicare Other | Admitting: Student in an Organized Health Care Education/Training Program

## 2015-08-05 ENCOUNTER — Ambulatory Visit
Admission: RE | Admit: 2015-08-05 | Discharge: 2015-08-05 | Disposition: A | Discharge status: Home / Self Care | Payer: Medicare Other | Source: Ambulatory Visit | Attending: Ophthalmology | Admitting: Ophthalmology

## 2015-08-05 ENCOUNTER — Encounter
Admission: RE | Disposition: A | Discharge status: Home / Self Care | Payer: Self-pay | Source: Ambulatory Visit | Attending: Ophthalmology

## 2015-08-05 DIAGNOSIS — Z86718 Personal history of other venous thrombosis and embolism: Secondary | ICD-10-CM | POA: Diagnosis not present

## 2015-08-05 DIAGNOSIS — I10 Essential (primary) hypertension: Secondary | ICD-10-CM | POA: Diagnosis not present

## 2015-08-05 DIAGNOSIS — M1990 Unspecified osteoarthritis, unspecified site: Secondary | ICD-10-CM | POA: Diagnosis not present

## 2015-08-05 DIAGNOSIS — Z87891 Personal history of nicotine dependence: Secondary | ICD-10-CM | POA: Diagnosis not present

## 2015-08-05 DIAGNOSIS — H2512 Age-related nuclear cataract, left eye: Secondary | ICD-10-CM | POA: Insufficient documentation

## 2015-08-05 DIAGNOSIS — K219 Gastro-esophageal reflux disease without esophagitis: Secondary | ICD-10-CM | POA: Diagnosis not present

## 2015-08-05 DIAGNOSIS — Z79899 Other long term (current) drug therapy: Secondary | ICD-10-CM | POA: Diagnosis not present

## 2015-08-05 DIAGNOSIS — G4730 Sleep apnea, unspecified: Secondary | ICD-10-CM | POA: Insufficient documentation

## 2015-08-05 HISTORY — PX: CATARACT EXTRACTION W/PHACO: SHX586

## 2015-08-05 SURGERY — PHACOEMULSIFICATION, CATARACT, WITH IOL INSERTION
Anesthesia: Monitor Anesthesia Care | Site: Eye | Laterality: Left | Wound class: Clean

## 2015-08-05 MED ORDER — LACTATED RINGERS IV SOLN
INTRAVENOUS | Status: DC
Start: 2015-08-05 — End: 2015-08-05

## 2015-08-05 MED ORDER — POVIDONE-IODINE 5 % OP SOLN
1.0000 "application " | OPHTHALMIC | Status: DC | PRN
  Administered 2015-08-05: 1 via OPHTHALMIC

## 2015-08-05 MED ORDER — ACETAMINOPHEN 325 MG PO TABS: 325.0000 mg | ORAL_TABLET | ORAL | Status: DC | PRN

## 2015-08-05 MED ORDER — NA HYALUR & NA CHOND-NA HYALUR 0.4-0.35 ML IO KIT
PACK | INTRAOCULAR | Status: DC | PRN
Start: 2015-08-05 — End: 2015-08-05
  Administered 2015-08-05: 1 mL via INTRAOCULAR

## 2015-08-05 MED ORDER — FENTANYL CITRATE (PF) 100 MCG/2ML IJ SOLN
INTRAMUSCULAR | Status: DC | PRN
  Administered 2015-08-05: 100 ug via INTRAVENOUS

## 2015-08-05 MED ORDER — BRIMONIDINE TARTRATE 0.2 % OP SOLN
OPHTHALMIC | Status: DC | PRN
  Administered 2015-08-05: 1 [drp] via OPHTHALMIC

## 2015-08-05 MED ORDER — TETRACAINE HCL 0.5 % OP SOLN
1.0000 [drp] | OPHTHALMIC | Status: DC | PRN
  Administered 2015-08-05: 1 [drp] via OPHTHALMIC

## 2015-08-05 MED ORDER — ARMC OPHTHALMIC DILATING GEL
1.0000 "application " | OPHTHALMIC | Status: DC | PRN
  Administered 2015-08-05 (×2): 1 via OPHTHALMIC

## 2015-08-05 MED ORDER — TIMOLOL MALEATE 0.5 % OP SOLN
OPHTHALMIC | Status: DC | PRN
  Administered 2015-08-05: 1 [drp] via OPHTHALMIC

## 2015-08-05 MED ORDER — CEFUROXIME OPHTHALMIC INJECTION 1 MG/0.1 ML
INJECTION | OPHTHALMIC | Status: DC | PRN
Start: 2015-08-05 — End: 2015-08-05
  Administered 2015-08-05: 0.1 mL via INTRACAMERAL

## 2015-08-05 MED ORDER — MIDAZOLAM HCL 2 MG/2ML IJ SOLN
INTRAMUSCULAR | Status: DC | PRN
  Administered 2015-08-05: 2 mg via INTRAVENOUS

## 2015-08-05 MED ORDER — LACTATED RINGERS IV SOLN: 500.0000 mL | INTRAVENOUS | Status: DC

## 2015-08-05 MED ORDER — ACETAMINOPHEN 160 MG/5ML PO SOLN: 325.0000 mg | ORAL | Status: DC | PRN

## 2015-08-05 MED ORDER — EPINEPHRINE HCL 1 MG/ML IJ SOLN
INTRAOCULAR | Status: DC | PRN
  Administered 2015-08-05: 62 mL via OPHTHALMIC

## 2015-08-05 MED ORDER — LIDOCAINE HCL (PF) 4 % IJ SOLN
INTRAOCULAR | Status: DC | PRN
  Administered 2015-08-05: 1 mL via OPHTHALMIC

## 2015-08-05 SURGICAL SUPPLY — 29 items
CANNULA ANT/CHMB 27G (MISCELLANEOUS) ×1 IMPLANT
CANNULA ANT/CHMB 27GA (MISCELLANEOUS) ×3 IMPLANT
CARTRIDGE ABBOTT (MISCELLANEOUS) IMPLANT
GLOVE SURG LX 7.5 STRW (GLOVE) ×2
GLOVE SURG LX STRL 7.5 STRW (GLOVE) ×1 IMPLANT
GLOVE SURG TRIUMPH 8.0 PF LTX (GLOVE) ×3 IMPLANT
GOWN STRL REUS W/ TWL LRG LVL3 (GOWN DISPOSABLE) ×2 IMPLANT
GOWN STRL REUS W/TWL LRG LVL3 (GOWN DISPOSABLE) ×6
LENS IOL ACRSF IQ TRC 7 16.5 (Intraocular Lens) IMPLANT
LENS IOL ACRYSOF IQ TORIC 16.5 (Intraocular Lens) ×3 IMPLANT
LENS IOL IQ TORIC 7U 16.5 (Intraocular Lens) ×1 IMPLANT
MARKER SKIN DUAL TIP RULER LAB (MISCELLANEOUS) ×3 IMPLANT
NDL FILTER BLUNT 18X1 1/2 (NEEDLE) ×1 IMPLANT
NDL RETROBULBAR .5 NSTRL (NEEDLE) IMPLANT
NEEDLE FILTER BLUNT 18X 1/2SAF (NEEDLE) ×2
NEEDLE FILTER BLUNT 18X1 1/2 (NEEDLE) ×1 IMPLANT
PACK CATARACT BRASINGTON (MISCELLANEOUS) ×3 IMPLANT
PACK EYE AFTER SURG (MISCELLANEOUS) ×3 IMPLANT
PACK OPTHALMIC (MISCELLANEOUS) ×3 IMPLANT
RING MALYGIN 7.0 (MISCELLANEOUS) IMPLANT
SUT ETHILON 10-0 CS-B-6CS-B-6 (SUTURE)
SUT VICRYL  9 0 (SUTURE)
SUT VICRYL 9 0 (SUTURE) IMPLANT
SUTURE EHLN 10-0 CS-B-6CS-B-6 (SUTURE) IMPLANT
SYR 3ML LL SCALE MARK (SYRINGE) ×3 IMPLANT
SYR 5ML LL (SYRINGE) ×3 IMPLANT
SYR TB 1ML LUER SLIP (SYRINGE) ×3 IMPLANT
WATER STERILE IRR 250ML POUR (IV SOLUTION) ×3 IMPLANT
WIPE NON LINTING 3.25X3.25 (MISCELLANEOUS) ×3 IMPLANT

## 2015-08-05 NOTE — Transfer of Care (Signed)
Immediate Anesthesia Transfer of Care Note  Patient: Anthony Kim.  Procedure(s) Performed: Procedure(s) with comments: CATARACT EXTRACTION PHACO AND INTRAOCULAR LENS PLACEMENT (IOC) left eye (Left) - SYMFONY LENS  Patient Location: PACU  Anesthesia Type: MAC  Level of Consciousness: awake, alert  and patient cooperative  Airway and Oxygen Therapy: Patient Spontanous Breathing and Patient connected to supplemental oxygen  Post-op Assessment: Post-op Vital signs reviewed, Patient's Cardiovascular Status Stable, Respiratory Function Stable, Patent Airway and No signs of Nausea or vomiting  Post-op Vital Signs: Reviewed and stable  Complications: No apparent anesthesia complications

## 2015-08-05 NOTE — Anesthesia Procedure Notes (Signed)
Procedure Name: MAC Performed by: Mosi Hannold Pre-anesthesia Checklist: Patient identified, Emergency Drugs available, Suction available, Timeout performed and Patient being monitored Patient Re-evaluated:Patient Re-evaluated prior to inductionOxygen Delivery Method: Nasal cannula Placement Confirmation: positive ETCO2       

## 2015-08-05 NOTE — Anesthesia Preprocedure Evaluation (Signed)
Anesthesia Evaluation  Patient identified by MRN, date of birth, ID band Patient awake    Reviewed: Allergy & Precautions, H&P , NPO status , Patient's Chart, lab work & pertinent test results, reviewed documented beta blocker date and time   Airway Mallampati: III  TM Distance: >3 FB Neck ROM: full    Dental no notable dental hx.    Pulmonary sleep apnea and Continuous Positive Airway Pressure Ventilation , neg pneumonia , former smoker,    Pulmonary exam normal breath sounds clear to auscultation       Cardiovascular Exercise Tolerance: Good hypertension, Pt. on medications Normal cardiovascular exam Rhythm:regular Rate:Normal     Neuro/Psych PSYCHIATRIC DISORDERS negative neurological ROS     GI/Hepatic Neg liver ROS, GERD  Medicated,  Endo/Other  negative endocrine ROS  Renal/GU CRFRenal disease  negative genitourinary   Musculoskeletal  (+) Arthritis ,   Abdominal   Peds  Hematology negative hematology ROS (+) H/O DVT   Anesthesia Other Findings   Reproductive/Obstetrics negative OB ROS                             Anesthesia Physical  Anesthesia Plan  ASA: III  Anesthesia Plan: MAC   Post-op Pain Management:    Induction: Intravenous  Airway Management Planned:   Additional Equipment:   Intra-op Plan:   Post-operative Plan:   Informed Consent: I have reviewed the patients History and Physical, chart, labs and discussed the procedure including the risks, benefits and alternatives for the proposed anesthesia with the patient or authorized representative who has indicated his/her understanding and acceptance.     Plan Discussed with: CRNA  Anesthesia Plan Comments:         Anesthesia Quick Evaluation

## 2015-08-05 NOTE — Op Note (Signed)
LOCATION:  [place]   PREOPERATIVE DIAGNOSIS:  Nuclear sclerotic cataract of the left eye.  H25.12  POSTOPERATIVE DIAGNOSIS:  Nuclear sclerotic cataract of the left eye.   PROCEDURE:  Phacoemulsification with Toric posterior chamber intraocular lens placement of the left eye.   LENS:  Implant Name Type Inv. Item Serial No. Manufacturer Lot No. LRB No. Used  LENS IOL TORIC 16.5 - [id number] Intraocular Lens LENS IOL TORIC 16.5 [id number] [employer]   Left 1   AMO Symfony Toric 16.5 DToric intraocular lens with 1.5 diopters of cylindrical power with axis orientation at 9 degrees.   ULTRASOUND TIME: 17 % of 0 minutes, 54 seconds.  CDE 9.2   SURGEON:  Wyonia Hough, MD   ANESTHESIA:  Topical with tetracaine drops and 2% Xylocaine jelly, augmented with 1% preservative-free intracameral lidocaine.  COMPLICATIONS:  None.   DESCRIPTION OF PROCEDURE:  The patient was identified in the holding room and transported to the operating suite and placed in the supine position under the operating microscope.  The left eye was identified as the operative eye, and it was prepped and draped in the usual sterile ophthalmic fashion.    A clear-corneal paracentesis incision was made at the 1:30 position.  0.5 ml of preservative-free 1% lidocaine was injected into the anterior chamber. The anterior chamber was filled with Viscoat.  A 2.4 millimeter near clear corneal incision was then made at the 10:30 position.  A cystotome and capsulorrhexis forceps were then used to make a curvilinear capsulorrhexis.  Hydrodissection and hydrodelineation were then performed using balanced salt solution.   Phacoemulsification was then used in stop and chop fashion to remove the lens, nucleus and epinucleus.  The remaining cortex was aspirated using the irrigation and aspiration handpiece.  Provisc viscoelastic was then placed into the capsular bag to distend it for lens placement.  The Verion digital marker was  used to align the implant at the intended axis.   A 16.5 diopter lens was then injected into the capsular bag.  It was rotated clockwise until the axis marks on the lens were approximately 15 degrees in the counterclockwise direction to the intended alignment.  The viscoelastic was aspirated from the eye using the irrigation aspiration handpiece.  Then, a Koch spatula through the sideport incision was used to rotate the lens in a clockwise direction until the axis markings of the intraocular lens were lined up with the Verion alignment.  Balanced salt solution was then used to hydrate the wounds. Cefuroxime 0.1 ml of a 10mg /ml solution was injected into the anterior chamber for a dose of 1 mg of intracameral antibiotic at the completion of the case.    The eye was noted to have a physiologic pressure and there was no wound leak noted.   Timolol and Brimonidine drops were applied to the eye.  The patient was taken to the recovery room in stable condition having had no complications of anesthesia or surgery.  Icela Glymph 08/05/2015, 10:39 AM

## 2015-08-05 NOTE — Anesthesia Postprocedure Evaluation (Signed)
Anesthesia Post Note  Patient: Anthony Kim.  Procedure(s) Performed: Procedure(s) (LRB): CATARACT EXTRACTION PHACO AND INTRAOCULAR LENS PLACEMENT (IOC) RIGHT EYE (Right)  Patient location during evaluation: PACU Anesthesia Type: MAC Level of consciousness: awake and alert and oriented Pain management: pain level controlled Vital Signs Assessment: post-procedure vital signs reviewed and stable Respiratory status: spontaneous breathing and nonlabored ventilation Cardiovascular status: stable Postop Assessment: no signs of nausea or vomiting and adequate PO intake Anesthetic complications: no    Shamir Sedlar D Yailene Badia

## 2015-08-05 NOTE — H&P (Signed)
  The History and Physical notes are on paper, have been signed, and are to be scanned. The patient remains stable and unchanged from the H&P.   Previous H&P reviewed, patient examined, and there are no changes.  Bernard Donahoo 08/05/2015 9:51 AM

## 2015-08-06 ENCOUNTER — Encounter: Payer: Self-pay | Admitting: Ophthalmology

## 2015-08-12 ENCOUNTER — Ambulatory Visit (INDEPENDENT_AMBULATORY_CARE_PROVIDER_SITE_OTHER): Payer: Medicare Other

## 2015-08-12 DIAGNOSIS — Z5181 Encounter for therapeutic drug level monitoring: Secondary | ICD-10-CM

## 2015-08-12 DIAGNOSIS — I82409 Acute embolism and thrombosis of unspecified deep veins of unspecified lower extremity: Secondary | ICD-10-CM

## 2015-08-12 DIAGNOSIS — Z7901 Long term (current) use of anticoagulants: Secondary | ICD-10-CM | POA: Diagnosis not present

## 2015-08-12 LAB — POCT INR: INR: 2.7

## 2015-08-18 HISTORY — PX: CATARACT EXTRACTION, BILATERAL: SHX1313

## 2015-09-03 ENCOUNTER — Other Ambulatory Visit: Payer: Self-pay | Admitting: Family Medicine

## 2015-09-03 NOTE — Telephone Encounter (Signed)
Pt has not had f/u appt since 06/2014. Last Rx indicated OV required. pls advise

## 2015-09-08 ENCOUNTER — Other Ambulatory Visit: Payer: Self-pay | Admitting: Family Medicine

## 2015-09-09 ENCOUNTER — Ambulatory Visit (INDEPENDENT_AMBULATORY_CARE_PROVIDER_SITE_OTHER): Payer: Medicare Other

## 2015-09-09 DIAGNOSIS — I82409 Acute embolism and thrombosis of unspecified deep veins of unspecified lower extremity: Secondary | ICD-10-CM

## 2015-09-09 DIAGNOSIS — Z5181 Encounter for therapeutic drug level monitoring: Secondary | ICD-10-CM

## 2015-09-09 DIAGNOSIS — Z7901 Long term (current) use of anticoagulants: Secondary | ICD-10-CM

## 2015-09-09 LAB — POCT INR: INR: 3.2

## 2015-09-09 NOTE — Telephone Encounter (Signed)
Pt last f/u 06/2014

## 2015-09-10 NOTE — Telephone Encounter (Signed)
Rx called in to requested pharmacy 

## 2015-09-14 ENCOUNTER — Ambulatory Visit: Payer: Medicare Other | Admitting: Cardiovascular Disease

## 2015-09-24 ENCOUNTER — Other Ambulatory Visit: Payer: Self-pay | Admitting: Family Medicine

## 2015-09-24 DIAGNOSIS — E7800 Pure hypercholesterolemia, unspecified: Secondary | ICD-10-CM

## 2015-09-24 DIAGNOSIS — Z0000 Encounter for general adult medical examination without abnormal findings: Secondary | ICD-10-CM

## 2015-09-28 ENCOUNTER — Other Ambulatory Visit: Payer: Self-pay | Admitting: Cardiovascular Disease

## 2015-09-28 ENCOUNTER — Other Ambulatory Visit: Payer: Self-pay | Admitting: Family Medicine

## 2015-09-29 ENCOUNTER — Ambulatory Visit (INDEPENDENT_AMBULATORY_CARE_PROVIDER_SITE_OTHER): Payer: Medicare Other

## 2015-09-29 ENCOUNTER — Other Ambulatory Visit (INDEPENDENT_AMBULATORY_CARE_PROVIDER_SITE_OTHER): Payer: Medicare Other

## 2015-09-29 VITALS — BP 142/86 | HR 55 | Temp 97.7°F | Ht 70.0 in | Wt 250.2 lb

## 2015-09-29 DIAGNOSIS — Z1159 Encounter for screening for other viral diseases: Secondary | ICD-10-CM | POA: Diagnosis not present

## 2015-09-29 DIAGNOSIS — E7800 Pure hypercholesterolemia, unspecified: Secondary | ICD-10-CM

## 2015-09-29 DIAGNOSIS — Z0000 Encounter for general adult medical examination without abnormal findings: Secondary | ICD-10-CM

## 2015-09-29 LAB — COMPREHENSIVE METABOLIC PANEL
ALBUMIN: 4.2 g/dL (ref 3.5–5.2)
ALK PHOS: 43 U/L (ref 39–117)
ALT: 19 U/L (ref 0–53)
AST: 20 U/L (ref 0–37)
BUN: 24 mg/dL — AB (ref 6–23)
CHLORIDE: 107 meq/L (ref 96–112)
CO2: 29 mEq/L (ref 19–32)
CREATININE: 1.35 mg/dL (ref 0.40–1.50)
Calcium: 9.1 mg/dL (ref 8.4–10.5)
GFR: 55.33 mL/min — ABNORMAL LOW (ref >60.00)
GLUCOSE: 105 mg/dL — AB (ref 70–99)
Potassium: 4.6 mEq/L (ref 3.5–5.1)
SODIUM: 144 meq/L (ref 135–145)
TOTAL PROTEIN: 7 g/dL (ref 6.0–8.3)
Total Bilirubin: 0.4 mg/dL (ref 0.2–1.2)

## 2015-09-29 LAB — CBC WITH DIFFERENTIAL/PLATELET
BASOS ABS: 0 10*3/uL (ref 0.0–0.1)
Basophils Relative: 0.6 % (ref 0.0–3.0)
EOS ABS: 0.2 10*3/uL (ref 0.0–0.7)
Eosinophils Relative: 3.6 % (ref 0.0–5.0)
HCT: 42.2 % (ref 39.0–52.0)
HEMOGLOBIN: 14.4 g/dL (ref 13.0–17.0)
LYMPHS ABS: 2.4 10*3/uL (ref 0.7–4.0)
Lymphocytes Relative: 41.1 % (ref 12.0–46.0)
MCHC: 34.1 g/dL (ref 30.0–36.0)
MCV: 90.8 fl (ref 78.0–100.0)
MONO ABS: 0.5 10*3/uL (ref 0.1–1.0)
Monocytes Relative: 7.8 % (ref 3.0–12.0)
NEUTROS PCT: 46.9 % (ref 43.0–77.0)
Neutro Abs: 2.7 10*3/uL (ref 1.4–7.7)
Platelets: 170 10*3/uL (ref 150.0–400.0)
RBC: 4.64 Mil/uL (ref 4.22–5.81)
RDW: 13.8 % (ref 11.5–15.5)
WBC: 5.8 10*3/uL (ref 4.0–10.5)

## 2015-09-29 LAB — LIPID PANEL
CHOLESTEROL: 149 mg/dL (ref 0–200)
HDL: 43.9 mg/dL (ref >39.00)
LDL CALC: 83 mg/dL (ref 0–99)
NONHDL: 105.46
Total CHOL/HDL Ratio: 3
Triglycerides: 112 mg/dL (ref 0.0–149.0)
VLDL: 22.4 mg/dL (ref 0.0–40.0)

## 2015-09-29 LAB — PSA: PSA: 0.64 ng/mL (ref 0.10–4.00)

## 2015-09-29 NOTE — Progress Notes (Signed)
Subjective:   Anthony Kim. is a 70 y.o. male who presents for Medicare Annual/Subsequent preventive examination.  Review of Systems:  N/A Cardiac Risk Factors include: advanced age (>6men, >20 women);male gender;smoking/ tobacco exposure;obesity (BMI >30kg/m2);dyslipidemia;hypertension     Objective:    Vitals: BP (!) 142/86 (BP Location: Left Arm, Patient Position: Sitting, Cuff Size: Normal)   Pulse (!) 55   Temp 97.7 F (36.5 C) (Oral)   Ht 5\' 10"  (1.778 m)   Wt 250 lb 4 oz (113.5 kg)   SpO2 96%   BMI 35.91 kg/m   Body mass index is 35.91 kg/m.  Tobacco History  Smoking Status  . Former Smoker  . Packs/day: 1.00  . Years: 30.00  . Types: Cigarettes  . Quit date: 01/18/1992  Smokeless Tobacco  . Never Used     Counseling given: No   Past Medical History:  Diagnosis Date  . Anxiety   . Clotting disorder ([place])   . Colon polyps    2008 Tubular Adenoma  . Cough    chronic  . Depression   . DVT (deep venous thrombosis) (H[facility])    hx of blood clot in right leg 8 yrs ago  . GERD (gastroesophageal reflux disease)   . Hyperlipidemia   . Hypertension    controlled on meds  . IBS (irritable bowel syndrome)   . Irregular heart beat   . Palpitations    arryhmia/Dr Marcello Moores Wall/ Dr Gollam/ [facility]  . Pneumonia    3 wks ago/ no hospitalization/ no follow up required/ was on antibiotic and steroids/Dr Edilia Bo  . Presence of partial dental prosthetic device    lower right  . Seasonal allergies    Past Surgical History:  Procedure Laterality Date  . APPENDECTOMY     1967  . CATARACT EXTRACTION W/PHACO Right 07/08/2015   Procedure: CATARACT EXTRACTION PHACO AND INTRAOCULAR LENS PLACEMENT ([place]) RIGHT EYE;  Surgeon: Leandrew Koyanagi, MD;  Location: [place] [facility] [place];  Service: Ophthalmology;  Laterality: Right;   SYMFONY LENS  . CATARACT EXTRACTION W/PHACO Left 08/05/2015   Procedure: CATARACT EXTRACTION PHACO AND INTRAOCULAR LENS PLACEMENT ([place])  left eye;  Surgeon: Leandrew Koyanagi, MD;  Location: [place];  Service: Ophthalmology;  Laterality: Left;  SYMFONY LENS  . COLONOSCOPY     x3  . POLYPECTOMY    . TONSILLECTOMY AND ADENOIDECTOMY     Family History  Problem Relation Age of Onset  . Heart disease Mother   . Hypertension Mother   . Irritable bowel syndrome Mother   . Heart attack Father   . Hypertension Father   . Prostate cancer Father    History  Sexual Activity  . Sexual activity: Yes    Outpatient Encounter Prescriptions as of 09/29/2015  Medication Sig  . acetaminophen (TYLENOL) 325 MG tablet Take 325 mg by mouth every 6 (six) hours as needed for pain.   Marland Kitchen ALPRAZOLAM XR 0.5 MG 24 hr tablet TAKE 1 TABLET DAILY.  Marland Kitchen atorvastatin (LIPITOR) 40 MG tablet Take 1 tablet (40 mg total) by mouth daily. (Patient taking differently: Take 40 mg by mouth daily. am)  . buPROPion (WELLBUTRIN XL) 150 MG 24 hr tablet Take 1 tablet (150 mg total) by mouth daily. OFFICE VISIT REQUIRED FOR ADDITIONAL REFILLS  . fenofibrate (TRICOR) 145 MG tablet Take 1 tablet (145 mg total) by mouth daily. (Patient taking differently: Take 145 mg by mouth daily. am)  . fexofenadine (ALLEGRA) 180 MG tablet Take 180 mg  by mouth daily. am  . fluticasone (FLONASE) 50 MCG/ACT nasal spray Place 2 sprays into both nostrils as needed. Reported on 07/08/2015  . losartan (COZAAR) 100 MG tablet Take 1 tablet (100 mg total) by mouth daily.  . meclizine (ANTIVERT) 25 MG tablet Take 25 mg by mouth 3 (three) times daily as needed for dizziness. Reported on 07/01/2015  . Multiple Vitamin (MULTIVITAMIN PO) Take 1 tablet by mouth daily. pm  . Omega-3 Fatty Acids (FISH OIL) 1000 MG CAPS Take 1 capsule by mouth daily. pm  . omeprazole (PRILOSEC) 40 MG capsule Take 1 capsule (40 mg total) by mouth daily.  Marland Kitchen warfarin (COUMADIN) 5 MG tablet Take as directed by Anticoagulation clinic (Patient taking differently: Take as directed by Anticoagulation clinic/pm)  .  ZETIA 10 MG tablet Take 1 tablet (10 mg total) by mouth daily.  . [DISCONTINUED] ezetimibe (ZETIA) 10 MG tablet Take 10 mg by mouth daily. am  . [DISCONTINUED] omega-3 acid ethyl esters (LOVAZA) 1 g capsule Take 1 g by mouth daily.  Marland Kitchen guaiFENesin-codeine 100-10 MG/5ML syrup Take 5 mLs by mouth every 8 (eight) hours as needed for cough. (Patient not taking: Reported on 07/08/2015)   No facility-administered encounter medications on file as of 09/29/2015.     Activities of Daily Living In your present state of health, do you have any difficulty performing the following activities: 09/29/2015 08/05/2015  Hearing? N N  Vision? N N  Difficulty concentrating or making decisions? N N  Walking or climbing stairs? N N  Dressing or bathing? N N  Doing errands, shopping? N -  Preparing Food and eating ? N -  Using the Toilet? N -  In the past six months, have you accidently leaked urine? N -  Do you have problems with loss of bowel control? N -  Managing your Medications? N -  Managing your Finances? N -  Housekeeping or managing your Housekeeping? N -  Some recent data might be hidden    Patient Care Team: Lucille Passy, MD as PCP - General Minna Merritts, MD as Consulting Physician (Cardiology) Inda Castle, MD as Consulting Physician (Gastroenterology) Blane Ohara McDiarmid, MD as Consulting Physician (Family Medicine) Bjorn Loser, MD as Consulting Physician (Urology) Philis Kendall, MD as Consulting Physician (Ophthalmology) Gaynelle Arabian, MD as Consulting Physician (Orthopedic Surgery)   Dr. Wallace Going - Ophthalmology  Assessment:      Hearing Screening   125Hz  250Hz  500Hz  1000Hz  2000Hz  3000Hz  4000Hz  6000Hz  8000Hz   Right ear:   40 40 40  0    Left ear:   40 40 0  40    Vision Screening Comments: Last vision exam with Dr. Wallace Going on 09/25/15   Exercise Activities and Dietary recommendations Current Exercise Habits: Home exercise routine, Type of exercise: walking, Time  (Minutes): 60, Frequency (Times/Week): 3, Weekly Exercise (Minutes/Week): 180, Intensity: Mild, Exercise limited by: None identified  Goals    . Increase physical activity          Starting 09/29/2015, I will continue to exercise for at least 60 min 3 days per week.       Fall Risk Fall Risk  06/23/2014 03/22/2012  Falls in the past year? No No   Depression Screen PHQ 2/9 Scores 09/29/2015 06/23/2014 03/22/2012  PHQ - 2 Score 0 0 0    Cognitive Testing MMSE - Mini Mental State Exam 09/29/2015  Orientation to time 5  Orientation to Place 5  Registration 3  Attention/ Calculation 0  Recall 3  Language- name 2 objects 0  Language- repeat 1  Language- follow 3 step command 3  Language- read & follow direction 0  Write a sentence 0  Copy design 0  Total score 20   PLEASE NOTE: A Mini-Cog screen was completed. Maximum score is 20. A value of 0 denotes this part of [place] MMSE was not completed or the patient failed this part of the Mini-Cog screening.   Mini-Cog Screening Orientation to Time - Max 5 pts Orientation to Place - Max 5 pts Registration - Max 3 pts Recall - Max 3 pts Language Repeat - Max 1 pts Language Follow 3 Step Command - Max 3 pts  Immunization History  Administered Date(s) Administered  . Influenza Split 10/31/2011  . Influenza Whole 10/30/2007, 10/28/2008, 10/27/2009  . Influenza,inj,Quad PF,36+ Mos 11/13/2013, 10/14/2014  . Pneumococcal Conjugate-13 11/13/2013  . Pneumococcal Polysaccharide-23 03/23/2011  . Td 08/03/2000, 09/16/2008  . Zoster 10/28/2008   Screening Tests Health Maintenance  Topic Date Due  . INFLUENZA VACCINE  10/01/2015 (Originally 08/18/2015)  . Hepatitis C Screening  09/28/2016 (Originally 07/31/1945)  . COLONOSCOPY  04/13/2016  . TETANUS/TDAP  09/17/2018  . ZOSTAVAX  Completed  . PNA vac Low Risk Adult  Completed      Plan:     I have personally reviewed and addressed the Medicare Annual Wellness questionnaire and have noted  the following in the patient's chart:  A. Medical and social history B. Use of alcohol, tobacco or illicit drugs  C. Current medications and supplements D. Functional ability and status E.  Nutritional status F.  Physical activity G. Advance directives H. List of other physicians I.  Hospitalizations, surgeries, and ER visits in previous 12 months J.  [place] to include hearing, vision, cognitive, depression L. Referrals and appointments - none  In addition, I have reviewed and discussed with patient certain preventive protocols, quality metrics, and best practice recommendations. A written personalized care plan for preventive services as well as general preventive health recommendations were provided to patient.  See attached scanned questionnaire for additional information.   Signed,   Lindell Noe, MHA, BS, LPN Health Advisor

## 2015-09-29 NOTE — Progress Notes (Signed)
Pre visit review using our clinic review tool, if applicable. No additional management support is needed unless otherwise documented below in the visit note. 

## 2015-09-29 NOTE — Progress Notes (Signed)
I reviewed health advisor's note, was available for consultation, and agree with documentation and plan.  

## 2015-09-29 NOTE — Patient Instructions (Signed)
Anthony Kim , Thank you for taking time to come for your Medicare Wellness Visit. I appreciate your ongoing commitment to your health goals. Please review the following plan we discussed and let me know if I can assist you in the future.   These are the goals we discussed: Goals    . Increase physical activity          Starting 09/29/2015, I will continue to exercise for at least 60 min 3 days per week.        This is a list of the screening recommended for you and due dates:  Health Maintenance  Topic Date Due  . Flu Shot  10/01/2015*  .  Hepatitis C: One time screening is recommended by Center for Disease Control  (CDC) for  adults born from [birth year] through 1966.   09/28/2016*  . Colon Cancer Screening  04/13/2016  . Tetanus Vaccine  09/17/2018  . Shingles Vaccine  Completed  . Pneumonia vaccines  Completed  *Topic was postponed. The date shown is not the original due date.   Preventive Care for Adults  A healthy lifestyle and preventive care can promote health and wellness. Preventive health guidelines for adults include the following key practices.  . A routine yearly physical is a good way to check with your health care provider about your health and preventive screening. It is a chance to share any concerns and updates on your health and to receive a thorough exam.  . Visit your dentist for a routine exam and preventive care every 6 months. Brush your teeth twice a day and floss once a day. Good oral hygiene prevents tooth decay and gum disease.  . The frequency of eye exams is based on your age, health, family medical history, use  of contact lenses, and other factors. Follow your health care provider's ecommendations for frequency of eye exams.  . Eat a healthy diet. Foods like vegetables, fruits, whole grains, low-fat dairy products, and lean protein foods contain the nutrients you need without too many calories. Decrease your intake of foods high in solid fats, added sugars, and  salt. Eat the right amount of calories for you. Get information about a proper diet from your health care provider, if necessary.  . Regular physical exercise is one of the most important things you can do for your health. Most adults should get at least 150 minutes of moderate-intensity exercise (any activity that increases your heart rate and causes you to sweat) each week. In addition, most adults need muscle-strengthening exercises on 2 or more days a week.  Silver Sneakers may be a benefit available to you. To determine eligibility, you may visit the website: www.silversneakers.com or contact program at (617) 366-4129 Mon-Fri between 8AM-8PM.   . Maintain a healthy weight. The body mass index (BMI) is a screening tool to identify possible weight problems. It provides an estimate of body fat based on height and weight. Your health care provider can find your BMI and can help you achieve or maintain a healthy weight.   For adults 20 years and older: ? A BMI below 18.5 is considered underweight. ? A BMI of 18.5 to 24.9 is normal. ? A BMI of 25 to 29.9 is considered overweight. ? A BMI of 30 and above is considered obese.   . Maintain normal blood lipids and cholesterol levels by exercising and minimizing your intake of saturated fat. Eat a balanced diet with plenty of fruit and vegetables. Blood tests for lipids and  cholesterol should begin at age 71 and be repeated every 5 years. If your lipid or cholesterol levels are high, you are over 50, or you are at high risk for heart disease, you may need your cholesterol levels checked more frequently. Ongoing high lipid and cholesterol levels should be treated with medicines if diet and exercise are not working.  . If you smoke, find out from your health care provider how to quit. If you do not use tobacco, please do not start.  . If you choose to drink alcohol, please do not consume more than 2 drinks per day. One drink is considered to be 12 ounces  (355 mL) of beer, 5 ounces (148 mL) of wine, or 1.5 ounces (44 mL) of liquor.  . If you are 68-48 years old, ask your health care provider if you should take aspirin to prevent strokes.  . Use sunscreen. Apply sunscreen liberally and repeatedly throughout the day. You should seek shade when your shadow is shorter than you. Protect yourself by wearing long sleeves, pants, a wide-brimmed hat, and sunglasses year round, whenever you are outdoors.  . Once a month, do a whole body skin exam, using a mirror to look at the skin on your back. Tell your health care provider of new moles, moles that have irregular borders, moles that are larger than a pencil eraser, or moles that have changed in shape or color.

## 2015-09-29 NOTE — Progress Notes (Signed)
PCP notes:   Health maintenance:  Flu vaccine - pt desires to get vaccine at [facility] Hep C screening - pt will complete with future labs  Abnormal screenings:   Hearing - failed  Patient concerns:   None  Nurse concerns:  None  Next PCP appt:   10/01/2015 @ 0830

## 2015-10-01 ENCOUNTER — Ambulatory Visit (INDEPENDENT_AMBULATORY_CARE_PROVIDER_SITE_OTHER): Payer: Medicare Other | Admitting: Family Medicine

## 2015-10-01 ENCOUNTER — Encounter: Payer: Self-pay | Admitting: Family Medicine

## 2015-10-01 VITALS — BP 154/92 | HR 69 | Temp 97.8°F | Ht 70.5 in | Wt 253.0 lb

## 2015-10-01 DIAGNOSIS — F419 Anxiety disorder, unspecified: Secondary | ICD-10-CM | POA: Diagnosis not present

## 2015-10-01 DIAGNOSIS — E7800 Pure hypercholesterolemia, unspecified: Secondary | ICD-10-CM

## 2015-10-01 DIAGNOSIS — G4730 Sleep apnea, unspecified: Secondary | ICD-10-CM

## 2015-10-01 DIAGNOSIS — Z7901 Long term (current) use of anticoagulants: Secondary | ICD-10-CM | POA: Diagnosis not present

## 2015-10-01 DIAGNOSIS — I10 Essential (primary) hypertension: Secondary | ICD-10-CM | POA: Diagnosis not present

## 2015-10-01 DIAGNOSIS — Z23 Encounter for immunization: Secondary | ICD-10-CM | POA: Diagnosis not present

## 2015-10-01 MED ORDER — BUPROPION HCL ER (XL) 150 MG PO TB24: 150.0000 mg | ORAL_TABLET | Freq: Every day | ORAL | 2 refills | Status: DC

## 2015-10-01 NOTE — Assessment & Plan Note (Addendum)
A little high but has been under reasonable control. Continue to monitor. No changes made to rxs today.

## 2015-10-01 NOTE — Progress Notes (Signed)
Pre visit review using our clinic review tool, if applicable. No additional management support is needed unless otherwise documented below in the visit note. 

## 2015-10-01 NOTE — Patient Instructions (Signed)
Great to see you. Please keep an eye on your blood pressures for me.

## 2015-10-01 NOTE — Assessment & Plan Note (Signed)
Stable on current rxs. No changes made today. 

## 2015-10-01 NOTE — Progress Notes (Signed)
70 yo pleasant male here for follow up of chronic medical conditions and for complaint of right knee pain.  Annual medicare wellness visit with Lindell Noe, RN on 09/29/15.  Notes reviewed.  HTN- tolerating Losartan 100 mg daily.   Followed by Cardiology, Dr. Rockey [place].  Has appt to see him on 10/07/15.  BP Readings from Last 3 Encounters:  10/01/15 (!) 154/92  09/29/15 (!) 142/86  08/05/15 (!) 135/99    Lab Results  Component Value Date   CREATININE 1.35 09/29/2015  No CP, SOB or LE edema.     Wt Readings from Last 3 Encounters:  09/29/15 250 lb 4 oz (113.5 kg)  08/05/15 245 lb (111.1 kg)  07/08/15 244 lb (110.7 kg)    HLD- taking Lipitor and Tricor.   Lab Results  Component Value Date   CHOL 149 09/29/2015   HDL 43.90 09/29/2015   LDLCALC 83 09/29/2015   LDLDIRECT 99.5 06/24/2013   TRIG 112.0 09/29/2015   CHOLHDL 3 09/29/2015    Anxiety/ depression- Restarted Wellbutrin 150 mg XL daily in 07/2012. Continues to take Xanax XR daily.  .  h/o recurrent DVT- lifelong coumadin.  Lab Results  Component Value Date   INR 3.2 09/09/2015   INR 2.7 08/12/2015   INR 3.0 07/31/2015     Patient Active Problem List  Diagnosis  . COLONIC POLYPS 2/08  . HYPERCHOLESTEROLEMIA  . ERECTILE DYSFUNCTION  . DEPRESSION  . Essential hypertension  . History of DVT of lower extremity  . Edema  . SKIN CANCER, HX OF  . ARRHYTHMIA, HX OF 6/98  . Deep vein thrombosis (DVT) ([place])  . Long term current use of anticoagulant  . Anxiety  . Allergic rhinitis  . Sleep apnea  . Encounter for therapeutic drug monitoring  . Decreased GFR  . Obesity   Past Medical History:  Diagnosis Date  . Anxiety   . Clotting disorder ([place])   . Colon polyps    2008 Tubular Adenoma  . Cough    chronic  . Depression   . DVT (deep venous thrombosis) ([facility])    hx of blood clot in right leg 8 yrs ago  . GERD (gastroesophageal reflux disease)   . Hyperlipidemia   . Hypertension    controlled on meds   . IBS (irritable bowel syndrome)   . Irregular heart beat   . Palpitations    arryhmia/Dr Marcello Moores Wall/ Dr Gollam/ [facility]  . Pneumonia    3 wks ago/ no hospitalization/ no follow up required/ was on antibiotic and steroids/Dr Edilia Bo  . Presence of partial dental prosthetic device    lower right  . Seasonal allergies    Past Surgical History:  Procedure Laterality Date  . APPENDECTOMY     1967  . CATARACT EXTRACTION W/PHACO Right 07/08/2015   Procedure: CATARACT EXTRACTION PHACO AND INTRAOCULAR LENS PLACEMENT ([place]) RIGHT EYE;  Surgeon: Leandrew Koyanagi, MD;  Location: [place];  Service: Ophthalmology;  Laterality: Right;   SYMFONY LENS  . CATARACT EXTRACTION W/PHACO Left 08/05/2015   Procedure: CATARACT EXTRACTION PHACO AND INTRAOCULAR LENS PLACEMENT ([place]) left eye;  Surgeon: Leandrew Koyanagi, MD;  Location: [place];  Service: Ophthalmology;  Laterality: Left;  SYMFONY LENS  . COLONOSCOPY     x3  . POLYPECTOMY    . TONSILLECTOMY AND ADENOIDECTOMY     Social History  Substance Use Topics  . Smoking status: Former Smoker    Packs/day: 1.00    Years: 30.00  Types: Cigarettes    Quit date: 01/18/1992  . Smokeless tobacco: Never Used  . Alcohol use 7.2 oz/week    12 Glasses of wine per week     Comment: occ   Family History  Problem Relation Age of Onset  . Heart disease Mother   . Hypertension Mother   . Irritable bowel syndrome Mother   . Heart attack Father   . Hypertension Father   . Prostate cancer Father    Allergies  Allergen Reactions  . Iodine Hives    Contrast dye/ betadine ok   Current Outpatient Prescriptions on File Prior to Visit  Medication Sig Dispense Refill  . acetaminophen (TYLENOL) 325 MG tablet Take 325 mg by mouth every 6 (six) hours as needed for pain.     Marland Kitchen ALPRAZOLAM XR 0.5 MG 24 hr tablet TAKE 1 TABLET DAILY. 30 tablet 0  . atorvastatin (LIPITOR) 40 MG tablet Take 1 tablet (40 mg total) by mouth daily.  (Patient taking differently: Take 40 mg by mouth daily. am) 30 tablet 3  . fenofibrate (TRICOR) 145 MG tablet Take 1 tablet (145 mg total) by mouth daily. (Patient taking differently: Take 145 mg by mouth daily. am) 30 tablet 6  . fexofenadine (ALLEGRA) 180 MG tablet Take 180 mg by mouth daily. am    . fluticasone (FLONASE) 50 MCG/ACT nasal spray Place 2 sprays into both nostrils as needed. Reported on 07/08/2015    . losartan (COZAAR) 100 MG tablet Take 1 tablet (100 mg total) by mouth daily. 30 tablet 0  . meclizine (ANTIVERT) 25 MG tablet Take 25 mg by mouth 3 (three) times daily as needed for dizziness. Reported on 07/01/2015    . Multiple Vitamin (MULTIVITAMIN PO) Take 1 tablet by mouth daily. pm    . Omega-3 Fatty Acids (FISH OIL) 1000 MG CAPS Take 1 capsule by mouth daily. pm    . omeprazole (PRILOSEC) 40 MG capsule Take 1 capsule (40 mg total) by mouth daily. 30 capsule 3  . warfarin (COUMADIN) 5 MG tablet Take as directed by Anticoagulation clinic (Patient taking differently: Take as directed by Anticoagulation clinic/pm) 45 tablet 3  . ZETIA 10 MG tablet Take 1 tablet (10 mg total) by mouth daily. 30 tablet 1   No current facility-administered medications on file prior to visit.    The PMH, PSH, Social History, Family History, Medications, and allergies have been reviewed in [facility], and have been updated if relevant.  Review of Systems  See HPI   Review of Systems  Constitutional: Negative.   HENT: Negative.   Eyes: Negative.   Respiratory: Negative.   Cardiovascular: Negative.   Gastrointestinal: Negative.   Endocrine: Negative.   Genitourinary: Negative.   Musculoskeletal: Positive for arthralgias. Negative for joint swelling.  Skin: Negative.   Allergic/Immunologic: Negative.   Neurological: Negative.   Hematological: Negative.   Psychiatric/Behavioral: Negative.     Physical Exam  There were no vitals taken for this visit.  General:  overweght male in NAD Eyes:   PERRL Ears:  External ear exam shows no significant lesions or deformities.  Otoscopic examination reveals clear canals, tympanic membranes are intact bilaterally without bulging, retraction, inflammation or discharge. Hearing is grossly normal bilaterally. Nose:  External nasal examination shows no deformity or inflammation. Nasal mucosa are pink and moist without lesions or exudates. Mouth:  Oral mucosa and oropharynx without lesions or exudates.  Teeth in good repair. Neck:  no carotid bruit or thyromegaly no cervical or supraclavicular lymphadenopathy  Lungs:  Normal respiratory effort, chest expands symmetrically. Lungs are clear to auscultation, no crackles or wheezes. Heart:  Normal rate and regular rhythm. S1 and S2 normal without gallop, murmur, click, rub or other extra sounds. Pulses:  R and L posterior tibial pulses are full and equal bilaterally  Extremities:  no edema  Psych:  Good eye contact.  Not anxious or depressed appearing

## 2015-10-01 NOTE — Assessment & Plan Note (Signed)
Tolerating anticoagulation well.

## 2015-10-07 ENCOUNTER — Ambulatory Visit (INDEPENDENT_AMBULATORY_CARE_PROVIDER_SITE_OTHER): Payer: Medicare Other | Admitting: Cardiovascular Disease

## 2015-10-07 ENCOUNTER — Ambulatory Visit (INDEPENDENT_AMBULATORY_CARE_PROVIDER_SITE_OTHER): Payer: Medicare Other | Admitting: *Deleted

## 2015-10-07 ENCOUNTER — Encounter: Payer: Self-pay | Admitting: Cardiovascular Disease

## 2015-10-07 VITALS — BP 152/85 | HR 69 | Ht 70.5 in | Wt 250.0 lb

## 2015-10-07 DIAGNOSIS — I82409 Acute embolism and thrombosis of unspecified deep veins of unspecified lower extremity: Secondary | ICD-10-CM

## 2015-10-07 DIAGNOSIS — Z7901 Long term (current) use of anticoagulants: Secondary | ICD-10-CM | POA: Diagnosis not present

## 2015-10-07 DIAGNOSIS — E669 Obesity, unspecified: Secondary | ICD-10-CM

## 2015-10-07 DIAGNOSIS — Z5181 Encounter for therapeutic drug level monitoring: Secondary | ICD-10-CM

## 2015-10-07 DIAGNOSIS — I10 Essential (primary) hypertension: Secondary | ICD-10-CM

## 2015-10-07 DIAGNOSIS — E7800 Pure hypercholesterolemia, unspecified: Secondary | ICD-10-CM

## 2015-10-07 DIAGNOSIS — R600 Localized edema: Secondary | ICD-10-CM

## 2015-10-07 DIAGNOSIS — R944 Abnormal results of kidney function studies: Secondary | ICD-10-CM

## 2015-10-07 LAB — POCT INR: INR: 2.6

## 2015-10-07 MED ORDER — CLONIDINE HCL 0.1 MG PO TABS: 0.1000 mg | ORAL_TABLET | Freq: Two times a day (BID) | ORAL | 11 refills | Status: DC

## 2015-10-07 NOTE — Patient Instructions (Addendum)
Medication Instructions:  Monitor blood pressure for a week If elevated Please start clonidine one pill twice a day Monitor blood pressure   Goal <140 on the top Goal <90 on the bottom  Labwork:  No new labs needed  Testing/Procedures:  No further testing at this time   Follow-Up: It was a pleasure seeing you in the office today. Please call us if you have new issues that need to be addressed before your next appt.  [phone]  Your physician wants you to follow-up in: 6 months.  You will receive a reminder letter in the mail two months in advance. If you don't receive a letter, please call our office to schedule the follow-up appointment.  If you need a refill on your cardiac medications before your next appointment, please call your pharmacy.

## 2015-10-07 NOTE — Progress Notes (Signed)
Cardiology Office Note  Date:  10/07/2015   ID:  Anthony Oliveria., DOB [birth year], MRN [id number]  PCP:  Anthony Norris, MD   Chief Complaint  Patient presents with  . Other    6 month f/u.  BP has been running high.  Pt c/o constant tingling/numbness in b/l feet.     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   Prior CT scan of the chest from 2014   LAD and left circumflex coronary artery disease  EKG on today's visit shows normal sinus rhythm with rate 69 bpm, no significant ST or T-wave changes  Other past medical history  He wears compression hose given his history of DVT   PMH:   has a past medical history of Anxiety; Clotting disorder ([place]); Colon polyps; Cough; Depression; DVT (deep venous thrombosis) ([place]); GERD (gastroesophageal reflux disease); Hyperlipidemia; Hypertension; IBS (irritable bowel syndrome); Irregular heart beat; Palpitations; Pneumonia; Presence of partial dental prosthetic device; and Seasonal allergies.  PSH:    Past Surgical History:  Procedure  Laterality Date  . APPENDECTOMY     1967  . CATARACT EXTRACTION W/PHACO Right 07/08/2015   Procedure: CATARACT EXTRACTION PHACO AND INTRAOCULAR LENS PLACEMENT ([place]) RIGHT EYE;  Surgeon: Anthony Koyanagi, MD;  Location: [facility];  Service: Ophthalmology;  Laterality: Right;   SYMFONY LENS  . CATARACT EXTRACTION W/PHACO Left 08/05/2015   Procedure: CATARACT EXTRACTION PHACO AND INTRAOCULAR LENS PLACEMENT ([place]) left eye;  Surgeon: Anthony Koyanagi, MD;  Location: [facility];  Service: Ophthalmology;  Laterality: Left;  SYMFONY LENS  . CATARACT EXTRACTION, BILATERAL Bilateral 08/2015  . COLONOSCOPY     x3  . POLYPECTOMY    . TONSILLECTOMY AND ADENOIDECTOMY      Current Outpatient Prescriptions  Medication Sig Dispense Refill  . acetaminophen (TYLENOL) 325 MG tablet Take 325 mg by mouth every 6 (six) hours as needed for pain.     Marland Kitchen ALPRAZOLAM XR 0.5 MG 24 hr tablet TAKE 1 TABLET DAILY. 30 tablet 0  . atorvastatin (LIPITOR) 40 MG tablet Take 1 tablet (40 mg total) by mouth daily. (Patient taking differently: Take 40 mg by mouth daily. am) 30 tablet 3  . buPROPion (WELLBUTRIN XL) 150 MG 24 hr tablet Take 1 tablet (150 mg total) by mouth daily. 90 tablet 2  . fenofibrate (TRICOR) 145 MG tablet Take 1 tablet (145 mg total) by mouth daily. (Patient taking differently: Take 145 mg by mouth daily. am) 30 tablet 6  . fexofenadine (ALLEGRA) 180 MG tablet Take 180 mg by mouth daily. am    . fluticasone (FLONASE) 50 MCG/ACT nasal spray Place 2 sprays into both nostrils as  needed. Reported on 07/08/2015    . losartan (COZAAR) 100 MG tablet Take 1 tablet (100 mg total) by mouth daily. 30 tablet 0  . Multiple Vitamin (MULTIVITAMIN PO) Take 1 tablet by mouth daily. pm    . Omega-3 Fatty Acids (FISH OIL) 1000 MG CAPS Take 1 capsule by mouth daily. pm    . omeprazole (PRILOSEC) 40 MG capsule Take 1 capsule (40 mg total) by mouth daily. 30 capsule 3  . warfarin (COUMADIN) 5 MG tablet Take  as directed by Anticoagulation clinic (Patient taking differently: Take as directed by Anticoagulation clinic/pm) 45 tablet 3  . ZETIA 10 MG tablet Take 1 tablet (10 mg total) by mouth daily. 30 tablet 1  . cloNIDine (CATAPRES) 0.1 MG tablet Take 1 tablet (0.1 mg total) by mouth 2 (two) times daily. 60 tablet 11  . meclizine (ANTIVERT) 25 MG tablet Take 25 mg by mouth 3 (three) times daily as needed for dizziness. Reported on 07/01/2015     No current facility-administered medications for this visit.      Allergies:   Iodine   Social History:  The patient  reports that he quit smoking about 23 years ago. His smoking use included Cigarettes. He has a 30.00 pack-year smoking history. He has never used smokeless tobacco. He reports that he drinks about 7.2 oz of alcohol per week . He reports that he does not use drugs.   Family History:   family history includes Heart attack in his father; Heart disease in his mother; Hypertension in his father and mother; Irritable bowel syndrome in his mother; Prostate cancer in his father.    Review of Systems: Review of Systems  Constitutional: Negative.   Respiratory: Negative.   Cardiovascular: Positive for leg swelling.  Gastrointestinal: Negative.   Musculoskeletal: Negative.   Neurological: Negative.   Psychiatric/Behavioral: Negative.   All other systems reviewed and are negative.    PHYSICAL EXAM: VS:  BP (!) 152/85   Pulse 69   Ht 5' 10.5" (1.791 m)   Wt 250 lb (113.4 kg)   BMI 35.36 kg/m  , BMI Body mass index is 35.36 kg/m.  Repeat blood pressure 123456 systolic GEN: Well nourished, well developed, in no acute distress, obese  HEENT: normal  Neck: no JVD, carotid bruits, or masses Cardiac: RRR; no murmurs, rubs, or gallops, trace non-pitting edema bilaterally with compression hose in place Respiratory:  clear to auscultation bilaterally, normal work of breathing GI: soft, nontender, nondistended, + BS MS: no deformity or atrophy   Skin: warm and dry, no rash Neuro:  Strength and sensation are intact Psych: euthymic mood, full affect    Recent Labs: 09/29/2015: ALT 19; BUN 24; Creatinine, Ser 1.35; Hemoglobin 14.4; Platelets 170.0; Potassium 4.6; Sodium 144    Lipid Panel Lab Results  Component Value Date   CHOL 149 09/29/2015   HDL 43.90 09/29/2015   LDLCALC 83 09/29/2015   TRIG 112.0 09/29/2015      Wt Readings from Last 3 Encounters:  10/07/15 250 lb (113.4 kg)  10/01/15 253 lb (114.8 kg)  09/29/15 250 lb 4 oz (113.5 kg)    Up 3 pounds in one year   ASSESSMENT AND PLAN:   Essential hypertension - Plan: EKG 12-Lead Blood pressure elevated even on recheck, Discussed various treatment options with him. We will avoid calcium channel blockers given chronic lower extremity swelling,  We'll avoid diuretics given renal dysfunction We'll avoid beta blockers given borderline low blood pressure  We have recommended  he start clonidine 0.1 mill grams twice a day if blood pressure continues to run high  Other options include isosorbide, Cardura, lastly hydralazine   HYPERCHOLESTEROLEMIA Cholesterol is at goal on the current lipid regimen. No changes to the medications were made.  Localized edema Chronic lower extremity edema, stable  Wears compression hose, we'll avoid calcium channel blockers   Obesity We have encouraged continued exercise, careful diet management in an effort to lose weight.  Decreased GFR Recommended he stay hydrated We will avoid diuretics   Total encounter time more than 25 minutes  Greater than 50% was spent in counseling and coordination of care with the patient   Disposition:   F/U  6 months   Orders Placed This Encounter  Procedures  . EKG 12-Lead     Signed, Esmond Plants, M.D., Ph.D. 10/07/2015  [place], [place]

## 2015-10-15 ENCOUNTER — Telehealth: Payer: Self-pay | Admitting: Cardiovascular Disease

## 2015-10-15 NOTE — Telephone Encounter (Signed)
Pt was asked at [facility] 10/07/15 to call back w/ BP readings.

## 2015-10-15 NOTE — Telephone Encounter (Signed)
Pt is calling back with BP readings: 9/21-138/78 HR 73 9/22-126/81 HR 73 9/23-144/91 HR 74 9/24-142/100 a.m. HR 78, 2 pm 136/82 HR 65, 11pm 141/80 HR 68 9/25 158/95 HR 76, 6:45 pm 142/86 HR 64 9/26-157/89 HR 72, 9 pm 150/80 HR 73 9/27-10 am 151/92 HR 66, 10:50 pm 134/77 HR 71 9/28-158/92 HR 73 Pt will be available before 3 after 4.

## 2015-10-18 NOTE — Telephone Encounter (Signed)
What meds is he taking? New clonidine?

## 2015-10-19 NOTE — Telephone Encounter (Signed)
Spoke w/ pt's wife.  Advised her of Dr. Donivan Scull recommendation.  She verbalizes understanding and will have pt call back if he has any questions or concerns.

## 2015-10-19 NOTE — Telephone Encounter (Signed)
Spoke w/ pt.  He reports that he in only taking clondine 0.1 mg once daily. He has not increased to BID, as he hopes that he is increasing his walking will help improve his readings. He currently takes dose in the am. He has not increasedhis dose, as he "wanted to make sure it was ok w/ Dr. Rockey Situ". If he is to remain on once daily dosing, he would like to know if Dr. Rockey Situ prefers he take in the am or pm.  Advised pt that his readings have been high and he should increase to BID, per his last AVS, but he would like to make sure Dr. Rockey Situ is ok w/ this.

## 2015-10-19 NOTE — Telephone Encounter (Signed)
Clonidine typically taken twice a day When taken 1 today, can get rebound hypertension

## 2015-10-28 ENCOUNTER — Other Ambulatory Visit: Payer: Self-pay | Admitting: Cardiovascular Disease

## 2015-10-28 ENCOUNTER — Other Ambulatory Visit: Payer: Self-pay | Admitting: Family Medicine

## 2015-10-28 NOTE — Telephone Encounter (Signed)
Last f/u 09/2015

## 2015-10-29 NOTE — Telephone Encounter (Signed)
Rx called in to requested pharmacy 

## 2015-11-04 ENCOUNTER — Ambulatory Visit (INDEPENDENT_AMBULATORY_CARE_PROVIDER_SITE_OTHER): Payer: Medicare Other | Admitting: *Deleted

## 2015-11-04 DIAGNOSIS — Z7901 Long term (current) use of anticoagulants: Secondary | ICD-10-CM | POA: Diagnosis not present

## 2015-11-04 DIAGNOSIS — Z5181 Encounter for therapeutic drug level monitoring: Secondary | ICD-10-CM | POA: Diagnosis not present

## 2015-11-04 DIAGNOSIS — I82409 Acute embolism and thrombosis of unspecified deep veins of unspecified lower extremity: Secondary | ICD-10-CM

## 2015-11-04 LAB — POCT INR: INR: 2.8

## 2015-11-12 ENCOUNTER — Encounter: Payer: Self-pay | Admitting: Family Medicine

## 2015-11-12 ENCOUNTER — Ambulatory Visit (INDEPENDENT_AMBULATORY_CARE_PROVIDER_SITE_OTHER): Payer: Medicare Other | Admitting: Family Medicine

## 2015-11-12 VITALS — BP 124/76 | HR 48 | Temp 97.4°F | Ht 70.5 in | Wt 253.8 lb

## 2015-11-12 DIAGNOSIS — M79641 Pain in right hand: Secondary | ICD-10-CM

## 2015-11-12 DIAGNOSIS — H43812 Vitreous degeneration, left eye: Secondary | ICD-10-CM | POA: Diagnosis not present

## 2015-11-12 NOTE — Progress Notes (Signed)
Dr. Frederico Hamman T. Rosser Col[place], MD, [facility] [place], [address] Phone: [phone] Fax: [phone]  11/12/2015  Patient: Anthony Liljedahl., MRN: [id number], DOB: [birth year], 70 y.o.  Primary Physician:  Arnette Norris, MD   Chief Complaint  Patient presents with  . Hand Pain    Hit hand on metal last Saturday   Subjective:   Anthony Sabharwal. is a 71 y.o. very pleasant male patient who presents with the following:  Right dorsum of the hand.  On the dorsum of the hand there is quite a bit of swelling, and the patient it is very and on a piece of metal last Saturday. He has got some relatively extensive bruising on his forearm, and the patient is on Coumadin. He has a significant elevation on the dorsum of the hand itself.  No pain in the wrist, radius, ulna, and he is using his hand without any difficulty.  Immunization History  Administered Date(s) Administered  . Influenza Split 10/31/2011  . Influenza Whole 10/30/2007, 10/28/2008, 10/27/2009  . Influenza,inj,Quad PF,36+ Mos 11/13/2013, 10/14/2014, 10/01/2015  . Pneumococcal Conjugate-13 11/13/2013  . Pneumococcal Polysaccharide-23 03/23/2011  . Td 08/03/2000, 09/16/2008  . Zoster 10/28/2008     Past Medical History, Surgical History, Social History, Family History, Problem List, Medications, and Allergies have been reviewed and updated if relevant.  Patient Active Problem List   Diagnosis Date Noted  . Obesity 09/09/2014  . Decreased GFR 06/23/2014  . Encounter for therapeutic drug monitoring 03/20/2013  . Sleep apnea 09/05/2012  . Allergic rhinitis 03/22/2012  . Anxiety 02/04/2011  . Deep vein thrombosis (DVT) ([place]) 04/06/2010  . Long term current use of anticoagulant 04/06/2010  . History of DVT of lower extremity 07/18/2008  . Edema 06/26/2008  . SKIN CANCER, HX OF 10/30/2007  . ERECTILE DYSFUNCTION 04/30/2007  . COLONIC POLYPS 2/08 07/12/2006  .  ARRHYTHMIA, HX OF 6/98 07/12/2006  . HYPERCHOLESTEROLEMIA 04/20/2006  . DEPRESSION 04/20/2006  . Essential hypertension 04/20/2006    Past Medical History:  Diagnosis Date  . Anxiety   . Clotting disorder ([place])   . Colon polyps    2008 Tubular Adenoma  . Cough    chronic  . Depression   . DVT (deep venous thrombosis) ([facility])    hx of blood clot in right leg 8 yrs ago  . GERD (gastroesophageal reflux disease)   . Hyperlipidemia   . Hypertension    controlled on meds  . IBS (irritable bowel syndrome)   . Irregular heart beat   . Palpitations    arryhmia/Dr Marcello Moores Wall/ Dr Gollam/ [facility]  . Pneumonia    3 wks ago/ no hospitalization/ no follow up required/ was on antibiotic and steroids/Dr Edilia Bo  . Presence of partial dental prosthetic device    lower right  . Seasonal allergies     Past Surgical History:  Procedure Laterality Date  . APPENDECTOMY     1967  . CATARACT EXTRACTION W/PHACO Right 07/08/2015   Procedure: CATARACT EXTRACTION PHACO AND INTRAOCULAR LENS PLACEMENT ([place]) RIGHT EYE;  Surgeon: Leandrew Koyanagi, MD;  Location: [facility];  Service: Ophthalmology;  Laterality: Right;   SYMFONY LENS  . CATARACT EXTRACTION W/PHACO Left 08/05/2015   Procedure: CATARACT EXTRACTION PHACO AND INTRAOCULAR LENS PLACEMENT ([place]) left eye;  Surgeon: Leandrew Koyanagi, MD;  Location: [facility];  Service: Ophthalmology;  Laterality: Left;  SYMFONY LENS  . CATARACT EXTRACTION, BILATERAL Bilateral 08/2015  .  COLONOSCOPY     x3  . POLYPECTOMY    . TONSILLECTOMY AND ADENOIDECTOMY      Social History   Social History  . Marital status: Married    Spouse name: N/A  . Number of children: 1  . Years of education: N/A   Occupational History  . Sales [employer]   Social History Main Topics  . Smoking status: Former Smoker    Packs/day: 1.00    Years: 30.00    Types: Cigarettes    Quit date: 01/18/1992  . Smokeless tobacco: Never Used  .  Alcohol use 7.2 oz/week    12 Glasses of wine per week     Comment: occ  . Drug use: No  . Sexual activity: Yes   Other Topics Concern  . Not on file   Social History Narrative   Has living will.   Desires CPR.   Would not want life support if recovery futile.  Unsure about feeding tube.    Family History  Problem Relation Age of Onset  . Heart disease Mother   . Hypertension Mother   . Irritable bowel syndrome Mother   . Heart attack Father   . Hypertension Father   . Prostate cancer Father     Allergies  Allergen Reactions  . Iodine Hives    Contrast dye/ betadine ok    Medication list reviewed and updated in full in [place].  GEN: No fevers, chills. Nontoxic. Primarily MSK c/o today. MSK: Detailed in the HPI GI: tolerating PO intake without difficulty Neuro: No numbness, parasthesias, or tingling associated. Otherwise the pertinent positives of the ROS are noted above.   Objective:   BP 124/76   Pulse (!) 48   Temp 97.4 F (36.3 C) (Oral)   Ht 5' 10.5" (1.791 m)   Wt 253 lb 12 oz (115.1 kg)   BMI 35.89 kg/m    GEN: WDWN, NAD, Non-toxic, Alert & Oriented x 3 HEENT: Atraumatic, Normocephalic.  Ears and Nose: No external deformity. EXTR: No clubbing/cyanosis/edema NEURO: Normal gait.  PSYCH: Normally interactive. Conversant. Not depressed or anxious appearing.  Calm demeanor.    All bony anatomy is nontender. This is of the hand and wrist as well as the forearm. Full composite fist. Axial loading is negative.  There is extensive ecchymosis on the forearm, and there is some dorsal swelling on the hand.  Radiology: No results found.  Assessment and Plan:   Right hand pain  >25 minutes spent in face to face time with patient, >50% spent in counselling or coordination of care   I felt like the patient had a hematoma on the dorsum of his hand, therefore we attempted an aspiration with an 18-gauge needle. The patient was consented, and prepped  with ChloraPrep. Ethyl chloride was used for anesthesia. 3 different passes were made at 3 locations adjacent to the swollen area on the dorsum of the wrist, yielding minimal to no blood. No complications. A compression wrap was used after.  Swelling, not hematoma. Recommended compression and icing. Reassured. Examination not consistent with fracture.  Follow-up: No Follow-up on file.  Signed,  Maud Deed. Zakaiya Lares, MD   Patient's Medications  New Prescriptions   No medications on file  Previous Medications   ACETAMINOPHEN (TYLENOL) 325 MG TABLET    Take 325 mg by mouth every 6 (six) hours as needed for pain.    ALPRAZOLAM XR 0.5 MG 24 HR TABLET    TAKE 1 TABLET DAILY.  ATORVASTATIN (LIPITOR) 40 MG TABLET    Take 1 tablet (40 mg total) by mouth daily.   BUPROPION (WELLBUTRIN XL) 150 MG 24 HR TABLET    Take 1 tablet (150 mg total) by mouth daily.   CLONIDINE (CATAPRES) 0.1 MG TABLET    Take 1 tablet (0.1 mg total) by mouth 2 (two) times daily.   EZETIMIBE (ZETIA) 10 MG TABLET    Take 1 tablet (10 mg total) by mouth daily.   FENOFIBRATE (TRICOR) 145 MG TABLET    Take 1 tablet (145 mg total) by mouth daily.   FEXOFENADINE (ALLEGRA) 180 MG TABLET    Take 180 mg by mouth daily. am   FLUTICASONE (FLONASE) 50 MCG/ACT NASAL SPRAY    Place 2 sprays into both nostrils as needed. Reported on 07/08/2015   LOSARTAN (COZAAR) 100 MG TABLET    Take 1 tablet (100 mg total) by mouth daily.   MECLIZINE (ANTIVERT) 25 MG TABLET    Take 25 mg by mouth 3 (three) times daily as needed for dizziness. Reported on 07/01/2015   MULTIPLE VITAMIN (MULTIVITAMIN PO)    Take 1 tablet by mouth daily. pm   OMEGA-3 FATTY ACIDS (FISH OIL) 1000 MG CAPS    Take 1 capsule by mouth daily. pm   OMEPRAZOLE (PRILOSEC) 40 MG CAPSULE    Take 1 capsule (40 mg total) by mouth daily.   WARFARIN (COUMADIN) 5 MG TABLET    Take as directed by Anticoagulation clinic  Modified Medications   No medications on file  Discontinued Medications     No medications on file

## 2015-11-12 NOTE — Progress Notes (Signed)
Pre visit review using our clinic review tool, if applicable. No additional management support is needed unless otherwise documented below in the visit note. 

## 2015-11-26 ENCOUNTER — Other Ambulatory Visit: Payer: Self-pay | Admitting: Family Medicine

## 2015-11-26 ENCOUNTER — Other Ambulatory Visit: Payer: Self-pay | Admitting: Cardiovascular Disease

## 2015-11-26 MED ORDER — ATORVASTATIN CALCIUM 40 MG PO TABS: 40.0000 mg | ORAL_TABLET | Freq: Every day | ORAL | 3 refills | Status: DC

## 2015-11-26 MED ORDER — OMEPRAZOLE 40 MG PO CPDR: DELAYED_RELEASE_CAPSULE | ORAL | 3 refills | Status: DC

## 2015-11-26 MED ORDER — LOSARTAN POTASSIUM 100 MG PO TABS: ORAL_TABLET | ORAL | 3 refills | Status: DC

## 2015-11-26 NOTE — Telephone Encounter (Signed)
rx called in to requested pharmacy 

## 2015-11-26 NOTE — Telephone Encounter (Signed)
Last f/u 09/2015

## 2015-12-02 ENCOUNTER — Ambulatory Visit (INDEPENDENT_AMBULATORY_CARE_PROVIDER_SITE_OTHER): Payer: Medicare Other

## 2015-12-02 DIAGNOSIS — I82409 Acute embolism and thrombosis of unspecified deep veins of unspecified lower extremity: Secondary | ICD-10-CM | POA: Diagnosis not present

## 2015-12-02 DIAGNOSIS — Z7901 Long term (current) use of anticoagulants: Secondary | ICD-10-CM | POA: Diagnosis not present

## 2015-12-02 DIAGNOSIS — Z5181 Encounter for therapeutic drug level monitoring: Secondary | ICD-10-CM | POA: Diagnosis not present

## 2015-12-02 LAB — POCT INR: INR: 3

## 2015-12-23 ENCOUNTER — Other Ambulatory Visit: Payer: Self-pay | Admitting: Family Medicine

## 2015-12-23 NOTE — Telephone Encounter (Signed)
Last refill 11/26/15, last OV 10/01/15. Ok to refill?

## 2015-12-23 NOTE — Telephone Encounter (Signed)
CALLED into  [place], [place]: [phone]

## 2015-12-30 ENCOUNTER — Ambulatory Visit (INDEPENDENT_AMBULATORY_CARE_PROVIDER_SITE_OTHER): Payer: Medicare Other

## 2015-12-30 DIAGNOSIS — Z5181 Encounter for therapeutic drug level monitoring: Secondary | ICD-10-CM | POA: Diagnosis not present

## 2015-12-30 DIAGNOSIS — I82409 Acute embolism and thrombosis of unspecified deep veins of unspecified lower extremity: Secondary | ICD-10-CM

## 2015-12-30 DIAGNOSIS — Z7901 Long term (current) use of anticoagulants: Secondary | ICD-10-CM | POA: Diagnosis not present

## 2015-12-30 LAB — POCT INR: INR: 3.2

## 2016-01-06 ENCOUNTER — Other Ambulatory Visit: Payer: Self-pay | Admitting: Cardiovascular Disease

## 2016-01-06 NOTE — Telephone Encounter (Signed)
Please review for refill. Thanks!  

## 2016-01-19 ENCOUNTER — Other Ambulatory Visit: Payer: Self-pay | Admitting: Family Medicine

## 2016-01-20 NOTE — Telephone Encounter (Signed)
Last f/u 09/2015

## 2016-01-21 ENCOUNTER — Other Ambulatory Visit: Payer: Self-pay | Admitting: Family Medicine

## 2016-01-21 NOTE — Telephone Encounter (Signed)
Rx called in to requested pharmacy 

## 2016-01-27 ENCOUNTER — Ambulatory Visit (INDEPENDENT_AMBULATORY_CARE_PROVIDER_SITE_OTHER): Payer: Medicare Other

## 2016-01-27 DIAGNOSIS — I82409 Acute embolism and thrombosis of unspecified deep veins of unspecified lower extremity: Secondary | ICD-10-CM | POA: Diagnosis not present

## 2016-01-27 DIAGNOSIS — Z5181 Encounter for therapeutic drug level monitoring: Secondary | ICD-10-CM

## 2016-01-27 DIAGNOSIS — Z7901 Long term (current) use of anticoagulants: Secondary | ICD-10-CM

## 2016-01-27 LAB — POCT INR: INR: 2.4

## 2016-02-11 ENCOUNTER — Other Ambulatory Visit: Payer: Self-pay | Admitting: Family Medicine

## 2016-02-15 NOTE — Telephone Encounter (Signed)
Last f/u 09/2015

## 2016-02-16 NOTE — Telephone Encounter (Signed)
rx called in to requested pharmacy 

## 2016-02-19 ENCOUNTER — Encounter: Payer: Self-pay | Admitting: Gastroenterology

## 2016-02-24 ENCOUNTER — Ambulatory Visit (INDEPENDENT_AMBULATORY_CARE_PROVIDER_SITE_OTHER): Payer: Medicare Other

## 2016-02-24 DIAGNOSIS — Z7901 Long term (current) use of anticoagulants: Secondary | ICD-10-CM | POA: Diagnosis not present

## 2016-02-24 DIAGNOSIS — Z5181 Encounter for therapeutic drug level monitoring: Secondary | ICD-10-CM | POA: Diagnosis not present

## 2016-02-24 DIAGNOSIS — I82409 Acute embolism and thrombosis of unspecified deep veins of unspecified lower extremity: Secondary | ICD-10-CM | POA: Diagnosis not present

## 2016-02-24 LAB — POCT INR: INR: 3.9

## 2016-03-14 ENCOUNTER — Other Ambulatory Visit: Payer: Self-pay | Admitting: Family Medicine

## 2016-03-14 NOTE — Telephone Encounter (Signed)
Verbal refill given to Anthony Kim at the pharmacy

## 2016-03-14 NOTE — Telephone Encounter (Signed)
Alprazolam last filled 02-16-16 #30 Last OV 09-28-16 No Future OV

## 2016-03-16 ENCOUNTER — Ambulatory Visit (INDEPENDENT_AMBULATORY_CARE_PROVIDER_SITE_OTHER): Payer: Medicare Other

## 2016-03-16 DIAGNOSIS — Z7901 Long term (current) use of anticoagulants: Secondary | ICD-10-CM

## 2016-03-16 DIAGNOSIS — I82409 Acute embolism and thrombosis of unspecified deep veins of unspecified lower extremity: Secondary | ICD-10-CM | POA: Diagnosis not present

## 2016-03-16 DIAGNOSIS — Z5181 Encounter for therapeutic drug level monitoring: Secondary | ICD-10-CM

## 2016-03-16 LAB — POCT INR: INR: 3.5

## 2016-04-06 ENCOUNTER — Ambulatory Visit (INDEPENDENT_AMBULATORY_CARE_PROVIDER_SITE_OTHER): Payer: Medicare Other

## 2016-04-06 DIAGNOSIS — I82409 Acute embolism and thrombosis of unspecified deep veins of unspecified lower extremity: Secondary | ICD-10-CM

## 2016-04-06 DIAGNOSIS — Z5181 Encounter for therapeutic drug level monitoring: Secondary | ICD-10-CM

## 2016-04-06 DIAGNOSIS — Z7901 Long term (current) use of anticoagulants: Secondary | ICD-10-CM

## 2016-04-06 LAB — POCT INR: INR: 2.5

## 2016-04-13 ENCOUNTER — Other Ambulatory Visit: Payer: Self-pay | Admitting: Family Medicine

## 2016-04-13 ENCOUNTER — Other Ambulatory Visit: Payer: Self-pay | Admitting: Cardiovascular Disease

## 2016-04-13 ENCOUNTER — Other Ambulatory Visit: Payer: Self-pay

## 2016-04-13 MED ORDER — OMEPRAZOLE 40 MG PO CPDR: DELAYED_RELEASE_CAPSULE | ORAL | 3 refills | Status: DC

## 2016-04-13 MED ORDER — ATORVASTATIN CALCIUM 40 MG PO TABS: 40.0000 mg | ORAL_TABLET | Freq: Every day | ORAL | 3 refills | Status: DC

## 2016-04-13 NOTE — Telephone Encounter (Signed)
Requested Prescriptions   Signed Prescriptions Disp Refills  . omeprazole (PRILOSEC) 40 MG capsule 30 capsule 3    Sig: Take 1 capsule (40 mg total) by mouth daily.    Authorizing Provider: Minna Merritts    Ordering User: Janan Ridge atorvastatin (LIPITOR) 40 MG tablet 30 tablet 3    Sig: Take 1 tablet (40 mg total) by mouth daily.    Authorizing Provider: Minna Merritts    Ordering User: Janan Ridge

## 2016-04-13 NOTE — Telephone Encounter (Signed)
Called Rx to the pharmacy 

## 2016-04-13 NOTE — Telephone Encounter (Signed)
Pt last seen 10/01/2015, last refill 03/14/2016.Marland KitchenMarland Kitchenplease advise

## 2016-05-01 ENCOUNTER — Other Ambulatory Visit: Payer: Self-pay | Admitting: Cardiovascular Disease

## 2016-05-04 ENCOUNTER — Ambulatory Visit (INDEPENDENT_AMBULATORY_CARE_PROVIDER_SITE_OTHER): Payer: Medicare Other

## 2016-05-04 DIAGNOSIS — Z5181 Encounter for therapeutic drug level monitoring: Secondary | ICD-10-CM | POA: Diagnosis not present

## 2016-05-04 DIAGNOSIS — Z7901 Long term (current) use of anticoagulants: Secondary | ICD-10-CM

## 2016-05-04 DIAGNOSIS — I82409 Acute embolism and thrombosis of unspecified deep veins of unspecified lower extremity: Secondary | ICD-10-CM

## 2016-05-04 LAB — POCT INR: INR: 2.1

## 2016-05-23 DIAGNOSIS — H43813 Vitreous degeneration, bilateral: Secondary | ICD-10-CM | POA: Diagnosis not present

## 2016-05-24 ENCOUNTER — Other Ambulatory Visit: Payer: Self-pay | Admitting: Cardiovascular Disease

## 2016-05-24 ENCOUNTER — Other Ambulatory Visit: Payer: Self-pay | Admitting: Family Medicine

## 2016-05-24 NOTE — Telephone Encounter (Signed)
Last filled 04-13-16 #30 Last OV 10-01-15 No Future OV

## 2016-05-24 NOTE — Telephone Encounter (Signed)
Verbal refill given to [place] at the pharmacy

## 2016-05-26 DIAGNOSIS — R350 Frequency of micturition: Secondary | ICD-10-CM | POA: Diagnosis not present

## 2016-05-26 DIAGNOSIS — N5201 Erectile dysfunction due to arterial insufficiency: Secondary | ICD-10-CM | POA: Diagnosis not present

## 2016-05-26 DIAGNOSIS — N400 Enlarged prostate without lower urinary tract symptoms: Secondary | ICD-10-CM | POA: Diagnosis not present

## 2016-05-27 ENCOUNTER — Other Ambulatory Visit: Payer: Self-pay

## 2016-05-27 MED ORDER — EZETIMIBE 10 MG PO TABS: ORAL_TABLET | ORAL | 1 refills | Status: DC

## 2016-06-01 ENCOUNTER — Ambulatory Visit (INDEPENDENT_AMBULATORY_CARE_PROVIDER_SITE_OTHER): Payer: Medicare Other | Admitting: *Deleted

## 2016-06-01 DIAGNOSIS — Z5181 Encounter for therapeutic drug level monitoring: Secondary | ICD-10-CM | POA: Diagnosis not present

## 2016-06-01 DIAGNOSIS — Z7901 Long term (current) use of anticoagulants: Secondary | ICD-10-CM | POA: Diagnosis not present

## 2016-06-01 DIAGNOSIS — I82409 Acute embolism and thrombosis of unspecified deep veins of unspecified lower extremity: Secondary | ICD-10-CM

## 2016-06-01 LAB — POCT INR: INR: 3

## 2016-06-17 ENCOUNTER — Other Ambulatory Visit: Payer: Self-pay | Admitting: Family Medicine

## 2016-06-17 ENCOUNTER — Other Ambulatory Visit: Payer: Self-pay | Admitting: Cardiovascular Disease

## 2016-06-20 NOTE — Telephone Encounter (Signed)
Last refill 05/24/16 #30, last ov 10/01/15

## 2016-06-27 DIAGNOSIS — N400 Enlarged prostate without lower urinary tract symptoms: Secondary | ICD-10-CM | POA: Diagnosis not present

## 2016-06-27 NOTE — Progress Notes (Signed)
Cardiology Office Note  Date:  06/28/2016   ID:  Anthony Kim., DOB 04-25-1945, MRN [id number]  PCP:  Anthony Passy, MD   Chief Complaint  Patient presents with  . other    6 month f/u no complaints today. Meds reviewed verbally with pt.    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   EKG on today's visit showing atrial fibrillation with ventricular rate 95 bpm no significant ST or T-wave changes  Prior CT scan of the chest from 2014   LAD and left circumflex coronary artery disease  Other past medical history  He wears compression hose given his history of DVT   PMH:   has a past medical history of Anxiety; Clotting disorder ([place]); Colon polyps; Cough; Depression; DVT (deep venous thrombosis) ([place]); GERD (gastroesophageal reflux disease); Hyperlipidemia; Hypertension; IBS (irritable bowel syndrome); Irregular heart beat; Palpitations; Pneumonia; Presence of partial dental prosthetic device; and Seasonal allergies.  PSH:    Past Surgical History:  Procedure Laterality Date  . APPENDECTOMY     1967  . CATARACT EXTRACTION W/PHACO Right 07/08/2015   Procedure: CATARACT EXTRACTION PHACO AND INTRAOCULAR LENS PLACEMENT ([place]) RIGHT EYE;  Surgeon: Leandrew Koyanagi, MD;  Location: [facility];  Service: Ophthalmology;  Laterality: Right;   SYMFONY LENS  . CATARACT EXTRACTION W/PHACO Left 08/05/2015   Procedure: CATARACT EXTRACTION PHACO AND INTRAOCULAR LENS PLACEMENT ([place]) left eye;  Surgeon: Leandrew Koyanagi, MD;  Location: [facility];  Service: Ophthalmology;  Laterality: Left;  SYMFONY LENS  . CATARACT EXTRACTION, BILATERAL Bilateral 08/2015  . COLONOSCOPY     x3  . POLYPECTOMY    . TONSILLECTOMY AND ADENOIDECTOMY      Current Outpatient Prescriptions  Medication Sig Dispense Refill  . acetaminophen (TYLENOL) 325 MG tablet Take 325 mg by mouth every 6 (six) hours as needed for pain.     Marland Kitchen ALPRAZOLAM XR 0.5 MG 24 hr tablet TAKE 1 TABLET DAILY. 30 tablet 0  . atorvastatin (LIPITOR) 40 MG tablet Take 1 tablet (40 mg total) by mouth daily. 30 tablet 3  . buPROPion (WELLBUTRIN XL) 150 MG 24 hr tablet Take 1 tablet (150 mg total) by mouth daily. 90 tablet 2  . cloNIDine (CATAPRES) 0.1 MG tablet Take 1 tablet (0.1 mg total) by mouth 2 (two) times daily. 60 tablet 11  . ezetimibe (ZETIA) 10 MG tablet Take 1 tablet (10 mg total) by mouth daily. 30 tablet 1  . fenofibrate (TRICOR) 145 MG tablet Take 1 tablet (145 mg total) by mouth daily. 30 tablet 11  . fexofenadine (ALLEGRA) 180 MG tablet Take 180 mg by mouth daily. am    . fluticasone (FLONASE) 50 MCG/ACT nasal spray Place 2 sprays into both nostrils as needed. Reported on 07/08/2015    . losartan (  COZAAR) 100 MG tablet Take 1 tablet (100 mg total) by mouth daily. 30 tablet 0  . meclizine (ANTIVERT) 25 MG tablet Take 25 mg by mouth 3 (three) times daily as needed for dizziness. Reported on 07/01/2015    . Multiple Vitamin (MULTIVITAMIN PO) Take 1 tablet by mouth daily. pm    . Omega-3 Fatty Acids (FISH OIL) 1000 MG CAPS Take 1 capsule by mouth daily. pm    . omeprazole (PRILOSEC) 40 MG capsule Take 1 capsule (40 mg total) by mouth daily. 30 capsule 3  . warfarin (COUMADIN) 5 MG  tablet Take as directed by Anticoagulation clinic 45 tablet 2   No current facility-administered medications for this visit.      Allergies:   Iodine   Social History:  The patient  reports that he quit smoking about 24 years ago. His smoking use included Cigarettes. He has a 30.00 pack-year smoking history. He has never used smokeless tobacco. He reports that he drinks about 7.2 oz of alcohol per week . He reports that he does not use drugs.   Family History:   family history includes Heart attack in his father; Heart disease in his mother; Hypertension in his father and mother; Irritable bowel syndrome in his mother; Prostate cancer in his father.    Review of Systems: Review of Systems  Constitutional: Negative.   Respiratory: Negative.   Cardiovascular: Positive for leg swelling.  Gastrointestinal: Negative.   Musculoskeletal: Negative.   Neurological: Negative.   Psychiatric/Behavioral: Negative.   All other systems reviewed and are negative.    PHYSICAL EXAM: VS:  BP 90/70 (BP Location: Left Arm, Patient Position: Sitting, Cuff Size: Large)   Pulse 95   Ht 5\' 11"  (1.803 m)   Wt 249 lb (112.9 kg)   BMI 34.73 kg/m  , BMI Body mass index is 34.73 kg/m.  Repeat blood pressure 494W systolic GEN: Well nourished, well developed, in no acute distress, obese  HEENT: normal  Neck: no JVD, carotid bruits, or masses Cardiac: Irregularly irregular, no murmurs, rubs, or gallops, trace non-pitting edema bilaterally with compression hose in place Respiratory:  clear to auscultation bilaterally, normal work of breathing GI: soft, nontender, nondistended, + BS MS: no deformity or atrophy  Skin: warm and dry, no rash Neuro:  Strength and sensation are intact Psych: euthymic mood, full affect    Recent Labs: 09/29/2015: ALT 19; BUN 24; Creatinine, Ser 1.35; Hemoglobin 14.4; Platelets 170.0; Potassium 4.6; Sodium 144    Lipid Panel Lab Results  Component Value Date   CHOL 149  09/29/2015   HDL 43.90 09/29/2015   LDLCALC 83 09/29/2015   TRIG 112.0 09/29/2015      Wt Readings from Last 3 Encounters:  06/28/16 249 lb (112.9 kg)  11/12/15 253 lb 12 oz (115.1 kg)  10/07/15 250 lb (113.4 kg)    Up 3 pounds in one year   ASSESSMENT AND PLAN:   Atrial fibrillation, new, persistent  Onset unclear as he is asymptomatic Denies any symptoms such as leg swelling, shortness of breath, lightheadedness  dizziness or chest pain. He is on warfarin and therapeutic Long discussion with him concerning atrial fibrillation, management Echocardiogram has been ordered to evaluate LV function and rule out valve disease, estimated left atrial size Depending on cardiac anatomy, will proceed with cardioversion   Essential hypertension - Plan: EKG 12-Lead Recommended that he hold the losartan for now. We will start metoprolol 25 mg twice a day  He'll continue clonidine  We  will avoid calcium channel blockers given chronic lower extremity swelling,  We'll avoid diuretics given renal dysfunction  HYPERCHOLESTEROLEMIA Cholesterol is at goal on the current lipid regimen. No changes to the medications were made.  Localized edema Chronic lower extremity edema, stable  Wears compression hose, we'll avoid calcium channel blockers   Obesity We have encouraged continued exercise, careful diet management in an effort to lose weight.  Decreased GFR Recommended he stay hydrated We will avoid diuretics   Total encounter time more than 45 minutes  Greater than 50% was spent in counseling and coordination of care with the patient   Disposition:   F/U  1 months   Orders Placed This Encounter  Procedures  . EKG 12-Lead     Signed, Esmond Plants, M.D., Ph.D. 06/28/2016  [place], [place]

## 2016-06-28 ENCOUNTER — Ambulatory Visit (INDEPENDENT_AMBULATORY_CARE_PROVIDER_SITE_OTHER): Payer: Medicare Other | Admitting: Cardiovascular Disease

## 2016-06-28 ENCOUNTER — Encounter: Payer: Self-pay | Admitting: Cardiovascular Disease

## 2016-06-28 ENCOUNTER — Ambulatory Visit (INDEPENDENT_AMBULATORY_CARE_PROVIDER_SITE_OTHER): Payer: Medicare Other | Admitting: *Deleted

## 2016-06-28 VITALS — BP 110/70 | HR 95 | Ht 71.0 in | Wt 249.0 lb

## 2016-06-28 DIAGNOSIS — R600 Localized edema: Secondary | ICD-10-CM | POA: Diagnosis not present

## 2016-06-28 DIAGNOSIS — I2510 Atherosclerotic heart disease of native coronary artery without angina pectoris: Secondary | ICD-10-CM

## 2016-06-28 DIAGNOSIS — I2583 Coronary atherosclerosis due to lipid rich plaque: Secondary | ICD-10-CM | POA: Diagnosis not present

## 2016-06-28 DIAGNOSIS — Z7901 Long term (current) use of anticoagulants: Secondary | ICD-10-CM | POA: Diagnosis not present

## 2016-06-28 DIAGNOSIS — I481 Persistent atrial fibrillation: Secondary | ICD-10-CM | POA: Diagnosis not present

## 2016-06-28 DIAGNOSIS — I82409 Acute embolism and thrombosis of unspecified deep veins of unspecified lower extremity: Secondary | ICD-10-CM

## 2016-06-28 DIAGNOSIS — E7800 Pure hypercholesterolemia, unspecified: Secondary | ICD-10-CM

## 2016-06-28 DIAGNOSIS — I82491 Acute embolism and thrombosis of other specified deep vein of right lower extremity: Secondary | ICD-10-CM | POA: Diagnosis not present

## 2016-06-28 DIAGNOSIS — I10 Essential (primary) hypertension: Secondary | ICD-10-CM | POA: Diagnosis not present

## 2016-06-28 DIAGNOSIS — I4891 Unspecified atrial fibrillation: Secondary | ICD-10-CM

## 2016-06-28 DIAGNOSIS — I4819 Other persistent atrial fibrillation: Secondary | ICD-10-CM

## 2016-06-28 LAB — POCT INR: INR: 2.9

## 2016-06-28 MED ORDER — METOPROLOL TARTRATE 25 MG PO TABS: 25.0000 mg | ORAL_TABLET | Freq: Two times a day (BID) | ORAL | 6 refills | Status: DC

## 2016-06-28 NOTE — Patient Instructions (Addendum)
Medication Instructions:   Please stop the losartan   Start metoprolol one pill twice a day  Labwork:  No new labs needed  Testing/Procedures:  We will order an echocardiogram for new atrial fibrillation   I recommend watching educational videos on topics of interest to you at:       [email or web address]  Enter code: HEARTCARE    Follow-Up: It was a pleasure seeing you in the office today. Please call us if you have new issues that need to be addressed before your next appt.  [phone]  Your physician wants you to follow-up in: 1 month.    If you need a refill on your cardiac medications before your next appointment, please call your pharmacy.    Echocardiogram An echocardiogram, or echocardiography, uses sound waves (ultrasound) to produce an image of your heart. The echocardiogram is simple, painless, obtained within a short period of time, and offers valuable information to your health care provider. The images from an echocardiogram can provide information such as:  Evidence of coronary artery disease (CAD).  Heart size.  Heart muscle function.  Heart valve function.  Aneurysm detection.  Evidence of a past heart attack.  Fluid buildup around the heart.  Heart muscle thickening.  Assess heart valve function.  Tell a health care provider about:  Any allergies you have.  All medicines you are taking, including vitamins, herbs, eye drops, creams, and over-the-counter medicines.  Any problems you or family members have had with anesthetic medicines.  Any blood disorders you have.  Any surgeries you have had.  Any medical conditions you have.  Whether you are pregnant or may be pregnant. What happens before the procedure? No special preparation is needed. Eat and drink normally. What happens during the procedure?  In order to produce an image of your heart, gel will be applied to your chest and a wand-like tool (transducer) will be moved over your  chest. The gel will help transmit the sound waves from the transducer. The sound waves will harmlessly bounce off your heart to allow the heart images to be captured in real-time motion. These images will then be recorded.  You may need an IV to receive a medicine that improves the quality of the pictures. What happens after the procedure? You may return to your normal schedule including diet, activities, and medicines, unless your health care provider tells you otherwise. This information is not intended to replace advice given to you by your health care provider. Make sure you discuss any questions you have with your health care provider. Document Released: 01/01/2000 Document Revised: 08/22/2015 Document Reviewed: 09/10/2012 Elsevier Interactive Patient Education  2017 [employer].

## 2016-07-01 ENCOUNTER — Other Ambulatory Visit: Payer: Self-pay | Admitting: Cardiovascular Disease

## 2016-07-01 DIAGNOSIS — I4891 Unspecified atrial fibrillation: Secondary | ICD-10-CM

## 2016-07-01 DIAGNOSIS — I10 Essential (primary) hypertension: Secondary | ICD-10-CM

## 2016-07-01 DIAGNOSIS — Z7901 Long term (current) use of anticoagulants: Secondary | ICD-10-CM

## 2016-07-01 DIAGNOSIS — I2510 Atherosclerotic heart disease of native coronary artery without angina pectoris: Secondary | ICD-10-CM

## 2016-07-01 DIAGNOSIS — E7800 Pure hypercholesterolemia, unspecified: Secondary | ICD-10-CM

## 2016-07-01 DIAGNOSIS — I2583 Coronary atherosclerosis due to lipid rich plaque: Principal | ICD-10-CM

## 2016-07-01 DIAGNOSIS — R600 Localized edema: Secondary | ICD-10-CM

## 2016-07-06 ENCOUNTER — Telehealth: Payer: Self-pay | Admitting: Cardiovascular Disease

## 2016-07-06 NOTE — Telephone Encounter (Signed)
If possible would wait on dental surgery until we restore normal sinus rhythm One month on anticoagulation starting last week In cardioversion Then 1 more month on anticoagulation Then can stop If he cannot wait that long he can stop warfarin for several days prior to the procedure, have the dental surgery, then he would need to wait likely 5-6 weeks on the warfarin until we can do cardioversion (INR >2 for 4 weeks)

## 2016-07-06 NOTE — Telephone Encounter (Signed)
Routed to fax # provided. 

## 2016-07-06 NOTE — Telephone Encounter (Signed)
Pt is having implant dental surgery and needs clearance , asks if he needs to stop his coumadin. Please call and advise.

## 2016-07-06 NOTE — Telephone Encounter (Signed)
Pt sched for upcoming dental implant surgery. DOS has not been scheduled yet pending recommendations. Pt will be lying flat for procedure and may have moderate bleeding.  Dr. Maurine Simmering requests clearance and if pt needs to hold coumadin prior to procedure.   Please route to [phone].

## 2016-07-07 NOTE — Telephone Encounter (Signed)
Routed to fax # provided. 

## 2016-07-07 NOTE — Telephone Encounter (Signed)
As detailed below 2 months Or just stop the anticoagulation, have the dental work done Then follow the plan as detailed below with one month of anticoagulation then cardioversion It is up to the patient

## 2016-07-07 NOTE — Telephone Encounter (Signed)
Dr Governor Specking calling   He would like to know how long should they wait until we restore patient normal sinus rhythm   If it would be months then he can do a bridge on patient instead of doing a implantation   Would like advise on this Please call back

## 2016-07-14 ENCOUNTER — Other Ambulatory Visit: Payer: Self-pay | Admitting: Family Medicine

## 2016-07-15 ENCOUNTER — Other Ambulatory Visit: Payer: Self-pay

## 2016-07-15 ENCOUNTER — Ambulatory Visit (INDEPENDENT_AMBULATORY_CARE_PROVIDER_SITE_OTHER): Payer: Medicare Other

## 2016-07-15 DIAGNOSIS — Z7901 Long term (current) use of anticoagulants: Secondary | ICD-10-CM

## 2016-07-15 DIAGNOSIS — I2583 Coronary atherosclerosis due to lipid rich plaque: Secondary | ICD-10-CM

## 2016-07-15 DIAGNOSIS — I10 Essential (primary) hypertension: Secondary | ICD-10-CM

## 2016-07-15 DIAGNOSIS — R600 Localized edema: Secondary | ICD-10-CM

## 2016-07-15 DIAGNOSIS — I2510 Atherosclerotic heart disease of native coronary artery without angina pectoris: Secondary | ICD-10-CM | POA: Diagnosis not present

## 2016-07-15 DIAGNOSIS — I4891 Unspecified atrial fibrillation: Secondary | ICD-10-CM | POA: Diagnosis not present

## 2016-07-15 DIAGNOSIS — E7800 Pure hypercholesterolemia, unspecified: Secondary | ICD-10-CM | POA: Diagnosis not present

## 2016-07-15 NOTE — Telephone Encounter (Signed)
Last refill 06/20/16 Last OV 10/01/15 Ok to refill?

## 2016-07-15 NOTE — Telephone Encounter (Signed)
Called in to [place], [place]: [phone]

## 2016-07-18 ENCOUNTER — Telehealth: Payer: Self-pay | Admitting: Cardiovascular Disease

## 2016-07-18 ENCOUNTER — Other Ambulatory Visit: Payer: Self-pay

## 2016-07-18 DIAGNOSIS — R931 Abnormal findings on diagnostic imaging of heart and coronary circulation: Secondary | ICD-10-CM

## 2016-07-18 DIAGNOSIS — I4891 Unspecified atrial fibrillation: Secondary | ICD-10-CM

## 2016-07-18 NOTE — Telephone Encounter (Signed)
Received BP record on pt: Date BP, HR  AM  PM 6/13   117/74, 76 6/14 133/76, 62 113/76, 77 6/15 122/86, 85 121/83, 95 119/88 6/16 101/75, 71 119/82, 80 6/17 138/84, 65 6/18 111/73, 67 6/19 110/74, 58 6/20 114/79, 59 6/21 117/87, 87 6/22 107/79, 76 114/72, 59 6/23 103/75, 80 118/76, 83 6/24 125/88, 76  115/72, 82 115/78, 65 6/25 132/88, 70 119/78, 89 123/93 6/26 106/75, 57 119/76, 78 6/27 107/79, 73 112/73, 79 6/28 127/86, 76 112/82, 75 6/29 100/66, 56

## 2016-07-19 ENCOUNTER — Other Ambulatory Visit: Payer: Self-pay | Admitting: Cardiovascular Disease

## 2016-07-19 ENCOUNTER — Other Ambulatory Visit: Payer: Self-pay | Admitting: *Deleted

## 2016-07-19 ENCOUNTER — Other Ambulatory Visit: Payer: Self-pay | Admitting: Family Medicine

## 2016-07-19 MED ORDER — BUPROPION HCL ER (XL) 150 MG PO TB24: 150.0000 mg | ORAL_TABLET | Freq: Every day | ORAL | 1 refills | Status: DC

## 2016-07-19 MED ORDER — WARFARIN SODIUM 5 MG PO TABS: 5.0000 mg | ORAL_TABLET | ORAL | 3 refills | Status: DC

## 2016-07-19 NOTE — Telephone Encounter (Signed)
Refill Request.  

## 2016-07-19 NOTE — Telephone Encounter (Signed)
Pt D/c Losartan 100 mg tablet last ov 06/28/16.

## 2016-07-25 ENCOUNTER — Other Ambulatory Visit (INDEPENDENT_AMBULATORY_CARE_PROVIDER_SITE_OTHER): Payer: Medicare Other

## 2016-07-25 DIAGNOSIS — I4891 Unspecified atrial fibrillation: Secondary | ICD-10-CM

## 2016-07-25 DIAGNOSIS — R931 Abnormal findings on diagnostic imaging of heart and coronary circulation: Secondary | ICD-10-CM

## 2016-07-26 LAB — CBC WITH DIFFERENTIAL/PLATELET
BASOS: 1 %
Basophils Absolute: 0 10*3/uL (ref 0.0–0.2)
EOS (ABSOLUTE): 0.2 10*3/uL (ref 0.0–0.4)
Eos: 3 %
HEMATOCRIT: 43.8 % (ref 37.5–51.0)
Hemoglobin: 14.4 g/dL (ref 13.0–17.7)
IMMATURE GRANS (ABS): 0 10*3/uL (ref 0.0–0.1)
Immature Granulocytes: 0 %
LYMPHS: 42 %
Lymphocytes Absolute: 2.5 10*3/uL (ref 0.7–3.1)
MCH: 30.1 pg (ref 26.6–33.0)
MCHC: 32.9 g/dL (ref 31.5–35.7)
MCV: 92 fL (ref 79–97)
Monocytes Absolute: 0.4 10*3/uL (ref 0.1–0.9)
Monocytes: 6 %
NEUTROS ABS: 2.9 10*3/uL (ref 1.4–7.0)
Neutrophils: 48 %
PLATELETS: 180 10*3/uL (ref 150–379)
RBC: 4.78 x10E6/uL (ref 4.14–5.80)
RDW: 14 % (ref 12.3–15.4)
WBC: 6 10*3/uL (ref 3.4–10.8)

## 2016-07-26 LAB — BASIC METABOLIC PANEL
BUN/Creatinine Ratio: 10 (ref 10–24)
BUN: 14 mg/dL (ref 8–27)
CALCIUM: 9.1 mg/dL (ref 8.6–10.2)
CO2: 23 mmol/L (ref 20–29)
Chloride: 108 mmol/L — ABNORMAL HIGH (ref 96–106)
Creatinine, Ser: 1.42 mg/dL — ABNORMAL HIGH (ref 0.76–1.27)
GFR, EST AFRICAN AMERICAN: 57 mL/min/{1.73_m2} — AB (ref >59)
GFR, EST NON AFRICAN AMERICAN: 49 mL/min/{1.73_m2} — AB (ref >59)
Glucose: 98 mg/dL (ref 65–99)
POTASSIUM: 4.7 mmol/L (ref 3.5–5.2)
SODIUM: 146 mmol/L — AB (ref 134–144)

## 2016-07-26 LAB — PROTIME-INR
INR: 2.6 — ABNORMAL HIGH (ref 0.8–1.2)
Prothrombin Time: 25.8 s — ABNORMAL HIGH (ref 9.1–12.0)

## 2016-08-02 ENCOUNTER — Other Ambulatory Visit: Payer: Self-pay | Admitting: Cardiovascular Disease

## 2016-08-02 ENCOUNTER — Ambulatory Visit: Payer: Medicare Other | Admitting: Cardiovascular Disease

## 2016-08-02 ENCOUNTER — Telehealth: Payer: Self-pay | Admitting: Cardiovascular Disease

## 2016-08-02 NOTE — Telephone Encounter (Signed)
Spoke w/ pt. Reviewed pre-DCCV instructions w/ him.  He verbalizes understanding and is appreciative of the call.

## 2016-08-03 ENCOUNTER — Ambulatory Visit
Admission: RE | Admit: 2016-08-03 | Discharge: 2016-08-03 | Disposition: A | Discharge status: Home / Self Care | Payer: Medicare Other | Source: Ambulatory Visit | Attending: Cardiovascular Disease | Admitting: Cardiovascular Disease

## 2016-08-03 ENCOUNTER — Encounter
Admission: RE | Disposition: A | Discharge status: Home / Self Care | Payer: Self-pay | Source: Ambulatory Visit | Attending: Cardiovascular Disease

## 2016-08-03 ENCOUNTER — Ambulatory Visit: Payer: Medicare Other | Admitting: Registered Nurse

## 2016-08-03 ENCOUNTER — Encounter: Payer: Self-pay | Admitting: *Deleted

## 2016-08-03 DIAGNOSIS — K589 Irritable bowel syndrome without diarrhea: Secondary | ICD-10-CM | POA: Insufficient documentation

## 2016-08-03 DIAGNOSIS — Z7901 Long term (current) use of anticoagulants: Secondary | ICD-10-CM | POA: Diagnosis not present

## 2016-08-03 DIAGNOSIS — G4730 Sleep apnea, unspecified: Secondary | ICD-10-CM | POA: Insufficient documentation

## 2016-08-03 DIAGNOSIS — F419 Anxiety disorder, unspecified: Secondary | ICD-10-CM | POA: Insufficient documentation

## 2016-08-03 DIAGNOSIS — Z86718 Personal history of other venous thrombosis and embolism: Secondary | ICD-10-CM | POA: Insufficient documentation

## 2016-08-03 DIAGNOSIS — I481 Persistent atrial fibrillation: Secondary | ICD-10-CM | POA: Diagnosis not present

## 2016-08-03 DIAGNOSIS — Z87891 Personal history of nicotine dependence: Secondary | ICD-10-CM | POA: Insufficient documentation

## 2016-08-03 DIAGNOSIS — I499 Cardiac arrhythmia, unspecified: Secondary | ICD-10-CM | POA: Insufficient documentation

## 2016-08-03 DIAGNOSIS — E785 Hyperlipidemia, unspecified: Secondary | ICD-10-CM | POA: Insufficient documentation

## 2016-08-03 DIAGNOSIS — Z86010 Personal history of colonic polyps: Secondary | ICD-10-CM | POA: Insufficient documentation

## 2016-08-03 DIAGNOSIS — K219 Gastro-esophageal reflux disease without esophagitis: Secondary | ICD-10-CM | POA: Insufficient documentation

## 2016-08-03 DIAGNOSIS — I4891 Unspecified atrial fibrillation: Secondary | ICD-10-CM | POA: Diagnosis not present

## 2016-08-03 DIAGNOSIS — Z79899 Other long term (current) drug therapy: Secondary | ICD-10-CM | POA: Insufficient documentation

## 2016-08-03 DIAGNOSIS — I10 Essential (primary) hypertension: Secondary | ICD-10-CM | POA: Diagnosis not present

## 2016-08-03 DIAGNOSIS — Z9989 Dependence on other enabling machines and devices: Secondary | ICD-10-CM | POA: Insufficient documentation

## 2016-08-03 HISTORY — PX: CARDIOVERSION: EP1203

## 2016-08-03 LAB — PROTIME-INR
INR: 2.73
Prothrombin Time: 29.5 seconds — ABNORMAL HIGH (ref 11.4–15.2)

## 2016-08-03 SURGERY — CARDIOVERSION (CATH LAB)
Anesthesia: General

## 2016-08-03 MED ORDER — PROPOFOL 10 MG/ML IV BOLUS
INTRAVENOUS | Status: AC
  Filled 2016-08-03: qty 40

## 2016-08-03 MED ORDER — EPHEDRINE SULFATE 50 MG/ML IJ SOLN
INTRAMUSCULAR | Status: AC
  Filled 2016-08-03: qty 1

## 2016-08-03 MED ORDER — SUCCINYLCHOLINE CHLORIDE 20 MG/ML IJ SOLN
INTRAMUSCULAR | Status: AC
  Filled 2016-08-03: qty 1

## 2016-08-03 MED ORDER — SODIUM CHLORIDE 0.9 % IV SOLN
INTRAVENOUS | Status: DC | PRN
  Administered 2016-08-03: 08:00:00 via INTRAVENOUS

## 2016-08-03 MED ORDER — PROPOFOL 10 MG/ML IV BOLUS
INTRAVENOUS | Status: DC | PRN
  Administered 2016-08-03: 90 mg via INTRAVENOUS

## 2016-08-03 NOTE — H&P (Signed)
H&P Addendum, pre-cardioversion  Patient was seen and evaluated prior to -cardioversion procedure Symptoms, prior testing details again confirmed with the patient Patient examined, no significant change from prior exam Lab work reviewed in detail personally by myself Patient understands risk and benefit of the procedure, willing to proceed  Signed, Tim Gollan, MD, Ph.D [facility]  

## 2016-08-03 NOTE — Anesthesia Post-op Follow-up Note (Cosign Needed)
Anesthesia QCDR form completed.        

## 2016-08-03 NOTE — Anesthesia Procedure Notes (Signed)
Date/Time: 08/03/2016 7:45 AM Performed by: Hedda Slade Pre-anesthesia Checklist: Patient identified, Emergency Drugs available, Suction available and Patient being monitored Patient Re-evaluated:Patient Re-evaluated prior to induction Oxygen Delivery Method: Nasal cannula

## 2016-08-03 NOTE — Anesthesia Preprocedure Evaluation (Signed)
Anesthesia Evaluation  Patient identified by MRN, date of birth, ID band Patient awake    Reviewed: Allergy & Precautions, H&P , NPO status , Patient's Chart, lab work & pertinent test results, reviewed documented beta blocker date and time   Airway Mallampati: II   Neck ROM: full    Dental  (+) Teeth Intact, Partial Upper   Pulmonary neg pulmonary ROS, sleep apnea and Continuous Positive Airway Pressure Ventilation , pneumonia, resolved, former smoker,    Pulmonary exam normal        Cardiovascular Exercise Tolerance: Poor hypertension, On Medications negative cardio ROS Normal cardiovascular examAtrial Fibrillation  Rhythm:regular Rate:Normal     Neuro/Psych PSYCHIATRIC DISORDERS negative neurological ROS  negative psych ROS   GI/Hepatic negative GI ROS, Neg liver ROS, GERD  Medicated,  Endo/Other  negative endocrine ROS  Renal/GU negative Renal ROS  negative genitourinary   Musculoskeletal   Abdominal   Peds  Hematology negative hematology ROS (+)   Anesthesia Other Findings Past Medical History: No date: Anxiety No date: Clotting disorder ([place]) No date: Colon polyps     Comment:  2008 Tubular Adenoma No date: Cough     Comment:  chronic No date: Depression No date: DVT (deep venous thrombosis) ([facility])     Comment:  hx of blood clot in right leg 8 yrs ago No date: GERD (gastroesophageal reflux disease) No date: Hyperlipidemia No date: Hypertension     Comment:  controlled on meds No date: IBS (irritable bowel syndrome) No date: Irregular heart beat No date: Palpitations     Comment:  arryhmia/Dr Marcello Moores Wall/ Dr Gollam/ [facility] No date: Pneumonia     Comment:  3 wks ago/ no hospitalization/ no follow up required/               was on antibiotic and steroids/Dr Edilia Bo No date: Presence of partial dental prosthetic device     Comment:  lower right No date: Seasonal allergies Past Surgical  History: No date: APPENDECTOMY     Comment:  1967 07/08/2015: CATARACT EXTRACTION W/PHACO; Right     Comment:  Procedure: CATARACT EXTRACTION PHACO AND INTRAOCULAR               LENS PLACEMENT ([place]) RIGHT EYE;  Surgeon: Leandrew Koyanagi, MD;  Location: [facility] [place];  Service:              Ophthalmology;  Laterality: Right;   SYMFONY LENS 08/05/2015: CATARACT EXTRACTION W/PHACO; Left     Comment:  Procedure: CATARACT EXTRACTION PHACO AND INTRAOCULAR               LENS PLACEMENT ([place]) left eye;  Surgeon: Leandrew Koyanagi, MD;  Location: [facility];  Service:              Ophthalmology;  Laterality: Left;  SYMFONY LENS 08/2015: CATARACT EXTRACTION, BILATERAL; Bilateral No date: COLONOSCOPY     Comment:  x3 No date: POLYPECTOMY No date: TONSILLECTOMY AND ADENOIDECTOMY BMI    Body Mass Index:  34.17 kg/m     Reproductive/Obstetrics negative OB ROS                             Anesthesia Physical Anesthesia Plan  ASA: IV  Anesthesia Plan: General   Post-op Pain  Management:    Induction:   PONV Risk Score and Plan: 2 and Ondansetron and Dexamethasone  Airway Management Planned:   Additional Equipment:   Intra-op Plan:   Post-operative Plan:   Informed Consent: I have reviewed the patients History and Physical, chart, labs and discussed the procedure including the risks, benefits and alternatives for the proposed anesthesia with the patient or authorized representative who has indicated his/her understanding and acceptance.   Dental Advisory Given  Plan Discussed with: CRNA  Anesthesia Plan Comments:         Anesthesia Quick Evaluation

## 2016-08-03 NOTE — CV Procedure (Signed)
Cardioversion procedure note °For atrial fibrillation, persistent. ° °Procedure Details: ° °Consent: Risks of procedure as well as the alternatives and risks of each were explained to the (patient/caregiver).  Consent for procedure obtained. ° °Time Out: Verified patient identification, verified procedure, site/side was marked, verified correct patient position, special equipment/implants available, medications/allergies/relevent history reviewed, required imaging and test results available.  Performed ° °Patient placed on cardiac monitor, pulse oximetry, supplemental oxygen as necessary.   °Sedation given: propofol IV, Dr. Adams °Pacer pads placed anterior and posterior chest. ° ° °Cardioverted 1 time(s).   °Cardioverted at  150 J. Synchronized biphasic °Converted to NSR ° ° °Evaluation: °Findings: Post procedure EKG shows: NSR °Complications: None °Patient did tolerate procedure well. ° °Time Spent Directly with the Patient: ° °45 minutes  ° °Anthony Kim, M.D., Ph.D.  °

## 2016-08-03 NOTE — Transfer of Care (Signed)
Immediate Anesthesia Transfer of Care Note  Patient: Anthony Kim.  Procedure(s) Performed: Procedure(s): CARDIOVERSION (N/A)  Patient Location: PACU  Anesthesia Type:General  Level of Consciousness: sedated  Airway & Oxygen Therapy: Patient Spontanous Breathing and Patient connected to nasal cannula oxygen  Post-op Assessment: Report given to RN and Post -op Vital signs reviewed and stable  Post vital signs: Reviewed and stable  Last Vitals:  Vitals:   08/03/16 0753 08/03/16 0754  BP:  (!) 116/94  Pulse: 60 70  Resp: 15 15    Last Pain: There were no vitals filed for this visit.       Complications: No apparent anesthesia complications

## 2016-08-03 NOTE — Anesthesia Postprocedure Evaluation (Signed)
Anesthesia Post Note  Patient: Anthony Kim.  Procedure(s) Performed: Procedure(s) (LRB): CARDIOVERSION (N/A)  Patient location during evaluation: PACU Anesthesia Type: General Level of consciousness: awake and alert Pain management: pain level controlled Vital Signs Assessment: post-procedure vital signs reviewed and stable Respiratory status: spontaneous breathing, nonlabored ventilation, respiratory function stable and patient connected to nasal cannula oxygen Cardiovascular status: blood pressure returned to baseline and stable Postop Assessment: no signs of nausea or vomiting Anesthetic complications: no     Last Vitals:  Vitals:   08/03/16 0815 08/03/16 0830  BP: 124/80 119/86  Pulse: (!) 56 64  Resp: 16 17    Last Pain: There were no vitals filed for this visit.               Molli Barrows

## 2016-08-04 ENCOUNTER — Telehealth: Payer: Self-pay | Admitting: Cardiovascular Disease

## 2016-08-04 NOTE — Telephone Encounter (Signed)
Patient called in regarding his upcoming dental implant surgery. He wanted to know the directions for holding his coumadin and reviewed previous recommendations that they advised to continue coumadin for one month post cardioversion. Reviewed that he has upcoming appointment with Ignacia Bayley NP here in our office in August and at that visit he can discuss the instructions for holding and restarting any medications for that dental implant. He was agreeable to this plan and had no further questions at this time.

## 2016-08-04 NOTE — Telephone Encounter (Signed)
Pt would like a call to discuss some questions he has regarding his cardioversion he has yesterday.

## 2016-08-09 ENCOUNTER — Telehealth: Payer: Self-pay | Admitting: Cardiovascular Disease

## 2016-08-09 NOTE — Telephone Encounter (Signed)
Spoke w/ pt. Advised him that I discussed w/ Dr. Rockey Situ and he recommends that pt continue to monitor his HR. Advised him that if readings get and stay in the 30-40s, it is ok to cut his metoprolol 25 mg in 1/2 BID. He is agreeable and will call back w/ any further questions or concerns.

## 2016-08-09 NOTE — Telephone Encounter (Signed)
Spoke w/ pt.  He is s/p DCCV on 08/03/16. He reports that Dr. Rockey Situ advised him that he would like to see his HR in the 12s. He reports on Sun his HR was 38 and felt "very draggy". After lunch that day, HR was 65. Mon 73, today 46 and he still feels tired. When he moves around, his HR is in the 60-70s, but he wants to make sure this is ok.

## 2016-08-10 ENCOUNTER — Ambulatory Visit (INDEPENDENT_AMBULATORY_CARE_PROVIDER_SITE_OTHER): Payer: Medicare Other

## 2016-08-10 DIAGNOSIS — I2583 Coronary atherosclerosis due to lipid rich plaque: Secondary | ICD-10-CM

## 2016-08-10 DIAGNOSIS — Z7901 Long term (current) use of anticoagulants: Secondary | ICD-10-CM

## 2016-08-10 DIAGNOSIS — Z5181 Encounter for therapeutic drug level monitoring: Secondary | ICD-10-CM

## 2016-08-10 DIAGNOSIS — I82491 Acute embolism and thrombosis of other specified deep vein of right lower extremity: Secondary | ICD-10-CM | POA: Diagnosis not present

## 2016-08-10 DIAGNOSIS — I82409 Acute embolism and thrombosis of unspecified deep veins of unspecified lower extremity: Secondary | ICD-10-CM

## 2016-08-10 DIAGNOSIS — I2510 Atherosclerotic heart disease of native coronary artery without angina pectoris: Secondary | ICD-10-CM

## 2016-08-10 LAB — POCT INR: INR: 1.3

## 2016-08-15 NOTE — Telephone Encounter (Signed)
Spoke w/ pt's wife.  She reports the following BP & HR readings for pt over the past few days: 7/24: 101/62, 53 7/25:  106/78, 64 7/26:  113/74, 64 7/27:  113/66, 70 7/28: 94/63, 66 7/29: 89/65, 71 Pt is overall doing well, but is tired. He is not home, but would like recommendations on if he should decrease or stop his metoprolol. Advised her that I will make Dr. Rockey Situ aware and call her back w/ his recommendation.

## 2016-08-16 ENCOUNTER — Other Ambulatory Visit: Payer: Self-pay | Admitting: Cardiovascular Disease

## 2016-08-16 NOTE — Telephone Encounter (Signed)
Spoke w/ pt.  Advised him of Dr. Cleon Gustin recommendation.  He is agreeable and will call back w/ any further questions.

## 2016-08-16 NOTE — Telephone Encounter (Signed)
I would stop the clonidine for now and continue to monitor her blood pressure

## 2016-08-17 ENCOUNTER — Ambulatory Visit (INDEPENDENT_AMBULATORY_CARE_PROVIDER_SITE_OTHER): Payer: Medicare Other

## 2016-08-17 DIAGNOSIS — Z7901 Long term (current) use of anticoagulants: Secondary | ICD-10-CM | POA: Diagnosis not present

## 2016-08-17 DIAGNOSIS — I82409 Acute embolism and thrombosis of unspecified deep veins of unspecified lower extremity: Secondary | ICD-10-CM

## 2016-08-17 DIAGNOSIS — Z5181 Encounter for therapeutic drug level monitoring: Secondary | ICD-10-CM | POA: Diagnosis not present

## 2016-08-17 LAB — POCT INR: INR: 2.4

## 2016-08-19 ENCOUNTER — Telehealth: Payer: Self-pay | Admitting: Cardiovascular Disease

## 2016-08-19 NOTE — Telephone Encounter (Signed)
Pt needs to discuss Clonidine. States this made his BP low, and he has stopped it, since he has stopped his BP is elevated. This morning it was 132/95 HR 69. Yesterday 143/93 HR 84. He asks should he 1/2 his Clonidine. Please call and advise.

## 2016-08-19 NOTE — Telephone Encounter (Signed)
Pt was advised on 7/31 to hold clonidine 2/2 low BP readings. He reports that since doing so, his BP has started to climb and would like to know if he can resume his clonidine, but take 1/2 the previous dosage. Clonidine 0.1 mg tabs are very small, so he is unsure if he can cut them. Please advise.  Thank you.

## 2016-08-22 NOTE — Telephone Encounter (Signed)
Ok to try and cut clonidine in 1/2 BID I do not believe they make it in a lower dose

## 2016-08-23 NOTE — Telephone Encounter (Signed)
Spoke with patient and he states that he has in fact been cutting them in half and his blood pressures have been better. He has only had one low blood pressure in the 61'P systolic. Instructed him to continue monitoring his blood pressures and give [place] a call back if he should have any continue problems either high or low. He verbalized understanding with no further questions at this time.

## 2016-08-23 NOTE — Telephone Encounter (Signed)
No answer.Voicemail is full. °

## 2016-08-31 ENCOUNTER — Ambulatory Visit (INDEPENDENT_AMBULATORY_CARE_PROVIDER_SITE_OTHER): Payer: Medicare Other

## 2016-08-31 DIAGNOSIS — Z5181 Encounter for therapeutic drug level monitoring: Secondary | ICD-10-CM | POA: Diagnosis not present

## 2016-08-31 DIAGNOSIS — Z86718 Personal history of other venous thrombosis and embolism: Secondary | ICD-10-CM | POA: Diagnosis not present

## 2016-08-31 LAB — POCT INR: INR: 4.4

## 2016-09-01 ENCOUNTER — Ambulatory Visit: Payer: Medicare Other | Admitting: Nurse Practitioner

## 2016-09-06 ENCOUNTER — Encounter: Payer: Self-pay | Admitting: Nurse Practitioner

## 2016-09-06 ENCOUNTER — Ambulatory Visit (INDEPENDENT_AMBULATORY_CARE_PROVIDER_SITE_OTHER): Payer: Medicare Other | Admitting: Nurse Practitioner

## 2016-09-06 VITALS — BP 128/96 | HR 71 | Ht 71.0 in | Wt 241.0 lb

## 2016-09-06 DIAGNOSIS — I4819 Other persistent atrial fibrillation: Secondary | ICD-10-CM

## 2016-09-06 DIAGNOSIS — Z86718 Personal history of other venous thrombosis and embolism: Secondary | ICD-10-CM

## 2016-09-06 DIAGNOSIS — I10 Essential (primary) hypertension: Secondary | ICD-10-CM | POA: Diagnosis not present

## 2016-09-06 DIAGNOSIS — I481 Persistent atrial fibrillation: Secondary | ICD-10-CM | POA: Diagnosis not present

## 2016-09-06 DIAGNOSIS — E782 Mixed hyperlipidemia: Secondary | ICD-10-CM

## 2016-09-06 DIAGNOSIS — I2510 Atherosclerotic heart disease of native coronary artery without angina pectoris: Secondary | ICD-10-CM

## 2016-09-06 DIAGNOSIS — I2583 Coronary atherosclerosis due to lipid rich plaque: Secondary | ICD-10-CM

## 2016-09-06 NOTE — Progress Notes (Signed)
Office Visit    Patient Name: Anthony Kim. Date of Encounter: 09/06/2016  Primary Care Provider:  Lucille Passy, MD Primary Cardiologist:  Johnny Bridge, MD   Chief Complaint    71 year old male with a history of hypertension, hyperlipidemia, coronary calcification with negative stress testing, obesity, right lower extremity DVT on chronic Coumadin and recent findings of persistent atrial fibrillation and mild LV dysfunction, who presents for follow-up after recent cardioversion.  Past Medical History    Past Medical History:  Diagnosis Date  . Anxiety   . CAD (coronary artery disease)    a. 07/2012 CTA chest - incidental coronary Ca2+ of LAD/LCX; b. 08/2012 Myoview: EF 59%, non ischemia/scar-->Low risk.  . Clotting disorder ([place])   . Colon polyps    2008 Tubular Adenoma  . Cough    chronic  . Depression   . DVT (deep venous thrombosis) ([place])    a. RLE DVT ~ 05/2010-->chronic coumadin.  Marland Kitchen GERD (gastroesophageal reflux disease)   . Hyperlipidemia   . Hypertension    controlled on meds  . IBS (irritable bowel syndrome)   . NICM (nonischemic cardiomyopathy) ([place])    a. 06/2016 Echo: EF 45-50%, diff HK, mod dil LA/RA.  Marland Kitchen Palpitations   . Persistent atrial fibrillation ([place])    a. Dx 06/2010 - CHA2DS2VASc = 3-4 (Chronic coumadin); b. 07/2016 s/p DCCV (150J) - initially successful but reverted to Afib.  . Pneumonia    3 wks ago/ no hospitalization/ no follow up required/ was on antibiotic and steroids/Dr Edilia Bo  . Presence of partial dental prosthetic device    lower right  . Seasonal allergies    Past Surgical History:  Procedure Laterality Date  . APPENDECTOMY     1967  . CARDIOVERSION N/A 08/03/2016   Procedure: CARDIOVERSION;  Surgeon: Minna Merritts, MD;  Location: [facility];  Service: Cardiovascular;  Laterality: N/A;  . CATARACT EXTRACTION W/PHACO Right 07/08/2015   Procedure: CATARACT EXTRACTION PHACO AND INTRAOCULAR LENS PLACEMENT ([place]) RIGHT EYE;  Surgeon:  Leandrew Koyanagi, MD;  Location: [place];  Service: Ophthalmology;  Laterality: Right;   SYMFONY LENS  . CATARACT EXTRACTION W/PHACO Left 08/05/2015   Procedure: CATARACT EXTRACTION PHACO AND INTRAOCULAR LENS PLACEMENT ([place]) left eye;  Surgeon: Leandrew Koyanagi, MD;  Location: [place];  Service: Ophthalmology;  Laterality: Left;  SYMFONY LENS  . CATARACT EXTRACTION, BILATERAL Bilateral 08/2015  . COLONOSCOPY     x3  . POLYPECTOMY    . TONSILLECTOMY AND ADENOIDECTOMY      Allergies  Allergies  Allergen Reactions  . Iodine Hives    Contrast dye/ betadine ok    History of Present Illness    71 year old male with the above past medical history including hypertension, hyperlipidemia, right lower extremity DVT in 2012 on chronic Coumadin, coronary calcification incidentally noted on chest CTA in 2014 with subsequent low risk stress test, obesity, and recent diagnosis of persistent atrial fibrillation. After recent diagnosis of atrial fibrillation in June of this year, an echocardiogram was performed showing mild LV dysfunction with an EF of 45-50% diffuse hypokinesis. Moderate biatrial enlargement was also noted. Patient was contacted and after some discussion, arrangements were arranged for cardioversion, which took place in mid June and was initially successful. Since cardioversion, patient says he has felt well. Blood pressures have trended on the low side and as result, his clonidine dose was cut in half to 0.05 mg twice a day. He checks his blood pressure and heart  rates on a regular basis and heart rates have been stable in the 60s to 80s. He remains active without symptoms or limitations. Specifically, he denies palpitations, chest pain, dyspnea, PND, orthopnea, dizziness, syncope, edema, or early satiety.  He is in atrial fibrillation today at a rate of 71 and is completely asymptomatic.  Home Medications    Prior to Admission medications   Medication Sig Start  Date End Date Taking? Authorizing Provider  acetaminophen (TYLENOL) 325 MG tablet Take 325 mg by mouth every 6 (six) hours as needed for pain.    Yes [provider]  ALPRAZOLAM XR 0.5 MG 24 hr tablet TAKE 1 TABLET DAILY. 07/15/16  Yes Lucille Passy, MD  atorvastatin (LIPITOR) 40 MG tablet Take 1 tablet (40 mg total) by mouth daily. 08/16/16  Yes Minna Merritts, MD  buPROPion (WELLBUTRIN XL) 150 MG 24 hr tablet Take 1 tablet (150 mg total) by mouth daily. 07/19/16  Yes Lucille Passy, MD  cloNIDine (CATAPRES) 0.1 MG tablet Take 0.05 mg by mouth 2 (two) times daily.   Yes [provider]  ezetimibe (ZETIA) 10 MG tablet Take 1 tablet (10 mg total) by mouth daily. 05/27/16  Yes Minna Merritts, MD  fenofibrate (TRICOR) 145 MG tablet Take 1 tablet (145 mg total) by mouth daily. 03/14/16  Yes Lucille Passy, MD  fexofenadine (ALLEGRA) 180 MG tablet Take 180 mg by mouth daily. am   Yes [provider]  fluticasone (FLONASE) 50 MCG/ACT nasal spray Place 2 sprays into both nostrils as needed. Reported on 07/08/2015   Yes [provider]  meclizine (ANTIVERT) 25 MG tablet Take 25 mg by mouth 3 (three) times daily as needed for dizziness. Reported on 07/01/2015   Yes [provider]  metoprolol tartrate (LOPRESSOR) 25 MG tablet Take 1 tablet (25 mg total) by mouth 2 (two) times daily. 06/28/16  Yes Minna Merritts, MD  Multiple Vitamin (MULTIVITAMIN PO) Take 1 tablet by mouth daily. pm   Yes [provider]  Omega-3 Fatty Acids (FISH OIL) 1000 MG CAPS Take 1 capsule by mouth daily. pm   Yes [provider]  omeprazole (PRILOSEC) 40 MG capsule Take 1 capsule (40 mg total) by mouth daily. 04/13/16  Yes Minna Merritts, MD  warfarin (COUMADIN) 5 MG tablet Take 1 tablet (5 mg total) by mouth as directed. 07/19/16  Yes Minna Merritts, MD    Review of Systems    He denies chest pain, palpitations, dyspnea, pnd, orthopnea, n, v, dizziness, syncope,  edema, weight gain, or early satiety.  All other systems reviewed and are otherwise negative except as noted above.  Physical Exam    VS:  BP (!) 128/96 (BP Location: Left Arm, Patient Position: Sitting, Cuff Size: Normal)   Pulse 71   Ht 5\' 11"  (1.803 m)   Wt 241 lb (109.3 kg)   BMI 33.61 kg/m  , BMI Body mass index is 33.61 kg/m. GEN: Well nourished, well developed, in no acute distress.  HEENT: normal.  Neck: Supple, no JVD, carotid bruits, or masses. Cardiac: IR, IR, no murmurs, rubs, or gallops. No clubbing, cyanosis, edema.  Radials/DP/PT 2+ and equal bilaterally.  Respiratory:  Respirations regular and unlabored, clear to auscultation bilaterally. GI: Soft, nontender, nondistended, BS + x 4. MS: no deformity or atrophy. Skin: warm and dry, no rash. Neuro:  Strength and sensation are intact. Psych: Normal affect.  Accessory Clinical Findings    ECG - atrial fibrillation, no  acute ST or T changes.  Assessment & Plan    1.  Persistent atrial fibrillation: Patient was recently diagnosed with atrial fibrillation in June. This was followed by an echo that showed mild LV dysfunction with an EF of 45-50%. He underwent cardioversion on June 18 which was initially successful. Patient is back in atrial fibrillation today and is asymptomatic. He hasno idea when he might have gone back into A. Fib. He has brought with him a month's worth of blood pressures and heart rates and his heart rate has been well controlled. He did have some slower heart rates and also lower blood pressures in late July and early August, though both have improved with having of his clonidine. We had a long discussion today about options for management of persistent asymptomatic atrial fibrillation. As he feels well, remains active, is well rate controlled, and is already on chronic anticoagulation in the setting of a history of right lower extremity DVT, it is his preference to continue with a rate control strategy as  opposed to pursuing any further rhythm management with antiarrhythmics or repeat cardioversion. I think this is perfectly acceptable. He'll remain on beta blocker therapy and will continue to be followed in our Coumadin clinic.  2. Essential hypertension: Blood pressure is well controlled. He did have some relative hypotension in late July and early August which has resolved with production of clonidine dosing.  3. Hyperlipidemia:  LDL was 83 in September 2017 with triglycerides of 112. He remains on statin, Zetia, fish oil, and fiber therapy.  4. Disposition: Follow-up with Dr. Rockey Situ in 3 months or sooner if necessary.   Murray Hodgkins, NP 09/06/2016, 12:35 PM

## 2016-09-06 NOTE — Patient Instructions (Signed)
Follow-Up: Your physician recommends that you schedule a follow-up appointment in: 3-4 months with Dr. Rockey Situ.   It was a pleasure seeing you today here in the office. Please do not hesitate to give [place] a call back if you have any further questions. [place], BSN

## 2016-09-12 ENCOUNTER — Other Ambulatory Visit: Payer: Self-pay | Admitting: Cardiovascular Disease

## 2016-09-12 ENCOUNTER — Telehealth: Payer: Self-pay | Admitting: Cardiovascular Disease

## 2016-09-12 MED ORDER — CLONIDINE HCL 0.1 MG PO TABS: 0.0500 mg | ORAL_TABLET | Freq: Two times a day (BID) | ORAL | 6 refills | Status: DC

## 2016-09-12 NOTE — Telephone Encounter (Signed)
Is the pt confused or the pharmacy?

## 2016-09-12 NOTE — Telephone Encounter (Signed)
Spoke w/ Nada Boozer, pharmacist.  Clarified that pt is taking 1/2 clonidine BID. Sent in new rx, as he is out of refills. Asked him to call back w/ any further questions or concerns.

## 2016-09-12 NOTE — Telephone Encounter (Signed)
Please contact patient for instructions on his clonidine. There is some confusion on what dose he is to be taking.

## 2016-09-12 NOTE — Telephone Encounter (Signed)
Pharmacy calling to clarify dosage for Clonidine .  Please call .

## 2016-09-12 NOTE — Telephone Encounter (Signed)
Pharmacy

## 2016-09-13 ENCOUNTER — Telehealth: Payer: Self-pay | Admitting: Cardiovascular Disease

## 2016-09-13 NOTE — Telephone Encounter (Signed)
Marjory Lies calling from Dr Lorenda Hatchet office asking for last INR reading on patient.  Please call back

## 2016-09-14 ENCOUNTER — Ambulatory Visit (INDEPENDENT_AMBULATORY_CARE_PROVIDER_SITE_OTHER): Payer: Medicare Other

## 2016-09-14 DIAGNOSIS — I2510 Atherosclerotic heart disease of native coronary artery without angina pectoris: Secondary | ICD-10-CM

## 2016-09-14 DIAGNOSIS — Z86718 Personal history of other venous thrombosis and embolism: Secondary | ICD-10-CM | POA: Diagnosis not present

## 2016-09-14 DIAGNOSIS — Z5181 Encounter for therapeutic drug level monitoring: Secondary | ICD-10-CM | POA: Diagnosis not present

## 2016-09-14 DIAGNOSIS — I2583 Coronary atherosclerosis due to lipid rich plaque: Secondary | ICD-10-CM

## 2016-09-14 LAB — POCT INR: INR: 2.7

## 2016-09-14 NOTE — Telephone Encounter (Addendum)
Spoke w/ Erin @ Dr. Lorenda Hatchet office. Advised her that pt's INR was 2.7 today, but they would like Dr. Donivan Scull thoughts on holding coumadin x "a couple of days" prior to procedure. Advised her that pt advised that the procedure would have very little bleeding, as they are making a small incision. She states that b/c they are cutting into the tissue, she would like to make sure.  Routing to Dr. Rockey Situ for his recommendation.

## 2016-09-15 NOTE — Telephone Encounter (Signed)
He can stop the warfarin for several days if needed Would make sure he is wearing his compression hose as he has a history of DVT

## 2016-09-15 NOTE — Telephone Encounter (Signed)
Attempted to contact [place]. Dr. Lorenda Hatchet office is closed for lunch until 2:30. Will reattempt to call again this afternoon.

## 2016-09-15 NOTE — Telephone Encounter (Signed)
Spoke w/ Junie Panning.  Advised her that due to pt's h/o DVT, I would recommend that pt only hold coumadin if Dr. Gloris Manchester absolutely wants him to.   She states that she reviewed w/ him yesterday and Dr. Gloris Manchester is agreeable to having pt continue on his coumadin thru procedure.  Asked her to call back w/ any further questions or concerns.

## 2016-09-16 NOTE — Telephone Encounter (Signed)
Spoke w/ pt.  He is aware to remain on coumadin thru his dental procedure and f/u as scheduled. He is appreciative of the call.

## 2016-09-20 DIAGNOSIS — L814 Other melanin hyperpigmentation: Secondary | ICD-10-CM | POA: Diagnosis not present

## 2016-09-20 DIAGNOSIS — L568 Other specified acute skin changes due to ultraviolet radiation: Secondary | ICD-10-CM | POA: Diagnosis not present

## 2016-09-20 DIAGNOSIS — L821 Other seborrheic keratosis: Secondary | ICD-10-CM | POA: Diagnosis not present

## 2016-09-20 DIAGNOSIS — L820 Inflamed seborrheic keratosis: Secondary | ICD-10-CM | POA: Diagnosis not present

## 2016-09-24 ENCOUNTER — Other Ambulatory Visit: Payer: Self-pay | Admitting: Cardiovascular Disease

## 2016-09-25 IMAGING — DX DG CHEST 2V
2 series · 2 of 2 positions shown · non-contrast
Comparison: Chest x-ray of August 15, 2012 and chest CT scan of the
same date

CLINICAL DATA: Cough and shortness of breath and abnormal chest
exam ; coronary artery disease.

EXAM:
CHEST  2 VIEW

[chest pa]
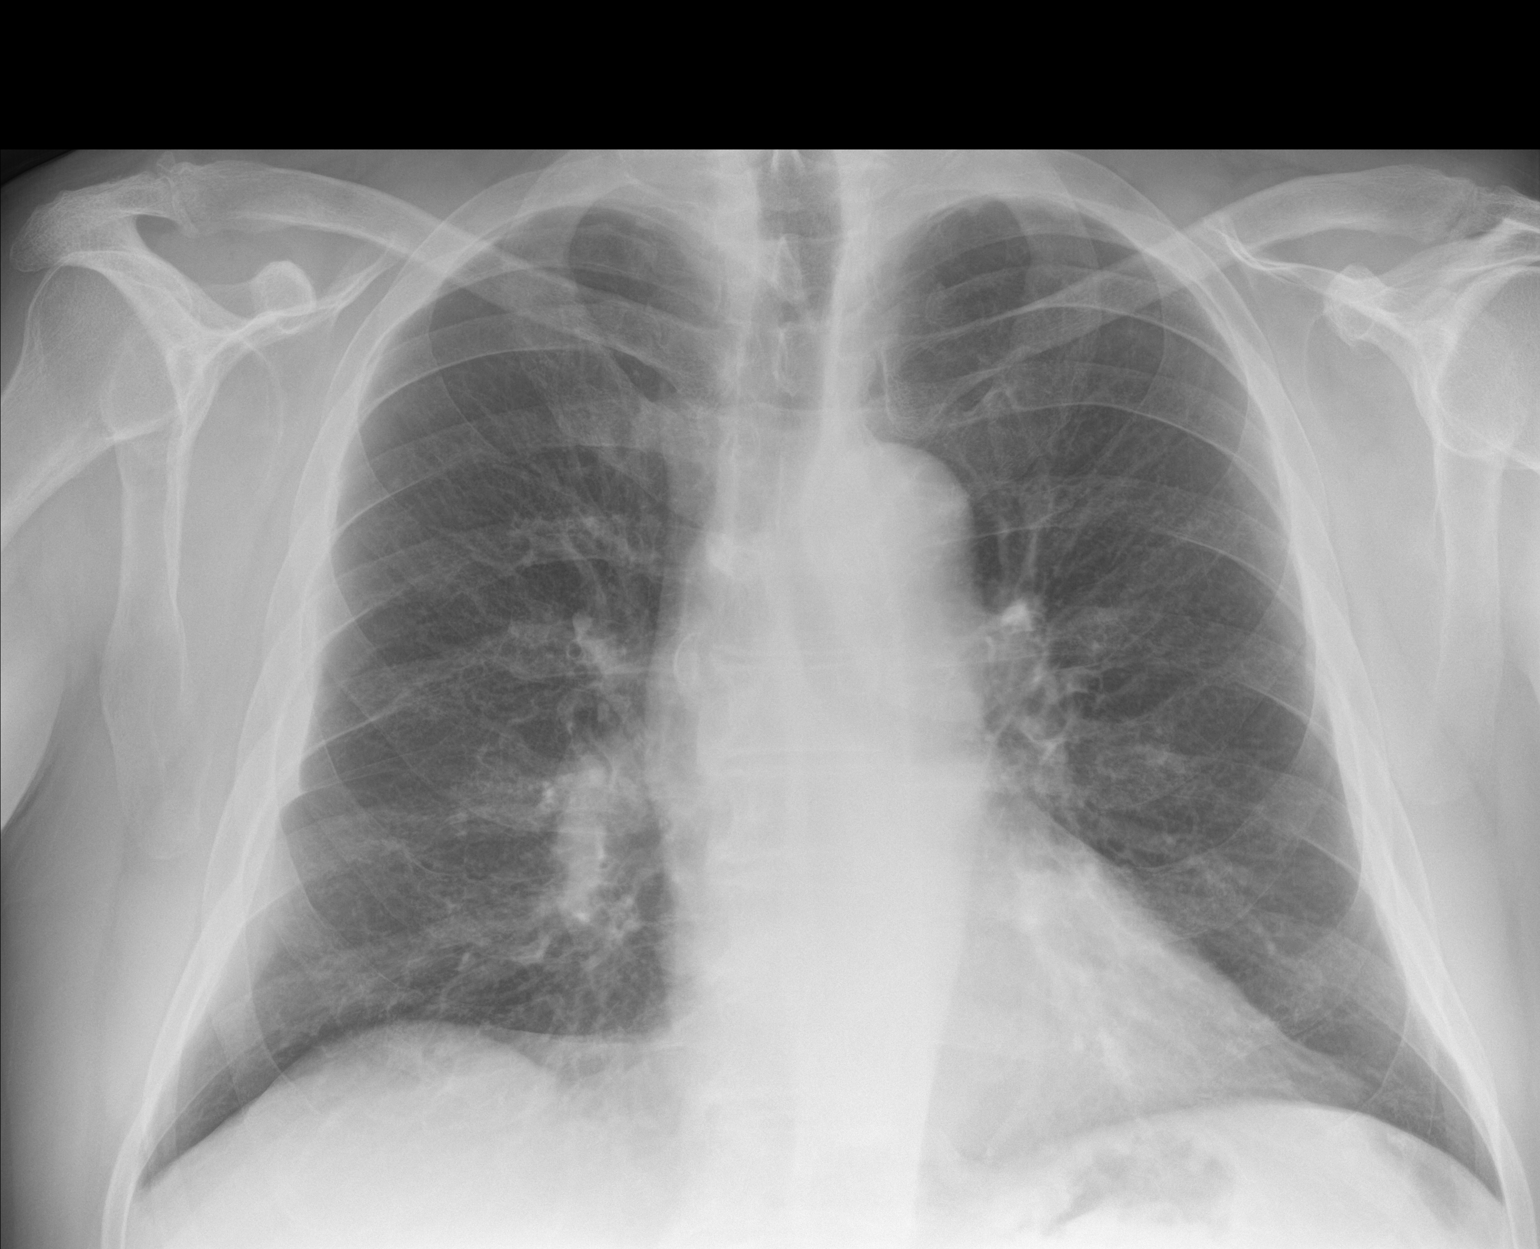

[chest lat]
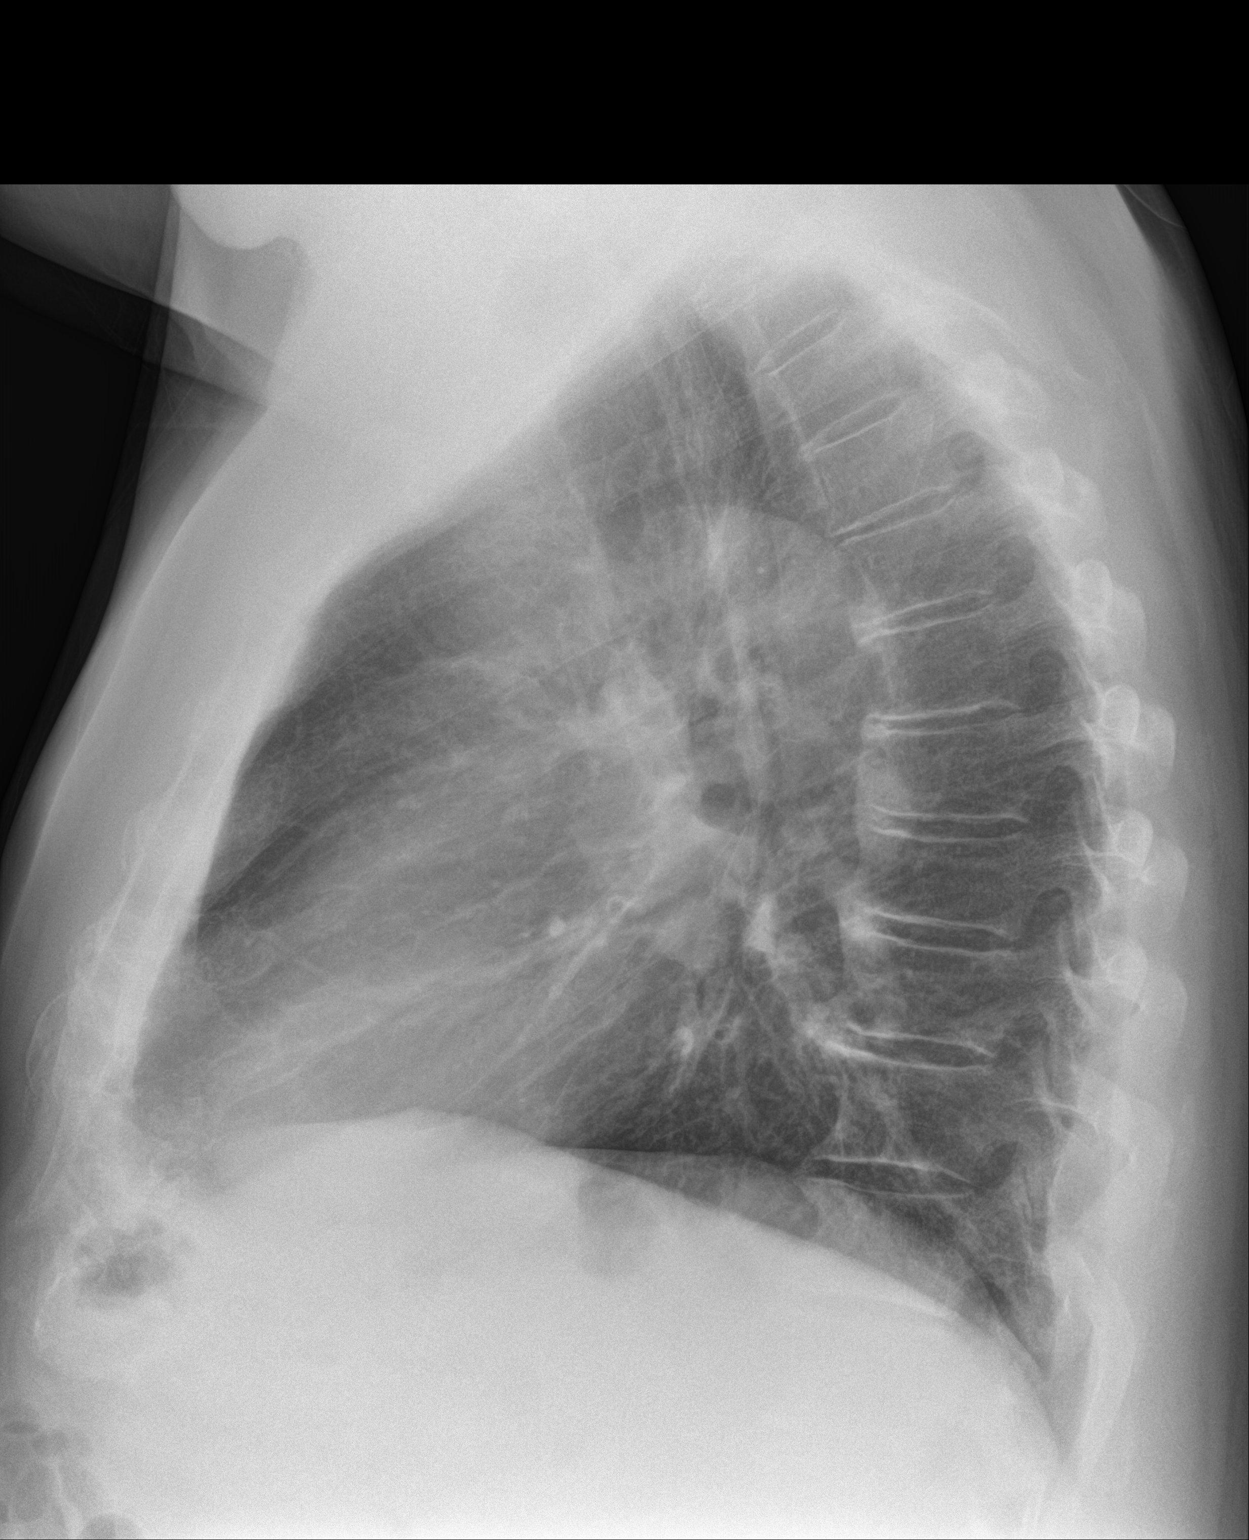

[2 of 2 positions shown; findings below may reference images not displayed]

FINDINGS: The lungs are well-expanded. There is no focal infiltrate. There is
no pleural effusion. The heart and pulmonary vascularity are normal.
The mediastinum is normal in width. The bony thorax exhibits no
acute abnormality. There are degenerative changes of the AC joints
bilaterally.
IMPRESSION: There is no active cardiopulmonary disease.

## 2016-10-03 ENCOUNTER — Ambulatory Visit (INDEPENDENT_AMBULATORY_CARE_PROVIDER_SITE_OTHER): Payer: Medicare Other

## 2016-10-03 DIAGNOSIS — Z86718 Personal history of other venous thrombosis and embolism: Secondary | ICD-10-CM

## 2016-10-03 DIAGNOSIS — Z5181 Encounter for therapeutic drug level monitoring: Secondary | ICD-10-CM | POA: Diagnosis not present

## 2016-10-03 LAB — POCT INR: INR: 2.4

## 2016-10-04 ENCOUNTER — Telehealth: Payer: Self-pay | Admitting: Family Medicine

## 2016-10-04 NOTE — Telephone Encounter (Signed)
Dr. Danise Mina has approved transfer of care for PT from Dr. Deborra Medina to him. He will need an appt to transfer care.

## 2016-10-14 ENCOUNTER — Other Ambulatory Visit: Payer: Self-pay | Admitting: Family Medicine

## 2016-10-14 NOTE — Telephone Encounter (Signed)
Last refill 07/15/16 #30 +2  Last OV 11/12/15 Ok to refill?

## 2016-10-14 NOTE — Telephone Encounter (Signed)
Called in [place], [place]: [phone]

## 2016-11-02 ENCOUNTER — Ambulatory Visit (INDEPENDENT_AMBULATORY_CARE_PROVIDER_SITE_OTHER): Payer: Medicare Other

## 2016-11-02 DIAGNOSIS — Z86718 Personal history of other venous thrombosis and embolism: Secondary | ICD-10-CM

## 2016-11-02 DIAGNOSIS — Z5181 Encounter for therapeutic drug level monitoring: Secondary | ICD-10-CM | POA: Diagnosis not present

## 2016-11-02 LAB — POCT INR: INR: 2.1

## 2016-11-10 ENCOUNTER — Other Ambulatory Visit: Payer: Self-pay | Admitting: Family Medicine

## 2016-11-11 NOTE — Telephone Encounter (Signed)
Called in to pharm/thx dmf

## 2016-11-11 NOTE — Telephone Encounter (Signed)
Requesting: Alprazolam Contract: None UDS: None Last OV: 9.14.2017 Next OV: Not scheduled Last Refill: 9.28.2018   Please advise

## 2016-12-09 ENCOUNTER — Other Ambulatory Visit: Payer: Self-pay | Admitting: Family Medicine

## 2016-12-10 DIAGNOSIS — I4819 Other persistent atrial fibrillation: Secondary | ICD-10-CM | POA: Insufficient documentation

## 2016-12-10 NOTE — Progress Notes (Signed)
Cardiology Office Note  Date:  12/12/2016   ID:  Anthony Sahagun., DOB 28-Jun-1945, MRN [id number]  PCP:  Lucille Passy, MD   Chief Complaint  Patient presents with  . other    58mo f/u. Pt states he is doing well. Reviewed meds with pt verbally.    HPI:  Anthony Kim is a pleasant 71 year old gentleman with a prior history of  chest discomfort,  chest CT scan showing coronary artery calcification,  LAD and left circumflex coronary artery  hyperlipidemia,  hypertension,  anxiety,  stress test 09/03/2012 with no ischemia, normal ejection fraction, exaggerated blood pressure with walking  He does report a history of DVT in the right lower extremity, on chronic warfarin He smoked for 22 years when he was younger who presents for followup  for coronary artery disease, hypertension, persistent atrial fibrillation  On his last clinic visit was in atrial fibrillation, duration unclear Underwent successful cardioversion August 03, 2016 was seen in follow-up September 06, 2016, back in atrial fibrillation He elected for rate control strategy  Denies any significant chest pain, active works on a farm Denies any leg swelling, weight changes, chest pain, shortness of breath Some stress with managing rental properties  EKG on today's visit showing atrial fibrillation with ventricular rate 78 bpm no significant ST or T-wave changes Other past medical history reviewed  Prior CT scan of the chest from 2014   LAD and left circumflex coronary artery disease  He wears compression hose given his history of DVT   PMH:   has a past medical history of Anxiety, CAD (coronary artery disease), Clotting disorder ([place]), Colon polyps, Cough, Depression, DVT (deep venous thrombosis) ([place]), GERD (gastroesophageal reflux disease), Hyperlipidemia, Hypertension, IBS (irritable bowel syndrome), NICM (nonischemic cardiomyopathy) ([place]), Palpitations, Persistent atrial fibrillation ([place]), Pneumonia, Presence of  partial dental prosthetic device, and Seasonal allergies.  PSH:    Past Surgical History:  Procedure Laterality Date  . APPENDECTOMY     1967  . CARDIOVERSION N/A 08/03/2016   Procedure: CARDIOVERSION;  Surgeon: Minna Merritts, MD;  Location: [facility];  Service: Cardiovascular;  Laterality: N/A;  . CATARACT EXTRACTION W/PHACO Right 07/08/2015   Procedure: CATARACT EXTRACTION PHACO AND INTRAOCULAR LENS PLACEMENT ([place]) RIGHT EYE;  Surgeon: Leandrew Koyanagi, MD;  Location: [place];  Service: Ophthalmology;  Laterality: Right;   SYMFONY LENS  . CATARACT EXTRACTION W/PHACO Left 08/05/2015   Procedure: CATARACT EXTRACTION PHACO AND INTRAOCULAR LENS PLACEMENT ([place]) left eye;  Surgeon: Leandrew Koyanagi, MD;  Location: [place];  Service: Ophthalmology;  Laterality: Left;  SYMFONY LENS  . CATARACT EXTRACTION, BILATERAL Bilateral 08/2015  . COLONOSCOPY     x3  . POLYPECTOMY    . TONSILLECTOMY AND ADENOIDECTOMY      Current Outpatient Medications  Medication Sig Dispense Refill  . acetaminophen (TYLENOL) 325 MG tablet Take 325 mg by mouth every 6 (six) hours as needed for pain.     Marland Kitchen ALPRAZOLAM XR 0.5 MG 24 hr tablet TAKE ONE TABLET DAILY. 30 tablet 0  . atorvastatin (LIPITOR) 40 MG tablet Take 1 tablet (40 mg total) by mouth daily. 30 tablet 3  . buPROPion (WELLBUTRIN XL) 150 MG 24 hr tablet Take 1 tablet (150 mg total) by mouth daily. 90 tablet 1  . cloNIDine (CATAPRES) 0.1 MG tablet Take 0.5 tablets (0.05 mg total) by mouth 2 (two) times daily. 60 tablet 6  . colesevelam (WELCHOL) 625 MG tablet Take 625 mg by mouth daily.    Marland Kitchen  ezetimibe (ZETIA) 10 MG tablet Take 1 tablet (10 mg total) by mouth daily. 30 tablet 3  . fenofibrate (TRICOR) 145 MG tablet Take 1 tablet (145 mg total) by mouth daily. 30 tablet 11  . fexofenadine (ALLEGRA) 180 MG tablet Take 180 mg by mouth daily. am    . fluticasone (FLONASE) 50 MCG/ACT nasal spray Place 2 sprays into both nostrils as  needed. Reported on 07/08/2015    . meclizine (ANTIVERT) 25 MG tablet Take 25 mg by mouth 3 (three) times daily as needed for dizziness. Reported on 07/01/2015    . metoprolol tartrate (LOPRESSOR) 25 MG tablet Take 1 tablet (25 mg total) by mouth 2 (two) times daily. 60 tablet 6  . Multiple Vitamin (MULTIVITAMIN PO) Take 1 tablet by mouth daily. pm    . Omega-3 Fatty Acids (FISH OIL) 1000 MG CAPS Take 1 capsule by mouth daily. pm    . omeprazole (PRILOSEC) 40 MG capsule Take 1 capsule (40 mg total) by mouth daily. 30 capsule 2  . warfarin (COUMADIN) 5 MG tablet Take 1 tablet (5 mg total) by mouth as directed. 45 tablet 3   No current facility-administered medications for this visit.      Allergies:   Iodine   Social History:  The patient  reports that he quit smoking about 24 years ago. His smoking use included cigarettes. He has a 30.00 pack-year smoking history. he has never used smokeless tobacco. He reports that he drinks about 7.2 oz of alcohol per week. He reports that he does not use drugs.   Family History:   family history includes Heart attack in his father; Heart disease in his mother; Hypertension in his father and mother; Irritable bowel syndrome in his mother; Prostate cancer in his father.    Review of Systems: Review of Systems  Constitutional: Negative.   Respiratory: Negative.   Cardiovascular: Positive for leg swelling.  Gastrointestinal: Negative.   Musculoskeletal: Negative.   Neurological: Negative.   Psychiatric/Behavioral: Negative.   All other systems reviewed and are negative. Stable review of systems   PHYSICAL EXAM: VS:  BP 118/80 (BP Location: Left Arm, Patient Position: Sitting, Cuff Size: Normal)   Pulse 78   Ht 5\' 11"  (1.803 m)   Wt 238 lb 4 oz (108.1 kg)   BMI 33.23 kg/m  , BMI Body mass index is 33.23 kg/m.  Repeat blood pressure 518A systolic GEN: Well nourished, well developed, in no acute distress, obese  HEENT: normal  Neck: no JVD,  carotid bruits, or masses Cardiac: Irregularly irregular, no murmurs, rubs, or gallops, trace non-pitting edema bilaterally with compression hose in place Respiratory:  clear to auscultation bilaterally, normal work of breathing GI: soft, nontender, nondistended, + BS MS: no deformity or atrophy  Skin: warm and dry, no rash Neuro:  Strength and sensation are intact Psych: euthymic mood, full affect    Recent Labs: 07/25/2016: BUN 14; Creatinine, Ser 1.42; Hemoglobin 14.4; Platelets 180; Potassium 4.7; Sodium 146    Lipid Panel Lab Results  Component Value Date   CHOL 149 09/29/2015   HDL 43.90 09/29/2015   LDLCALC 83 09/29/2015   TRIG 112.0 09/29/2015      Wt Readings from Last 3 Encounters:  12/12/16 238 lb 4 oz (108.1 kg)  09/06/16 241 lb (109.3 kg)  08/03/16 245 lb (111.1 kg)    ASSESSMENT AND PLAN:   Atrial fibrillation,persistent  He is declining repeat cardioversion Feels well in persistent atrial fibrillation Rate well controlled, tolerating anticoagulation  Essential hypertension -  Blood pressure is well controlled on today's visit. No changes made to the medications. Stable  HYPERCHOLESTEROLEMIA Cholesterol is at goal on the current lipid regimen. No changes to the medications were made. Repeat lipid panel through primary care when he goes for annual follow-up We will stop fish oil, fenofibrate He is on Lipitor,  Zetia, WelChol  Localized edema Chronic lower extremity edema, stable  Wears compression hose On warfarin for history of DVT  Obesity We have encouraged continued exercise, careful diet management in an effort to lose weight. Weight stable  Decreased GFR We will avoid diuretics   Total encounter time more than 25 minutes  Greater than 50% was spent in counseling and coordination of care with the patient   Disposition:   F/U  12 months   Orders Placed This Encounter  Procedures  . EKG 12-Lead     Signed, Esmond Plants, M.D.,  Ph.D. 12/12/2016  [place], [place]

## 2016-12-12 ENCOUNTER — Ambulatory Visit (INDEPENDENT_AMBULATORY_CARE_PROVIDER_SITE_OTHER): Payer: Medicare Other

## 2016-12-12 ENCOUNTER — Ambulatory Visit (INDEPENDENT_AMBULATORY_CARE_PROVIDER_SITE_OTHER): Payer: Medicare Other | Admitting: Cardiovascular Disease

## 2016-12-12 ENCOUNTER — Encounter: Payer: Self-pay | Admitting: Cardiovascular Disease

## 2016-12-12 ENCOUNTER — Other Ambulatory Visit: Payer: Self-pay | Admitting: Family Medicine

## 2016-12-12 VITALS — BP 118/80 | HR 78 | Ht 71.0 in | Wt 238.2 lb

## 2016-12-12 DIAGNOSIS — I481 Persistent atrial fibrillation: Secondary | ICD-10-CM

## 2016-12-12 DIAGNOSIS — Z5181 Encounter for therapeutic drug level monitoring: Secondary | ICD-10-CM | POA: Diagnosis not present

## 2016-12-12 DIAGNOSIS — Z86718 Personal history of other venous thrombosis and embolism: Secondary | ICD-10-CM | POA: Diagnosis not present

## 2016-12-12 DIAGNOSIS — I2583 Coronary atherosclerosis due to lipid rich plaque: Secondary | ICD-10-CM

## 2016-12-12 DIAGNOSIS — E7800 Pure hypercholesterolemia, unspecified: Secondary | ICD-10-CM | POA: Diagnosis not present

## 2016-12-12 DIAGNOSIS — I2510 Atherosclerotic heart disease of native coronary artery without angina pectoris: Secondary | ICD-10-CM | POA: Diagnosis not present

## 2016-12-12 DIAGNOSIS — R600 Localized edema: Secondary | ICD-10-CM | POA: Diagnosis not present

## 2016-12-12 DIAGNOSIS — I4819 Other persistent atrial fibrillation: Secondary | ICD-10-CM

## 2016-12-12 DIAGNOSIS — I10 Essential (primary) hypertension: Secondary | ICD-10-CM | POA: Diagnosis not present

## 2016-12-12 LAB — POCT INR: INR: 2.8

## 2016-12-12 NOTE — Telephone Encounter (Signed)
Faxed to pharmacy/thx dmf

## 2016-12-12 NOTE — Patient Instructions (Addendum)
Medication Instructions:   Consider stopping the fish oil and fenofibrate  Check the price of the welchol/colesevelam   Labwork:  No new labs needed  Testing/Procedures:  No further testing at this time   Follow-Up: It was a pleasure seeing you in the office today. Please call us if you have new issues that need to be addressed before your next appt.  [phone]  Your physician wants you to follow-up in: 12 months.  You will receive a reminder letter in the mail two months in advance. If you don't receive a letter, please call our office to schedule the follow-up appointment.  If you need a refill on your cardiac medications before your next appointment, please call your pharmacy.

## 2016-12-12 NOTE — Patient Instructions (Signed)
Continue taking 1 tablet daily.  Re-check in 6 weeks. ?

## 2016-12-12 NOTE — Telephone Encounter (Signed)
Requesting: Alprazolam XR Contract: None UDS: None Last OV: 9.14.2017-with TA; 10.26.2018-with Dr. Lorelei Pont Next OV: Not scheduled Last Refill: 10.26.2018   Please advise

## 2016-12-13 ENCOUNTER — Other Ambulatory Visit: Payer: Self-pay | Admitting: Family Medicine

## 2016-12-13 NOTE — Telephone Encounter (Signed)
I have refaxed this Rx/thx dmf

## 2016-12-14 ENCOUNTER — Other Ambulatory Visit: Payer: Self-pay | Admitting: Family Medicine

## 2017-01-04 ENCOUNTER — Other Ambulatory Visit: Payer: Self-pay | Admitting: Cardiovascular Disease

## 2017-01-04 ENCOUNTER — Other Ambulatory Visit: Payer: Self-pay | Admitting: Family Medicine

## 2017-01-11 NOTE — Telephone Encounter (Signed)
rx faxed to pharmacy

## 2017-01-12 ENCOUNTER — Ambulatory Visit (INDEPENDENT_AMBULATORY_CARE_PROVIDER_SITE_OTHER): Payer: Medicare Other

## 2017-01-12 DIAGNOSIS — Z23 Encounter for immunization: Secondary | ICD-10-CM

## 2017-01-13 ENCOUNTER — Telehealth: Payer: Self-pay | Admitting: Family Medicine

## 2017-01-16 ENCOUNTER — Other Ambulatory Visit: Payer: Self-pay | Admitting: Family Medicine

## 2017-01-16 NOTE — Telephone Encounter (Signed)
[facility] called - they did not receive the fax. Please re-fax to [phone]

## 2017-01-16 NOTE — Telephone Encounter (Signed)
Faxed signed Rx for 30d till seen by new PCP/thx dmf

## 2017-01-18 NOTE — Telephone Encounter (Signed)
Faxed on 12.31.18 then refaxed just now/thx dmf

## 2017-01-18 NOTE — Telephone Encounter (Signed)
Refaxed/thx dmf 

## 2017-01-20 ENCOUNTER — Telehealth: Payer: Self-pay

## 2017-01-20 NOTE — Telephone Encounter (Signed)
S/w Page, Pharmacist at [facility]. She states patient told her he increased his clonidine back to 0.1 mg ( 1 tablet) by mouth twice a day. Patient has been keeping track of BP/HR and it has been running good on this dosage. Patient is running out quicker of medication because he is now taking 1 tablet BID.   At last office visit on 12/12/16 with Dr Rockey Situ, patient reported taking Clonidine 1/2 tablet (0.05 mg) BID.   Routing to Dr Rockey Situ to make sure ok to send in new script for Clonidine 1 tablet (0.1 mg) by mouth twice a day.

## 2017-01-20 NOTE — Telephone Encounter (Signed)
Pharmacist states the dosage needs 1 tablet twice daily. Please call.

## 2017-01-23 ENCOUNTER — Ambulatory Visit (INDEPENDENT_AMBULATORY_CARE_PROVIDER_SITE_OTHER): Payer: Medicare Other

## 2017-01-23 DIAGNOSIS — Z86718 Personal history of other venous thrombosis and embolism: Secondary | ICD-10-CM | POA: Diagnosis not present

## 2017-01-23 DIAGNOSIS — Z5181 Encounter for therapeutic drug level monitoring: Secondary | ICD-10-CM

## 2017-01-23 LAB — POCT INR: INR: 1.8

## 2017-01-23 NOTE — Patient Instructions (Signed)
Please take extra 1/2 tablet tonight and tomorrow, then resume taking 1 tablet daily.   Recheck in 4 weeks.

## 2017-01-24 ENCOUNTER — Encounter: Payer: Self-pay | Admitting: Family Medicine

## 2017-01-24 ENCOUNTER — Ambulatory Visit (INDEPENDENT_AMBULATORY_CARE_PROVIDER_SITE_OTHER): Payer: Medicare Other | Admitting: Family Medicine

## 2017-01-24 VITALS — BP 118/80 | HR 82 | Temp 98.0°F | Wt 236.2 lb

## 2017-01-24 DIAGNOSIS — R944 Abnormal results of kidney function studies: Secondary | ICD-10-CM | POA: Diagnosis not present

## 2017-01-24 DIAGNOSIS — F419 Anxiety disorder, unspecified: Secondary | ICD-10-CM | POA: Diagnosis not present

## 2017-01-24 DIAGNOSIS — I4819 Other persistent atrial fibrillation: Secondary | ICD-10-CM

## 2017-01-24 DIAGNOSIS — I481 Persistent atrial fibrillation: Secondary | ICD-10-CM

## 2017-01-24 DIAGNOSIS — I10 Essential (primary) hypertension: Secondary | ICD-10-CM

## 2017-01-24 DIAGNOSIS — E7800 Pure hypercholesterolemia, unspecified: Secondary | ICD-10-CM | POA: Diagnosis not present

## 2017-01-24 DIAGNOSIS — Z5181 Encounter for therapeutic drug level monitoring: Secondary | ICD-10-CM | POA: Diagnosis not present

## 2017-01-24 LAB — LIPID PANEL
CHOL/HDL RATIO: 3
Cholesterol: 128 mg/dL (ref 0–200)
HDL: 42.8 mg/dL (ref >39.00)
LDL CALC: 51 mg/dL (ref 0–99)
NonHDL: 85.12
TRIGLYCERIDES: 171 mg/dL — AB (ref 0.0–149.0)
VLDL: 34.2 mg/dL (ref 0.0–40.0)

## 2017-01-24 LAB — RENAL FUNCTION PANEL
Albumin: 4.1 g/dL (ref 3.5–5.2)
BUN: 18 mg/dL (ref 6–23)
CHLORIDE: 105 meq/L (ref 96–112)
CO2: 29 meq/L (ref 19–32)
Calcium: 9.2 mg/dL (ref 8.4–10.5)
Creatinine, Ser: 1.12 mg/dL (ref 0.40–1.50)
GFR: 68.39 mL/min (ref >60.00)
Glucose, Bld: 108 mg/dL — ABNORMAL HIGH (ref 70–99)
PHOSPHORUS: 3.5 mg/dL (ref 2.3–4.6)
Potassium: 4.7 mEq/L (ref 3.5–5.1)
Sodium: 140 mEq/L (ref 135–145)

## 2017-01-24 MED ORDER — CLONIDINE HCL 0.1 MG PO TABS: 0.1000 mg | ORAL_TABLET | Freq: Two times a day (BID) | ORAL | 6 refills | Status: DC

## 2017-01-24 NOTE — Assessment & Plan Note (Signed)
Chronic, stable. Continue current regimen. Doing better with recent increase in clonidine to 0.1mg  bid

## 2017-01-24 NOTE — Assessment & Plan Note (Signed)
Chronic. Per cards goal TC <150. Continue current regimen. Update FLP.

## 2017-01-24 NOTE — Progress Notes (Addendum)
BP 118/80 (BP Location: Left Arm, Patient Position: Sitting, Cuff Size: Normal)   Pulse 82   Temp 98 F (36.7 C) (Oral)   Wt 236 lb 4 oz (107.2 kg)   SpO2 97%   BMI 32.95 kg/m    CC: transfer care from Anthony Anthony Kim Subjective:    Patient ID: Anthony Conch., male    DOB: [birth year], 72 y.o.   MRN: [id number]  HPI: Anthony Brigante. is a 72 y.o. male presenting on 01/24/2017 for Transfer from Anthony. Deborra Kim (Was taking 1/2 tab of 0.1 mg tab of clonidine.  Pharmacist gave pt 2 wk trial of 0.1 mg, 1 tab daily.  Says he is doing fine on it. Requests rx per Anthony. Synthia Kim approval.  Also, cardio requests lipid panel for pt today.)   New pt to establish care with me, transfer from Anthony Anthony Kim. Last saw prior PCP 2017. Last wellness visit 09/2015.   Sees Anthony Kim for h/o atrial fibrillation on coumadin - managed by [facility].   Recent episode of elevated blood pressure after shoveling snow. He increased clonidine to 0.1mg  BID. BP has improved on this regimen.   Anxiety - started while looking after his parents. Has been on long term alprazolam XR daily for at least 10 yrs. Has not tried other medication for this.   Relevant past medical, surgical, family and social history reviewed and updated as indicated. Interim medical history since our last visit reviewed. Allergies and medications reviewed and updated. Outpatient Medications Prior to Visit  Medication Sig Dispense Refill  . acetaminophen (TYLENOL) 325 MG tablet Take 325 mg by mouth every 6 (six) hours as needed for pain.     Marland Kitchen ALPRAZOLAM XR 0.5 MG 24 hr tablet TAKE 1 TABLET DAILY. 30 tablet 0  . atorvastatin (LIPITOR) 40 MG tablet Take 1 tablet (40 mg total) by mouth daily. 30 tablet 3  . buPROPion (WELLBUTRIN XL) 150 MG 24 hr tablet Take 1 tablet (150 mg total) by mouth daily. 90 tablet 1  . colesevelam (WELCHOL) 625 MG tablet Take 625 mg by mouth daily.    Marland Kitchen ezetimibe (ZETIA) 10 MG tablet Take 1 tablet (10 mg total) by mouth daily. 30 tablet 3  .  fexofenadine (ALLEGRA) 180 MG tablet Take 180 mg by mouth daily. am    . meclizine (ANTIVERT) 25 MG tablet Take 25 mg by mouth 3 (three) times daily as needed for dizziness. Reported on 07/01/2015    . metoprolol tartrate (LOPRESSOR) 25 MG tablet Take 1 tablet (25 mg total) by mouth 2 (two) times daily. 60 tablet 3  . Multiple Vitamin (MULTIVITAMIN PO) Take 1 tablet by mouth daily. pm    . omeprazole (PRILOSEC) 40 MG capsule Take 1 capsule (40 mg total) by mouth daily. 30 capsule 3  . warfarin (COUMADIN) 5 MG tablet Take 1 tablet (5 mg total) by mouth as directed. 45 tablet 3  . cloNIDine (CATAPRES) 0.1 MG tablet Take 0.5 tablets (0.05 mg total) by mouth 2 (two) times daily. (Patient taking differently: Take 0.1 mg by mouth 2 (two) times daily. ) 60 tablet 6  . fluticasone (FLONASE) 50 MCG/ACT nasal spray Place 2 sprays into both nostrils as needed. Reported on 07/08/2015     No facility-administered medications prior to visit.      Per HPI unless specifically indicated in ROS section below Review of Systems     Objective:    BP 118/80 (BP Location: Left Arm, Patient Position: Sitting, Cuff  Size: Normal)   Pulse 82   Temp 98 F (36.7 C) (Oral)   Wt 236 lb 4 oz (107.2 kg)   SpO2 97%   BMI 32.95 kg/m   Wt Readings from Last 3 Encounters:  01/24/17 236 lb 4 oz (107.2 kg)  12/12/16 238 lb 4 oz (108.1 kg)  09/06/16 241 lb (109.3 kg)    Physical Exam  Constitutional: He appears well-developed and well-nourished. No distress.  HENT:  Mouth/Throat: Oropharynx is clear and moist. No oropharyngeal exudate.  Eyes: Conjunctivae are normal. Pupils are equal, round, and reactive to light.  Neck: Normal range of motion. Neck supple. No thyromegaly present.  Cardiovascular: Normal rate, regular rhythm, normal heart sounds and intact distal pulses.  No murmur heard. Pulmonary/Chest: Effort normal and breath sounds normal. No respiratory distress. He has no wheezes. He has no rales.    Musculoskeletal: He exhibits no edema.  Lymphadenopathy:    He has no cervical adenopathy.  Skin: Skin is warm and dry. No rash noted.  Psychiatric: He has a normal mood and affect.  Nursing note and vitals reviewed.  Results for orders placed or performed in visit on 01/23/17  POCT INR  Result Value Ref Range   INR 1.8       Assessment & Plan:   Problem List Items Addressed This Visit    Anxiety    Chronic issue. Longstanding treatment with xanax XR 0.5mg  daily. Resistant to change. Has not tried [facility] or other anxiety medication. Reviewed pros/cons of controlled substance in the form of benzo. Desires to continue. Has not completed controlled substance agreement. Will update today as well as UDS.       Relevant Orders   Pain Mgmt, Profile 8 w/Conf, U   Essential hypertension    Chronic, stable. Continue current regimen. Doing better with recent increase in clonidine to 0.1mg  bid      Relevant Medications   cloNIDine (CATAPRES) 0.1 MG tablet   HYPERCHOLESTEROLEMIA - Primary    Chronic. Per cards goal TC <150. Continue current regimen. Update FLP.       Relevant Medications   cloNIDine (CATAPRES) 0.1 MG tablet   Other Relevant Orders   Lipid panel   Persistent atrial fibrillation ([place])    Appreciate cards care. On coumadin.      Relevant Medications   cloNIDine (CATAPRES) 0.1 MG tablet   Renal insufficiency    Noted on latest Cr. Previously thought related to NSAIDs. Will update renal panel today.           Follow up plan: Return in about 4 months (around 05/24/2017), or if symptoms worsen or fail to improve, for annual exam, prior fasting for blood work, medicare wellness visit.  Anthony Bush, MD

## 2017-01-24 NOTE — Addendum Note (Signed)
Addended by: Ria Bush on: 01/24/2017 01:48 PM   Modules accepted: Orders

## 2017-01-24 NOTE — Assessment & Plan Note (Signed)
Noted on latest Cr. Previously thought related to NSAIDs. Will update renal panel today.

## 2017-01-24 NOTE — Assessment & Plan Note (Addendum)
Chronic issue. Longstanding treatment with xanax XR 0.5mg  daily. Resistant to change. Has not tried [facility] or other anxiety medication. Reviewed pros/cons of controlled substance in the form of benzo. Desires to continue. Has not completed controlled substance agreement. Will update today as well as UDS.

## 2017-01-24 NOTE — Patient Instructions (Addendum)
I will refill clonidine 0.1mg  twice daily.  Continue current medicines.  Return in 4-6 months for medicare wellness visit with Katha Cabal and follow up with me.  Sign controlled substance agreement, update UDS today.

## 2017-01-24 NOTE — Assessment & Plan Note (Signed)
Appreciate cards care. On coumadin.

## 2017-01-28 ENCOUNTER — Encounter: Payer: Self-pay | Admitting: Family Medicine

## 2017-01-28 DIAGNOSIS — Z79899 Other long term (current) drug therapy: Secondary | ICD-10-CM | POA: Insufficient documentation

## 2017-01-28 LAB — PAIN MGMT, PROFILE 8 W/CONF, U
6 ACETYLMORPHINE: NEGATIVE ng/mL (ref <10)
ALCOHOL METABOLITES: POSITIVE ng/mL — AB (ref <500)
ALPHAHYDROXYALPRAZOLAM: 115 ng/mL — AB (ref <25)
AMPHETAMINES: NEGATIVE ng/mL (ref <500)
Alphahydroxymidazolam: NEGATIVE ng/mL (ref <50)
Alphahydroxytriazolam: NEGATIVE ng/mL (ref <50)
Aminoclonazepam: NEGATIVE ng/mL (ref <25)
BENZODIAZEPINES: POSITIVE ng/mL — AB (ref <100)
Buprenorphine, Urine: NEGATIVE ng/mL (ref <5)
COCAINE METABOLITE: NEGATIVE ng/mL (ref <150)
CREATININE: 189.7 mg/dL
Ethyl Glucuronide (ETG): 48411 ng/mL — ABNORMAL HIGH (ref <500)
Ethyl Sulfate (ETS): 9922 ng/mL — ABNORMAL HIGH (ref <100)
Hydroxyethylflurazepam: NEGATIVE ng/mL (ref <50)
LORAZEPAM: NEGATIVE ng/mL (ref <50)
MDMA: NEGATIVE ng/mL (ref <500)
Marijuana Metabolite: NEGATIVE ng/mL (ref <20)
NORDIAZEPAM: NEGATIVE ng/mL (ref <50)
OPIATES: NEGATIVE ng/mL (ref <100)
Oxazepam: NEGATIVE ng/mL (ref <50)
Oxidant: NEGATIVE ug/mL (ref <200)
Oxycodone: NEGATIVE ng/mL (ref <100)
Temazepam: NEGATIVE ng/mL (ref <50)
pH: 6.06 (ref 4.5–9.0)

## 2017-02-06 ENCOUNTER — Other Ambulatory Visit: Payer: Self-pay | Admitting: Nurse Practitioner

## 2017-02-06 ENCOUNTER — Other Ambulatory Visit: Payer: Self-pay | Admitting: Cardiovascular Disease

## 2017-02-07 NOTE — Telephone Encounter (Signed)
Refill request

## 2017-02-20 ENCOUNTER — Ambulatory Visit (INDEPENDENT_AMBULATORY_CARE_PROVIDER_SITE_OTHER): Payer: Medicare Other

## 2017-02-20 DIAGNOSIS — Z86718 Personal history of other venous thrombosis and embolism: Secondary | ICD-10-CM

## 2017-02-20 DIAGNOSIS — Z5181 Encounter for therapeutic drug level monitoring: Secondary | ICD-10-CM | POA: Diagnosis not present

## 2017-02-20 LAB — POCT INR: INR: 1.7

## 2017-02-20 NOTE — Patient Instructions (Signed)
Please take extra tablet tonight and tomorrow, then start taking 1 tablet daily except 1.5 on Mondays and Fridays.  Recheck in 2 weeks.

## 2017-02-22 ENCOUNTER — Encounter: Payer: Self-pay | Admitting: Gastroenterology

## 2017-02-28 ENCOUNTER — Telehealth: Payer: Self-pay | Admitting: *Deleted

## 2017-02-28 DIAGNOSIS — K6289 Other specified diseases of anus and rectum: Secondary | ICD-10-CM

## 2017-02-28 NOTE — Telephone Encounter (Signed)
referral placed

## 2017-02-28 NOTE — Telephone Encounter (Signed)
Copied from [place]. Topic: Referral - Request >> Feb 28, 2017 11:24 AM Synthia Innocent wrote: Reason for CRM: Requesting referral to see Dr Havery Moros for rectal pain, patient is scheduled for 04/14/17.

## 2017-03-02 NOTE — Telephone Encounter (Signed)
Please review

## 2017-03-03 NOTE — Telephone Encounter (Signed)
Review chart for refill

## 2017-03-05 NOTE — Telephone Encounter (Signed)
Clonidine 0.1 BID should be fine Ok to write for 180 pills, with 3 refills

## 2017-03-06 ENCOUNTER — Other Ambulatory Visit: Payer: Self-pay | Admitting: Family Medicine

## 2017-03-06 ENCOUNTER — Ambulatory Visit (INDEPENDENT_AMBULATORY_CARE_PROVIDER_SITE_OTHER): Payer: Medicare Other

## 2017-03-06 DIAGNOSIS — Z86718 Personal history of other venous thrombosis and embolism: Secondary | ICD-10-CM

## 2017-03-06 DIAGNOSIS — Z5181 Encounter for therapeutic drug level monitoring: Secondary | ICD-10-CM

## 2017-03-06 LAB — POCT INR: INR: 2.1

## 2017-03-06 NOTE — Telephone Encounter (Signed)
Dr Danise Mina has sent in refill for patient on 01/24/17 with refills. Will hold off on sending at this time.

## 2017-03-06 NOTE — Patient Instructions (Signed)
Please continue taking 1 tablet daily except 1.5 on Mondays and Fridays.  Recheck in 3 weeks.

## 2017-03-07 NOTE — Telephone Encounter (Signed)
Pt seen to transfer from Dr Deborra Medina to Dr Danise Mina on 01/24/17;bupropion last refilled # 90 x 1 on 07/19/16 by Dr Deborra Medina; did not see mentioned in 01/24/17 note. Also xanax last refilled # 30 on 01/16/17 by Dr Deborra Medina.Please advise.

## 2017-03-08 NOTE — Telephone Encounter (Signed)
Eprescribed.

## 2017-03-09 ENCOUNTER — Other Ambulatory Visit: Payer: Self-pay | Admitting: Cardiovascular Disease

## 2017-03-09 NOTE — Telephone Encounter (Signed)
Please review for refill, thanks ! 

## 2017-03-24 ENCOUNTER — Other Ambulatory Visit: Payer: Self-pay | Admitting: Cardiovascular Disease

## 2017-03-27 ENCOUNTER — Ambulatory Visit (INDEPENDENT_AMBULATORY_CARE_PROVIDER_SITE_OTHER): Payer: Medicare Other

## 2017-03-27 DIAGNOSIS — Z86718 Personal history of other venous thrombosis and embolism: Secondary | ICD-10-CM

## 2017-03-27 DIAGNOSIS — Z5181 Encounter for therapeutic drug level monitoring: Secondary | ICD-10-CM | POA: Diagnosis not present

## 2017-03-27 LAB — POCT INR: INR: 2.8

## 2017-03-27 NOTE — Patient Instructions (Signed)
Please continue taking 1 tablet daily except 1.5 on Mondays and Fridays.  Recheck in 4 weeks.

## 2017-04-03 ENCOUNTER — Other Ambulatory Visit: Payer: Self-pay | Admitting: Family Medicine

## 2017-04-04 NOTE — Telephone Encounter (Signed)
Last filled:  03/09/17, #30 Last OV:  01/24/17 Next OV:  none

## 2017-04-05 DIAGNOSIS — H26491 Other secondary cataract, right eye: Secondary | ICD-10-CM | POA: Diagnosis not present

## 2017-04-05 NOTE — Telephone Encounter (Signed)
Eprescribed.

## 2017-04-10 ENCOUNTER — Ambulatory Visit: Payer: Medicare Other | Admitting: Gastroenterology

## 2017-04-14 ENCOUNTER — Ambulatory Visit (INDEPENDENT_AMBULATORY_CARE_PROVIDER_SITE_OTHER): Payer: Medicare Other | Admitting: Gastroenterology

## 2017-04-14 ENCOUNTER — Encounter: Payer: Self-pay | Admitting: Gastroenterology

## 2017-04-14 VITALS — BP 126/68 | HR 72 | Ht 71.0 in | Wt 237.0 lb

## 2017-04-14 DIAGNOSIS — K602 Anal fissure, unspecified: Secondary | ICD-10-CM

## 2017-04-14 DIAGNOSIS — K5900 Constipation, unspecified: Secondary | ICD-10-CM | POA: Diagnosis not present

## 2017-04-14 MED ORDER — AMBULATORY NON FORMULARY MEDICATION: 1 refills | Status: DC

## 2017-04-14 NOTE — Progress Notes (Signed)
HPI :  72 y/o male with a history of CAD, DVT, atrial fibrillation on coumadin, here for a new patient visit for rectal pain.  Patient states he developed blood in his stool with severe rectal pain about 3 months ago, when passing hard stool and straining.  He states after that time he had some pretty persistent pain which eventually got better without intervention other than stool softeners.  He states his came pain came back in recent days with another hard bowel movement, although he has had no further blood in his stool.  He said he has straining with stool and is remains somewhat constipated.  He denies any fevers.  He denies any abdominal pains.  He states he previously had baseline softer stools however for the past year has been slightly more constipated.  He is taking Coumadin for history of atrial fibrillation and blood clots.  His last colonoscopy was 2013 was normal and he was told his next colonoscopy should be in 2023.   Colonoscopy 04/14/2011 - normal colon Colonoscopy 03/09/2006 - 27mm polyp - adenoma  Past Medical History:  Diagnosis Date  . Anxiety   . CAD (coronary artery disease)    a. 07/2012 CTA chest - incidental coronary Ca2+ of LAD/LCX; b. 08/2012 Myoview: EF 59%, non ischemia/scar-->Low risk.  . Clotting disorder ([place])   . Colon polyps    2008 Tubular Adenoma  . Cough    chronic  . Deep vein thrombosis (DVT) ([place]) 04/06/2010  . Depression   . DVT (deep venous thrombosis) ([place])    a. RLE DVT ~ 05/2010-->chronic coumadin.  Marland Kitchen GERD (gastroesophageal reflux disease)   . Hyperlipidemia   . Hypertension    controlled on meds  . IBS (irritable bowel syndrome)   . NICM (nonischemic cardiomyopathy) ([place])    a. 06/2016 Echo: EF 45-50%, diff HK, mod dil LA/RA.  Marland Kitchen Palpitations   . Persistent atrial fibrillation ([place])    a. Dx 06/2010 - CHA2DS2VASc = 3-4 (Chronic coumadin); b. 07/2016 s/p DCCV (150J) - initially successful but reverted to Afib.  . Pneumonia    3 wks ago/ no  hospitalization/ no follow up required/ was on antibiotic and steroids/Dr Edilia Bo  . Presence of partial dental prosthetic device    lower right  . Seasonal allergies      Past Surgical History:  Procedure Laterality Date  . APPENDECTOMY     1967  . CARDIOVERSION N/A 08/03/2016   Procedure: CARDIOVERSION;  Surgeon: Minna Merritts, MD;  Location: [facility] ORS;  Service: Cardiovascular;  Laterality: N/A;  . CATARACT EXTRACTION W/PHACO Right 07/08/2015   Procedure: CATARACT EXTRACTION PHACO AND INTRAOCULAR LENS PLACEMENT ([place]) RIGHT EYE;  Surgeon: Leandrew Koyanagi, MD;  Location: [facility];  Service: Ophthalmology;  Laterality: Right;   SYMFONY LENS  . CATARACT EXTRACTION W/PHACO Left 08/05/2015   Procedure: CATARACT EXTRACTION PHACO AND INTRAOCULAR LENS PLACEMENT ([place]) left eye;  Surgeon: Leandrew Koyanagi, MD;  Location: [facility];  Service: Ophthalmology;  Laterality: Left;  SYMFONY LENS  . CATARACT EXTRACTION, BILATERAL Bilateral 08/2015  . COLONOSCOPY     x3  . POLYPECTOMY    . TONSILLECTOMY AND ADENOIDECTOMY     Family History  Problem Relation Age of Onset  . Heart disease Mother   . Hypertension Mother   . Irritable bowel syndrome Mother   . Heart attack Father   . Hypertension Father   . Prostate cancer Father    Social History   Tobacco Use  .  Smoking status: Former Smoker    Packs/day: 1.00    Years: 30.00    Pack years: 30.00    Types: Cigarettes    Last attempt to quit: 01/18/1992    Years since quitting: 25.2  . Smokeless tobacco: Never Used  Substance Use Topics  . Alcohol use: Yes    Alcohol/week: 7.2 oz    Types: 12 Glasses of wine per week    Comment: occ  . Drug use: No   Current Outpatient Medications  Medication Sig Dispense Refill  . acetaminophen (TYLENOL) 325 MG tablet Take 325 mg by mouth every 6 (six) hours as needed for pain.     Marland Kitchen ALPRAZolam (XANAX XR) 0.5 MG 24 hr tablet TAKE 1 TABLET DAILY. 30 tablet 3  .  atorvastatin (LIPITOR) 40 MG tablet Take 1 tablet (40 mg total) by mouth daily. 30 tablet 6  . buPROPion (WELLBUTRIN XL) 150 MG 24 hr tablet Take 1 tablet (150 mg total) by mouth daily. 90 tablet 1  . cloNIDine (CATAPRES) 0.1 MG tablet Take 1 tablet (0.1 mg total) by mouth 2 (two) times daily. 60 tablet 6  . colesevelam (WELCHOL) 625 MG tablet Take 625 mg by mouth daily.    Marland Kitchen ezetimibe (ZETIA) 10 MG tablet Take 1 tablet (10 mg total) by mouth daily. 30 tablet 3  . fexofenadine (ALLEGRA) 180 MG tablet Take 180 mg by mouth daily. am    . fluticasone (FLONASE) 50 MCG/ACT nasal spray Place 2 sprays into both nostrils as needed. Reported on 07/08/2015    . meclizine (ANTIVERT) 25 MG tablet Take 25 mg by mouth 3 (three) times daily as needed for dizziness. Reported on 07/01/2015    . metoprolol tartrate (LOPRESSOR) 25 MG tablet Take 1 tablet (25 mg total) by mouth 2 (two) times daily. 60 tablet 3  . Multiple Vitamin (MULTIVITAMIN PO) Take 1 tablet by mouth daily. pm    . omeprazole (PRILOSEC) 40 MG capsule Take 1 capsule (40 mg total) by mouth daily. 30 capsule 3  . warfarin (COUMADIN) 5 MG tablet Take 1 tablet (5 mg total) by mouth as directed. 45 tablet 0   No current facility-administered medications for this visit.    Allergies  Allergen Reactions  . Iodine Hives    Contrast dye/ betadine ok     Review of Systems: All systems reviewed and negative except where noted in HPI.   Lab Results  Component Value Date   WBC 6.0 07/25/2016   HGB 14.4 07/25/2016   HCT 43.8 07/25/2016   MCV 92 07/25/2016   PLT 180 07/25/2016    Lab Results  Component Value Date   CREATININE 1.12 01/24/2017   BUN 18 01/24/2017   NA 140 01/24/2017   K 4.7 01/24/2017   CL 105 01/24/2017   CO2 29 01/24/2017    Lab Results  Component Value Date   ALT 19 09/29/2015   AST 20 09/29/2015   ALKPHOS 43 09/29/2015   BILITOT 0.4 09/29/2015     Physical Exam: BP 126/68   Pulse 72   Ht 5\' 11"  (1.803 m)   Wt  237 lb (107.5 kg)   BMI 33.05 kg/m  Constitutional: Pleasant,well-developed, male in no acute distress. HEENT: Normocephalic and atraumatic. Conjunctivae are normal. No scleral icterus. Neck supple.  Cardiovascular: Normal rate, regular rhythm.  Pulmonary/chest: Effort normal and breath sounds normal. No wheezing, rales or rhonchi. Abdominal: Soft, protuberant, nontender.. There are no masses palpable.  Rectal exam - posterior midline anal  fissure on external exam, skin tag also noted. Internal DRE not done due to pain Extremities: no edema Lymphadenopathy: No cervical adenopathy noted. Neurological: Alert and oriented to person place and time. Skin: Skin is warm and dry. No rashes noted. Psychiatric: Normal mood and affect. Behavior is normal.   ASSESSMENT AND PLAN: 72 year old male history of atrial fibrillation and DVT on Coumadin, presenting with intermittent rectal bleeding, rectal pain in the setting of constipation.  His rectal examination today shows a posterior midline anal fissure which is the very likely cause of his symptoms.  I counseled him what this was and treatment modalities.  I am recommending he start MiraLAX once daily and titrate up as needed for constipation stools loose.  Recommend he start topical nitroglycerin ointment 0.125% 3 times daily apply 3 times a day.  I counseled him this can take a few weeks to heal up.  If no better following this regimen after a few weeks he should contact me for reassessment.  He is otherwise due for colon cancer screening 2023, pending his symptoms resolve with the recommended regimen as outlined.  [place] Cellar, MD [facility] Pager [phone]  CC: Ria Bush, MD

## 2017-04-14 NOTE — Patient Instructions (Signed)
Normal BMI (Body Mass Index- based on height and weight) is between 23 and 30. Your BMI today is Body mass index is 33.05 kg/m. Marland Kitchen Please consider follow up  regarding your BMI with your Primary Care Provider.  We have sent  medications to your pharmacy for you to pick up at your convenience: We have sent a prescription for nitroglycerin 0.125% gel to [facility]. You should apply a pea size amount to your rectum t.  [facility]'s information is below: Address: [address] [place], [place] [address]  [phone]  *Please DO NOT go directly from our office to pick up this medication! Give the pharmacy 1 day to process the prescription as this is compounded at takes time to make.  Please start Miralax 17gram  Pack OTC daily Please do not strain.

## 2017-04-24 ENCOUNTER — Ambulatory Visit (INDEPENDENT_AMBULATORY_CARE_PROVIDER_SITE_OTHER): Payer: Medicare Other

## 2017-04-24 DIAGNOSIS — Z86718 Personal history of other venous thrombosis and embolism: Secondary | ICD-10-CM | POA: Diagnosis not present

## 2017-04-24 DIAGNOSIS — Z5181 Encounter for therapeutic drug level monitoring: Secondary | ICD-10-CM

## 2017-04-24 LAB — POCT INR: INR: 2.9

## 2017-04-24 NOTE — Patient Instructions (Signed)
Please continue taking 1 tablet daily except 1.5 on Mondays and Fridays.  Recheck in 5 weeks.

## 2017-05-02 DIAGNOSIS — H26491 Other secondary cataract, right eye: Secondary | ICD-10-CM | POA: Diagnosis not present

## 2017-05-03 ENCOUNTER — Other Ambulatory Visit: Payer: Self-pay | Admitting: Family Medicine

## 2017-05-24 DIAGNOSIS — H26492 Other secondary cataract, left eye: Secondary | ICD-10-CM | POA: Diagnosis not present

## 2017-05-29 ENCOUNTER — Ambulatory Visit (INDEPENDENT_AMBULATORY_CARE_PROVIDER_SITE_OTHER): Payer: Medicare Other

## 2017-05-29 DIAGNOSIS — Z86718 Personal history of other venous thrombosis and embolism: Secondary | ICD-10-CM

## 2017-05-29 DIAGNOSIS — Z5181 Encounter for therapeutic drug level monitoring: Secondary | ICD-10-CM

## 2017-05-29 LAB — POCT INR: INR: 2.8

## 2017-05-29 NOTE — Patient Instructions (Signed)
Please continue taking 1 tablet daily except 1.5 on Mondays and Fridays.  Recheck in 6 weeks.  

## 2017-05-31 ENCOUNTER — Other Ambulatory Visit: Payer: Self-pay | Admitting: Cardiovascular Disease

## 2017-06-06 ENCOUNTER — Other Ambulatory Visit: Payer: Self-pay | Admitting: Cardiovascular Disease

## 2017-06-06 NOTE — Telephone Encounter (Signed)
Please review for refill. Thanks!  

## 2017-06-14 DIAGNOSIS — R350 Frequency of micturition: Secondary | ICD-10-CM | POA: Diagnosis not present

## 2017-06-14 DIAGNOSIS — N400 Enlarged prostate without lower urinary tract symptoms: Secondary | ICD-10-CM | POA: Diagnosis not present

## 2017-06-21 ENCOUNTER — Telehealth: Payer: Self-pay

## 2017-06-21 ENCOUNTER — Other Ambulatory Visit: Payer: Self-pay | Admitting: Family Medicine

## 2017-06-21 DIAGNOSIS — Z1159 Encounter for screening for other viral diseases: Secondary | ICD-10-CM

## 2017-06-21 NOTE — Telephone Encounter (Signed)
Order placed for Hep C.

## 2017-06-22 ENCOUNTER — Other Ambulatory Visit: Payer: Self-pay | Admitting: Family Medicine

## 2017-06-22 ENCOUNTER — Ambulatory Visit (INDEPENDENT_AMBULATORY_CARE_PROVIDER_SITE_OTHER): Payer: Medicare Other

## 2017-06-22 VITALS — BP 130/82 | HR 80 | Temp 98.4°F | Ht 70.0 in | Wt 231.5 lb

## 2017-06-22 DIAGNOSIS — E7800 Pure hypercholesterolemia, unspecified: Secondary | ICD-10-CM

## 2017-06-22 DIAGNOSIS — N289 Disorder of kidney and ureter, unspecified: Secondary | ICD-10-CM | POA: Diagnosis not present

## 2017-06-22 DIAGNOSIS — Z0000 Encounter for general adult medical examination without abnormal findings: Secondary | ICD-10-CM | POA: Diagnosis not present

## 2017-06-22 DIAGNOSIS — Z125 Encounter for screening for malignant neoplasm of prostate: Secondary | ICD-10-CM

## 2017-06-22 DIAGNOSIS — Z1159 Encounter for screening for other viral diseases: Secondary | ICD-10-CM

## 2017-06-22 LAB — CBC WITH DIFFERENTIAL/PLATELET
BASOS ABS: 0 10*3/uL (ref 0.0–0.1)
Basophils Relative: 0.7 % (ref 0.0–3.0)
Eosinophils Absolute: 0.1 10*3/uL (ref 0.0–0.7)
Eosinophils Relative: 2 % (ref 0.0–5.0)
HEMATOCRIT: 46.8 % (ref 39.0–52.0)
Hemoglobin: 15.8 g/dL (ref 13.0–17.0)
LYMPHS PCT: 34.3 % (ref 12.0–46.0)
Lymphs Abs: 2.1 10*3/uL (ref 0.7–4.0)
MCHC: 33.7 g/dL (ref 30.0–36.0)
MCV: 94 fl (ref 78.0–100.0)
MONOS PCT: 7 % (ref 3.0–12.0)
Monocytes Absolute: 0.4 10*3/uL (ref 0.1–1.0)
Neutro Abs: 3.5 10*3/uL (ref 1.4–7.7)
Neutrophils Relative %: 56 % (ref 43.0–77.0)
Platelets: 157 10*3/uL (ref 150.0–400.0)
RBC: 4.98 Mil/uL (ref 4.22–5.81)
RDW: 14.3 % (ref 11.5–15.5)
WBC: 6.2 10*3/uL (ref 4.0–10.5)

## 2017-06-22 LAB — COMPREHENSIVE METABOLIC PANEL
ALBUMIN: 4.1 g/dL (ref 3.5–5.2)
ALK PHOS: 73 U/L (ref 39–117)
ALT: 41 U/L (ref 0–53)
AST: 31 U/L (ref 0–37)
BUN: 20 mg/dL (ref 6–23)
CALCIUM: 9.3 mg/dL (ref 8.4–10.5)
CO2: 29 meq/L (ref 19–32)
Chloride: 105 mEq/L (ref 96–112)
Creatinine, Ser: 1.08 mg/dL (ref 0.40–1.50)
GFR: 71.24 mL/min (ref >60.00)
Glucose, Bld: 106 mg/dL — ABNORMAL HIGH (ref 70–99)
Potassium: 4.6 mEq/L (ref 3.5–5.1)
Sodium: 140 mEq/L (ref 135–145)
Total Bilirubin: 1.2 mg/dL (ref 0.2–1.2)
Total Protein: 6.4 g/dL (ref 6.0–8.3)

## 2017-06-22 LAB — PSA, MEDICARE: PSA: 0.49 ng/ml (ref 0.10–4.00)

## 2017-06-22 LAB — LIPID PANEL
CHOL/HDL RATIO: 3
Cholesterol: 130 mg/dL (ref 0–200)
HDL: 43.6 mg/dL (ref >39.00)
LDL Cholesterol: 58 mg/dL (ref 0–99)
NonHDL: 86.38
TRIGLYCERIDES: 141 mg/dL (ref 0.0–149.0)
VLDL: 28.2 mg/dL (ref 0.0–40.0)

## 2017-06-22 NOTE — Progress Notes (Signed)
Subjective:   Anthony Kim. is a 72 y.o. male who presents for Medicare Annual/Subsequent preventive examination.  Review of Systems:  N/A Cardiac Risk Factors include: advanced age (>63men, >61 women);male gender;obesity (BMI >30kg/m2);hypertension;dyslipidemia     Objective:    Vitals: BP 130/82 (BP Location: Right Arm, Patient Position: Sitting, Cuff Size: Normal)   Pulse 80   Temp 98.4 F (36.9 C) (Oral)   Ht 5\' 10"  (1.778 m) Comment: no shoes  Wt 231 lb 8 oz (105 kg)   SpO2 95%   BMI 33.22 kg/m   Body mass index is 33.22 kg/m.  Advanced Directives 06/22/2017 08/03/2016 09/29/2015 08/05/2015 07/08/2015  Does Patient Have a Medical Advance Directive? Yes No Yes Yes Yes  Type of Paramedic of [place];Living will - Healthcare Power of [place];Living will [place];Living will  Does patient want to make changes to medical advance directive? - - No - Patient declined - No - Patient declined  Copy of [place] in Chart? No - copy requested - No - copy requested Yes (No Data)    Tobacco Social History   Tobacco Use  Smoking Status Former Smoker  . Packs/day: 1.00  . Years: 30.00  . Pack years: 30.00  . Types: Cigarettes  . Last attempt to quit: 01/18/1992  . Years since quitting: 25.4  Smokeless Tobacco Never Used     Counseling given: No   Clinical Intake:  Pre-visit preparation completed: Yes  Pain : No/denies pain Pain Score: 0-No pain     Nutritional Status: BMI > 30  Obese Nutritional Risks: None Diabetes: No  How often do you need to have someone help you when you read instructions, pamphlets, or other written materials from your doctor or pharmacy?: 1 - Never What is the last grade level you completed in school?: 12th grade  Interpreter Needed?: No  Comments: pt lives with spouse Information entered by :: LPinson, LPN  Past Medical History:  Diagnosis  Date  . Anxiety   . CAD (coronary artery disease)    a. 07/2012 CTA chest - incidental coronary Ca2+ of LAD/LCX; b. 08/2012 Myoview: EF 59%, non ischemia/scar-->Low risk.  . Clotting disorder ([place])   . Colon polyps    2008 Tubular Adenoma  . Cough    chronic  . Deep vein thrombosis (DVT) ([place]) 04/06/2010  . Depression   . DVT (deep venous thrombosis) ([place])    a. RLE DVT ~ 05/2010-->chronic coumadin.  Marland Kitchen GERD (gastroesophageal reflux disease)   . Hyperlipidemia   . Hypertension    controlled on meds  . IBS (irritable bowel syndrome)   . NICM (nonischemic cardiomyopathy) ([place])    a. 06/2016 Echo: EF 45-50%, diff HK, mod dil LA/RA.  Marland Kitchen Palpitations   . Persistent atrial fibrillation ([place])    a. Dx 06/2010 - CHA2DS2VASc = 3-4 (Chronic coumadin); b. 07/2016 s/p DCCV (150J) - initially successful but reverted to Afib.  . Pneumonia    3 wks ago/ no hospitalization/ no follow up required/ was on antibiotic and steroids/Dr Edilia Bo  . Presence of partial dental prosthetic device    lower right  . Seasonal allergies    Past Surgical History:  Procedure Laterality Date  . APPENDECTOMY     1967  . CARDIOVERSION N/A 08/03/2016   Procedure: CARDIOVERSION;  Surgeon: Minna Merritts, MD;  Location: [facility] ORS;  Service: Cardiovascular;  Laterality: N/A;  . CATARACT EXTRACTION W/PHACO Right 07/08/2015  Procedure: CATARACT EXTRACTION PHACO AND INTRAOCULAR LENS PLACEMENT ([employer]) RIGHT EYE;  Surgeon: Leandrew Koyanagi, MD;  Location: [facility];  Service: Ophthalmology;  Laterality: Right;   SYMFONY LENS  . CATARACT EXTRACTION W/PHACO Left 08/05/2015   Procedure: CATARACT EXTRACTION PHACO AND INTRAOCULAR LENS PLACEMENT ([place]) left eye;  Surgeon: Leandrew Koyanagi, MD;  Location: [facility];  Service: Ophthalmology;  Laterality: Left;  SYMFONY LENS  . CATARACT EXTRACTION, BILATERAL Bilateral 08/2015  . COLONOSCOPY     x3  . POLYPECTOMY    . TONSILLECTOMY AND ADENOIDECTOMY     Family  History  Problem Relation Age of Onset  . Heart disease Mother   . Hypertension Mother   . Irritable bowel syndrome Mother   . Heart attack Father   . Hypertension Father   . Prostate cancer Father    Social History   Socioeconomic History  . Marital status: Married    Spouse name: Not on file  . Number of children: 1  . Years of education: Not on file  . Highest education level: Not on file  Occupational History  . Occupation: Scientist, clinical (histocompatibility and immunogenetics): [employer]  . Financial resource strain: Not on file  . Food insecurity:    Worry: Not on file    Inability: Not on file  . Transportation needs:    Medical: Not on file    Non-medical: Not on file  Tobacco Use  . Smoking status: Former Smoker    Packs/day: 1.00    Years: 30.00    Pack years: 30.00    Types: Cigarettes    Last attempt to quit: 01/18/1992    Years since quitting: 25.4  . Smokeless tobacco: Never Used  Substance and Sexual Activity  . Alcohol use: Yes    Alcohol/week: 4.2 oz    Types: 7 Glasses of wine per week  . Drug use: No  . Sexual activity: Yes  Lifestyle  . Physical activity:    Days per week: Not on file    Minutes per session: Not on file  . Stress: Not on file  Relationships  . Social connections:    Talks on phone: Not on file    Gets together: Not on file    Attends religious service: Not on file    Active member of club or organization: Not on file    Attends meetings of clubs or organizations: Not on file    Relationship status: Not on file  Other Topics Concern  . Not on file  Social History Narrative   Has living will.   Desires CPR.   Would not want life support if recovery futile.  Unsure about feeding tube.    Outpatient Encounter Medications as of 06/22/2017  Medication Sig  . acetaminophen (TYLENOL) 325 MG tablet Take 325 mg by mouth every 6 (six) hours as needed for pain.   Marland Kitchen ALLERGY RELIEF 180 MG tablet TAKE ONE TABLET DAILY FOR CONGESTION.  Marland Kitchen  ALPRAZolam (XANAX XR) 0.5 MG 24 hr tablet TAKE 1 TABLET DAILY.  Marland Kitchen AMBULATORY NON FORMULARY MEDICATION Medication Name:     nitroglycerin 0.125% . You should apply a pea size amount to your rectum every 8 hours  [facility] information is below: Address: [address], [place], [place] [address]  [phone]  *Please DO NOT go directly from our office to pick up this medication! Give the pharmacy 1 day to process the prescription as this is compounded at takes  time to make.  Marland Kitchen atorvastatin (LIPITOR) 40 MG tablet Take 1 tablet (40 mg total) by mouth daily.  Marland Kitchen buPROPion (WELLBUTRIN XL) 150 MG 24 hr tablet Take 1 tablet (150 mg total) by mouth daily.  . cloNIDine (CATAPRES) 0.1 MG tablet Take 1 tablet (0.1 mg total) by mouth 2 (two) times daily.  . colesevelam (WELCHOL) 625 MG tablet Take 625 mg by mouth daily.  Marland Kitchen ezetimibe (ZETIA) 10 MG tablet Take 1 tablet (10 mg total) by mouth daily.  . fluticasone (FLONASE) 50 MCG/ACT nasal spray Place 2 sprays into both nostrils as needed. Reported on 07/08/2015  . meclizine (ANTIVERT) 25 MG tablet Take 25 mg by mouth 3 (three) times daily as needed for dizziness. Reported on 07/01/2015  . metoprolol tartrate (LOPRESSOR) 25 MG tablet Take 1 tablet (25 mg total) by mouth 2 (two) times daily.  . Multiple Vitamin (MULTIVITAMIN PO) Take 1 tablet by mouth daily. pm  . omeprazole (PRILOSEC) 40 MG capsule Take 1 capsule (40 mg total) by mouth daily.  . polyethylene glycol (MIRALAX / GLYCOLAX) packet Take 17 g by mouth daily.  Marland Kitchen warfarin (COUMADIN) 5 MG tablet Take 1 tablet (5 mg total) by mouth as directed.   No facility-administered encounter medications on file as of 06/22/2017.     Activities of Daily Living In your present state of health, do you have any difficulty performing the following activities: 06/22/2017  Hearing? N  Vision? N  Difficulty concentrating or making decisions? N  Walking or climbing stairs? N  Dressing or bathing? N    Doing errands, shopping? N  Preparing Food and eating ? N  Using the Toilet? N  In the past six months, have you accidently leaked urine? N  Do you have problems with loss of bowel control? N  Managing your Medications? N  Managing your Finances? N  Housekeeping or managing your Housekeeping? N  Some recent data might be hidden    Patient Care Team: Ria Bush, MD as PCP - General (Family Medicine) Rockey Situ, Kathlene November, MD as Consulting Physician (Cardiology) Inda Castle, MD (Inactive) as Consulting Physician (Gastroenterology) McDiarmid, Blane Ohara, MD as Consulting Physician (Family Medicine) Bjorn Loser, MD as Consulting Physician (Urology) Philis Kendall, MD as Consulting Physician (Ophthalmology) Gaynelle Arabian, MD as Consulting Physician (Orthopedic Surgery) Leandrew Koyanagi, MD as Referring Physician (Ophthalmology)   Assessment:   This is a routine wellness examination for [place].   Hearing Screening   125Hz  250Hz  500Hz  1000Hz  2000Hz  3000Hz  4000Hz  6000Hz  8000Hz   Right ear:   40 40 40  0    Left ear:   40 40 40  0    Vision Screening Comments: Jun 16, 2017 with Dr. 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        Fall Risk Fall Risk  06/22/2017 06/23/2014 03/22/2012  Falls in the past year? No No No   Depression Screen PHQ 2/9 Scores 06/22/2017 09/29/2015 06/23/2014 03/22/2012  PHQ - 2 Score 0 0 0 0  PHQ- 9 Score 0 - - -    Cognitive Function MMSE - Mini Mental State Exam 06/22/2017 09/29/2015  Orientation to  time 5 5  Orientation to Place 5 5  Registration 3 3  Attention/ Calculation 0 0  Recall  3 3  Language- name 2 objects 0 0  Language- repeat 1 1  Language- follow 3 step command 3 3  Language- read & follow direction 0 0  Write a sentence 0 0  Copy design 0 0  Total score 20 20     PLEASE NOTE: A Mini-Cog screen was completed. Maximum score is 20. A value of 0 denotes this part of [place] MMSE was not completed or the patient failed this part of the Mini-Cog screening.   Mini-Cog Screening Orientation to Time - Max 5 pts Orientation to Place - Max 5 pts Registration - Max 3 pts Recall - Max 3 pts Language Repeat - Max 1 pts Language Follow 3 Step Command - Max 3 pts     Immunization History  Administered Date(s) Administered  . Influenza Split 10/31/2011  . Influenza Whole 10/30/2007, 10/28/2008, 10/27/2009  . Influenza,inj,Quad PF,6+ Mos 11/13/2013, 10/14/2014, 10/01/2015, 01/12/2017  . Pneumococcal Conjugate-13 11/13/2013  . Pneumococcal Polysaccharide-23 03/23/2011  . Td 08/03/2000, 09/16/2008  . Zoster 10/28/2008   Screening Tests Health Maintenance  Topic Date Due  . DTaP/Tdap/Td (1 - Tdap) 09/17/2018 (Originally 09/17/2008)  . INFLUENZA VACCINE  08/17/2017  . TETANUS/TDAP  09/17/2018  . COLONOSCOPY  04/13/2021  . Hepatitis C Screening  Completed  . PNA vac Low Risk Adult  Completed      Plan:     I have personally reviewed, addressed, and noted the following in the patient's chart:  A. Medical and social history B. Use of alcohol, tobacco or illicit drugs  C. Current medications and supplements D. Functional ability and status E.  Nutritional status F.  Physical activity G. Advance directives H. List of other physicians I.  Hospitalizations, surgeries, and ER visits in previous 12 months J.  [place] to include hearing, vision, cognitive, depression L. Referrals and appointments - none  In addition, I have reviewed and discussed with patient certain preventive protocols, quality metrics, and best practice recommendations. A  written personalized care plan for preventive services as well as general preventive health recommendations were provided to patient.  See attached scanned questionnaire for additional information.   Signed,   Lindell Noe, MHA, BS, LPN Health Coach

## 2017-06-22 NOTE — Progress Notes (Signed)
PCP notes:   Health maintenance:  Hep C screening - completed  Abnormal screenings:   Hearing - failed  Hearing Screening   125Hz  250Hz  500Hz  1000Hz  2000Hz  3000Hz  4000Hz  6000Hz  8000Hz   Right ear:   40 40 40  0    Left ear:   40 40 40  0     Patient concerns:   None  Nurse concerns:  None  Next PCP appt:   06/29/2017 @ 1030

## 2017-06-22 NOTE — Patient Instructions (Signed)
[name] , Thank you for taking time to come for your Medicare Wellness Visit. I appreciate your ongoing commitment to your health goals. Please review the following plan we discussed and let me know if I can assist you in the future.   These are the goals we discussed: Goals    . 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        This is a list of the screening recommended for you and due dates:  Health Maintenance  Topic Date Due  . DTaP/Tdap/Td vaccine (1 - Tdap) 09/17/2018*  . Flu Shot  08/17/2017  . Tetanus Vaccine  09/17/2018  . Colon Cancer Screening  04/13/2021  .  Hepatitis C: One time screening is recommended by Center for Disease Control  (CDC) for  adults born from [birth year] through 1966.   Completed  . Pneumonia vaccines  Completed  *Topic was postponed. The date shown is not the original due date.   Preventive Care for Adults  A healthy lifestyle and preventive care can promote health and wellness. Preventive health guidelines for adults include the following key practices.  . A routine yearly physical is a good way to check with your health care provider about your health and preventive screening. It is a chance to share any concerns and updates on your health and to receive a thorough exam.  . Visit your dentist for a routine exam and preventive care every 6 months. Brush your teeth twice a day and floss once a day. Good oral hygiene prevents tooth decay and gum disease.  . The frequency of eye exams is based on your age, health, family medical history, use  of contact lenses, and other factors. Follow your health care provider's recommendations for frequency of eye exams.  . Eat a healthy diet. Foods like vegetables, fruits, whole grains, low-fat dairy products, and lean protein foods contain the nutrients you need without too many calories. Decrease your intake of foods high in  solid fats, added sugars, and salt. Eat the right amount of calories for you. Get information about a proper diet from your health care provider, if necessary.  . Regular physical exercise is one of the most important things you can do for your health. Most adults should get at least 150 minutes of moderate-intensity exercise (any activity that increases your heart rate and causes you to sweat) each week. In addition, most adults need muscle-strengthening exercises on 2 or more days a week.  Silver Sneakers may be a benefit available to you. To determine eligibility, you may visit the website: www.silversneakers.com or contact program at 2012148611 Mon-Fri between 8AM-8PM.   . Maintain a healthy weight. The body mass index (BMI) is a screening tool to identify possible weight problems. It provides an estimate of body fat based on height and weight. Your health care provider can find your BMI and can help you achieve or maintain a healthy weight.   For adults 20 years and older: ? A BMI below 18.5 is considered underweight. ? A BMI of 18.5 to 24.9 is normal. ? A BMI of 25 to 29.9 is considered overweight. ? A BMI of 30 and above is considered obese.   . Maintain normal blood lipids and cholesterol levels by exercising and minimizing your intake of saturated fat. Eat a balanced diet with plenty of fruit and vegetables. Blood tests for lipids and  cholesterol should begin at age 60 and be repeated every 5 years. If your lipid or cholesterol levels are high, you are over 50, or you are at high risk for heart disease, you may need your cholesterol levels checked more frequently. Ongoing high lipid and cholesterol levels should be treated with medicines if diet and exercise are not working.  . If you smoke, find out from your health care provider how to quit. If you do not use tobacco, please do not start.  . If you choose to drink alcohol, please do not consume more than 2 drinks per day. One drink  is considered to be 12 ounces (355 mL) of beer, 5 ounces (148 mL) of wine, or 1.5 ounces (44 mL) of liquor.  . If you are 40-45 years old, ask your health care provider if you should take aspirin to prevent strokes.  . Use sunscreen. Apply sunscreen liberally and repeatedly throughout the day. You should seek shade when your shadow is shorter than you. Protect yourself by wearing long sleeves, pants, a wide-brimmed hat, and sunglasses year round, whenever you are outdoors.  . Once a month, do a whole body skin exam, using a mirror to look at the skin on your back. Tell your health care provider of new moles, moles that have irregular borders, moles that are larger than a pencil eraser, or moles that have changed in shape or color.

## 2017-06-22 NOTE — Progress Notes (Signed)
I reviewed health advisor's note, was available for consultation, and agree with documentation and plan.  

## 2017-06-23 LAB — HEPATITIS C ANTIBODY
HEP C AB: NONREACTIVE
SIGNAL TO CUT-OFF: 0.01 (ref <1.00)

## 2017-06-29 ENCOUNTER — Ambulatory Visit (INDEPENDENT_AMBULATORY_CARE_PROVIDER_SITE_OTHER): Payer: Medicare Other | Admitting: Family Medicine

## 2017-06-29 ENCOUNTER — Encounter: Payer: Self-pay | Admitting: Family Medicine

## 2017-06-29 VITALS — BP 122/80 | HR 70 | Temp 97.8°F | Ht 70.0 in | Wt 237.2 lb

## 2017-06-29 DIAGNOSIS — F331 Major depressive disorder, recurrent, moderate: Secondary | ICD-10-CM | POA: Diagnosis not present

## 2017-06-29 DIAGNOSIS — I10 Essential (primary) hypertension: Secondary | ICD-10-CM | POA: Diagnosis not present

## 2017-06-29 DIAGNOSIS — Z7189 Other specified counseling: Secondary | ICD-10-CM | POA: Insufficient documentation

## 2017-06-29 DIAGNOSIS — I481 Persistent atrial fibrillation: Secondary | ICD-10-CM | POA: Diagnosis not present

## 2017-06-29 DIAGNOSIS — E669 Obesity, unspecified: Secondary | ICD-10-CM | POA: Diagnosis not present

## 2017-06-29 DIAGNOSIS — N289 Disorder of kidney and ureter, unspecified: Secondary | ICD-10-CM | POA: Diagnosis not present

## 2017-06-29 DIAGNOSIS — E7800 Pure hypercholesterolemia, unspecified: Secondary | ICD-10-CM | POA: Diagnosis not present

## 2017-06-29 DIAGNOSIS — I4819 Other persistent atrial fibrillation: Secondary | ICD-10-CM

## 2017-06-29 NOTE — Assessment & Plan Note (Signed)
Advanced directive discussion - wife is HCPOA. Has advanced directives at home - asked to bring [place] copy. Desires CPR. Would not want life support if recovery futile. Unsure about feeding tube.

## 2017-06-29 NOTE — Assessment & Plan Note (Signed)
Chronic, stable on wellbutrin XL and xanax XR.

## 2017-06-29 NOTE — Assessment & Plan Note (Signed)
Reviewed healthy diet and lifestyle choices to affect ongoing sustainable weight loss.

## 2017-06-29 NOTE — Assessment & Plan Note (Signed)
This has improved. Continue to monitor.

## 2017-06-29 NOTE — Progress Notes (Addendum)
BP 122/80 (BP Location: Left Arm, Patient Position: Sitting, Cuff Size: Large)   Pulse 70   Temp 97.8 F (36.6 C) (Oral)   Ht 5\' 10"  (1.778 m)   Wt 237 lb 4 oz (107.6 kg)   SpO2 100%   BMI 34.04 kg/m    CC: AMW f/u visit Subjective:    Patient ID: Anthony Conch., male    DOB: [birth year], 72 y.o.   MRN: [id number]  HPI: Anthony Diliberto. is a 72 y.o. male presenting on 06/29/2017 for Annual Exam (Pt 2.)   Saw Anthony Kim last week for medicare wellness visit. Note reviewed.   Some ongoing L hand tremor.  States 20 lb weight loss with healthy diet changes.   Preventative: Colonoscopy 2013 WNL, rpt 5 yrs --> changed to 10 yrs Anthony Ina) will be due 2023 [name] Prostate cancer screening - sees urology yearly with PSA/DRE (Anthony Kim). Father with h/o prostate cancer Lung cancer screening - not eligible Flu shot -yearly Td 2010 Pneumovax 2013, prevnar 2015 Zostavax - 2010 Shingrix - discussed Advanced directive discussion - wife is HCPOA. Has advanced directives at home - asked to bring [place] copy. Desires CPR. Would not want life support if recovery futile. Unsure about feeding tube. Seat belt use discussed Sunscreen use discussed. No changing moles on skin. Saw dermatologist Ex -smoker - 30+ PY hx, quit cold [place] 1990s Alcohol - 1-2 glasses wine per night Dentist - Q6 mo Eye exam - yearly. Had cataract surgery.   Activity: active on cattle farm Diet: good water, G2 gatorade, fruits/vegetables daily   Relevant past medical, surgical, family and social history reviewed and updated as indicated. Interim medical history since our last visit reviewed. Allergies and medications reviewed and updated. Outpatient Medications Prior to Visit  Medication Sig Dispense Refill  . acetaminophen (TYLENOL) 325 MG tablet Take 325 mg by mouth every 6 (six) hours as needed for pain.     Marland Kitchen ALLERGY RELIEF 180 MG tablet TAKE ONE TABLET DAILY FOR CONGESTION. 30 tablet 0  . ALPRAZolam  (XANAX XR) 0.5 MG 24 hr tablet TAKE 1 TABLET DAILY. 30 tablet 3  . AMBULATORY NON FORMULARY MEDICATION Medication Name:     nitroglycerin 0.125% . You should apply a pea size amount to your rectum every 8 hours  [facility] information is below: Address: [address] [place], [place] [address]  [phone]  *Please DO NOT go directly from our office to pick up this medication! Give the pharmacy 1 day to process the prescription as this is compounded at takes time to make. 30 g 1  . atorvastatin (LIPITOR) 40 MG tablet Take 1 tablet (40 mg total) by mouth daily. 30 tablet 6  . buPROPion (WELLBUTRIN XL) 150 MG 24 hr tablet Take 1 tablet (150 mg total) by mouth daily. 90 tablet 1  . cloNIDine (CATAPRES) 0.1 MG tablet Take 1 tablet (0.1 mg total) by mouth 2 (two) times daily. 60 tablet 6  . colesevelam (WELCHOL) 625 MG tablet Take 625 mg by mouth daily.    Marland Kitchen ezetimibe (ZETIA) 10 MG tablet Take 1 tablet (10 mg total) by mouth daily. 30 tablet 0  . fluticasone (FLONASE) 50 MCG/ACT nasal spray Place 2 sprays into both nostrils as needed. Reported on 07/08/2015    . meclizine (ANTIVERT) 25 MG tablet Take 25 mg by mouth 3 (three) times daily as needed for dizziness. Reported on 07/01/2015    . metoprolol tartrate (LOPRESSOR) 25 MG tablet Take  1 tablet (25 mg total) by mouth 2 (two) times daily. 60 tablet 0  . Multiple Vitamin (MULTIVITAMIN PO) Take 1 tablet by mouth daily. pm    . omeprazole (PRILOSEC) 40 MG capsule Take 1 capsule (40 mg total) by mouth daily. 30 capsule 0  . polyethylene glycol (MIRALAX / GLYCOLAX) packet Take 17 g by mouth daily.    Marland Kitchen warfarin (COUMADIN) 5 MG tablet Take 1 tablet (5 mg total) by mouth as directed. 45 tablet 0   No facility-administered medications prior to visit.      Per HPI unless specifically indicated in ROS section below Review of Systems     Objective:    BP 122/80 (BP Location: Left Arm, Patient Position: Sitting, Cuff Size: Large)    Pulse 70   Temp 97.8 F (36.6 C) (Oral)   Ht 5\' 10"  (1.778 m)   Wt 237 lb 4 oz (107.6 kg)   SpO2 100%   BMI 34.04 kg/m   Wt Readings from Last 3 Encounters:  06/29/17 237 lb 4 oz (107.6 kg)  06/22/17 231 lb 8 oz (105 kg)  04/14/17 237 lb (107.5 kg)    Physical Exam  Constitutional: He is oriented to person, place, and time. He appears well-developed and well-nourished. No distress.  HENT:  Head: Normocephalic and atraumatic.  Right Ear: Hearing, tympanic membrane, external ear and ear canal normal.  Left Ear: Hearing, tympanic membrane, external ear and ear canal normal.  Nose: Nose normal.  Mouth/Throat: Uvula is midline and mucous membranes are normal. No oropharyngeal exudate, posterior oropharyngeal edema or posterior oropharyngeal erythema.  Dry MM  Eyes: Pupils are equal, round, and reactive to light. Conjunctivae and EOM are normal. No scleral icterus.  Neck: Normal range of motion. Neck supple. Carotid bruit is not present. No thyromegaly present.  Cardiovascular: Normal rate, normal heart sounds and intact distal pulses. An irregularly irregular rhythm present.  No murmur heard. Pulses:      Radial pulses are 2+ on the right side, and 2+ on the left side.  Pulmonary/Chest: Effort normal and breath sounds normal. No respiratory distress. He has no wheezes. He has no rales.  Abdominal: Soft. Bowel sounds are normal. He exhibits no distension and no mass. There is no tenderness. There is no rebound and no guarding.  Musculoskeletal: Normal range of motion. He exhibits no edema.  Lymphadenopathy:    He has no cervical adenopathy.  Neurological: He is alert and oriented to person, place, and time.  CN grossly intact, station and gait intact  Skin: Skin is warm and dry. No rash noted.  Psychiatric: He has a normal mood and affect. His behavior is normal. Judgment and thought content normal.  Nursing note and vitals reviewed.  Results for orders placed or performed in visit  on 06/22/17  CBC with Differential/Platelet  Result Value Ref Range   WBC 6.2 4.0 - 10.5 K/uL   RBC 4.98 4.22 - 5.81 Mil/uL   Hemoglobin 15.8 13.0 - 17.0 g/dL   HCT 46.8 39.0 - 52.0 %   MCV 94.0 78.0 - 100.0 fl   MCHC 33.7 30.0 - 36.0 g/dL   RDW 14.3 11.5 - 15.5 %   Platelets 157.0 150.0 - 400.0 K/uL   Neutrophils Relative % 56.0 43.0 - 77.0 %   Lymphocytes Relative 34.3 12.0 - 46.0 %   Monocytes Relative 7.0 3.0 - 12.0 %   Eosinophils Relative 2.0 0.0 - 5.0 %   Basophils Relative 0.7 0.0 - 3.0 %  Neutro Abs 3.5 1.4 - 7.7 K/uL   Lymphs Abs 2.1 0.7 - 4.0 K/uL   Monocytes Absolute 0.4 0.1 - 1.0 K/uL   Eosinophils Absolute 0.1 0.0 - 0.7 K/uL   Basophils Absolute 0.0 0.0 - 0.1 K/uL  PSA, Medicare  Result Value Ref Range   PSA 0.49 0.10 - 4.00 ng/ml  Hepatitis C antibody  Result Value Ref Range   Hepatitis C Ab NON-REACTIVE NON-REACTI   SIGNAL TO CUT-OFF 0.01 <1.00  Lipid panel  Result Value Ref Range   Cholesterol 130 0 - 200 mg/dL   Triglycerides 141.0 0.0 - 149.0 mg/dL   HDL 43.60 >39.00 mg/dL   VLDL 28.2 0.0 - 40.0 mg/dL   LDL Cholesterol 58 0 - 99 mg/dL   Total CHOL/HDL Ratio 3    NonHDL 86.38   Comprehensive metabolic panel  Result Value Ref Range   Sodium 140 135 - 145 mEq/L   Potassium 4.6 3.5 - 5.1 mEq/L   Chloride 105 96 - 112 mEq/L   CO2 29 19 - 32 mEq/L   Glucose, Bld 106 (H) 70 - 99 mg/dL   BUN 20 6 - 23 mg/dL   Creatinine, Ser 1.08 0.40 - 1.50 mg/dL   Total Bilirubin 1.2 0.2 - 1.2 mg/dL   Alkaline Phosphatase 73 39 - 117 U/L   AST 31 0 - 37 U/L   ALT 41 0 - 53 U/L   Total Protein 6.4 6.0 - 8.3 g/dL   Albumin 4.1 3.5 - 5.2 g/dL   Calcium 9.3 8.4 - 10.5 mg/dL   GFR 71.24 >60.00 mL/min      Assessment & Plan:   Problem List Items Addressed This Visit    Renal insufficiency    This has improved. Continue to monitor.       Persistent atrial fibrillation (HCC)    Continue metoprolol and coumadin.  Followed by coumadin cardiology clinic.        Obesity, Class I, BMI 30.0-34.9 (see actual BMI)    Reviewed healthy diet and lifestyle choices to affect ongoing sustainable weight loss.       MDD (major depressive disorder), recurrent episode, moderate (HCC)    Chronic, stable on wellbutrin XL and xanax XR.       HYPERCHOLESTEROLEMIA    Chronic, stable. Continue current regimen of lipitor 40mg  and zeitia and welchol.  The 10-year ASCVD risk score Mikey Bussing DC Brooke Bonito., et al., 2013) is: 19.4%   Values used to calculate the score:     Age: 32 years     Sex: Male     Is Non-Hispanic African American: No     Diabetic: No     Tobacco smoker: No     Systolic Blood Pressure: 416 mmHg     Is BP treated: Yes     HDL Cholesterol: 43.6 mg/dL     Total Cholesterol: 130 mg/dL       Essential hypertension - Primary    Chronic, stable. Continue clonidine and metoprolol.       Advanced care planning/counseling discussion    Advanced directive discussion - wife is HCPOA. Has advanced directives at home - asked to bring [place] copy. Desires CPR. Would not want life support if recovery futile. Unsure about feeding tube.          No orders of the defined types were placed in this encounter.  No orders of the defined types were placed in this encounter.   Follow up plan: Return in about 1 year (  around 06/30/2018) for medicare wellness visit, follow up visit.  Ria Bush, MD

## 2017-06-29 NOTE — Assessment & Plan Note (Signed)
Chronic, stable. Continue current regimen of lipitor 40mg  and zeitia and welchol.  The 10-year ASCVD risk score Mikey Bussing DC Brooke Bonito., et al., 2013) is: 19.4%   Values used to calculate the score:     Age: 72 years     Sex: Male     Is Non-Hispanic African American: No     Diabetic: No     Tobacco smoker: No     Systolic Blood Pressure: 672 mmHg     Is BP treated: Yes     HDL Cholesterol: 43.6 mg/dL     Total Cholesterol: 130 mg/dL

## 2017-06-29 NOTE — Assessment & Plan Note (Signed)
Chronic, stable. Continue clonidine and metoprolol.

## 2017-06-29 NOTE — Assessment & Plan Note (Addendum)
Continue metoprolol and coumadin.  Followed by coumadin cardiology clinic.

## 2017-06-29 NOTE — Patient Instructions (Addendum)
If interested, check with pharmacy about new 2 shot shingles series (shingrix).  Bring me copy of your advance directive at your convenience.  You are doing well today. Continue current medicines. Return as needed or in 1 year for next wellness visit and follow up.

## 2017-07-10 ENCOUNTER — Ambulatory Visit (INDEPENDENT_AMBULATORY_CARE_PROVIDER_SITE_OTHER): Payer: Medicare Other

## 2017-07-10 DIAGNOSIS — Z86718 Personal history of other venous thrombosis and embolism: Secondary | ICD-10-CM | POA: Diagnosis not present

## 2017-07-10 DIAGNOSIS — Z5181 Encounter for therapeutic drug level monitoring: Secondary | ICD-10-CM | POA: Diagnosis not present

## 2017-07-10 LAB — POCT INR: INR: 3 (ref 2.0–3.0)

## 2017-07-10 NOTE — Patient Instructions (Signed)
Please have a serving of greens today and continue taking 1 tablet daily except 1.5 on Mondays and Fridays.  Recheck in 6 weeks.

## 2017-08-06 ENCOUNTER — Other Ambulatory Visit: Payer: Self-pay | Admitting: Cardiovascular Disease

## 2017-08-06 ENCOUNTER — Other Ambulatory Visit: Payer: Self-pay | Admitting: Family Medicine

## 2017-08-07 NOTE — Telephone Encounter (Signed)
Sent. Thanks.   

## 2017-08-07 NOTE — Telephone Encounter (Signed)
JG-Plz see refill req/thx dmf 

## 2017-08-07 NOTE — Telephone Encounter (Signed)
Name of Medication: Alprazolam 0.5mg  I po qd Name of Pharmacy: [facility] or Written Date and Quantity: #30, 3 refills on 04/05/17  Last Office Visit and Type:  Annual Exam on 06/29/17 Next Office Visit and Type:  07/03/18 Last Controlled Substance Agreement Date:  N/A  Last UDS: N/A

## 2017-08-21 ENCOUNTER — Ambulatory Visit (INDEPENDENT_AMBULATORY_CARE_PROVIDER_SITE_OTHER): Payer: Medicare Other

## 2017-08-21 DIAGNOSIS — Z86718 Personal history of other venous thrombosis and embolism: Secondary | ICD-10-CM | POA: Diagnosis not present

## 2017-08-21 DIAGNOSIS — Z5181 Encounter for therapeutic drug level monitoring: Secondary | ICD-10-CM

## 2017-08-21 LAB — POCT INR: INR: 2.3 (ref 2.0–3.0)

## 2017-08-21 NOTE — Patient Instructions (Signed)
Please continue taking 1 tablet daily except 1.5 on Mondays and Fridays.  Recheck in 6 weeks.  

## 2017-09-08 ENCOUNTER — Other Ambulatory Visit: Payer: Self-pay | Admitting: Family Medicine

## 2017-09-08 ENCOUNTER — Other Ambulatory Visit: Payer: Self-pay | Admitting: Cardiovascular Disease

## 2017-09-08 NOTE — Telephone Encounter (Signed)
Please review for refill, Thanks !  

## 2017-09-08 NOTE — Telephone Encounter (Signed)
Electronic refill request. Alprazolam Last office visit:   06/29/17 Last Filled:   30 tablet 0 08/07/2017  Please advise.

## 2017-09-10 NOTE — Telephone Encounter (Signed)
Eprescribed.

## 2017-10-02 ENCOUNTER — Ambulatory Visit (INDEPENDENT_AMBULATORY_CARE_PROVIDER_SITE_OTHER): Payer: Medicare Other

## 2017-10-02 DIAGNOSIS — Z5181 Encounter for therapeutic drug level monitoring: Secondary | ICD-10-CM | POA: Diagnosis not present

## 2017-10-02 DIAGNOSIS — Z86718 Personal history of other venous thrombosis and embolism: Secondary | ICD-10-CM | POA: Diagnosis not present

## 2017-10-02 LAB — POCT INR: INR: 2.9 (ref 2.0–3.0)

## 2017-10-02 NOTE — Patient Instructions (Signed)
Please continue taking 1 tablet daily except 1.5 on Mondays and Fridays.  Recheck in 6 weeks.  

## 2017-10-09 ENCOUNTER — Other Ambulatory Visit: Payer: Self-pay | Admitting: *Deleted

## 2017-10-09 ENCOUNTER — Telehealth: Payer: Self-pay | Admitting: Cardiovascular Disease

## 2017-10-09 MED ORDER — CLONIDINE HCL 0.1 MG PO TABS: 0.1000 mg | ORAL_TABLET | Freq: Two times a day (BID) | ORAL | 3 refills | Status: DC

## 2017-10-09 NOTE — Telephone Encounter (Signed)
I spoke with pt and he is currently taking Clonidine 0.1 mg tablet 1 tablet BID.  Pt mentioned that he has been taking medication with those instructions for some time now and would like it to be refilled.  Pt's clonidine last filled by PCP. Please advise if ok to refill medication.

## 2017-10-09 NOTE — Telephone Encounter (Signed)
Pharmacy calling asking for a call back  stating patient is needing another refill on Clonidine  But patient is confused on the dosing on it Would need a call back to verify this

## 2017-10-09 NOTE — Telephone Encounter (Signed)
Refill sent in for patient.  °

## 2017-11-07 ENCOUNTER — Other Ambulatory Visit: Payer: Self-pay | Admitting: Cardiovascular Disease

## 2017-11-09 ENCOUNTER — Ambulatory Visit (INDEPENDENT_AMBULATORY_CARE_PROVIDER_SITE_OTHER): Payer: Medicare Other

## 2017-11-09 DIAGNOSIS — Z23 Encounter for immunization: Secondary | ICD-10-CM | POA: Diagnosis not present

## 2017-11-13 ENCOUNTER — Ambulatory Visit (INDEPENDENT_AMBULATORY_CARE_PROVIDER_SITE_OTHER): Payer: Medicare Other

## 2017-11-13 DIAGNOSIS — Z5181 Encounter for therapeutic drug level monitoring: Secondary | ICD-10-CM | POA: Diagnosis not present

## 2017-11-13 DIAGNOSIS — Z86718 Personal history of other venous thrombosis and embolism: Secondary | ICD-10-CM | POA: Diagnosis not present

## 2017-11-13 LAB — POCT INR: INR: 2.1 (ref 2.0–3.0)

## 2017-11-13 NOTE — Patient Instructions (Signed)
Please continue taking 1 tablet daily except 1.5 on Mondays and Fridays.  Recheck in 6 weeks.  

## 2017-11-27 ENCOUNTER — Other Ambulatory Visit: Payer: Self-pay

## 2017-11-27 MED ORDER — WARFARIN SODIUM 5 MG PO TABS: ORAL_TABLET | ORAL | 1 refills | Status: DC

## 2017-12-04 ENCOUNTER — Other Ambulatory Visit: Payer: Self-pay | Admitting: Cardiovascular Disease

## 2017-12-25 ENCOUNTER — Ambulatory Visit (INDEPENDENT_AMBULATORY_CARE_PROVIDER_SITE_OTHER): Payer: Medicare Other

## 2017-12-25 DIAGNOSIS — Z5181 Encounter for therapeutic drug level monitoring: Secondary | ICD-10-CM

## 2017-12-25 DIAGNOSIS — Z86718 Personal history of other venous thrombosis and embolism: Secondary | ICD-10-CM | POA: Diagnosis not present

## 2017-12-25 LAB — POCT INR: INR: 2.8 (ref 2.0–3.0)

## 2017-12-25 NOTE — Patient Instructions (Signed)
Please continue taking 1 tablet daily except 1.5 on Mondays and Fridays.  Recheck in 6 weeks.  

## 2018-01-02 ENCOUNTER — Other Ambulatory Visit: Payer: Self-pay | Admitting: Family Medicine

## 2018-01-02 ENCOUNTER — Other Ambulatory Visit: Payer: Self-pay | Admitting: Cardiovascular Disease

## 2018-01-02 NOTE — Telephone Encounter (Signed)
Name of Medication: Alprazolam Name of Pharmacy: [facility] or Written Date and Quantity: 12/06/17, #30/0 Last Office Visit and Type: 06/29/17, f/u Next Office Visit and Type: 07/03/18, CPE Last Controlled Substance Agreement Date: 01/24/17 Last UDS: 01/24/17

## 2018-01-03 NOTE — Telephone Encounter (Signed)
Eprescribed.

## 2018-01-29 ENCOUNTER — Other Ambulatory Visit: Payer: Self-pay | Admitting: Cardiovascular Disease

## 2018-02-05 ENCOUNTER — Ambulatory Visit (INDEPENDENT_AMBULATORY_CARE_PROVIDER_SITE_OTHER): Payer: Medicare Other

## 2018-02-05 DIAGNOSIS — Z86718 Personal history of other venous thrombosis and embolism: Secondary | ICD-10-CM

## 2018-02-05 DIAGNOSIS — Z5181 Encounter for therapeutic drug level monitoring: Secondary | ICD-10-CM

## 2018-02-05 LAB — POCT INR: INR: 2.2 (ref 2.0–3.0)

## 2018-02-05 NOTE — Patient Instructions (Signed)
Please continue taking 1 tablet daily except 1.5 on Mondays and Fridays.  Recheck in 6 weeks.  

## 2018-02-20 NOTE — Progress Notes (Signed)
Cardiology Office Note  Date:  02/22/2018   ID:  Chistian Kim., DOB 04-Mar-1945, MRN [id number]  PCP:  Ria Bush, MD   Chief Complaint  Patient presents with  . other    12 mo  follow up. Medications reviewed verbally.     HPI:  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, on chronic warfarin He smoked for 22 years when he was younger persistent atrial fibrillation, successful cardioversion  who presents for followup  for coronary artery disease, hypertension, persistent atrial fibrillation  Permanent atrial fibrillation starting back in 2018 Seen in clinic 2018 underwent cardioversion August 03, 2016 converted to normal sinus rhythm but back into atrial fibrillation September 06, 2016 He elected for rate control strategy Reports he is asymptomatic  In atrial fib today Does not check his heart rate or blood pressure at home  Previous discussions concerning changing to a NOAC Feels comfortable on the warfarin  Active on the farm, 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, no shortness of breath PND orthopnea Some stress with managing rental properties  EKG personally reviewed by myself on todays visit Shows atrial fibrillation rate 76 bpm no significant ST or T wave changes  Other past medical history reviewed Prior CT scan of the chest from 2014   LAD and left circumflex coronary artery disease  He wears compression hose given his history of DVT   PMH:   has a past medical history of Anxiety, CAD (coronary artery disease), Clotting disorder ([place]), Colon polyps, Cough, Deep vein thrombosis (DVT) ([place]) (04/06/2010), Depression, DVT (deep venous thrombosis)  ([place]), GERD (gastroesophageal reflux disease), Hyperlipidemia, Hypertension, IBS (irritable bowel syndrome), NICM (nonischemic cardiomyopathy) ([place]), Palpitations, Persistent atrial fibrillation, Pneumonia, Presence of partial dental prosthetic device, and Seasonal allergies.  PSH:    Past Surgical History:  Procedure Laterality Date  . APPENDECTOMY     1967  . CARDIOVERSION N/A 08/03/2016   Procedure: CARDIOVERSION;  Surgeon: Minna Merritts, MD;  Location: [facility];  Service: Cardiovascular;  Laterality: N/A;  . CATARACT EXTRACTION W/PHACO Right 07/08/2015   Procedure: CATARACT EXTRACTION PHACO AND INTRAOCULAR LENS PLACEMENT ([place]) RIGHT EYE;  Surgeon: Leandrew Koyanagi, MD;  Location: [facility];  Service: Ophthalmology;  Laterality: Right;   SYMFONY LENS  . CATARACT EXTRACTION W/PHACO Left 08/05/2015   Procedure: CATARACT EXTRACTION PHACO AND INTRAOCULAR LENS PLACEMENT ([place]) left eye;  Surgeon: Leandrew Koyanagi, MD;  Location: [facility];  Service: Ophthalmology;  Laterality: Left;  SYMFONY LENS  . CATARACT EXTRACTION, BILATERAL Bilateral 08/2015  . COLONOSCOPY     x3  . POLYPECTOMY    . TONSILLECTOMY AND ADENOIDECTOMY      Current Outpatient Medications  Medication Sig Dispense Refill  . acetaminophen (TYLENOL) 325 MG tablet Take 325 mg by mouth every 6 (six) hours as needed for pain.     Marland Kitchen ALLERGY RELIEF 180 MG tablet TAKE ONE TABLET DAILY FOR CONGESTION. 30 tablet 6  . ALPRAZolam (XANAX XR) 0.5 MG 24 hr tablet TAKE 1 TABLET DAILY. 30 tablet 1  . AMBULATORY NON FORMULARY MEDICATION Medication Name:     nitroglycerin 0.125% . You should apply a pea size amount to your rectum every 8 hours  [facility]'s information is below: Address: [address] [place], [place] [address]  [phone]  *Please DO NOT go directly from our office to pick up this medication! Give the pharmacy 1 day to process the prescription as this is compounded at takes  time to make. 30 g 1  . atorvastatin (LIPITOR) 40 MG tablet Take 1 tablet (40 mg total) by mouth daily. 30 tablet 0  . buPROPion (WELLBUTRIN XL) 150 MG 24 hr tablet Take 1 tablet (150 mg total) by mouth daily. 90 tablet 3  . cloNIDine (CATAPRES) 0.1 MG tablet Take 1 tablet (0.1 mg total) by mouth 2 (two) times daily. 180 tablet 3  . colesevelam (WELCHOL) 625 MG tablet Take 625 mg by mouth daily.    Marland Kitchen ezetimibe (ZETIA) 10 MG tablet Take 1 tablet (10 mg total) by mouth daily. 30 tablet 0  . fluticasone (FLONASE) 50 MCG/ACT nasal spray Place 2 sprays into both nostrils as needed. Reported on 07/08/2015    . meclizine (ANTIVERT) 25 MG tablet Take 25 mg by mouth 3 (three) times daily as needed for dizziness. Reported on 07/01/2015    . metoprolol tartrate (LOPRESSOR) 25 MG tablet Take 1 tablet (25 mg total) by mouth 2 (two) times daily. 60 tablet 0  . Multiple Vitamin (MULTIVITAMIN PO) Take 1 tablet by mouth daily. pm    . omeprazole (PRILOSEC) 40 MG capsule Take 1 capsule (40 mg total) by mouth daily. 30 capsule 0  . polyethylene glycol (MIRALAX / GLYCOLAX) packet Take 17 g by mouth daily.    Marland Kitchen warfarin (COUMADIN) 5 MG tablet Take as directed by the coumadin clinic. 35 tablet 1   No current facility-administered medications for this visit.      Allergies:   Iodine   Social History:  The patient  reports that he quit smoking about 26 years ago. His smoking use included cigarettes. He has a 30.00 pack-year smoking history. He has never used smokeless tobacco. He reports current alcohol use of about 7.0 standard drinks of alcohol per week. He reports that he does not use drugs.   Family History:   family history includes Heart attack in his father; Heart disease in his mother; Hypertension in his father and mother; Irritable bowel syndrome in his mother; Prostate cancer in his father.    Review of Systems: Review of Systems  Constitutional: Negative.   Respiratory: Negative.   Cardiovascular:  Positive for leg swelling.  Gastrointestinal: Negative.   Musculoskeletal: Negative.   Neurological: Negative.   Psychiatric/Behavioral: Negative.   All other systems reviewed and are negative.   PHYSICAL EXAM: VS:  BP 138/68 (BP Location: Left Arm, Patient Position: Sitting, Cuff Size: Normal)   Pulse 76   Ht 5\' 11"  (1.803 m)   Wt 242 lb (109.8 kg)   BMI 33.75 kg/m  , BMI Body mass index is 33.75 kg/m.  Constitutional:  oriented to person, place, and time. No distress.  HENT:  Head: Grossly normal Eyes:  no discharge. No scleral icterus.  Neck: No JVD, no carotid bruits  Cardiovascular: Regular rate and rhythm, no murmurs appreciated Pulmonary/Chest: Clear to auscultation bilaterally, no wheezes or rails Abdominal: Soft.  no distension.  no tenderness.  Musculoskeletal: Normal range of motion Neurological:  normal muscle tone. Coordination normal. No atrophy Skin: Skin warm and dry Psychiatric: normal affect, pleasant   Recent Labs: 06/22/2017: ALT 41; BUN 20; Creatinine, Ser 1.08; Hemoglobin 15.8; Platelets 157.0; Potassium 4.6; Sodium 140    Lipid  Panel Lab Results  Component Value Date   CHOL 130 06/22/2017   HDL 43.60 06/22/2017   LDLCALC 58 06/22/2017   TRIG 141.0 06/22/2017    Wt Readings from Last 3 Encounters:  02/22/18 242 lb (109.8 kg)  06/29/17 237 lb 4 oz (107.6 kg)  06/22/17 231 lb 8 oz (105 kg)    ASSESSMENT AND PLAN:   Atrial fibrillation,persistent  Previously declined repeat cardioversion Asymptomatic atrial fibrillation Rate well controlled, tolerating anticoagulation No changes made We did discuss increasing metoprolol if rate runs high with exertion He does not want a NOAC  Essential hypertension -  Blood pressure relatively well controlled, no changes made  HYPERCHOLESTEROLEMIA Cholesterol at goal  on Lipitor,  Zetia No changes made  Localized edema Chronic lower extremity edema, stable  On warfarin for history of DVT He wears  compression hose  History of DVT On anticoagulation  Obesity Weight trending upwards, recommended low carbohydrate diet    Total encounter time more than 25 minutes  Greater than 50% was spent in counseling and coordination of care with the patient   Disposition:   F/U  12 months   Orders Placed This Encounter  Procedures  . EKG 12-Lead     Signed, Esmond Plants, M.D., Ph.D. 02/22/2018  [place], [place]

## 2018-02-22 ENCOUNTER — Encounter: Payer: Self-pay | Admitting: Cardiovascular Disease

## 2018-02-22 ENCOUNTER — Ambulatory Visit: Payer: Medicare Other | Admitting: Cardiovascular Disease

## 2018-02-22 ENCOUNTER — Ambulatory Visit (INDEPENDENT_AMBULATORY_CARE_PROVIDER_SITE_OTHER): Payer: Medicare Other | Admitting: Cardiovascular Disease

## 2018-02-22 VITALS — BP 138/68 | HR 76 | Ht 71.0 in | Wt 242.0 lb

## 2018-02-22 DIAGNOSIS — I10 Essential (primary) hypertension: Secondary | ICD-10-CM | POA: Diagnosis not present

## 2018-02-22 DIAGNOSIS — I82491 Acute embolism and thrombosis of other specified deep vein of right lower extremity: Secondary | ICD-10-CM | POA: Diagnosis not present

## 2018-02-22 DIAGNOSIS — E7800 Pure hypercholesterolemia, unspecified: Secondary | ICD-10-CM | POA: Diagnosis not present

## 2018-02-22 DIAGNOSIS — I4891 Unspecified atrial fibrillation: Secondary | ICD-10-CM | POA: Diagnosis not present

## 2018-02-22 MED ORDER — EZETIMIBE 10 MG PO TABS: ORAL_TABLET | ORAL | 3 refills | Status: DC

## 2018-02-22 MED ORDER — METOPROLOL TARTRATE 25 MG PO TABS: 25.0000 mg | ORAL_TABLET | Freq: Two times a day (BID) | ORAL | 3 refills | Status: DC

## 2018-02-22 MED ORDER — CLONIDINE HCL 0.1 MG PO TABS: 0.1000 mg | ORAL_TABLET | Freq: Two times a day (BID) | ORAL | 3 refills | Status: DC

## 2018-02-22 MED ORDER — ATORVASTATIN CALCIUM 40 MG PO TABS: 40.0000 mg | ORAL_TABLET | Freq: Every day | ORAL | 3 refills | Status: DC

## 2018-02-22 NOTE — Patient Instructions (Addendum)
We will refill your meds  Medication Instructions:  No changes  If you need a refill on your cardiac medications before your next appointment, please call your pharmacy.    Lab work: No new labs needed   If you have labs (blood work) drawn today and your tests are completely normal, you will receive your results only by: Marland Kitchen MyChart Message (if you have MyChart) OR . A paper copy in the mail If you have any lab test that is abnormal or we need to change your treatment, we will call you to review the results.   Testing/Procedures: No new testing needed   Follow-Up: At [facility], you and your health needs are our priority.  As part of our continuing mission to provide you with exceptional heart care, we have created designated Provider Care Teams.  These Care Teams include your primary Cardiologist (physician) and Advanced Practice Providers (APPs -  Physician Assistants and Nurse Practitioners) who all work together to provide you with the care you need, when you need it.  . You will need a follow up appointment in 12 months .   Please call our office 2 months in advance to schedule this appointment.    . Providers on your designated Care Team:   . Murray Hodgkins, NP . Christell Faith, PA-C . Marrianne Mood, PA-C  Any Other Special Instructions Will Be Listed Below (If Applicable).  For educational health videos Log in to : [email or web address] Or : [email or web address], password : [email or web address]

## 2018-02-26 ENCOUNTER — Other Ambulatory Visit: Payer: Self-pay | Admitting: Family Medicine

## 2018-02-26 ENCOUNTER — Other Ambulatory Visit: Payer: Self-pay | Admitting: Cardiovascular Disease

## 2018-02-26 NOTE — Telephone Encounter (Signed)
Will you address in Dr. Synthia Innocent absence?  Name of Medication: Alprazolam Name of Pharmacy: [facility] or Written Date and Quantity: 01/30/18, #30 Last Office Visit and Type: 06/29/17, f/u Next Office Visit and Type: 07/03/18, CPE Last Controlled Substance Agreement Date: 02/03/17 Last UDS: 02/03/17

## 2018-02-27 NOTE — Telephone Encounter (Signed)
Sent. Thanks.   

## 2018-03-13 ENCOUNTER — Other Ambulatory Visit: Payer: Self-pay | Admitting: Cardiovascular Disease

## 2018-03-13 NOTE — Telephone Encounter (Signed)
Please review for refill, Thanks !  

## 2018-03-15 ENCOUNTER — Other Ambulatory Visit: Payer: Self-pay | Admitting: Family Medicine

## 2018-03-19 ENCOUNTER — Ambulatory Visit (INDEPENDENT_AMBULATORY_CARE_PROVIDER_SITE_OTHER): Payer: Medicare Other

## 2018-03-19 DIAGNOSIS — Z86718 Personal history of other venous thrombosis and embolism: Secondary | ICD-10-CM

## 2018-03-19 DIAGNOSIS — Z5181 Encounter for therapeutic drug level monitoring: Secondary | ICD-10-CM | POA: Diagnosis not present

## 2018-03-19 LAB — POCT INR: INR: 2.8 (ref 2.0–3.0)

## 2018-03-19 NOTE — Patient Instructions (Signed)
Please continue taking 1 tablet daily except 1.5 on Mondays and Fridays.  Recheck in 6 weeks.  

## 2018-03-26 ENCOUNTER — Other Ambulatory Visit: Payer: Self-pay | Admitting: Family Medicine

## 2018-03-26 NOTE — Telephone Encounter (Addendum)
Electronic refill request. Alprazolam Last office visit:   06/29/2017 Last Filled:    30 tablet 0 02/27/2018  Please advise.

## 2018-03-27 NOTE — Telephone Encounter (Signed)
Eprescribed.

## 2018-04-17 ENCOUNTER — Telehealth: Payer: Self-pay

## 2018-04-18 MED ORDER — WARFARIN SODIUM 5 MG PO TABS: ORAL_TABLET | ORAL | 0 refills | Status: DC

## 2018-04-18 NOTE — Telephone Encounter (Signed)
  Please review . Directions change request from [employer]. Requesting Coumadin 5 mg 1 and 1/2 tab daily.

## 2018-04-18 NOTE — Telephone Encounter (Addendum)
Spoke w/ pharmacist, he requests rx read "up to 1.5 tablets per day as directed by the coumadin clinic. I am sending in refill for coumadin 5 mg #45 w/ 0 refills. Asked him to call back w/ any further questions or concerns.

## 2018-04-18 NOTE — Addendum Note (Signed)
Addended by: Dede Query R on: 04/18/2018 08:22 AM   Modules accepted: Orders

## 2018-04-18 NOTE — Addendum Note (Signed)
Addended by: Stana Bunting on: 04/18/2018 08:39 AM   Modules accepted: Orders

## 2018-04-23 ENCOUNTER — Other Ambulatory Visit: Payer: Self-pay | Admitting: Family Medicine

## 2018-04-23 NOTE — Telephone Encounter (Signed)
Name of Medication: Xanax Name of Pharmacy: [facility] or Written Date and Quantity: 03/27/18, #30 Last Office Visit and Type: 06/29/17, f/u Next Office Visit and Type: 07/03/18, CPE Pt 2 Last Controlled Substance Agreement Date: 02/03/17 Last UDS: 02/03/17

## 2018-04-24 NOTE — Telephone Encounter (Signed)
Eprescribed.

## 2018-04-27 ENCOUNTER — Telehealth: Payer: Self-pay

## 2018-04-27 NOTE — Telephone Encounter (Signed)

## 2018-04-30 ENCOUNTER — Ambulatory Visit (INDEPENDENT_AMBULATORY_CARE_PROVIDER_SITE_OTHER): Payer: Medicare Other

## 2018-04-30 DIAGNOSIS — Z5181 Encounter for therapeutic drug level monitoring: Secondary | ICD-10-CM

## 2018-04-30 DIAGNOSIS — Z86718 Personal history of other venous thrombosis and embolism: Secondary | ICD-10-CM | POA: Diagnosis not present

## 2018-04-30 LAB — POCT INR: INR: 3.6 — AB (ref 2.0–3.0)

## 2018-04-30 NOTE — Patient Instructions (Signed)
Please skip coumadin tonight, then continue taking 1 tablet daily except 1.5 on Mondays and Fridays.  Recheck in 7 weeks.

## 2018-05-01 ENCOUNTER — Other Ambulatory Visit: Payer: Self-pay

## 2018-05-21 ENCOUNTER — Other Ambulatory Visit: Payer: Self-pay | Admitting: Family Medicine

## 2018-05-22 NOTE — Telephone Encounter (Signed)
Name of Medication: Alprazolam Name of Pharmacy: [facility] or Written Date and Quantity: 04/24/18, #30 Last Office Visit and Type: 06/29/17, f/u Next Office Visit and Type: 07/03/18, CPE Pt 2 Last Controlled Substance Agreement Date: 01/24/17 Last UDS: 01/24/17

## 2018-05-23 NOTE — Telephone Encounter (Signed)
Eprescribed.

## 2018-06-13 ENCOUNTER — Telehealth: Payer: Self-pay

## 2018-06-13 NOTE — Telephone Encounter (Signed)
Spoke w/ pt's wife and explained that I need to r/s pt's appt on Mon, June 1, as I will be back in the office w/ fewer appt times due to COVID precautions. Pt is out getting them something to eat, so she suggested I call his cell phone.  He did not answer, so left vm asking him to call back to r/s to either Mon pm or another date if he would like am.

## 2018-06-15 NOTE — Telephone Encounter (Signed)

## 2018-06-15 NOTE — Telephone Encounter (Signed)
Left message on pt's mobile & home #s asking him to call and r/s appt on Monday.

## 2018-06-18 ENCOUNTER — Ambulatory Visit (INDEPENDENT_AMBULATORY_CARE_PROVIDER_SITE_OTHER): Payer: Medicare Other

## 2018-06-18 ENCOUNTER — Other Ambulatory Visit: Payer: Self-pay

## 2018-06-18 DIAGNOSIS — Z86718 Personal history of other venous thrombosis and embolism: Secondary | ICD-10-CM | POA: Diagnosis not present

## 2018-06-18 DIAGNOSIS — Z5181 Encounter for therapeutic drug level monitoring: Secondary | ICD-10-CM | POA: Diagnosis not present

## 2018-06-18 LAB — POCT INR: INR: 3.2 — AB (ref 2.0–3.0)

## 2018-06-18 NOTE — Patient Instructions (Signed)
Please take 1/2 tablet tonight, then continue taking 1 tablet daily except 1.5 on Mondays and Fridays.  Recheck in 6 weeks.

## 2018-06-20 ENCOUNTER — Other Ambulatory Visit: Payer: Self-pay | Admitting: Cardiovascular Disease

## 2018-06-20 NOTE — Telephone Encounter (Signed)
Please review for refill. Thanks!  

## 2018-06-28 ENCOUNTER — Ambulatory Visit: Payer: Medicare Other

## 2018-07-03 ENCOUNTER — Encounter: Payer: Medicare Other | Admitting: Family Medicine

## 2018-07-03 ENCOUNTER — Ambulatory Visit (INDEPENDENT_AMBULATORY_CARE_PROVIDER_SITE_OTHER): Payer: Medicare Other

## 2018-07-03 DIAGNOSIS — Z0000 Encounter for general adult medical examination without abnormal findings: Secondary | ICD-10-CM | POA: Diagnosis not present

## 2018-07-03 NOTE — Progress Notes (Signed)
Subjective:   Anthony Kim. is a 73 y.o. male who presents for Medicare Annual/Subsequent preventive examination.  Review of Systems:  N/A       Objective:    Vitals: There were no vitals taken for this visit.  There is no height or weight on file to calculate BMI.  Advanced Directives 07/03/2018 06/22/2017 08/03/2016 09/29/2015 08/05/2015 07/08/2015  Does Patient Have a Medical Advance Directive? Yes Yes No Yes Yes Yes  Type of Paramedic of [place];Living will [place];Living will - Healthcare Power of [place];Living will [place];Living will  Does patient want to make changes to medical advance directive? No - Patient declined - - No - Patient declined - No - Patient declined  Copy of [place] in Chart? Yes - validated most recent copy scanned in chart (See row information) No - copy requested - No - copy requested Yes (No Data)    Tobacco Social History   Tobacco Use  Smoking Status Former Smoker  . Packs/day: 1.00  . Years: 30.00  . Pack years: 30.00  . Types: Cigarettes  . Quit date: 01/18/1992  . Years since quitting: 26.4  Smokeless Tobacco Never Used     Counseling given: No   Clinical Intake:  Pre-visit preparation completed: Yes  Pain : No/denies pain Pain Score: 0-No pain     Nutritional Status: BMI > 30  Obese Nutritional Risks: None  How often do you need to have someone help you when you read instructions, pamphlets, or other written materials from your doctor or pharmacy?: 1 - Never What is the last grade level you completed in school?: 12th grade  Interpreter Needed?: No  Comments: pt lives with spouse Information entered by :: LPinson, RN  Past Medical History:  Diagnosis Date  . Anxiety   . CAD (coronary artery disease)    a. 07/2012 CTA chest - incidental coronary Ca2+ of LAD/LCX; b. 08/2012 Myoview: EF 59%, non  ischemia/scar-->Low risk.  . Clotting disorder ([place])   . Colon polyps    2008 Tubular Adenoma  . Cough    chronic  . Deep vein thrombosis (DVT) ([place]) 04/06/2010  . Depression   . DVT (deep venous thrombosis) ([place])    a. RLE DVT ~ 05/2010-->chronic coumadin.  Marland Kitchen GERD (gastroesophageal reflux disease)   . Hyperlipidemia   . Hypertension    controlled on meds  . IBS (irritable bowel syndrome)   . NICM (nonischemic cardiomyopathy) ([place])    a. 06/2016 Echo: EF 45-50%, diff HK, mod dil LA/RA.  Marland Kitchen Palpitations   . Persistent atrial fibrillation    a. Dx 06/2010 - CHA2DS2VASc = 3-4 (Chronic coumadin); b. 07/2016 s/p DCCV (150J) - initially successful but reverted to Afib.  . Pneumonia    3 wks ago/ no hospitalization/ no follow up required/ was on antibiotic and steroids/Dr Edilia Bo  . Presence of partial dental prosthetic device    lower right  . Seasonal allergies    Past Surgical History:  Procedure Laterality Date  . APPENDECTOMY     1967  . CARDIOVERSION N/A 08/03/2016   Procedure: CARDIOVERSION;  Surgeon: Minna Merritts, MD;  Location: [facility] ORS;  Service: Cardiovascular;  Laterality: N/A;  . CATARACT EXTRACTION W/PHACO Right 07/08/2015   Procedure: CATARACT EXTRACTION PHACO AND INTRAOCULAR LENS PLACEMENT ([place]) RIGHT EYE;  Surgeon: Leandrew Koyanagi, MD;  Location: [facility];  Service: Ophthalmology;  Laterality: Right;  SYMFONY LENS  . CATARACT EXTRACTION W/PHACO Left 08/05/2015   Procedure: CATARACT EXTRACTION PHACO AND INTRAOCULAR LENS PLACEMENT ([place]) left eye;  Surgeon: Leandrew Koyanagi, MD;  Location: [facility];  Service: Ophthalmology;  Laterality: Left;  SYMFONY LENS  . CATARACT EXTRACTION, BILATERAL Bilateral 08/2015  . COLONOSCOPY     x3  . POLYPECTOMY    . TONSILLECTOMY AND ADENOIDECTOMY     Family History  Problem Relation Age of Onset  . Heart disease Mother   . Hypertension Mother   . Irritable bowel syndrome Mother   . Heart attack Father    . Hypertension Father   . Prostate cancer Father    Social History   Socioeconomic History  . Marital status: Married    Spouse name: Not on file  . Number of children: 1  . Years of education: Not on file  . Highest education level: Not on file  Occupational History  . Occupation: Scientist, clinical (histocompatibility and immunogenetics): [employer]  . Financial resource strain: Not on file  . Food insecurity    Worry: Not on file    Inability: Not on file  . Transportation needs    Medical: Not on file    Non-medical: Not on file  Tobacco Use  . Smoking status: Former Smoker    Packs/day: 1.00    Years: 30.00    Pack years: 30.00    Types: Cigarettes    Quit date: 01/18/1992    Years since quitting: 26.4  . Smokeless tobacco: Never Used  Substance and Sexual Activity  . Alcohol use: Yes    Alcohol/week: 7.0 standard drinks    Types: 7 Glasses of wine per week  . Drug use: No  . Sexual activity: Yes  Lifestyle  . Physical activity    Days per week: Not on file    Minutes per session: Not on file  . Stress: Not on file  Relationships  . Social Herbalist on phone: Not on file    Gets together: Not on file    Attends religious service: Not on file    Active member of club or organization: Not on file    Attends meetings of clubs or organizations: Not on file    Relationship status: Not on file  Other Topics Concern  . Not on file  Social History Narrative   Has living will.   Desires CPR.   Would not want life support if recovery futile.  Unsure about feeding tube.    Outpatient Encounter Medications as of 07/03/2018  Medication Sig  . acetaminophen (TYLENOL) 325 MG tablet Take 325 mg by mouth every 6 (six) hours as needed for pain.   Marland Kitchen ALLERGY RELIEF 180 MG tablet TAKE ONE TABLET DAILY FOR CONGESTION.  Marland Kitchen ALPRAZolam (XANAX XR) 0.5 MG 24 hr tablet TAKE 1 TABLET DAILY.  Marland Kitchen AMBULATORY NON FORMULARY MEDICATION Medication Name:     nitroglycerin 0.125% . You should apply  a pea size amount to your rectum every 8 hours  [facility] information is below: Address: [address] [place], [place] [address]  [phone]  *Please DO NOT go directly from our office to pick up this medication! Give the pharmacy 1 day to process the prescription as this is compounded at takes time to make.  Marland Kitchen atorvastatin (LIPITOR) 40 MG tablet Take 1 tablet (40 mg total) by mouth daily at 6 PM.  . buPROPion (WELLBUTRIN XL) 150 MG 24  hr tablet Take 1 tablet (150 mg total) by mouth daily.  . cloNIDine (CATAPRES) 0.1 MG tablet Take 1 tablet (0.1 mg total) by mouth 2 (two) times daily.  . colesevelam (WELCHOL) 625 MG tablet Take 625 mg by mouth daily.  Marland Kitchen ezetimibe (ZETIA) 10 MG tablet Take 1 tablet (10 mg total) by mouth daily.  . fluticasone (FLONASE) 50 MCG/ACT nasal spray Place 2 sprays into both nostrils as needed. Reported on 07/08/2015  . meclizine (ANTIVERT) 25 MG tablet Take 25 mg by mouth 3 (three) times daily as needed for dizziness. Reported on 07/01/2015  . metoprolol tartrate (LOPRESSOR) 25 MG tablet Take 1 tablet (25 mg total) by mouth 2 (two) times daily.  . Multiple Vitamin (MULTIVITAMIN PO) Take 1 tablet by mouth daily. pm  . omeprazole (PRILOSEC) 40 MG capsule Take 1 capsule (40 mg total) by mouth daily.  . polyethylene glycol (MIRALAX / GLYCOLAX) packet Take 17 g by mouth daily.  Marland Kitchen warfarin (COUMADIN) 5 MG tablet Take up to 1.5 tablets per day by mouth as directed by the coumadin clinic.   No facility-administered encounter medications on file as of 07/03/2018.     Activities of Daily Living In your present state of health, do you have any difficulty performing the following activities: 07/03/2018  Hearing? N  Vision? N  Difficulty concentrating or making decisions? N  Walking or climbing stairs? N  Dressing or bathing? N  Doing errands, shopping? N  Preparing Food and eating ? N  Using the Toilet? N  In the past six months, have you accidently  leaked urine? N  Do you have problems with loss of bowel control? N  Managing your Medications? N  Managing your Finances? N  Housekeeping or managing your Housekeeping? N  Some recent data might be hidden    Patient Care Team: Ria Bush, MD as PCP - General (Family Medicine) Rockey Situ, Kathlene November, MD as Consulting Physician (Cardiology) Inda Castle, MD (Inactive) as Consulting Physician (Gastroenterology) McDiarmid, Blane Ohara, MD as Consulting Physician (Family Medicine) Bjorn Loser, MD as Consulting Physician (Urology) Philis Kendall, MD as Consulting Physician (Ophthalmology) Gaynelle Arabian, MD as Consulting Physician (Orthopedic Surgery) Leandrew Koyanagi, MD as Referring Physician (Ophthalmology)   Assessment:   This is a routine wellness examination for [place].   Hearing Screening   125Hz  250Hz  500Hz  1000Hz  2000Hz  3000Hz  4000Hz  6000Hz  8000Hz   Right ear:           Left ear:           Vision Screening Comments: Last vision exam in Fall 2019   Exercise Activities and Dietary recommendations Exercise limited by: None identified  Goals    . Patient Stated     Starting 07/04/2018, I will continue to take medications as prescribed.        Fall Risk Fall Risk  07/03/2018 06/22/2017 06/23/2014 03/22/2012  Falls in the past year? 0 No No No    Depression Screen PHQ 2/9 Scores 07/03/2018 06/22/2017 09/29/2015 06/23/2014  PHQ - 2 Score 0 0 0 0  PHQ- 9 Score 0 0 - -    Cognitive Function MMSE - Mini Mental State Exam 07/03/2018 06/22/2017 09/29/2015  Orientation to time 5 5 5   Orientation to Place 5 5 5   Registration 3 3 3   Attention/ Calculation 0 0 0  Recall 3 3 3   Language- name 2 objects 0 0 0  Language- repeat 1 1 1   Language- follow 3 step command 0 3 3  Language-  read & follow direction 0 0 0  Write a sentence 0 0 0  Copy design 0 0 0  Total score 17 20 20      PLEASE NOTE: A Mini-Cog screen was completed. Maximum score is 17. A value of 0 denotes this  part of [place] MMSE was not completed or the patient failed this part of the Mini-Cog screening.   Mini-Cog Screening Orientation to Time - Max 5 pts Orientation to Place - Max 5 pts Registration - Max 3 pts Recall - Max 3 pts Language Repeat - Max 1 pts      Immunization History  Administered Date(s) Administered  . Influenza Split 10/31/2011  . Influenza Whole 10/30/2007, 10/28/2008, 10/27/2009  . Influenza,inj,Quad PF,6+ Mos 11/13/2013, 10/14/2014, 10/01/2015, 01/12/2017, 11/09/2017  . Pneumococcal Conjugate-13 11/13/2013  . Pneumococcal Polysaccharide-23 03/23/2011  . Td 08/03/2000, 09/16/2008  . Zoster 10/28/2008    Screening Tests Health Maintenance  Topic Date Due  . DTaP/Tdap/Td (1 - Tdap) 09/17/2018 (Originally 07/02/1964)  . INFLUENZA VACCINE  08/18/2018  . TETANUS/TDAP  09/17/2018  . COLONOSCOPY  04/13/2021  . Hepatitis C Screening  Completed  . PNA vac Low Risk Adult  Completed       Plan:    I have personally reviewed, addressed, and noted the following in the patient's chart:  A. Medical and social history B. Use of alcohol, tobacco or illicit drugs  C. Current medications and supplements D. Functional ability and status E.  Nutritional status F.  Physical activity G. Advance directives H. List of other physicians I.  Hospitalizations, surgeries, and ER visits in previous 12 months J.  Vitals (unless it is a telemedicine encounter) K. Screenings to include cognitive, depression, hearing, vision (NOTE: hearing and vision screenings not completed in telemedicine encounter) L. Referrals and appointments   In addition, I have reviewed and discussed with patient certain preventive protocols, quality metrics, and best practice recommendations. A written personalized care plan for preventive services and recommendations were provided to patient.  With patient's permission, we connected on 07/04/18 at 12:00 PM EDT. Interactive audio and video  telecommunications were attempted with patient. This attempt was unsuccessful due to patient having technical difficulties OR patient did not have access to video capability.  Encounter was completed with audio only.  Two patient identifiers were used to ensure the encounter occurred with the correct person. Patient was in home and writer was in office.   Signed,   Lindell Noe, MHA, BS, RN Health Coach

## 2018-07-04 NOTE — Progress Notes (Signed)
PCP notes:   Health maintenance:  No gaps identified  Abnormal screenings:   None  Patient concerns:   None  Nurse concerns:  None  Next PCP appt:   10/18/18 @ 1200

## 2018-07-04 NOTE — Patient Instructions (Signed)
Anthony Kim , Thank you for taking time to come for your Medicare Wellness Visit. I appreciate your ongoing commitment to your health goals. Please review the following plan we discussed and let me know if I can assist you in the future.   These are the goals we discussed: Goals    . Patient Stated     Starting 07/04/2018, I will continue to take medications as prescribed.        This is a list of the screening recommended for you and due dates:  Health Maintenance  Topic Date Due  . DTaP/Tdap/Td vaccine (1 - Tdap) 09/17/2018*  . Flu Shot  08/18/2018  . Tetanus Vaccine  09/17/2018  . Colon Cancer Screening  04/13/2021  .  Hepatitis C: One time screening is recommended by Center for Disease Control  (CDC) for  adults born from [birth year] through 1966.   Completed  . Pneumonia vaccines  Completed  *Topic was postponed. The date shown is not the original due date.   Preventive Care for Adults  A healthy lifestyle and preventive care can promote health and wellness. Preventive health guidelines for adults include the following key practices.  . A routine yearly physical is a good way to check with your health care provider about your health and preventive screening. It is a chance to share any concerns and updates on your health and to receive a thorough exam.  . Visit your dentist for a routine exam and preventive care every 6 months. Brush your teeth twice a day and floss once a day. Good oral hygiene prevents tooth decay and gum disease.  . The frequency of eye exams is based on your age, health, family medical history, use  of contact lenses, and other factors. Follow your health care provider's recommendations for frequency of eye exams.  . Eat a healthy diet. Foods like vegetables, fruits, whole grains, low-fat dairy products, and lean protein foods contain the nutrients you need without too many calories. Decrease your intake of foods high in solid fats, added sugars, and salt. Eat the  right amount of calories for you. Get information about a proper diet from your health care provider, if necessary.  . Regular physical exercise is one of the most important things you can do for your health. Most adults should get at least 150 minutes of moderate-intensity exercise (any activity that increases your heart rate and causes you to sweat) each week. In addition, most adults need muscle-strengthening exercises on 2 or more days a week.  Silver Sneakers may be a benefit available to you. To determine eligibility, you may visit the website: www.silversneakers.com or contact program at 415-116-3466 Mon-Fri between 8AM-8PM.   . Maintain a healthy weight. The body mass index (BMI) is a screening tool to identify possible weight problems. It provides an estimate of body fat based on height and weight. Your health care provider can find your BMI and can help you achieve or maintain a healthy weight.   For adults 20 years and older: ? A BMI below 18.5 is considered underweight. ? A BMI of 18.5 to 24.9 is normal. ? A BMI of 25 to 29.9 is considered overweight. ? A BMI of 30 and above is considered obese.   . Maintain normal blood lipids and cholesterol levels by exercising and minimizing your intake of saturated fat. Eat a balanced diet with plenty of fruit and vegetables. Blood tests for lipids and cholesterol should begin at age 40 and be repeated every  5 years. If your lipid or cholesterol levels are high, you are over 50, or you are at high risk for heart disease, you may need your cholesterol levels checked more frequently. Ongoing high lipid and cholesterol levels should be treated with medicines if diet and exercise are not working.  . If you smoke, find out from your health care provider how to quit. If you do not use tobacco, please do not start.  . If you choose to drink alcohol, please do not consume more than 2 drinks per day. One drink is considered to be 12 ounces (355 mL) of  beer, 5 ounces (148 mL) of wine, or 1.5 ounces (44 mL) of liquor.  . If you are 37-41 years old, ask your health care provider if you should take aspirin to prevent strokes.  . Use sunscreen. Apply sunscreen liberally and repeatedly throughout the day. You should seek shade when your shadow is shorter than you. Protect yourself by wearing long sleeves, pants, a wide-brimmed hat, and sunglasses year round, whenever you are outdoors.  . Once a month, do a whole body skin exam, using a mirror to look at the skin on your back. Tell your health care provider of new moles, moles that have irregular borders, moles that are larger than a pencil eraser, or moles that have changed in shape or color.

## 2018-07-16 NOTE — Progress Notes (Signed)
I reviewed health advisor's note, was available for consultation, and agree with documentation and plan.  

## 2018-07-19 ENCOUNTER — Ambulatory Visit (INDEPENDENT_AMBULATORY_CARE_PROVIDER_SITE_OTHER): Payer: Medicare Other | Admitting: Family Medicine

## 2018-07-19 ENCOUNTER — Encounter: Payer: Self-pay | Admitting: Family Medicine

## 2018-07-19 VITALS — BP 130/87 | HR 78 | Ht 70.0 in | Wt 228.0 lb

## 2018-07-19 DIAGNOSIS — Z7901 Long term (current) use of anticoagulants: Secondary | ICD-10-CM

## 2018-07-19 DIAGNOSIS — I10 Essential (primary) hypertension: Secondary | ICD-10-CM | POA: Diagnosis not present

## 2018-07-19 DIAGNOSIS — E7800 Pure hypercholesterolemia, unspecified: Secondary | ICD-10-CM | POA: Diagnosis not present

## 2018-07-19 DIAGNOSIS — F331 Major depressive disorder, recurrent, moderate: Secondary | ICD-10-CM

## 2018-07-19 MED ORDER — ALPRAZOLAM ER 0.5 MG PO TB24: 0.5000 mg | ORAL_TABLET | Freq: Every day | ORAL | 5 refills | Status: DC

## 2018-07-19 MED ORDER — BUPROPION HCL ER (XL) 150 MG PO TB24: 150.0000 mg | ORAL_TABLET | Freq: Every day | ORAL | 3 refills | Status: DC

## 2018-07-19 NOTE — Progress Notes (Signed)
Virtual visit completed through [employer]. Due to national recommendations of social distancing due to COVID-19, a virtual visit is felt to be most appropriate for this patient at this time. Reviewed limitations of a virtual visit.   Patient location: home Provider location: [place] [facility], office If any vitals were documented, they were collected by patient at home unless specified below.    BP 130/87   Pulse 78   Ht 5\' 10"  (6.222 m)   Wt 228 lb (103.4 kg)   BMI 32.71 kg/m    CC: med f/u visit Subjective:    Patient ID: Anthony Conch., male    DOB: 1945/02/21, 73 y.o.   MRN: [id number]  HPI: Anthony Walkowski. is a 73 y.o. male presenting on 07/19/2018 for Medication Management   Saw Katha Cabal last month for medicare wellness visit. Note reviewed. No concerns identified - planned labs when he comes in office Oct 1st.   Preventative: Colonoscopy 2013 WNL, rpt 5 yrs --> changed to 10 yrs [name] will be due 2023 [name] Prostate cancer screening - sees urology yearly with PSA/DRE ([name] Father with h/o prostate cancer  Lung cancer screening - not eligible Flu shot -yearly Td 2010 Pneumovax 2013, prevnar 2015 Zostavax - 2010 Shingrix - discussed Advanced directive discussion - wife is HCPOA. Has advanced directives at home - asked to bring [place] copy. Desires CPR. Would not want life support if recovery futile. Unsure about feeding tube. Seat belt use discussed Sunscreen use discussed. No changing moles on skin. Saw dermatologist Ex -smoker - 30+ PY hx, quit cold [place] 1990s Alcohol - 1-2 glasses wine per night Dentist - Q6 mo Eye exam - yearly. Had cataract surgery.  Bowels - mild constipation managed with grape juice and rare miralax  Bladder - no incontinence  Lives and cares for with wife [name] Activity: active on cattle farm Diet: good water, G2 gatorade, fruits/vegetables daily      Relevant past medical, surgical, family and social  history reviewed and updated as indicated. Interim medical history since our last visit reviewed. Allergies and medications reviewed and updated. Outpatient Medications Prior to Visit  Medication Sig Dispense Refill  . acetaminophen (TYLENOL) 325 MG tablet Take 325 mg by mouth every 6 (six) hours as needed for pain.     Marland Kitchen ALLERGY RELIEF 180 MG tablet TAKE ONE TABLET DAILY FOR CONGESTION. 30 tablet 0  . AMBULATORY NON FORMULARY MEDICATION Medication Name:     nitroglycerin 0.125% . You should apply a pea size amount to your rectum every 8 hours  [facility] information is below: Address: [address], [place], [place] [address]  [phone]  *Please DO NOT go directly from our office to pick up this medication! Give the pharmacy 1 day to process the prescription as this is compounded at takes time to make. 30 g 1  . atorvastatin (LIPITOR) 40 MG tablet Take 1 tablet (40 mg total) by mouth daily at 6 PM. 90 tablet 3  . cloNIDine (CATAPRES) 0.1 MG tablet Take 1 tablet (0.1 mg total) by mouth 2 (two) times daily. 180 tablet 3  . colesevelam (WELCHOL) 625 MG tablet Take 625 mg by mouth daily.    Marland Kitchen ezetimibe (ZETIA) 10 MG tablet Take 1 tablet (10 mg total) by mouth daily. 90 tablet 3  . fluticasone (FLONASE) 50 MCG/ACT nasal spray Place 2 sprays into both nostrils as needed. Reported on 07/08/2015    . meclizine (ANTIVERT) 25 MG  tablet Take 25 mg by mouth 3 (three) times daily as needed for dizziness. Reported on 07/01/2015    . metoprolol tartrate (LOPRESSOR) 25 MG tablet Take 1 tablet (25 mg total) by mouth 2 (two) times daily. 180 tablet 3  . Multiple Vitamin (MULTIVITAMIN PO) Take 1 tablet by mouth daily. pm    . omeprazole (PRILOSEC) 40 MG capsule Take 1 capsule (40 mg total) by mouth daily. 30 capsule 5  . polyethylene glycol (MIRALAX / GLYCOLAX) packet Take 17 g by mouth daily.    Marland Kitchen warfarin (COUMADIN) 5 MG tablet Take up to 1.5 tablets per day by mouth as directed by the  coumadin clinic. 45 tablet 0  . ALPRAZolam (XANAX XR) 0.5 MG 24 hr tablet TAKE 1 TABLET DAILY. 30 tablet 3  . buPROPion (WELLBUTRIN XL) 150 MG 24 hr tablet Take 1 tablet (150 mg total) by mouth daily. 90 tablet 3   No facility-administered medications prior to visit.      Per HPI unless specifically indicated in ROS section below Review of Systems Objective:    BP 130/87   Pulse 78   Ht 5\' 10"  (1.778 m)   Wt 228 lb (103.4 kg)   BMI 32.71 kg/m   Wt Readings from Last 3 Encounters:  07/19/18 228 lb (103.4 kg)  02/22/18 242 lb (109.8 kg)  06/29/17 237 lb 4 oz (107.6 kg)     Physical exam: Gen: alert, NAD, not ill appearing Pulm: speaks in complete sentences without increased work of breathing Psych: normal mood, normal thought content      Results for orders placed or performed in visit on 04/30/18  POCT INR  Result Value Ref Range   INR 3.6 (A) 2.0 - 3.0  POCT INR  Result Value Ref Range   INR 3.2 (A) 2.0 - 3.0   Assessment & Plan:   Problem List Items Addressed This Visit    MDD (major depressive disorder), recurrent episode, moderate ([place]) - Primary    Stable period on wellbutrin and xanax XR. Desires to continue at current doses.       Relevant Medications   ALPRAZolam (XANAX XR) 0.5 MG 24 hr tablet   buPROPion (WELLBUTRIN XL) 150 MG 24 hr tablet   Long term current use of anticoagulant    Continue coumadin followed Q6wks by cardiology clinic.       HYPERCHOLESTEROLEMIA    Chronic on zetia and welchol. Continue. Update FLP when returns for labs 10/2018      Essential hypertension    Chronic, stable. Continue current regimen.           Meds ordered this encounter  Medications  . ALPRAZolam (XANAX XR) 0.5 MG 24 hr tablet    Sig: Take 1 tablet (0.5 mg total) by mouth daily.    Dispense:  30 tablet    Refill:  5  . buPROPion (WELLBUTRIN XL) 150 MG 24 hr tablet    Sig: Take 1 tablet (150 mg total) by mouth daily.    Dispense:  90 tablet    Refill:  3    No orders of the defined types were placed in this encounter.   I discussed the assessment and treatment plan with the patient. The patient was provided an opportunity to ask questions and all were answered. The patient agreed with the plan and demonstrated an understanding of the instructions. The patient was advised to call back or seek an in-person evaluation if the symptoms worsen or if the condition  fails to improve as anticipated.  Follow up plan: No follow-ups on file.  Ria Bush, MD

## 2018-07-19 NOTE — Assessment & Plan Note (Signed)
Stable period on wellbutrin and xanax XR. Desires to continue at current doses.

## 2018-07-19 NOTE — Assessment & Plan Note (Signed)
Chronic on zetia and welchol. Continue. Update FLP when returns for labs 10/2018

## 2018-07-19 NOTE — Assessment & Plan Note (Addendum)
Continue coumadin followed Q6wks by cardiology clinic.

## 2018-07-19 NOTE — Assessment & Plan Note (Signed)
Chronic, stable. Continue current regimen. 

## 2018-07-30 ENCOUNTER — Other Ambulatory Visit: Payer: Self-pay

## 2018-07-30 ENCOUNTER — Ambulatory Visit (INDEPENDENT_AMBULATORY_CARE_PROVIDER_SITE_OTHER): Payer: Medicare Other

## 2018-07-30 DIAGNOSIS — Z86718 Personal history of other venous thrombosis and embolism: Secondary | ICD-10-CM

## 2018-07-30 DIAGNOSIS — Z5181 Encounter for therapeutic drug level monitoring: Secondary | ICD-10-CM

## 2018-07-30 LAB — POCT INR: INR: 2.3 (ref 2.0–3.0)

## 2018-07-30 NOTE — Patient Instructions (Signed)
Please continue taking 1 tablet daily except 1.5 on Mondays and Fridays.  Recheck in 6 weeks.

## 2018-08-16 ENCOUNTER — Other Ambulatory Visit: Payer: Self-pay | Admitting: Cardiovascular Disease

## 2018-08-16 NOTE — Telephone Encounter (Signed)
Refill Request.  

## 2018-09-10 ENCOUNTER — Other Ambulatory Visit: Payer: Self-pay

## 2018-09-10 ENCOUNTER — Ambulatory Visit (INDEPENDENT_AMBULATORY_CARE_PROVIDER_SITE_OTHER): Payer: Medicare Other

## 2018-09-10 DIAGNOSIS — Z86718 Personal history of other venous thrombosis and embolism: Secondary | ICD-10-CM | POA: Diagnosis not present

## 2018-09-10 DIAGNOSIS — Z5181 Encounter for therapeutic drug level monitoring: Secondary | ICD-10-CM | POA: Diagnosis not present

## 2018-09-10 LAB — POCT INR: INR: 2.4 (ref 2.0–3.0)

## 2018-09-10 NOTE — Patient Instructions (Signed)
Please continue taking 1 tablet daily except 1.5 on Mondays and Fridays.  Recheck in 6 weeks.  

## 2018-09-12 ENCOUNTER — Other Ambulatory Visit: Payer: Self-pay | Admitting: Cardiovascular Disease

## 2018-09-16 ENCOUNTER — Emergency Department
Admission: EM | Admit: 2018-09-16 | Discharge: 2018-09-16 | Disposition: A | Discharge status: Home / Self Care | Payer: Medicare Other | Attending: Student | Admitting: Student

## 2018-09-16 ENCOUNTER — Other Ambulatory Visit: Payer: Self-pay

## 2018-09-16 ENCOUNTER — Emergency Department: Payer: Medicare Other

## 2018-09-16 ENCOUNTER — Encounter: Payer: Self-pay | Admitting: Emergency Medicine

## 2018-09-16 DIAGNOSIS — Z85828 Personal history of other malignant neoplasm of skin: Secondary | ICD-10-CM | POA: Insufficient documentation

## 2018-09-16 DIAGNOSIS — Z87891 Personal history of nicotine dependence: Secondary | ICD-10-CM | POA: Diagnosis not present

## 2018-09-16 DIAGNOSIS — Z20828 Contact with and (suspected) exposure to other viral communicable diseases: Secondary | ICD-10-CM | POA: Insufficient documentation

## 2018-09-16 DIAGNOSIS — R1013 Epigastric pain: Secondary | ICD-10-CM | POA: Insufficient documentation

## 2018-09-16 DIAGNOSIS — I2510 Atherosclerotic heart disease of native coronary artery without angina pectoris: Secondary | ICD-10-CM | POA: Insufficient documentation

## 2018-09-16 DIAGNOSIS — R0989 Other specified symptoms and signs involving the circulatory and respiratory systems: Secondary | ICD-10-CM | POA: Diagnosis not present

## 2018-09-16 DIAGNOSIS — Z79899 Other long term (current) drug therapy: Secondary | ICD-10-CM | POA: Insufficient documentation

## 2018-09-16 DIAGNOSIS — I10 Essential (primary) hypertension: Secondary | ICD-10-CM | POA: Insufficient documentation

## 2018-09-16 DIAGNOSIS — Z7901 Long term (current) use of anticoagulants: Secondary | ICD-10-CM | POA: Insufficient documentation

## 2018-09-16 DIAGNOSIS — I700 Atherosclerosis of aorta: Secondary | ICD-10-CM | POA: Diagnosis not present

## 2018-09-16 DIAGNOSIS — K8020 Calculus of gallbladder without cholecystitis without obstruction: Secondary | ICD-10-CM | POA: Diagnosis not present

## 2018-09-16 DIAGNOSIS — R109 Unspecified abdominal pain: Secondary | ICD-10-CM | POA: Diagnosis present

## 2018-09-16 DIAGNOSIS — R1010 Upper abdominal pain, unspecified: Secondary | ICD-10-CM

## 2018-09-16 LAB — CBC
HCT: 43.7 % (ref 39.0–52.0)
Hemoglobin: 15 g/dL (ref 13.0–17.0)
MCH: 31.9 pg (ref 26.0–34.0)
MCHC: 34.3 g/dL (ref 30.0–36.0)
MCV: 93 fL (ref 80.0–100.0)
Platelets: 152 10*3/uL (ref 150–400)
RBC: 4.7 MIL/uL (ref 4.22–5.81)
RDW: 12.7 % (ref 11.5–15.5)
WBC: 9.5 10*3/uL (ref 4.0–10.5)
nRBC: 0 % (ref 0.0–0.2)

## 2018-09-16 LAB — BASIC METABOLIC PANEL
Anion gap: 12 (ref 5–15)
BUN: 16 mg/dL (ref 8–23)
CO2: 23 mmol/L (ref 22–32)
Calcium: 9.2 mg/dL (ref 8.9–10.3)
Chloride: 104 mmol/L (ref 98–111)
Creatinine, Ser: 1.04 mg/dL (ref 0.61–1.24)
GFR calc Af Amer: >60 mL/min (ref >60)
GFR calc non Af Amer: >60 mL/min (ref >60)
Glucose, Bld: 117 mg/dL — ABNORMAL HIGH (ref 70–99)
Potassium: 4.1 mmol/L (ref 3.5–5.1)
Sodium: 139 mmol/L (ref 135–145)

## 2018-09-16 LAB — HEPATIC FUNCTION PANEL
ALT: 27 U/L (ref 0–44)
AST: 23 U/L (ref 15–41)
Albumin: 3.9 g/dL (ref 3.5–5.0)
Alkaline Phosphatase: 68 U/L (ref 38–126)
Bilirubin, Direct: 0.3 mg/dL — ABNORMAL HIGH (ref 0.0–0.2)
Indirect Bilirubin: 1.1 mg/dL — ABNORMAL HIGH (ref 0.3–0.9)
Total Bilirubin: 1.4 mg/dL — ABNORMAL HIGH (ref 0.3–1.2)
Total Protein: 7.1 g/dL (ref 6.5–8.1)

## 2018-09-16 LAB — SARS CORONAVIRUS 2 BY RT PCR (HOSPITAL ORDER, PERFORMED IN ~~LOC~~ HOSPITAL LAB): SARS Coronavirus 2: NEGATIVE

## 2018-09-16 LAB — TROPONIN I (HIGH SENSITIVITY): Troponin I (High Sensitivity): 4 ng/L (ref <18)

## 2018-09-16 LAB — LIPASE, BLOOD: Lipase: 49 U/L (ref 11–51)

## 2018-09-16 MED ORDER — SODIUM CHLORIDE 0.9% FLUSH: 3.0000 mL | Freq: Once | INTRAVENOUS | Status: DC

## 2018-09-16 NOTE — ED Provider Notes (Addendum)
4:01 PM Assumed care for off going team.   Blood pressure (!) 138/95, pulse 81, temperature 98.5 F (36.9 C), temperature source Oral, resp. rate 18, height 5\' 11"  (1.803 m), weight 113.4 kg, SpO2 98 %.  See their HPI for full report but in brief   Epigastric pain, not passing gas, not having BM, CT concerning for pancreatitis although symptoms do not seem to correlate with that. Pending lipase, if eating and drinking can d/c home.   Lipase is normal.  LFTs are normal.  Reevaluated patient.  Patient is doing very well.  No abdominal tenderness at this time.  Patient does report just feeling gassy at times.  I reexamined his abdomen there is no left upper quadrant tenderness.  I have very low suspicion for pancreatitis at this time.  I do lengthy discussion with the patient about his imaging and the concern for pseudocyst versus cancer.  Patient has a primary care doctor he will call tomorrow to get a MRI for follow-up.   Patient also has a 3 cm AAA.  Patient provided instructions to follow-up for a repeat ultrasound in 3 years per the imaging recommendation.  I discussed the provisional nature of ED diagnosis, the treatment so far, the ongoing plan of care, follow up appointments and return precautions with the patient and any family or support people present. They expressed understanding and agreed with the plan, discharged home.            Vanessa [place], MD 09/16/18 1603    Vanessa Logan, MD 09/16/18 [phone]

## 2018-09-16 NOTE — ED Notes (Signed)

## 2018-09-16 NOTE — Discharge Instructions (Signed)
Your CT scan had a few incidental findings.  You to follow-up with your primary care doctor for MRI to evaluate your pancreas to make sure that there is not any signs of cancer.  You also have something called a AAA that is 3 cm.  This is dilation of your aorta that brings the blood to the rest of your body.  You will need a repeat ultrasound of this in 3 years to make sure that is not enlarging.  Return to the ER for any other concerns, worsening abdominal pain, fevers, vomiting.   IMPRESSION: 1. Overall findings suspicious for acute pancreatitis. Correlation with lipase is recommended. There is a masslike structure in the region of the pancreatic head. Differential considerations include a complex pseudocyst or less likely an underlying pancreatic neoplasm. Follow-up with a nonemergent outpatient contrast enhanced MRI is recommended for further evaluation of this finding. 2. Infrarenal abdominal aortic aneurysm measuring approximately 3 cm. Recommend followup by ultrasound in 3 years. This recommendation follows ACR consensus guidelines: White Paper of the ACR Incidental Findings Committee II on Vascular Findings. J Am Coll Radiol 2013;

## 2018-09-16 NOTE — ED Triage Notes (Signed)
Pt to ED via POV c/o chest pain. Pt states that he has been having pain since Friday. Pt reports that the pain is in the center of his chest, denies radiation of the pain. Pt states that the only other symptom that he is having is that the is burping a lot. 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 Pt did take 1 tramadol and states that this helped with his pain some. Pt is in NAD at this time.

## 2018-09-16 NOTE — ED Provider Notes (Signed)
[facility] Emergency Department Provider Note  ____________________________________________   First MD Initiated Contact with Patient 09/16/18 1420     (approximate)  I have reviewed the triage vital signs and the nursing notes.  History  Chief Complaint Chest Pain    HPI Anthony Kim. is a 73 y.o. male with history of CAD, A. fib, DVT who presents the emergency department for abdominal discomfort.  Patient reports symptoms have been present for approximately 2 to 3 days.  He describes them as a gas type pain.  Locates it to his upper abdomen, epigastric area.  He denies any associated chest pain (as initially noted in triage) or shortness of breath.  He states he took a laxative and Maalox which did improve mildly his symptoms.  His discomfort is currently mild in severity.  He reports an increase in burping.  He did have a normal bowel movement yesterday, and is passing gas, but feels like this may be slightly less than his normal and therefore feels bloated.  He denies any fevers, nausea, vomiting, diarrhea, or sick contacts.         Past Medical Hx Past Medical History:  Diagnosis Date  . Anxiety   . CAD (coronary artery disease)    a. 07/2012 CTA chest - incidental coronary Ca2+ of LAD/LCX; b. 08/2012 Myoview: EF 59%, non ischemia/scar-->Low risk.  . Clotting disorder ([place])   . Colon polyps    2008 Tubular Adenoma  . Cough    chronic  . Deep vein thrombosis (DVT) ([place]) 04/06/2010  . Depression   . DVT (deep venous thrombosis) ([place])    a. RLE DVT ~ 05/2010-->chronic coumadin.  Marland Kitchen GERD (gastroesophageal reflux disease)   . Hyperlipidemia   . Hypertension    controlled on meds  . IBS (irritable bowel syndrome)   . NICM (nonischemic cardiomyopathy) ([place])    a. 06/2016 Echo: EF 45-50%, diff HK, mod dil LA/RA.  Marland Kitchen Palpitations   . Persistent atrial fibrillation    a. Dx 06/2010 - CHA2DS2VASc = 3-4 (Chronic coumadin); b. 07/2016 s/p DCCV (150J) -  initially successful but reverted to Afib.  . Pneumonia    3 wks ago/ no hospitalization/ no follow up required/ was on antibiotic and steroids/Dr Edilia Bo  . Presence of partial dental prosthetic device    lower right  . Seasonal allergies     Problem List Patient Active Problem List   Diagnosis Date Noted  . Advanced care planning/counseling discussion 06/29/2017  . Long-term use of high-risk medication 01/28/2017  . Persistent atrial fibrillation 12/10/2016  . Obesity, Class I, BMI 30.0-34.9 (see actual BMI) 09/09/2014  . Renal insufficiency 06/23/2014  . Encounter for therapeutic drug monitoring 03/20/2013  . Sleep apnea 09/05/2012  . Allergic rhinitis 03/22/2012  . Anxiety 02/04/2011  . Long term current use of anticoagulant 04/06/2010  . History of DVT of lower extremity 07/18/2008  . Edema 06/26/2008  . SKIN CANCER, HX OF 10/30/2007  . ERECTILE DYSFUNCTION 04/30/2007  . COLONIC POLYPS 2/08 07/12/2006  . ARRHYTHMIA, HX OF 6/98 07/12/2006  . HYPERCHOLESTEROLEMIA 04/20/2006  . MDD (major depressive disorder), recurrent episode, moderate ([place]) 04/20/2006  . Essential hypertension 04/20/2006    Past Surgical Hx Past Surgical History:  Procedure Laterality Date  . APPENDECTOMY     1967  . CARDIOVERSION N/A 08/03/2016   Procedure: CARDIOVERSION;  Surgeon: Minna Merritts, MD;  Location: [facility] ORS;  Service: Cardiovascular;  Laterality: N/A;  . CATARACT EXTRACTION W/PHACO Right 07/08/2015  Procedure: CATARACT EXTRACTION PHACO AND INTRAOCULAR LENS PLACEMENT ([place]) RIGHT EYE;  Surgeon: Leandrew Koyanagi, MD;  Location: [place];  Service: Ophthalmology;  Laterality: Right;   SYMFONY LENS  . CATARACT EXTRACTION W/PHACO Left 08/05/2015   Procedure: CATARACT EXTRACTION PHACO AND INTRAOCULAR LENS PLACEMENT ([place]) left eye;  Surgeon: Leandrew Koyanagi, MD;  Location: [place];  Service: Ophthalmology;  Laterality: Left;  SYMFONY LENS  . CATARACT EXTRACTION,  BILATERAL Bilateral 08/2015  . COLONOSCOPY     x3  . POLYPECTOMY    . TONSILLECTOMY AND ADENOIDECTOMY      Medications Prior to Admission medications   Medication Sig Start Date End Date Taking? Authorizing Provider  acetaminophen (TYLENOL) 325 MG tablet Take 325 mg by mouth every 6 (six) hours as needed for pain.     [provider]  ALLERGY RELIEF 180 MG tablet TAKE ONE TABLET DAILY FOR CONGESTION. 03/15/18   Ria Bush, MD  ALPRAZolam (XANAX XR) 0.5 MG 24 hr tablet Take 1 tablet (0.5 mg total) by mouth daily. 07/19/18   Ria Bush, MD  AMBULATORY NON FORMULARY MEDICATION Medication Name:     nitroglycerin 0.125% . You should apply a pea size amount to your rectum every 8 hours  [facility] information is below: Address: [address], [place], [place] [address]  [phone]  *Please DO NOT go directly from our office to pick up this medication! Give the pharmacy 1 day to process the prescription as this is compounded at takes time to make. 04/14/17   Armbruster, Carlota Raspberry, MD  atorvastatin (LIPITOR) 40 MG tablet Take 1 tablet (40 mg total) by mouth daily at 6 PM. 02/22/18   Gollan, Kathlene November, MD  buPROPion (WELLBUTRIN XL) 150 MG 24 hr tablet Take 1 tablet (150 mg total) by mouth daily. 07/19/18   Ria Bush, MD  cloNIDine (CATAPRES) 0.1 MG tablet Take 1 tablet (0.1 mg total) by mouth 2 (two) times daily. 02/22/18   Minna Merritts, MD  colesevelam (WELCHOL) 625 MG tablet Take 625 mg by mouth daily. 06/10/09   [provider]  ezetimibe (ZETIA) 10 MG tablet Take 1 tablet (10 mg total) by mouth daily. 02/22/18   Minna Merritts, MD  fluticasone (FLONASE) 50 MCG/ACT nasal spray Place 2 sprays into both nostrils as needed. Reported on 07/08/2015    [provider]  meclizine (ANTIVERT) 25 MG tablet Take 25 mg by mouth 3 (three) times daily as needed for dizziness. Reported on 07/01/2015    [provider]  metoprolol tartrate  (LOPRESSOR) 25 MG tablet Take 1 tablet (25 mg total) by mouth 2 (two) times daily. 02/22/18   Minna Merritts, MD  Multiple Vitamin (MULTIVITAMIN PO) Take 1 tablet by mouth daily. pm    [provider]  omeprazole (PRILOSEC) 40 MG capsule Take 1 capsule (40 mg total) by mouth daily. 09/12/18   Minna Merritts, MD  polyethylene glycol (MIRALAX / GLYCOLAX) packet Take 17 g by mouth daily.    [provider]  warfarin (COUMADIN) 5 MG tablet Take up to 1.5 tablets per day by mouth as directed by the coumadin clinic. 08/16/18   Minna Merritts, MD    Allergies Iodine  Family Hx Family History  Problem Relation Age of Onset  . Heart disease Mother   . Hypertension Mother   . Irritable bowel syndrome Mother   . Heart attack Father   . Hypertension Father   . Prostate cancer Father  Social Hx Social History   Tobacco Use  . Smoking status: Former Smoker    Packs/day: 1.00    Years: 30.00    Pack years: 30.00    Types: Cigarettes    Quit date: 01/18/1992    Years since quitting: 26.6  . Smokeless tobacco: Never Used  Substance Use Topics  . Alcohol use: Yes    Alcohol/week: 7.0 standard drinks    Types: 7 Glasses of wine per week  . Drug use: No     Review of Systems  Constitutional: Negative for fever. Negative for chills. Eyes: Negative for visual changes. ENT: Negative for sore throat. Cardiovascular: Negative for chest pain. Respiratory: Negative for shortness of breath. Gastrointestinal: + for abdominal pain. Negative for nausea. Negative for vomiting. Genitourinary: Negative for dysuria. Musculoskeletal: Negative for leg swelling. Skin: Negative for rash. Neurological: Negative for for headaches.   Physical Exam  Vital Signs: ED Triage Vitals  Enc Vitals Group     BP 09/16/18 1159 (!) 138/95     Pulse Rate 09/16/18 1159 87     Resp 09/16/18 1159 16     Temp 09/16/18 1159 98.5 F (36.9 C)     Temp Source 09/16/18 1159 Oral     SpO2  09/16/18 1159 95 %     Weight 09/16/18 1200 250 lb (113.4 kg)     Height 09/16/18 1200 5\' 11"  (1.803 m)     Head Circumference --      Peak Flow --      Pain Score 09/16/18 1200 10     Pain Loc --      Pain Edu? --      Excl. in [place]? --     Constitutional: Alert and oriented.  Eyes: Conjunctivae clear. Sclera anicteric. Head: Normocephalic. Atraumatic. Nose: No congestion. No rhinorrhea. Mouth/Throat: Mucous membranes are moist.  Neck: No stridor.   Cardiovascular: Irregularly irregular. No murmurs. Extremities well perfused. Respiratory: Normal respiratory effort.  Lungs CTAB. Gastrointestinal: Soft, obese, but not distended.  Mild upper abdominal discomfort with palpation, but no rebound or guarding.  Remainder of abdomen is soft and nontender. Musculoskeletal: No lower extremity edema. Neurologic:  Normal speech and language. No gross focal neurologic deficits are appreciated.  Skin: Skin is warm, dry and intact. No rash noted. Psychiatric: Mood and affect are appropriate for situation.  EKG  Personally reviewed.   Rate: Low 100s Rhythm: Atrial fibrillation Axis: Normal Intervals: Within normal limits No acute ischemic changes No STEMI    Radiology  CT abdomen pelvis, pending.   Procedures  Procedure(s) performed (including critical care):  Procedures   Initial Impression / Assessment and Plan / ED Course  73 y.o. male who presents to the ED for upper abdominal discomfort, gas type pains since Friday.  Ddx: constipation, obstruction, pancreatitis, gastritis, atypical ACS, amongst others  Plan: basic labs ordered in triage and reveal normal electrolytes; negative high-sensitivity troponin and non-ischemic EKG; unremarkable CBC. Added on hepatic function panel, lipase, as well as CT abdomen pelvis.  Patient care transferred at shift change to oncoming provider, awaiting CT scan and labs, reassessment.  Final Clinical Impression(s) / ED Diagnosis  Final  diagnoses:  Upper abdominal pain  Epigastric pain     Note:  This document was prepared using Dragon voice recognition software and may include unintentional dictation errors.   Lilia Pro., MD 09/16/18 [phone]

## 2018-09-17 ENCOUNTER — Telehealth: Payer: Self-pay | Admitting: Family Medicine

## 2018-09-17 ENCOUNTER — Encounter: Payer: Self-pay | Admitting: Family Medicine

## 2018-09-17 DIAGNOSIS — I700 Atherosclerosis of aorta: Secondary | ICD-10-CM | POA: Insufficient documentation

## 2018-09-17 DIAGNOSIS — I714 Abdominal aortic aneurysm, without rupture, unspecified: Secondary | ICD-10-CM | POA: Insufficient documentation

## 2018-09-17 DIAGNOSIS — K8689 Other specified diseases of pancreas: Secondary | ICD-10-CM

## 2018-09-17 NOTE — Telephone Encounter (Signed)
Pt seen at ER over weekend, ER records reviewed.  I have ordered MRI to further evaluate possible pancreas mass seen on CT.  Let us know sooner if worsening or persistent GI symptoms for in-office visit.

## 2018-09-17 NOTE — Telephone Encounter (Signed)
MRI scheduled and patient is aware.

## 2018-09-17 NOTE — Telephone Encounter (Signed)
Spoke with pt relaying Dr. G's message. Pt verbalizes understanding.  

## 2018-09-27 ENCOUNTER — Ambulatory Visit
Admission: RE | Admit: 2018-09-27 | Discharge: 2018-09-27 | Disposition: A | Discharge status: Home / Self Care | Payer: Medicare Other | Source: Ambulatory Visit | Attending: Family Medicine | Admitting: Family Medicine

## 2018-09-27 ENCOUNTER — Other Ambulatory Visit: Payer: Self-pay

## 2018-09-27 DIAGNOSIS — I714 Abdominal aortic aneurysm, without rupture: Secondary | ICD-10-CM | POA: Diagnosis not present

## 2018-09-27 DIAGNOSIS — K8689 Other specified diseases of pancreas: Secondary | ICD-10-CM | POA: Insufficient documentation

## 2018-09-27 MED ORDER — GADOBUTROL 1 MMOL/ML IV SOLN
10.0000 mL | Freq: Once | INTRAVENOUS | Status: AC | PRN
  Administered 2018-09-27: 19:00:00 10 mL via INTRAVENOUS

## 2018-10-18 ENCOUNTER — Encounter: Payer: Self-pay | Admitting: Family Medicine

## 2018-10-18 ENCOUNTER — Ambulatory Visit (INDEPENDENT_AMBULATORY_CARE_PROVIDER_SITE_OTHER): Payer: Medicare Other | Admitting: Family Medicine

## 2018-10-18 ENCOUNTER — Encounter: Payer: Medicare Other | Admitting: Family Medicine

## 2018-10-18 ENCOUNTER — Other Ambulatory Visit: Payer: Self-pay

## 2018-10-18 VITALS — BP 122/64 | HR 58 | Temp 98.4°F | Ht 70.5 in | Wt 220.6 lb

## 2018-10-18 DIAGNOSIS — Z23 Encounter for immunization: Secondary | ICD-10-CM | POA: Diagnosis not present

## 2018-10-18 DIAGNOSIS — I700 Atherosclerosis of aorta: Secondary | ICD-10-CM

## 2018-10-18 DIAGNOSIS — Z125 Encounter for screening for malignant neoplasm of prostate: Secondary | ICD-10-CM

## 2018-10-18 DIAGNOSIS — I4819 Other persistent atrial fibrillation: Secondary | ICD-10-CM

## 2018-10-18 DIAGNOSIS — I714 Abdominal aortic aneurysm, without rupture, unspecified: Secondary | ICD-10-CM

## 2018-10-18 DIAGNOSIS — H6121 Impacted cerumen, right ear: Secondary | ICD-10-CM | POA: Diagnosis not present

## 2018-10-18 DIAGNOSIS — E7800 Pure hypercholesterolemia, unspecified: Secondary | ICD-10-CM

## 2018-10-18 LAB — COMPREHENSIVE METABOLIC PANEL
ALT: 38 U/L (ref 0–53)
AST: 25 U/L (ref 0–37)
Albumin: 4.2 g/dL (ref 3.5–5.2)
Alkaline Phosphatase: 76 U/L (ref 39–117)
BUN: 25 mg/dL — ABNORMAL HIGH (ref 6–23)
CO2: 30 mEq/L (ref 19–32)
Calcium: 9.5 mg/dL (ref 8.4–10.5)
Chloride: 105 mEq/L (ref 96–112)
Creatinine, Ser: 1.03 mg/dL (ref 0.40–1.50)
GFR: 70.54 mL/min (ref >60.00)
Glucose, Bld: 97 mg/dL (ref 70–99)
Potassium: 4.9 mEq/L (ref 3.5–5.1)
Sodium: 142 mEq/L (ref 135–145)
Total Bilirubin: 1.3 mg/dL — ABNORMAL HIGH (ref 0.2–1.2)
Total Protein: 6.7 g/dL (ref 6.0–8.3)

## 2018-10-18 LAB — LIPID PANEL
Cholesterol: 100 mg/dL (ref 0–200)
HDL: 35.6 mg/dL — ABNORMAL LOW (ref >39.00)
LDL Cholesterol: 49 mg/dL (ref 0–99)
NonHDL: 64.46
Total CHOL/HDL Ratio: 3
Triglycerides: 79 mg/dL (ref 0.0–149.0)
VLDL: 15.8 mg/dL (ref 0.0–40.0)

## 2018-10-18 LAB — PSA, MEDICARE: PSA: 0.56 ng/ml (ref 0.10–4.00)

## 2018-10-18 NOTE — Progress Notes (Signed)
This visit was conducted in person.  BP 122/64 (BP Location: Right Arm, Patient Position: Sitting, Cuff Size: Large)   Pulse (!) 58   Temp 98.4 F (36.9 C) (Temporal)   Ht 5' 10.5" (1.791 m)   Wt 220 lb 9 oz (100 kg)   SpO2 98%   BMI 31.20 kg/m    CC: AMW  Subjective:    Patient ID: Anthony Conch., male    DOB: 1945-03-16, 73 y.o.   MRN: [id number]  HPI: Anthony Polishchuk. is a 73 y.o. male presenting on 10/18/2018 for Annual Exam (Prt 2. )   Saw health advisor 06/2018 for medicare wellness visit. Note reviewed.    Here for 3 mo f/u with labs. Doing well today without concerns.  Recent MR abdomen for abnormal pancreas on CT scan - reassuring results. This was after pancreatitis s/p ER eval 08/2018.   Noticing some wax buildup in right ear affecting hearing. No ear pain or tinnitus or drainage.   10 lb weight loss noted - through healthy diet changes. No fried foods, increased salads and yogurt.   Persistent afib on coumadin  Next cards f/u is early 2021     Relevant past medical, surgical, family and social history reviewed and updated as indicated. Interim medical history since our last visit reviewed. Allergies and medications reviewed and updated. Outpatient Medications Prior to Visit  Medication Sig Dispense Refill  . acetaminophen (TYLENOL) 325 MG tablet Take 325 mg by mouth every 6 (six) hours as needed for pain.     Marland Kitchen ALLERGY RELIEF 180 MG tablet TAKE ONE TABLET DAILY FOR CONGESTION. 30 tablet 0  . ALPRAZolam (XANAX XR) 0.5 MG 24 hr tablet Take 1 tablet (0.5 mg total) by mouth daily. 30 tablet 5  . AMBULATORY NON FORMULARY MEDICATION Medication Name:     nitroglycerin 0.125% . You should apply a pea size amount to your rectum every 8 hours  [facility] information is below: Address: [address] [place], [place] [address]  [phone]  *Please DO NOT go directly from our office to pick up this medication! Give the pharmacy 1 day to  process the prescription as this is compounded at takes time to make. 30 g 1  . atorvastatin (LIPITOR) 40 MG tablet Take 1 tablet (40 mg total) by mouth daily at 6 PM. 90 tablet 3  . buPROPion (WELLBUTRIN XL) 150 MG 24 hr tablet Take 1 tablet (150 mg total) by mouth daily. 90 tablet 3  . cloNIDine (CATAPRES) 0.1 MG tablet Take 1 tablet (0.1 mg total) by mouth 2 (two) times daily. 180 tablet 3  . ezetimibe (ZETIA) 10 MG tablet Take 1 tablet (10 mg total) by mouth daily. 90 tablet 3  . meclizine (ANTIVERT) 25 MG tablet Take 25 mg by mouth 3 (three) times daily as needed for dizziness. Reported on 07/01/2015    . metoprolol tartrate (LOPRESSOR) 25 MG tablet Take 1 tablet (25 mg total) by mouth 2 (two) times daily. 180 tablet 3  . Multiple Vitamin (MULTIVITAMIN PO) Take 1 tablet by mouth daily. pm    . omeprazole (PRILOSEC) 40 MG capsule Take 1 capsule (40 mg total) by mouth daily. 30 capsule 3  . polyethylene glycol (MIRALAX / GLYCOLAX) packet Take 17 g by mouth daily.    Marland Kitchen warfarin (COUMADIN) 5 MG tablet Take up to 1.5 tablets per day by mouth as directed by the coumadin clinic. 45 tablet 1  . colesevelam [facility])  625 MG tablet Take 625 mg by mouth daily.    . fluticasone (FLONASE) 50 MCG/ACT nasal spray Place 2 sprays into both nostrils as needed. Reported on 07/08/2015     No facility-administered medications prior to visit.      Per HPI unless specifically indicated in ROS section below Review of Systems Objective:    BP 122/64 (BP Location: Right Arm, Patient Position: Sitting, Cuff Size: Large)   Pulse (!) 58   Temp 98.4 F (36.9 C) (Temporal)   Ht 5' 10.5" (1.791 m)   Wt 220 lb 9 oz (100 kg)   SpO2 98%   BMI 31.20 kg/m   Wt Readings from Last 3 Encounters:  10/18/18 220 lb 9 oz (100 kg)  09/16/18 250 lb (113.4 kg)  07/19/18 228 lb (103.4 kg)    Physical Exam Vitals signs and nursing note reviewed.  Constitutional:      General: He is not in acute distress.    Appearance:  Normal appearance. He is well-developed. He is not ill-appearing.  HENT:     Head: Normocephalic and atraumatic.     Right Ear: Hearing, tympanic membrane and external ear normal. There is impacted cerumen.     Left Ear: Hearing, tympanic membrane and external ear normal. There is impacted cerumen.     Ears:     Comments: Bilateral cerumen impaction successfully removed with plastic curette    Nose: Nose normal.     Mouth/Throat:     Mouth: Mucous membranes are moist.     Pharynx: Oropharynx is clear. Uvula midline. No oropharyngeal exudate or posterior oropharyngeal erythema.  Eyes:     General: No scleral icterus.    Conjunctiva/sclera: Conjunctivae normal.     Pupils: Pupils are equal, round, and reactive to light.  Neck:     Musculoskeletal: Normal range of motion and neck supple.     Vascular: No carotid bruit.  Cardiovascular:     Rate and Rhythm: Normal rate. Rhythm irregular.     Pulses: Normal pulses.          Radial pulses are 2+ on the right side and 2+ on the left side.     Heart sounds: Normal heart sounds. No murmur.     Comments: Mildly irregular Pulmonary:     Effort: Pulmonary effort is normal. No respiratory distress.     Breath sounds: Normal breath sounds. No wheezing, rhonchi or rales.  Abdominal:     General: Abdomen is flat. Bowel sounds are normal. There is no distension.     Palpations: Abdomen is soft. There is no mass.     Tenderness: There is no abdominal tenderness. There is no guarding or rebound.     Hernia: No hernia is present.  Musculoskeletal: Normal range of motion.     Right lower leg: No edema.     Left lower leg: No edema.  Lymphadenopathy:     Cervical: No cervical adenopathy.  Skin:    General: Skin is warm and dry.     Findings: No rash.  Neurological:     General: No focal deficit present.     Mental Status: He is alert and oriented to person, place, and time.     Comments: CN grossly intact, station and gait intact  Psychiatric:         Mood and Affect: Mood normal.        Behavior: Behavior normal.        Thought Content: Thought content normal.  Judgment: Judgment normal.       Results for orders placed or performed in visit on 10/18/18  Comprehensive metabolic panel  Result Value Ref Range   Sodium 142 135 - 145 mEq/L   Potassium 4.9 3.5 - 5.1 mEq/L   Chloride 105 96 - 112 mEq/L   CO2 30 19 - 32 mEq/L   Glucose, Bld 97 70 - 99 mg/dL   BUN 25 (H) 6 - 23 mg/dL   Creatinine, Ser 1.03 0.40 - 1.50 mg/dL   Total Bilirubin 1.3 (H) 0.2 - 1.2 mg/dL   Alkaline Phosphatase 76 39 - 117 U/L   AST 25 0 - 37 U/L   ALT 38 0 - 53 U/L   Total Protein 6.7 6.0 - 8.3 g/dL   Albumin 4.2 3.5 - 5.2 g/dL   Calcium 9.5 8.4 - 10.5 mg/dL   GFR 70.54 >60.00 mL/min  Lipid panel  Result Value Ref Range   Cholesterol 100 0 - 200 mg/dL   Triglycerides 79.0 0.0 - 149.0 mg/dL   HDL 35.60 (L) >39.00 mg/dL   VLDL 15.8 0.0 - 40.0 mg/dL   LDL Cholesterol 49 0 - 99 mg/dL   Total CHOL/HDL Ratio 3    NonHDL 64.46   PSA, Medicare  Result Value Ref Range   PSA 0.56 0.10 - 4.00 ng/ml   Depression screen [facility] 2/9 07/03/2018 06/22/2017 09/29/2015 06/23/2014 03/22/2012  Decreased Interest 0 0 0 0 0  Down, Depressed, Hopeless 0 0 0 0 0  PHQ - 2 Score 0 0 0 0 0  Altered sleeping 0 0 - - -  Tired, decreased energy 0 0 - - -  Change in appetite 0 0 - - -  Feeling bad or failure about yourself  0 0 - - -  Trouble concentrating 0 0 - - -  Moving slowly or fidgety/restless 0 0 - - -  Suicidal thoughts 0 0 - - -  PHQ-9 Score 0 0 - - -  Difficult doing work/chores Not difficult at all Not difficult at all - - -  Some recent data might be hidden    Assessment & Plan:   Problem List Items Addressed This Visit    Persistent atrial fibrillation (HCC)    Continue coumadin followed by coumadin clinic.       HYPERCHOLESTEROLEMIA - Primary    Chronic, on zetia and lipitor. Continue. The ASCVD Risk score Mikey Bussing DC Jr., et al., 2013) failed to  calculate for the following reasons:   The valid total cholesterol range is 130 to 320 mg/dL       Relevant Orders   Comprehensive metabolic panel (Completed)   Lipid panel (Completed)   Hearing loss due to cerumen impaction, right    Bilateral cerumen impaction with right sided hearing loss. Manual disimpaction with plastic curette performed bilaterally with benefit.       Abdominal aortic atherosclerosis (HCC)    Continue statin.       AAA (abdominal aortic aneurysm) without rupture ([place])    Rpt [place] will be due 08/2021       Other Visit Diagnoses    Need for influenza vaccination       Relevant Orders   Flu Vaccine QUAD High Dose(Fluad) (Completed)   Special screening for malignant neoplasm of prostate       Relevant Orders   PSA, Medicare (Completed)       No orders of the defined types were placed in this encounter.  Orders Placed This  Encounter  Procedures  . Flu Vaccine QUAD High Dose(Fluad)  . Comprehensive metabolic panel  . Lipid panel  . PSA, Medicare   Patient Instructions  Flu shot today Labs today You are doing well today  Continue current medicines Return as needed or next year for next wellness visit.  Medicine list updated today. I've removed welchol (colesevelam) from your medicine list - make sure you're not taking this at home.  Return after 07/03/2019 for next wellness visit  Both ears cleaned out today.    Follow up plan: Return in about 9 months (around 07/18/2019) for medicare wellness visit.  Ria Bush, MD

## 2018-10-18 NOTE — Assessment & Plan Note (Signed)
Bilateral cerumen impaction with right sided hearing loss. Manual disimpaction with plastic curette performed bilaterally with benefit.

## 2018-10-18 NOTE — Assessment & Plan Note (Signed)
Chronic, on zetia and lipitor. Continue. The ASCVD Risk score Mikey Bussing DC Jr., et al., 2013) failed to calculate for the following reasons:   The valid total cholesterol range is 130 to 320 mg/dL

## 2018-10-18 NOTE — Patient Instructions (Addendum)
Flu shot today Labs today You are doing well today  Continue current medicines Return as needed or next year for next wellness visit.  Medicine list updated today. I've removed welchol (colesevelam) from your medicine list - make sure you're not taking this at home.  Return after 07/03/2019 for next wellness visit  Both ears cleaned out today.

## 2018-10-18 NOTE — Assessment & Plan Note (Signed)
Rpt [place] will be due 08/2021

## 2018-10-18 NOTE — Assessment & Plan Note (Signed)
Continue statin. 

## 2018-10-18 NOTE — Assessment & Plan Note (Signed)
Continue coumadin followed by coumadin clinic.

## 2018-10-22 ENCOUNTER — Encounter: Payer: Self-pay | Admitting: *Deleted

## 2018-10-24 ENCOUNTER — Ambulatory Visit (INDEPENDENT_AMBULATORY_CARE_PROVIDER_SITE_OTHER): Payer: Medicare Other

## 2018-10-24 ENCOUNTER — Other Ambulatory Visit: Payer: Self-pay

## 2018-10-24 DIAGNOSIS — Z5181 Encounter for therapeutic drug level monitoring: Secondary | ICD-10-CM

## 2018-10-24 DIAGNOSIS — Z86718 Personal history of other venous thrombosis and embolism: Secondary | ICD-10-CM | POA: Diagnosis not present

## 2018-10-24 LAB — POCT INR: INR: 1.7 — AB (ref 2.0–3.0)

## 2018-10-24 NOTE — Patient Instructions (Signed)
Please take 1.5 tablets tonight, then continue taking 1 tablet daily except 1.5 on Mondays and Fridays.  Recheck in 6 weeks.

## 2018-11-06 ENCOUNTER — Other Ambulatory Visit: Payer: Self-pay | Admitting: Cardiovascular Disease

## 2018-11-06 ENCOUNTER — Other Ambulatory Visit: Payer: Self-pay | Admitting: Family Medicine

## 2018-11-06 NOTE — Telephone Encounter (Signed)
Please review for refill on Warfarin. THanks!

## 2018-12-03 ENCOUNTER — Other Ambulatory Visit: Payer: Self-pay | Admitting: Cardiovascular Disease

## 2018-12-05 ENCOUNTER — Ambulatory Visit (INDEPENDENT_AMBULATORY_CARE_PROVIDER_SITE_OTHER): Payer: Medicare Other

## 2018-12-05 ENCOUNTER — Other Ambulatory Visit: Payer: Self-pay

## 2018-12-05 DIAGNOSIS — Z5181 Encounter for therapeutic drug level monitoring: Secondary | ICD-10-CM | POA: Diagnosis not present

## 2018-12-05 DIAGNOSIS — Z86718 Personal history of other venous thrombosis and embolism: Secondary | ICD-10-CM

## 2018-12-05 LAB — POCT INR: INR: 2.2 (ref 2.0–3.0)

## 2018-12-05 NOTE — Patient Instructions (Signed)
Please continue taking 1 tablet daily except 1.5 on Mondays and Fridays.  Recheck in 7 weeks.

## 2018-12-20 ENCOUNTER — Other Ambulatory Visit: Payer: Self-pay

## 2018-12-20 ENCOUNTER — Encounter: Payer: Self-pay | Admitting: Family Medicine

## 2018-12-20 ENCOUNTER — Ambulatory Visit (INDEPENDENT_AMBULATORY_CARE_PROVIDER_SITE_OTHER): Payer: Medicare Other | Admitting: Family Medicine

## 2018-12-20 DIAGNOSIS — Z638 Other specified problems related to primary support group: Secondary | ICD-10-CM | POA: Diagnosis not present

## 2018-12-20 NOTE — Progress Notes (Signed)
   Anthony Kim. - 73 y.o. male  MRN [id number]  Date of Birth: [birth year]  PCP: Ria Bush, MD  This service was provided via telemedicine. Phone Visit performed on 12/20/2018    Rationale for phone visit along with limitations reviewed. Patient consented to telephone encounter.    Location of patient: at home, son also present on call Location of provider: in office, [place] [facility] Name of referring provider: N/A  Names of persons and role in encounter: Provider: Ria Bush, MD  Patient: Anthony Kim.  Other: son [name]   Time on call: 8:05am - 8:33am    Subjective: Chief Complaint  Patient presents with  . Other    Wants to discuss concerns of wife's mental state.  Concerned about gabapentin's side effects for her and stopped it on 12/09/18. Son, [name] will be involved with visit.      HPI:  Pt calls today with his son concerned about change in mental status - pt has noted behavioral and personality changes - increased agitation and emotional lability - cries for long periods of time. Noticed more trouble with overspending. We had recently started gabapentin, this was stopped 12/09/2018. Continues having trouble with handwriting. Eating well.   She has continued outpatient PT with some benefit in improving weakness but persistent generalized weakness. Continued trouble with ADLs - husband even has to help her get dressed. High caregiver burden. They decided to defer [place] at this time as she is doing so well with outpatient PT and really enjoys her outpatient therapist.   Husband and son remain concerned about husband's ability to care for patient and feel she may require male assistant due to need for assistance with ADLs.   Chart review - saw neurology last month, she had recent reassuring MRI for noted slurred speech. Next appt with Dr Jannifer Franklin is 01/08/2019.    Objective/Observations:  No physical exam or vital signs collected unless specifically  identified below.   Ht 5' 10.5" (1.791 m)   Wt 215 lb (97.5 kg)   BMI 30.41 kg/m    No physical performed.  Assessment/Plan:  Caregiver role strain Husband and son wanted to touch base with me separate from wife about ongoing concerns.  Support provided. Discussed option of [place] after completes outpatient physical therapy as well as hiring personal care aide out of pocket - they have friend that does this and will touch base with her.  Discussed difficult to tease out dementia vs pseudodementia from depression. I suggested they talk with Webb Silversmith and see if willing to try second antidepressant adjuvant to help mood. Will also be interested to see Dr Jannifer Franklin thoughts on dementia vs pseudodementia regarding behavioral/personality changes.  Pt/son agree with plan.    I discussed the assessment and treatment plan with the patient. The patient was provided an opportunity to ask questions and all were answered. The patient agreed with the plan and demonstrated an understanding of the instructions.  Lab Orders  No laboratory test(s) ordered today    No orders of the defined types were placed in this encounter.   The patient was advised to call back or seek an in-person evaluation if the symptoms worsen or if the condition fails to improve as anticipated.  Ria Bush, MD

## 2018-12-21 DIAGNOSIS — Z638 Other specified problems related to primary support group: Secondary | ICD-10-CM | POA: Insufficient documentation

## 2018-12-21 NOTE — Assessment & Plan Note (Signed)
Husband and son wanted to touch base with me separate from wife about ongoing concerns.  Support provided. Discussed option of [place] after completes outpatient physical therapy as well as hiring personal care aide out of pocket - they have friend that does this and will touch base with her.  Discussed difficult to tease out dementia vs pseudodementia from depression. I suggested they talk with Webb Silversmith and see if willing to try second antidepressant adjuvant to help mood. Will also be interested to see Dr Jannifer Franklin thoughts on dementia vs pseudodementia regarding behavioral/personality changes.  Pt/son agree with plan.

## 2018-12-27 ENCOUNTER — Other Ambulatory Visit: Payer: Self-pay | Admitting: Cardiovascular Disease

## 2018-12-27 NOTE — Telephone Encounter (Signed)
Refill request

## 2018-12-31 ENCOUNTER — Other Ambulatory Visit: Payer: Self-pay | Admitting: Family Medicine

## 2018-12-31 NOTE — Telephone Encounter (Signed)
Name of Medication: Alprazolam Name of Pharmacy: [facility] or Written Date and Quantity: 12/03/18, #30 Last Office Visit and Type: 12/20/18, caregiver role strain Next Office Visit and Type: none Last Controlled Substance Agreement Date: 01/24/17 Last UDS: 01/24/17

## 2018-12-31 NOTE — Telephone Encounter (Signed)
ERx 

## 2019-01-18 DIAGNOSIS — I714 Abdominal aortic aneurysm, without rupture, unspecified: Secondary | ICD-10-CM

## 2019-01-18 HISTORY — DX: Abdominal aortic aneurysm, without rupture, unspecified: I71.40

## 2019-01-23 ENCOUNTER — Other Ambulatory Visit: Payer: Self-pay

## 2019-01-23 ENCOUNTER — Ambulatory Visit (INDEPENDENT_AMBULATORY_CARE_PROVIDER_SITE_OTHER): Payer: Medicare Other

## 2019-01-23 DIAGNOSIS — Z86718 Personal history of other venous thrombosis and embolism: Secondary | ICD-10-CM | POA: Diagnosis not present

## 2019-01-23 DIAGNOSIS — Z5181 Encounter for therapeutic drug level monitoring: Secondary | ICD-10-CM

## 2019-01-23 LAB — POCT INR: INR: 2.3 (ref 2.0–3.0)

## 2019-01-23 NOTE — Patient Instructions (Signed)
Please continue taking 1 tablet daily except 1.5 on Mondays and Fridays.  Recheck in 6 weeks.

## 2019-01-28 ENCOUNTER — Other Ambulatory Visit: Payer: Self-pay | Admitting: Family Medicine

## 2019-01-28 NOTE — Telephone Encounter (Signed)
Name of Medication: Alprazolam Name of Pharmacy: [facility] or Written Date and Quantity: 12/31/18, #30 Last Office Visit and Type: 10/18/18, CPE prt 2 Next Office Visit and Type: none Last Controlled Substance Agreement Date: 02/03/17 Last UDS:  02/03/17

## 2019-01-29 ENCOUNTER — Other Ambulatory Visit: Payer: Self-pay | Admitting: Cardiovascular Disease

## 2019-01-30 ENCOUNTER — Other Ambulatory Visit: Payer: Self-pay | Admitting: Cardiovascular Disease

## 2019-01-30 NOTE — Telephone Encounter (Signed)
ERx 

## 2019-02-22 ENCOUNTER — Other Ambulatory Visit: Payer: Self-pay | Admitting: Cardiovascular Disease

## 2019-02-25 ENCOUNTER — Other Ambulatory Visit: Payer: Self-pay | Admitting: Cardiovascular Disease

## 2019-02-25 NOTE — Telephone Encounter (Signed)
Please review for refill, Thanks !  

## 2019-02-25 NOTE — Telephone Encounter (Signed)
Refill Request.  

## 2019-02-26 ENCOUNTER — Other Ambulatory Visit: Payer: Self-pay | Admitting: Family Medicine

## 2019-02-26 NOTE — Telephone Encounter (Signed)
Name of Medication: Alprazolam Name of Pharmacy: [facility] or Written Date and Quantity: 01/30/19, #30 Last Office Visit and Type: 12/20/18, caregiver role strain Next Office Visit and Type: none Last Controlled Substance Agreement Date: 01/24/17 Last UDS: 01/24/17

## 2019-02-27 NOTE — Telephone Encounter (Signed)
ERx 

## 2019-02-28 ENCOUNTER — Other Ambulatory Visit: Payer: Self-pay | Admitting: Cardiovascular Disease

## 2019-03-06 ENCOUNTER — Other Ambulatory Visit: Payer: Self-pay

## 2019-03-06 ENCOUNTER — Ambulatory Visit (INDEPENDENT_AMBULATORY_CARE_PROVIDER_SITE_OTHER): Payer: Medicare Other

## 2019-03-06 DIAGNOSIS — Z86718 Personal history of other venous thrombosis and embolism: Secondary | ICD-10-CM | POA: Diagnosis not present

## 2019-03-06 DIAGNOSIS — Z5181 Encounter for therapeutic drug level monitoring: Secondary | ICD-10-CM | POA: Diagnosis not present

## 2019-03-06 LAB — POCT INR: INR: 2.6 (ref 2.0–3.0)

## 2019-03-06 NOTE — Patient Instructions (Signed)
Please continue taking 1 tablet warfarin daily except 1.5 on Mondays and Fridays.  Recheck in 6 weeks.  

## 2019-03-11 ENCOUNTER — Telehealth: Payer: Self-pay | Admitting: Cardiovascular Disease

## 2019-03-11 NOTE — Telephone Encounter (Signed)
   [facility] Pre-operative Risk Assessment    Request for surgical clearance:  1. What type of surgery is being performed? Dental implant placement #30   2. When is this surgery scheduled? TBD  3. What type of clearance is required (medical clearance vs. Pharmacy clearance to hold med vs. Both)? both  4. Are there any medications that need to be held prior to surgery and how long? Please advise if patient will need to discontinue blood thinner and for how many days? - when to resume.  5. Practice name and name of physician performing surgery? [facility]  6. What is your office phone number [phone]   7.   What is your office fax number [phone]  8.   Anesthesia type (None, local, MAC, general) ? Not listed    Ace Gins 03/11/2019, 4:27 PM  _________________________________________________________________   (provider comments below)

## 2019-03-12 NOTE — Telephone Encounter (Signed)
   Primary Cardiologist: Ida Rogue, MD  Chart reviewed as part of pre-operative protocol coverage. Patient was contacted 03/12/2019 in reference to pre-operative risk assessment for pending surgery as outlined below.  Shuan Kubick. was last seen on 02/22/18 by Dr. Rockey Situ for his atrial fib.  Since that day, Jashad Ganus. has done well with no angina.   Pt at low risk for complications with dental implant.   We do not recommend stopping coumadin for dental implant.  No antibiotics needed from cardiac perspective.   Therefore, based on ACC/AHA guidelines, the patient would be at acceptable risk for the planned procedure without further cardiovascular testing.   I will route this recommendation to the requesting party via Epic fax function and remove from pre-op pool.  Please call with questions.  Cecilie Kicks, NP 03/12/2019, 10:50 AM

## 2019-03-12 NOTE — Telephone Encounter (Signed)
Pharm, please address coumadin for dental implant.  Thanks.

## 2019-03-12 NOTE — Telephone Encounter (Signed)
Patient with diagnosis of DVT/afib on warfarin for anticoagulation.    Procedure: dental implant Date of procedure: TBD  CHADS2-VASc score of  3 (HTN, AGE CAD)  Of note patient does have a history of DVT in 2010  Patient does NOT need to hold his coumadin for dental implant

## 2019-03-23 DIAGNOSIS — Z23 Encounter for immunization: Secondary | ICD-10-CM | POA: Diagnosis not present

## 2019-03-25 ENCOUNTER — Other Ambulatory Visit: Payer: Self-pay | Admitting: Cardiovascular Disease

## 2019-03-26 ENCOUNTER — Other Ambulatory Visit: Payer: Self-pay | Admitting: Family Medicine

## 2019-03-26 NOTE — Telephone Encounter (Signed)
Name of Medication: Alprazolam Name of Pharmacy: [facility] or Written Date and Quantity: 02/27/19, #30 Last Office Visit and Type: 12/20/18, caregiver role strain Next Office Visit and Type: none Last Controlled Substance Agreement Date: 01/24/17 Last UDS: 01/24/17

## 2019-03-27 ENCOUNTER — Other Ambulatory Visit: Payer: Self-pay | Admitting: Cardiovascular Disease

## 2019-03-28 NOTE — Telephone Encounter (Signed)
ERx 

## 2019-03-29 ENCOUNTER — Other Ambulatory Visit: Payer: Self-pay | Admitting: Cardiovascular Disease

## 2019-04-15 NOTE — Progress Notes (Signed)
Cardiology Office Note  Date:  04/16/2019   ID:  Musaab Geddis., DOB Dec 17, 1945, MRN [id number]  PCP:  Ria Bush, MD   Chief Complaint  Patient presents with  . other    12 month follow up. Meds reviewed by the pt. verbally. "doing well."     HPI:  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 Aortic Atherosclerosis also noted  Active on the farm, Manages rental properties no shortness of breath PND orthopnea 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  Weight down 20 pounds in one year  Taking care of wife, she is having trouble walking She is seeing neurology Speech slur, maybe ALS, Does PT  EKG personally reviewed by myself on todays visit Shows atrial fibrillation rate 83 bpm no significant ST or T wave changes  Other past medical history reviewed Prior CT scan of the chest from 2014   LAD and left circumflex coronary artery disease  He wears compression hose given his history of DVT   PMH:   has a past medical history of Anxiety, CAD (coronary artery disease), Clotting disorder ([place]), Colon polyps, Cough, Deep vein thrombosis (DVT) ([place]) (04/06/2010), Depression,  DVT (deep venous thrombosis) ([place]), GERD (gastroesophageal reflux disease), Hyperlipidemia, Hypertension, IBS (irritable bowel syndrome), NICM (nonischemic cardiomyopathy) ([place]), Palpitations, Persistent atrial fibrillation ([place]), Pneumonia, Presence of partial dental prosthetic device, and Seasonal allergies.  PSH:    Past Surgical History:  Procedure Laterality Date  . APPENDECTOMY     1967  . CARDIOVERSION N/A 08/03/2016   Procedure: CARDIOVERSION;  Surgeon: Minna Merritts, MD;  Location: [facility];  Service: Cardiovascular;  Laterality: N/A;  . CATARACT EXTRACTION W/PHACO Right 07/08/2015   Procedure: CATARACT EXTRACTION PHACO AND INTRAOCULAR LENS PLACEMENT ([place]) RIGHT EYE;  Surgeon: Leandrew Koyanagi, MD;  Location: [facility];  Service: Ophthalmology;  Laterality: Right;   SYMFONY LENS  . CATARACT EXTRACTION W/PHACO Left 08/05/2015   Procedure: CATARACT EXTRACTION PHACO AND INTRAOCULAR LENS PLACEMENT ([place]) left eye;  Surgeon: Leandrew Koyanagi, MD;  Location: [facility];  Service: Ophthalmology;  Laterality: Left;  SYMFONY LENS  . CATARACT EXTRACTION, BILATERAL Bilateral 08/2015  . COLONOSCOPY     x3  . POLYPECTOMY    . TONSILLECTOMY AND ADENOIDECTOMY      Current Outpatient Medications  Medication Sig Dispense Refill  . acetaminophen (TYLENOL) 325 MG tablet Take 325 mg by mouth every 6 (six) hours as needed for pain.     Marland Kitchen ALLERGY RELIEF 180 MG tablet TAKE ONE TABLET DAILY FOR CONGESTION. 30 tablet 5  . ALPRAZolam (XANAX XR) 0.5 MG 24 hr tablet Take 1 tablet (0.5 mg total) by mouth daily. 30 tablet 0  . AMBULATORY NON FORMULARY MEDICATION Medication Name:     nitroglycerin 0.125% . You should apply  a pea size amount to your rectum every 8 hours  [facility] information is below: Address: [address], [place], [place] [address]  [phone]  *Please DO NOT go directly from our office to pick up this medication! Give the pharmacy 1 day  to process the prescription as this is compounded at takes time to make. 30 g 1  . atorvastatin (LIPITOR) 40 MG tablet Take 1 tablet (40 mg total) by mouth daily at 6 PM. 90 tablet 0  . buPROPion (WELLBUTRIN XL) 150 MG 24 hr tablet Take 1 tablet (150 mg total) by mouth daily. 90 tablet 3  . cloNIDine (CATAPRES) 0.1 MG tablet Take 1 tablet (0.1 mg total) by mouth 2 (two) times daily. 60 tablet 0  . ezetimibe (ZETIA) 10 MG tablet Take 1 tablet (10 mg total) by mouth daily. 90 tablet 0  . meclizine (ANTIVERT) 25 MG tablet Take 25 mg by mouth 3 (three) times daily as needed for dizziness. Reported on 07/01/2015    . metoprolol tartrate (LOPRESSOR) 25 MG tablet Take 1 tablet (25 mg total) by mouth 2 (two) times daily. 180 tablet 3  . Multiple Vitamin (MULTIVITAMIN PO) Take 1 tablet by mouth daily. pm    . omeprazole (PRILOSEC) 40 MG capsule Take 1 capsule (40 mg total) by mouth daily. 30 capsule 0  . polyethylene glycol (MIRALAX / GLYCOLAX) packet Take 17 g by mouth daily.    Marland Kitchen warfarin (COUMADIN) 5 MG tablet Take up to 1.5 tablets per day by mouth as directed by the coumadin clinic. 40 tablet 0   No current facility-administered medications for this visit.     Allergies:   Iodine   Social History:  The patient  reports that he quit smoking about 27 years ago. His smoking use included cigarettes. He has a 30.00 pack-year smoking history. He has never used smokeless tobacco. He reports current alcohol use of about 7.0 standard drinks of alcohol per week. He reports that he does not use drugs.   Family History:   family history includes Heart attack in his father; Heart disease in his mother; Hypertension in his father and mother; Irritable bowel syndrome in his mother; Prostate cancer in his father.    Review of Systems: Review of Systems  Constitutional: Negative.   Respiratory: Negative.   Cardiovascular: Positive for leg swelling.  Gastrointestinal: Negative.   Musculoskeletal: Negative.    Neurological: Negative.   Psychiatric/Behavioral: Negative.   All other systems reviewed and are negative.   PHYSICAL EXAM: VS:  BP (!) 146/92 (BP Location: Right Arm, Patient Position: Sitting, Cuff Size: Normal)   Pulse 83   Ht 5\' 11"  (1.803 m)   Wt 224 lb (101.6 kg)   SpO2 98%   BMI 31.24 kg/m  , BMI Body mass index is 31.24 kg/m.  Constitutional:  oriented to person, place, and time. No distress.  HENT:  Head: Grossly normal Eyes:  no discharge. No scleral icterus.  Neck: No JVD, no carotid bruits  Cardiovascular: Regular rate and rhythm, no murmurs appreciated Pulmonary/Chest: Clear to auscultation bilaterally, no wheezes or rails Abdominal: Soft.  no distension.  no tenderness.  Musculoskeletal: Normal range of motion Neurological:  normal muscle tone. Coordination normal. No atrophy Skin: Skin warm and dry Psychiatric: normal affect, pleasant   Recent Labs: 09/16/2018: Hemoglobin 15.0; Platelets 152 10/18/2018: ALT 38; BUN 25; Creatinine, Ser 1.03; Potassium 4.9; Sodium 142    Lipid Panel Lab Results  Component Value Date  CHOL 100 10/18/2018   HDL 35.60 (L) 10/18/2018   LDLCALC 49 10/18/2018   TRIG 79.0 10/18/2018    Wt Readings from Last 3 Encounters:  04/16/19 224 lb (101.6 kg)  12/20/18 215 lb (97.5 kg)  10/18/18 220 lb 9 oz (100 kg)    ASSESSMENT AND PLAN:   Atrial fibrillation,persistent  declined repeat cardioversion Asymptomatic atrial fibrillation Rate well controlled, tolerating anticoagulation  does not want a NOAC  Essential hypertension -  Blood pressure relatively well controlled, no changes made stable  HYPERCHOLESTEROLEMIA Cholesterol at goal  on Lipitor,  Zetia No changes made  Localized edema Chronic lower extremity edema, stable  On warfarin for history of DVT He wears compression hose  History of DVT On anticoagulation  Obesity Weight trending upwards, recommended low carbohydrate diet    Total encounter time  more than 25 minutes  Greater than 50% was spent in counseling and coordination of care with the patient   Disposition:   F/U  12 months   No orders of the defined types were placed in this encounter.    Signed, Esmond Plants, M.D., Ph.D. 04/16/2019  [place], [place]

## 2019-04-16 ENCOUNTER — Other Ambulatory Visit: Payer: Self-pay | Admitting: *Deleted

## 2019-04-16 ENCOUNTER — Ambulatory Visit (INDEPENDENT_AMBULATORY_CARE_PROVIDER_SITE_OTHER): Payer: Medicare Other | Admitting: *Deleted

## 2019-04-16 ENCOUNTER — Encounter: Payer: Self-pay | Admitting: Cardiovascular Disease

## 2019-04-16 ENCOUNTER — Other Ambulatory Visit: Payer: Self-pay

## 2019-04-16 ENCOUNTER — Ambulatory Visit (INDEPENDENT_AMBULATORY_CARE_PROVIDER_SITE_OTHER): Payer: Medicare Other | Admitting: Cardiovascular Disease

## 2019-04-16 VITALS — BP 146/92 | HR 83 | Ht 71.0 in | Wt 224.0 lb

## 2019-04-16 DIAGNOSIS — I82491 Acute embolism and thrombosis of other specified deep vein of right lower extremity: Secondary | ICD-10-CM

## 2019-04-16 DIAGNOSIS — I4891 Unspecified atrial fibrillation: Secondary | ICD-10-CM | POA: Diagnosis not present

## 2019-04-16 DIAGNOSIS — I714 Abdominal aortic aneurysm, without rupture, unspecified: Secondary | ICD-10-CM

## 2019-04-16 DIAGNOSIS — I4819 Other persistent atrial fibrillation: Secondary | ICD-10-CM

## 2019-04-16 DIAGNOSIS — I10 Essential (primary) hypertension: Secondary | ICD-10-CM

## 2019-04-16 DIAGNOSIS — E7800 Pure hypercholesterolemia, unspecified: Secondary | ICD-10-CM

## 2019-04-16 DIAGNOSIS — Z5181 Encounter for therapeutic drug level monitoring: Secondary | ICD-10-CM

## 2019-04-16 DIAGNOSIS — Z86718 Personal history of other venous thrombosis and embolism: Secondary | ICD-10-CM

## 2019-04-16 LAB — POCT INR: INR: 2.5 (ref 2.0–3.0)

## 2019-04-16 NOTE — Progress Notes (Unsigned)
Pt was here today and his INR was 2.5

## 2019-04-16 NOTE — Patient Instructions (Signed)

## 2019-04-17 ENCOUNTER — Telehealth: Payer: Self-pay

## 2019-04-17 NOTE — Addendum Note (Signed)
Addended by: Raelene Bott, Jenkins Risdon L on: 04/17/2019 10:58 AM   Modules accepted: Orders

## 2019-04-17 NOTE — Telephone Encounter (Signed)
Left message on pt's vm that I saw that he had his INR checked in the office yesterday. Reiterated instructions to continue current warfarin dosage and recheck INR in 6 wks. Advised him that I scheduled him on 05/29/19 @ 10:45 and asked that he call back if this date or time is inconvenient.

## 2019-04-17 NOTE — Patient Instructions (Signed)
Please continue taking 1 tablet warfarin daily except 1.5 on Mondays and Fridays.  Recheck in 6 weeks.  

## 2019-04-22 ENCOUNTER — Other Ambulatory Visit: Payer: Self-pay | Admitting: Cardiovascular Disease

## 2019-04-22 NOTE — Telephone Encounter (Signed)
Please review for refill, Thanks !  

## 2019-04-23 ENCOUNTER — Other Ambulatory Visit: Payer: Self-pay | Admitting: Cardiovascular Disease

## 2019-04-23 DIAGNOSIS — Z23 Encounter for immunization: Secondary | ICD-10-CM | POA: Diagnosis not present

## 2019-04-24 ENCOUNTER — Other Ambulatory Visit: Payer: Self-pay | Admitting: Family Medicine

## 2019-04-24 NOTE — Telephone Encounter (Signed)
Name of Medication: Alprazolam Name of Pharmacy: [facility] or Written Date and Quantity: 03/28/19, #30 Last Office Visit and Type: 12/20/18, caregiver role strain Next Office Visit and Type: none Last Controlled Substance Agreement Date: 01/24/17 Last UDS: 01/24/17

## 2019-04-25 ENCOUNTER — Telehealth: Payer: Self-pay | Admitting: Family Medicine

## 2019-04-25 NOTE — Chronic Care Management (AMB) (Signed)
  Chronic Care Management   Note  04/25/2019 Name: Treyvion Edkins. MRN: [id number] DOB: 1945/08/23  Avitaj Scholler. is a 74 y.o. year old male who is a primary care patient of Ria Bush, MD. I reached out to Zachery Conch. by phone today in response to a referral sent by Mr. KEANAN BALAGOT Jr.'s PCP, Ria Bush, MD.   Mr. Norsworthy was given information about Chronic Care Management services today including:  1. CCM service includes personalized support from designated clinical staff supervised by his physician, including individualized plan of care and coordination with other care providers 2. 24/7 contact phone numbers for assistance for urgent and routine care needs. 3. Service will only be billed when office clinical staff spend 20 minutes or more in a month to coordinate care. 4. Only one practitioner may furnish and bill the service in a calendar month. 5. The patient may stop CCM services at any time (effective at the end of the month) by phone call to the office staff.   Patient agreed to services and verbal consent obtained.   Follow up plan:   Raynicia Dukes [employer] Scheduler

## 2019-04-26 ENCOUNTER — Other Ambulatory Visit: Payer: Self-pay | Admitting: Cardiovascular Disease

## 2019-04-26 NOTE — Telephone Encounter (Signed)
ERx 

## 2019-05-16 ENCOUNTER — Other Ambulatory Visit: Payer: Self-pay | Admitting: Cardiovascular Disease

## 2019-05-16 NOTE — Telephone Encounter (Signed)
Refill request

## 2019-05-19 ENCOUNTER — Telehealth: Payer: Self-pay

## 2019-05-19 DIAGNOSIS — I4819 Other persistent atrial fibrillation: Secondary | ICD-10-CM

## 2019-05-19 DIAGNOSIS — I10 Essential (primary) hypertension: Secondary | ICD-10-CM

## 2019-05-19 NOTE — Telephone Encounter (Signed)
Per written referral from PCP, requesting referral in Epic for Anthony Kim. to chronic care management pharmacy services for the following conditions:   Essential hypertension, benign  [I10]  Persistent atrial fibrillation [I48.19]  Debbora Dus, PharmD Clinical Pharmacist [facility] [phone]

## 2019-05-22 ENCOUNTER — Ambulatory Visit: Payer: Medicare Other

## 2019-05-22 ENCOUNTER — Other Ambulatory Visit: Payer: Self-pay

## 2019-05-22 DIAGNOSIS — I4819 Other persistent atrial fibrillation: Secondary | ICD-10-CM

## 2019-05-22 DIAGNOSIS — I10 Essential (primary) hypertension: Secondary | ICD-10-CM

## 2019-05-22 DIAGNOSIS — E7800 Pure hypercholesterolemia, unspecified: Secondary | ICD-10-CM

## 2019-05-22 NOTE — Chronic Care Management (AMB) (Signed)
Chronic Care Management Pharmacy  Name: Anthony Kim.  MRN: [id number] DOB: 1945-08-28  Chief Complaint/ HPI  Anthony Conch.,  74 y.o., male presents for their Initial CCM visit with the clinical pharmacist via telephone.  PCP : Ria Bush, MD  Their chronic conditions include: AFIB, depression, hypertension, hyperlipidemia, sleep apnea, renal insufficiency, ED  Patient concerns: denies concerns, reports doing well   Office Visits:  12/20/18: Anthony Kim - caregiver strain - wife, defer [place] at this time, doing well with PT  10/18/18: Cont current meds  Consult Visit:  04/16/19 -Cardio - INR WNL, continue current warfarin dose, chronic edema- stable, wears compression hose  Allergies  Allergen Reactions  . Iodine Hives    Contrast dye/ betadine ok   Medications: Outpatient Encounter Medications as of 05/22/2019  Medication Sig  . acetaminophen (TYLENOL) 325 MG tablet Take 325 mg by mouth every 6 (six) hours as needed for pain.   Marland Kitchen ALLERGY RELIEF 180 MG tablet TAKE ONE TABLET DAILY FOR CONGESTION.  Marland Kitchen ALPRAZolam (XANAX XR) 0.5 MG 24 hr tablet Take 1 tablet (0.5 mg total) by mouth daily.  Marland Kitchen atorvastatin (LIPITOR) 40 MG tablet Take 1 tablet (40 mg total) by mouth daily at 6 PM.  . buPROPion (WELLBUTRIN XL) 150 MG 24 hr tablet Take 1 tablet (150 mg total) by mouth daily.  . cloNIDine (CATAPRES) 0.1 MG tablet Take 1 tablet (0.1 mg total) by mouth 2 (two) times daily.  Marland Kitchen ezetimibe (ZETIA) 10 MG tablet Take 1 tablet (10 mg total) by mouth daily.  . meclizine (ANTIVERT) 25 MG tablet Take 25 mg by mouth 3 (three) times daily as needed for dizziness. Reported on 07/01/2015  . metoprolol tartrate (LOPRESSOR) 25 MG tablet Take 1 tablet (25 mg total) by mouth 2 (two) times daily.  . Multiple Vitamin (MULTIVITAMIN PO) Take 1 tablet by mouth daily. pm  . omeprazole (PRILOSEC) 40 MG capsule Take 1 capsule (40 mg total) by mouth daily.  . polyethylene glycol (MIRALAX / GLYCOLAX)  packet Take 17 g by mouth daily.  Marland Kitchen warfarin (COUMADIN) 5 MG tablet Take up to 1.5 tablets per day by mouth as directed by the coumadin clinic.  Marland Kitchen AMBULATORY NON FORMULARY MEDICATION Medication Name:     nitroglycerin 0.125% . You should apply a pea size amount to your rectum every 8 hours  [facility] information is below: Address: [address] [place], [place] [address]  [phone]  *Please DO NOT go directly from our office to pick up this medication! Give the pharmacy 1 day to process the prescription as this is compounded at takes time to make.   No facility-administered encounter medications on file as of 05/22/2019.   Current Diagnosis/Assessment:   Emergency planning/management officer Strain: Low Risk   . Difficulty of Paying Living Expenses: Not very hard   Goals Addressed            This Visit's Progress   . Pharmacy Care Plan       CARE PLAN ENTRY  Current Barriers:  . Chronic Disease Management support, education, and care coordination needs related to Hypertension and Hyperlipidemia   Hypertension . Pharmacist Clinical Goal(s): o Over the next 6 months, patient will work with PharmD and providers to maintain BP goal <140/90 . Current regimen:  o Clonidine 0.1 mg - 1 tablet twice daily o Metoprolol tartrate 25 mg - 1 tablet twice daily . Interventions: o Reviewed medication history, per cardiology patient previously on losartan  which was discontinued due to decreased renal function . Patient self care activities - Over the next 6 month, patient will: o Check BP with symptoms, document, and provide at future appointments o Ensure daily salt intake < 2300 mg/day o Maintain weight loss with heart healthy diet o Work towards exercise goal of 30 minutes, 5 days per week  Hyperlipidemia . Pharmacist Clinical Goal(s): o Over the next 6 months, patient will work with PharmD and providers to maintain LDL goal < 100 . Current regimen:  o Atorvastatin 40 mg - 1  tablet daily o Zetia 10 mg - 1 tablet daily . Interventions: o Continue current medications . Patient self care activities - over the next 6 months, patient will: o Continue to eat diet high in vegetables, fruits and whole grains and incorporate low-fat dairy products, poultry, fish, legumes, non-tropical vegetable oils and nuts. Limit intake of sweets, sugar-sweetened beverages and red meats.  Initial goal documentation      Hypertension   CMP Latest Ref Rng & Units 10/18/2018 09/16/2018 06/22/2017  Glucose 70 - 99 mg/dL 97 117(H) 106(H)  BUN 6 - 23 mg/dL 25(H) 16 20  Creatinine 0.40 - 1.50 mg/dL 1.03 1.04 1.08  Sodium 135 - 145 mEq/L 142 139 140  Potassium 3.5 - 5.1 mEq/L 4.9 4.1 4.6  Chloride 96 - 112 mEq/L 105 104 105  CO2 19 - 32 mEq/L 30 23 29   Calcium 8.4 - 10.5 mg/dL 9.5 9.2 9.3  Total Protein 6.0 - 8.3 g/dL 6.7 7.1 6.4  Total Bilirubin 0.2 - 1.2 mg/dL 1.3(H) 1.4(H) 1.2  Alkaline Phos 39 - 117 U/L 76 68 73  AST 0 - 37 U/L 25 23 31   ALT 0 - 53 U/L 38 27 41   Office blood pressures are: BP Readings from Last 3 Encounters:  04/16/19 (!) 146/92  10/18/18 122/64  09/16/18 (!) 152/105  No UACR in chart; GFR 09/16/18 > 60  BP < 140/90 mmHg Reports frequently elevated in office, rechecks and comes back down  Patient checks BP at home infrequently Patient home BP readings are ranging: none reported Patient has tried these meds in the past: losartan Patient is currently controlled on the following medications:   Clonidine 0.1 mg - 1 tablet BID   Metoprolol tartrate 25 mg - 1 tablet BID  We discussed: reports has maintained weight loss - current weight ~215 lbs  Pertinent history: per cardio note 2017 - pt was on losartan and metoprolol, clonidine added, avoid CCB due to LLE, avoid diuretics due to renal impairment; 2018 - losartan was discontinued due to decreased GFR, continue metoprolol and clonidine  Plan: Continue current medications  Hyperlipidemia   Lipid Panel      Component Value Date/Time   CHOL 100 10/18/2018 1307   CHOL 162 01/01/2014 0852   TRIG 79.0 10/18/2018 1307   HDL 35.60 (L) 10/18/2018 1307   HDL 49 01/01/2014 0852   CHOLHDL 3 10/18/2018 1307   VLDL 15.8 10/18/2018 1307   LDLCALC 49 10/18/2018 1307   Esparto 91 01/01/2014 0852   LDLDIRECT 99.5 06/24/2013 1530   LABVLDL 22 01/01/2014 0852    The ASCVD Risk score (Goff DC Jr., et al., 2013) failed to calculate for the following reasons:   The valid total cholesterol range is 130 to 320 mg/dL   LDL goal < 100 Patient has failed these meds in past: none reported Patient is currently controlled on the following medications:   Atorvastatin 40 mg - 1 tablet  daily  Zetia 10 mg - 1 tablet daily  We discussed: diet improved recently - no longer eating fried foods/bbq  Plan: Continue current medications  AFIB   Patient is currently rate controlled. Patient has failed these meds in past: none reported Patient is currently controlled on the following medications:   Warfarin 5 mg - as directed  Metoprolol tartrate 25 mg - 1 tablet BID  We discussed: INR stable the past 6 months; denies symptoms of AFIB  Plan: Continue current medications  Depression   Patient has failed these meds in past: none reported Patient is currently controlled on the following medications:   Xanax 0.5 mg - 1 tablet daily  Bupropion 150 mg - 1 tablet daily  We discussed: Reports mood is stable, doing well  Plan: Continue current medications  Medication Management  Misc: Omeprazole 40 mg - 1 capsule daily (working well), meclizine 25 mg - TID PRN dizziness, nitroglycerin ointment per rectum   OTCs: Multivitamin, Miralax, Tylenol 325 mg, Allergy relief 180 mg daily  Pharmacy/Benefits: [facility]  Adherence: uses pillbox   Affordability: denies concerns   CCM Follow Up: 6 months (telephone) 11/21/19 at 2:00 PM  Debbora Dus, PharmD Clinical Pharmacist [facility]  at [facility] [phone]

## 2019-05-24 NOTE — Patient Instructions (Signed)
Dear Anthony Kim.,  It was a pleasure meeting you during our initial appointment on May 22, 2019. Below is a summary of the goals we discussed and components of chronic care management. Please contact me anytime with questions or concerns.   Visit Information  Goals Addressed            This Visit's Progress   . Pharmacy Care Plan       CARE PLAN ENTRY  Current Barriers:  . Chronic Disease Management support, education, and care coordination needs related to Hypertension and Hyperlipidemia   Hypertension . Pharmacist Clinical Goal(s): o Over the next 6 months, patient will work with PharmD and providers to maintain BP goal <140/90 . Current regimen:  o Clonidine 0.1 mg - 1 tablet twice daily o Metoprolol tartrate 25 mg - 1 tablet twice daily . Interventions: o Reviewed medication history, per cardiology patient previously on losartan which was discontinued due to decreased renal function . Patient self care activities - Over the next 6 month, patient will: o Check BP with symptoms, document, and provide at future appointments o Ensure daily salt intake < 2300 mg/day o Maintain weight loss with heart healthy diet o Work towards exercise goal of 30 minutes, 5 days per week  Hyperlipidemia . Pharmacist Clinical Goal(s): o Over the next 6 months, patient will work with PharmD and providers to maintain LDL goal < 100 . Current regimen:  o Atorvastatin 40 mg - 1 tablet daily o Zetia 10 mg - 1 tablet daily . Interventions: o Continue current medications . Patient self care activities - over the next 6 months, patient will: o Continue to eat diet high in vegetables, fruits and whole grains and incorporate low-fat dairy products, poultry, fish, legumes, non-tropical vegetable oils and nuts. Limit intake of sweets, sugar-sweetened beverages and red meats.  Initial goal documentation      Anthony Kim was given information about Chronic Care Management services today including:    1. CCM service includes personalized support from designated clinical staff supervised by his physician, including individualized plan of care and coordination with other care providers 2. 24/7 contact phone numbers for assistance for urgent and routine care needs. 3. Standard insurance, coinsurance, copays and deductibles apply for chronic care management only during months in which we provide at least 20 minutes of these services. Most insurances cover these services at 100%, however patients may be responsible for any copay, coinsurance and/or deductible if applicable. This service may help you avoid the need for more expensive face-to-face services. 4. Only one practitioner may furnish and bill the service in a calendar month. 5. The patient may stop CCM services at any time (effective at the end of the month) by phone call to the office staff.  Patient agreed to services and verbal consent obtained.   The patient verbalized understanding of instructions provided today and agreed to receive a mailed copy of patient instruction and/or educational materials. Telephone follow up appointment with pharmacy team member scheduled for:  11/21/19 at 2:00 PM (telephone)  Debbora Dus, PharmD Clinical Pharmacist [facility] at [facility] [phone]  [place] stands for "Dietary Approaches to Stop Hypertension." The DASH eating plan is a healthy eating plan that has been shown to reduce high blood pressure (hypertension). It may also reduce your risk for type 2 diabetes, heart disease, and stroke. The DASH eating plan may also help with weight loss. What are tips for following this plan?  General  guidelines  Avoid eating more than 2,300 mg (milligrams) of salt (sodium) a day. If you have hypertension, you may need to reduce your sodium intake to 1,500 mg a day.  Limit alcohol intake to no more than 1 drink a day for nonpregnant women and 2 drinks a day for men. One drink  equals 12 oz of beer, 5 oz of wine, or 1 oz of hard liquor.  Work with your health care provider to maintain a healthy body weight or to lose weight. Ask what an ideal weight is for you.  Get at least 30 minutes of exercise that causes your heart to beat faster (aerobic exercise) most days of the week. Activities may include walking, swimming, or biking.  Work with your health care provider or diet and nutrition specialist (dietitian) to adjust your eating plan to your individual calorie needs. Reading food labels   Check food labels for the amount of sodium per serving. Choose foods with less than 5 percent of the Daily Value of sodium. Generally, foods with less than 300 mg of sodium per serving fit into this eating plan.  To find whole grains, look for the word "whole" as the first word in the ingredient list. Shopping  Buy products labeled as "low-sodium" or "no salt added."  Buy fresh foods. Avoid canned foods and premade or frozen meals. Cooking  Avoid adding salt when cooking. Use salt-free seasonings or herbs instead of table salt or sea salt. Check with your health care provider or pharmacist before using salt substitutes.  Do not fry foods. Cook foods using healthy methods such as baking, boiling, grilling, and broiling instead.  Cook with heart-healthy oils, such as olive, canola, soybean, or sunflower oil. Meal planning  Eat a balanced diet that includes: ? 5 or more servings of fruits and vegetables each day. At each meal, try to fill half of your plate with fruits and vegetables. ? Up to 6-8 servings of whole grains each day. ? Less than 6 oz of lean meat, poultry, or fish each day. A 3-oz serving of meat is about the same size as a deck of cards. One egg equals 1 oz. ? 2 servings of low-fat dairy each day. ? A serving of nuts, seeds, or beans 5 times each week. ? Heart-healthy fats. Healthy fats called Omega-3 fatty acids are found in foods such as flaxseeds and  coldwater fish, like sardines, salmon, and mackerel.  Limit how much you eat of the following: ? Canned or prepackaged foods. ? Food that is high in trans fat, such as fried foods. ? Food that is high in saturated fat, such as fatty meat. ? Sweets, desserts, sugary drinks, and other foods with added sugar. ? Full-fat dairy products.  Do not salt foods before eating.  Try to eat at least 2 vegetarian meals each week.  Eat more home-cooked food and less restaurant, buffet, and fast food.  When eating at a restaurant, ask that your food be prepared with less salt or no salt, if possible. What foods are recommended? The items listed may not be a complete list. Talk with your dietitian about what dietary choices are best for you. Grains Whole-grain or whole-wheat bread. Whole-grain or whole-wheat pasta. Brown rice. [place]. Bulgur. Whole-grain and low-sodium cereals. Pita bread. Low-fat, low-sodium crackers. Whole-wheat flour tortillas. Vegetables Fresh or frozen vegetables (raw, steamed, roasted, or grilled). Low-sodium or reduced-sodium tomato and vegetable juice. Low-sodium or reduced-sodium tomato sauce and tomato paste. Low-sodium or reduced-sodium canned  vegetables. Fruits All fresh, dried, or frozen fruit. Canned fruit in natural juice (without added sugar). Meat and other protein foods Skinless chicken or [place]. Ground chicken or [place]. Pork with fat trimmed off. Fish and seafood. Egg whites. Dried beans, peas, or lentils. Unsalted nuts, nut butters, and seeds. Unsalted canned beans. Lean cuts of beef with fat trimmed off. Low-sodium, lean deli meat. Dairy Low-fat (1%) or fat-free (skim) milk. Fat-free, low-fat, or reduced-fat cheeses. Nonfat, low-sodium ricotta or cottage cheese. Low-fat or nonfat yogurt. Low-fat, low-sodium cheese. Fats and oils Soft margarine without trans fats. Vegetable oil. Low-fat, reduced-fat, or light mayonnaise and salad dressings (reduced-sodium).  Canola, safflower, olive, soybean, and sunflower oils. Avocado. Seasoning and other foods Herbs. Spices. Seasoning mixes without salt. Unsalted popcorn and pretzels. Fat-free sweets. What foods are not recommended? The items listed may not be a complete list. Talk with your dietitian about what dietary choices are best for you. Grains Baked goods made with fat, such as croissants, muffins, or some breads. Dry pasta or rice meal packs. Vegetables Creamed or fried vegetables. Vegetables in a cheese sauce. Regular canned vegetables (not low-sodium or reduced-sodium). Regular canned tomato sauce and paste (not low-sodium or reduced-sodium). Regular tomato and vegetable juice (not low-sodium or reduced-sodium). Angie Fava. Olives. Fruits Canned fruit in a light or heavy syrup. Fried fruit. Fruit in cream or butter sauce. Meat and other protein foods Fatty cuts of meat. Ribs. Fried meat. Berniece Salines. Sausage. Bologna and other processed lunch meats. Salami. Fatback. Hotdogs. Bratwurst. Salted nuts and seeds. Canned beans with added salt. Canned or smoked fish. Whole eggs or egg yolks. Chicken or [place] with skin. Dairy Whole or 2% milk, cream, and half-and-half. Whole or full-fat cream cheese. Whole-fat or sweetened yogurt. Full-fat cheese. Nondairy creamers. Whipped toppings. Processed cheese and cheese spreads. Fats and oils Butter. Stick margarine. Lard. Shortening. Ghee. Bacon fat. Tropical oils, such as coconut, palm kernel, or palm oil. Seasoning and other foods Salted popcorn and pretzels. Onion salt, garlic salt, seasoned salt, table salt, and sea salt. Worcestershire sauce. Tartar sauce. Barbecue sauce. Teriyaki sauce. Soy sauce, including reduced-sodium. Steak sauce. Canned and packaged gravies. Fish sauce. Oyster sauce. Cocktail sauce. Horseradish that you find on the shelf. Ketchup. Mustard. Meat flavorings and tenderizers. Bouillon cubes. Hot sauce and Tabasco sauce. Premade or packaged marinades.  Premade or packaged taco seasonings. Relishes. Regular salad dressings. Where to find more information:  National Heart, Lung, and [place]: https://wilson-eaton.com/  American Heart Association: www.heart.org Summary  The DASH eating plan is a healthy eating plan that has been shown to reduce high blood pressure (hypertension). It may also reduce your risk for type 2 diabetes, heart disease, and stroke.  With the DASH eating plan, you should limit salt (sodium) intake to 2,300 mg a day. If you have hypertension, you may need to reduce your sodium intake to 1,500 mg a day.  When on the DASH eating plan, aim to eat more fresh fruits and vegetables, whole grains, lean proteins, low-fat dairy, and heart-healthy fats.  Work with your health care provider or diet and nutrition specialist (dietitian) to adjust your eating plan to your individual calorie needs. This information is not intended to replace advice given to you by your health care provider. Make sure you discuss any questions you have with your health care provider. Document Revised: 12/16/2016 Document Reviewed: 12/28/2015 Elsevier Patient Education  2020 [employer].

## 2019-05-27 NOTE — Progress Notes (Signed)
I have collaborated with the care management provider regarding care management and care coordination activities outlined in this encounter and have reviewed this encounter including documentation in the note and care plan. I am certifying that I agree with the content of this note and encounter as supervising physician.  

## 2019-06-05 ENCOUNTER — Ambulatory Visit (INDEPENDENT_AMBULATORY_CARE_PROVIDER_SITE_OTHER): Payer: Medicare Other

## 2019-06-05 ENCOUNTER — Other Ambulatory Visit: Payer: Self-pay

## 2019-06-05 DIAGNOSIS — Z5181 Encounter for therapeutic drug level monitoring: Secondary | ICD-10-CM | POA: Diagnosis not present

## 2019-06-05 DIAGNOSIS — Z86718 Personal history of other venous thrombosis and embolism: Secondary | ICD-10-CM

## 2019-06-05 LAB — POCT INR: INR: 2.7 (ref 2.0–3.0)

## 2019-06-05 NOTE — Patient Instructions (Signed)
Please continue taking 1 tablet warfarin daily except 1.5 on Mondays and Fridays.  Recheck in 6 weeks.  

## 2019-06-24 ENCOUNTER — Other Ambulatory Visit: Payer: Self-pay | Admitting: Cardiovascular Disease

## 2019-07-03 DIAGNOSIS — H6123 Impacted cerumen, bilateral: Secondary | ICD-10-CM | POA: Diagnosis not present

## 2019-07-03 DIAGNOSIS — H903 Sensorineural hearing loss, bilateral: Secondary | ICD-10-CM | POA: Diagnosis not present

## 2019-07-17 ENCOUNTER — Other Ambulatory Visit: Payer: Self-pay

## 2019-07-17 ENCOUNTER — Ambulatory Visit (INDEPENDENT_AMBULATORY_CARE_PROVIDER_SITE_OTHER): Payer: Medicare Other

## 2019-07-17 DIAGNOSIS — Z5181 Encounter for therapeutic drug level monitoring: Secondary | ICD-10-CM

## 2019-07-17 DIAGNOSIS — Z86718 Personal history of other venous thrombosis and embolism: Secondary | ICD-10-CM | POA: Diagnosis not present

## 2019-07-17 LAB — POCT INR: INR: 2.4 (ref 2.0–3.0)

## 2019-07-17 NOTE — Patient Instructions (Signed)
Please continue taking 1 tablet warfarin daily except 1.5 on Mondays and Fridays.  Recheck in 6 weeks.

## 2019-07-17 NOTE — Progress Notes (Signed)
Pt's wife was recently dx w/ ALS & has been moved into [facility], as he could not care for her alone.  Pt spent almost an hour in my office talking about her, how she's doing and how he is coping w/ her being away from home.  He is not sleeping in his bed b/c he hears her at night, even though she is not here.  Pt is sad, but does report that his wife has 3 sisters that visit her everyday, he stays w/ her for about 5 hrs every day, assists her w/ her meals and does have a support system.  Allowed pt to verbalize his feelings and provided emotional support.

## 2019-08-19 ENCOUNTER — Other Ambulatory Visit: Payer: Self-pay | Admitting: Family Medicine

## 2019-08-19 ENCOUNTER — Other Ambulatory Visit: Payer: Self-pay | Admitting: Cardiovascular Disease

## 2019-08-19 NOTE — Telephone Encounter (Signed)
ERx 

## 2019-08-19 NOTE — Telephone Encounter (Signed)
Electronic refill request. Alprazolam Last office visit:   12/20/2018 Last Filled:    30 tablet 3 04/26/2019  Please advise.

## 2019-08-28 ENCOUNTER — Other Ambulatory Visit: Payer: Self-pay

## 2019-08-28 ENCOUNTER — Ambulatory Visit (INDEPENDENT_AMBULATORY_CARE_PROVIDER_SITE_OTHER): Payer: Medicare Other

## 2019-08-28 DIAGNOSIS — Z5181 Encounter for therapeutic drug level monitoring: Secondary | ICD-10-CM

## 2019-08-28 DIAGNOSIS — Z86718 Personal history of other venous thrombosis and embolism: Secondary | ICD-10-CM | POA: Diagnosis not present

## 2019-08-28 LAB — POCT INR: INR: 3.3 — AB (ref 2.0–3.0)

## 2019-08-28 NOTE — Patient Instructions (Signed)
-   skip warfarin tonight,  - then resume taking 1 tablet warfarin daily except 1.5 on Mondays and Fridays.  - recheck in 6 weeks.

## 2019-09-16 ENCOUNTER — Other Ambulatory Visit: Payer: Self-pay | Admitting: Family Medicine

## 2019-09-16 NOTE — Telephone Encounter (Signed)
ERx 

## 2019-09-24 ENCOUNTER — Other Ambulatory Visit: Payer: Self-pay | Admitting: Family Medicine

## 2019-09-25 NOTE — Telephone Encounter (Signed)
E-scribed refill.  Plz schedule wellness, cpe and lab visits.  

## 2019-10-14 ENCOUNTER — Other Ambulatory Visit: Payer: Self-pay | Admitting: Family Medicine

## 2019-10-14 NOTE — Telephone Encounter (Signed)
ERx 

## 2019-10-16 ENCOUNTER — Other Ambulatory Visit: Payer: Self-pay

## 2019-10-16 ENCOUNTER — Ambulatory Visit (INDEPENDENT_AMBULATORY_CARE_PROVIDER_SITE_OTHER): Payer: Medicare Other

## 2019-10-16 DIAGNOSIS — Z86718 Personal history of other venous thrombosis and embolism: Secondary | ICD-10-CM

## 2019-10-16 DIAGNOSIS — Z5181 Encounter for therapeutic drug level monitoring: Secondary | ICD-10-CM

## 2019-10-16 LAB — POCT INR: INR: 3.5 — AB (ref 2.0–3.0)

## 2019-10-16 NOTE — Patient Instructions (Addendum)
-   skip warfarin tonight,  - then START NEW WARFARIN DOSAGE of 1 tablet warfarin daily - recheck in 4 weeks.

## 2019-10-21 ENCOUNTER — Telehealth: Payer: Self-pay

## 2019-10-21 NOTE — Telephone Encounter (Signed)
Patient calling.  Stated that he was here last week to get his Coumadin checked.  He stated he needed to verify the instructions.   Please call [phone]

## 2019-10-21 NOTE — Telephone Encounter (Signed)
Spoke w/ pt.  Clarified that his warfarin dosage was decreased at his last visit to 1 tablet every day. He verbalizes understanding and is appreciative of the call.

## 2019-10-30 ENCOUNTER — Other Ambulatory Visit: Payer: Self-pay

## 2019-10-30 ENCOUNTER — Ambulatory Visit (INDEPENDENT_AMBULATORY_CARE_PROVIDER_SITE_OTHER): Payer: Medicare Other

## 2019-10-30 DIAGNOSIS — Z23 Encounter for immunization: Secondary | ICD-10-CM

## 2019-11-11 ENCOUNTER — Other Ambulatory Visit: Payer: Self-pay | Admitting: Family Medicine

## 2019-11-11 NOTE — Telephone Encounter (Signed)
Refill request Alprazolam Last refill 10/11/19 #30 Last office visit 12/20/18 No upcoming appointment scheduled

## 2019-11-12 NOTE — Telephone Encounter (Signed)
ERx 

## 2019-11-13 ENCOUNTER — Other Ambulatory Visit: Payer: Self-pay

## 2019-11-13 ENCOUNTER — Ambulatory Visit (INDEPENDENT_AMBULATORY_CARE_PROVIDER_SITE_OTHER): Payer: Medicare Other

## 2019-11-13 DIAGNOSIS — Z5181 Encounter for therapeutic drug level monitoring: Secondary | ICD-10-CM

## 2019-11-13 DIAGNOSIS — Z86718 Personal history of other venous thrombosis and embolism: Secondary | ICD-10-CM | POA: Diagnosis not present

## 2019-11-13 LAB — POCT INR: INR: 2.3 (ref 2.0–3.0)

## 2019-11-13 NOTE — Patient Instructions (Signed)
-   continue dosage of 1 tablet warfarin daily - recheck in 5 weeks.

## 2019-11-21 ENCOUNTER — Other Ambulatory Visit: Payer: Self-pay

## 2019-11-21 ENCOUNTER — Ambulatory Visit: Payer: Medicare Other

## 2019-11-21 DIAGNOSIS — E7800 Pure hypercholesterolemia, unspecified: Secondary | ICD-10-CM

## 2019-11-21 DIAGNOSIS — Z23 Encounter for immunization: Secondary | ICD-10-CM | POA: Diagnosis not present

## 2019-11-21 DIAGNOSIS — I10 Essential (primary) hypertension: Secondary | ICD-10-CM

## 2019-11-21 DIAGNOSIS — F331 Major depressive disorder, recurrent, moderate: Secondary | ICD-10-CM

## 2019-11-21 NOTE — Chronic Care Management (AMB) (Signed)
Chronic Care Management Pharmacy  Name: Anthony Kim.  MRN: [id number] DOB: 02-19-1945  Chief Complaint/ HPI  Anthony Kim.,  74 y.o., male presents for their Follow-Up CCM visit with the clinical pharmacist via telephone.  PCP : Ria Bush, MD  Their chronic conditions include: AFIB, depression, hypertension, hyperlipidemia, sleep apnea, renal insufficiency, ED  Patient concerns: feeling down with wife's illness, asked about 'renal failure' diagnosis on chart    Office Visits: none since last CCM on 05/22/19  12/20/18: PCP visit Danise Mina - caregiver strain - wife, defer [place] at this time, doing well with PT  Consult Visit:  04/16/19: Cardio - INR WNL, continue current warfarin dose, chronic edema- stable, wears compression hose  Social history: reports 2 glass of wine/night, minimal exercise, visits wife from lunch to 6 PM   Allergies  Allergen Reactions   Iodine Hives    Contrast dye/ betadine ok   Medications: Outpatient Encounter Medications as of 11/21/2019  Medication Sig   acetaminophen (TYLENOL) 325 MG tablet Take 325 mg by mouth every 6 (six) hours as needed for pain.    ALLERGY RELIEF 180 MG tablet TAKE ONE TABLET DAILY FOR CONGESTION.   ALPRAZolam (XANAX XR) 0.5 MG 24 hr tablet Take 1 tablet (0.5 mg total) by mouth daily.   AMBULATORY NON FORMULARY MEDICATION Medication Name:     nitroglycerin 0.125% . You should apply a pea size amount to your rectum every 8 hours  [facility] information is below: Address: [address], [place], [place] [address]  [phone]  *Please DO NOT go directly from our office to pick up this medication! Give the pharmacy 1 day to process the prescription as this is compounded at takes time to make.   atorvastatin (LIPITOR) 40 MG tablet Take 1 tablet (40 mg total) by mouth daily at 6 PM.   buPROPion (WELLBUTRIN XL) 150 MG 24 hr tablet Take 1 tablet (150 mg total) by mouth daily.    cloNIDine (CATAPRES) 0.1 MG tablet Take 1 tablet (0.1 mg total) by mouth 2 (two) times daily.   ezetimibe (ZETIA) 10 MG tablet Take 1 tablet (10 mg total) by mouth daily.   meclizine (ANTIVERT) 25 MG tablet Take 25 mg by mouth 3 (three) times daily as needed for dizziness. Reported on 07/01/2015   metoprolol tartrate (LOPRESSOR) 25 MG tablet Take 1 tablet (25 mg total) by mouth 2 (two) times daily.   Multiple Vitamin (MULTIVITAMIN PO) Take 1 tablet by mouth daily. pm   omeprazole (PRILOSEC) 40 MG capsule Take 1 capsule (40 mg total) by mouth daily.   polyethylene glycol (MIRALAX / GLYCOLAX) packet Take 17 g by mouth daily.   warfarin (COUMADIN) 5 MG tablet Take up to 1.5 tablets per day by mouth as directed by the coumadin clinic.   No facility-administered encounter medications on file as of 11/21/2019.   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    Difficulty of Paying Living Expenses: Not very hard   Goals Addressed            This Visit's Progress    Pharmacy Care Plan       CARE PLAN ENTRY  Current Barriers:   Chronic Disease Management support, education, and care coordination needs related to Hypertension and Hyperlipidemia   Hypertension  Pharmacist Clinical Goal(s): o Over the next 6 months, patient will work with PharmD and providers to maintain BP goal <140/90  Current regimen:  o Clonidine  0.1 mg - 1 tablet twice daily o Metoprolol tartrate 25 mg - 1 tablet twice daily  Interventions: o Reviewed medication history, per cardiology patient previously on losartan which was discontinued due to decreased renal function  Patient self care activities - Over the next 6 month, patient will: o Check BP with symptoms, document, and provide at future appointments o Ensure daily salt intake < 2300 mg/day o Maintain weight loss with heart healthy diet o Work towards exercise goal of 30 minutes, 5 days per week  Hyperlipidemia  Pharmacist  Clinical Goal(s): o Over the next 6 months, patient will work with PharmD and providers to maintain LDL goal < 100  Current regimen:  o Atorvastatin 40 mg - 1 tablet daily o Zetia 10 mg - 1 tablet daily  Interventions: o Continue current medications  Patient self care activities - over the next 6 months, patient will: o Continue to eat diet high in vegetables, fruits and whole grains and incorporate low-fat dairy products, poultry, fish, legumes, non-tropical vegetable oils and nuts. Limit intake of sweets, sugar-sweetened beverages and red meats.  Please see past updates related to this goal by clicking on the "Past Updates" button in the selected goal       Hypertension   CMP Latest Ref Rng & Units 10/18/2018 09/16/2018 06/22/2017  Glucose 70 - 99 mg/dL 97 117(H) 106(H)  BUN 6 - 23 mg/dL 25(H) 16 20  Creatinine 0.40 - 1.50 mg/dL 1.03 1.04 1.08  Sodium 135 - 145 mEq/L 142 139 140  Potassium 3.5 - 5.1 mEq/L 4.9 4.1 4.6  Chloride 96 - 112 mEq/L 105 104 105  CO2 19 - 32 mEq/L 30 23 29   Calcium 8.4 - 10.5 mg/dL 9.5 9.2 9.3  Total Protein 6.0 - 8.3 g/dL 6.7 7.1 6.4  Total Bilirubin 0.2 - 1.2 mg/dL 1.3(H) 1.4(H) 1.2  Alkaline Phos 39 - 117 U/L 76 68 73  AST 0 - 37 U/L 25 23 31   ALT 0 - 53 U/L 38 27 41   Office blood pressures are: BP Readings from Last 3 Encounters:  04/16/19 (!) 146/92  10/18/18 122/64  09/16/18 (!) 152/105  No UACR in chart; GFR 09/16/18 > 60  BP < 140/90 mmHg Reports frequently elevated in office, rechecks and comes back down  Patient checks BP at home infrequently Patient home BP readings are ranging: none reported Patient has tried these meds in the past: losartan Patient is currently controlled on the following medications:   Clonidine 0.1 mg - 1 tablet BID   Metoprolol tartrate 25 mg - 1 tablet BID  We discussed: reports has maintained weight loss - current weight ~215 lbs  Pertinent history: per cardio note 2017 - pt was on losartan and metoprolol,  clonidine added, avoid CCB due to LLE, avoid diuretics due to renal impairment; 2018 - losartan was discontinued due to decreased GFR, continue metoprolol and clonidine  Update 11/21/19: refills timely, reports BP has been doing good but has not checked recently, last checked in October 156/91, 142/89, 117/82, 120/84, 117/70 (depending on activity level). Denies any s/s of hypotension. Fell 7 weeks ago - tripped over seat in shower and landed in tub --> took 6 Tylenol per day, feeling better around 5 weeks later. Denies any dizziness or symptoms leading to mechanical fall.   Plan: Continue current medications  Hyperlipidemia   Lipid Panel     Component Value Date/Time   CHOL 100 10/18/2018 1307   CHOL 162 01/01/2014 0852  TRIG 79.0 10/18/2018 1307   HDL 35.60 (L) 10/18/2018 1307   HDL 49 01/01/2014 0852   CHOLHDL 3 10/18/2018 1307   VLDL 15.8 10/18/2018 1307   LDLCALC 49 10/18/2018 1307   LDLCALC 91 01/01/2014 0852   LDLDIRECT 99.5 06/24/2013 1530   LABVLDL 22 01/01/2014 0852    The ASCVD Risk score (Goff DC Jr., et al., 2013) failed to calculate for the following reasons:   The valid total cholesterol range is 130 to 320 mg/dL   LDL goal < 100 Patient has failed these meds in past: none reported Patient is currently controlled on the following medications:   Atorvastatin 40 mg - 1 tablet daily  Zetia 10 mg - 1 tablet daily  Update 11/21/19: < 5 day gap between refills in the past 6 months, continues meds as prescribed, no concerns   Plan: Continue current medications  AFIB   Patient is currently rate controlled. Patient has failed these meds in past: none reported Patient is currently controlled on the following medications:   Warfarin 5 mg - 1 tablet daily (confirmed by patient)  Metoprolol tartrate 25 mg - 1 tablet BID  We discussed: INR stable the past 6 months; denies symptoms of AFIB  Update 11/21/19: Last fill warfarin 07/22/19, inquired about adherence --> Pt  reports previously on 1 and 1/2 tablets daily and has excess supply due to auto refill. Confirmed current dose of 5 mg daily, denies missed doses.   Plan: Continue current medications  Depression   Patient has failed these meds in past: none reported Patient is currently controlled on the following medications:   Xanax 0.5 mg XR - 1 tablet daily (takes every morning)  Bupropion 150 mg - 1 tablet daily  Update 11/21/19: Reports refilling Xanax at the beginning of last week, pays cash because insurance doesn't cover it. No cost concerns. Reports he started medications above when his parents health started declining around 2012 but mood has been down recently with wife's health. Reports the medications are still helping and declines desire for changes. Continues to take both medications once daily.  Denies side effects.  Plan: Continue current medications  Medication Management  Misc: Omeprazole 40 mg - 1 capsule daily (working well), meclizine 25 mg - TID PRN dizziness, nitroglycerin ointment per rectum   OTCs: Multivitamin, Miralax, Tylenol 325 mg, Allergy relief 180 mg daily  Pharmacy/Benefits: [facility]  Adherence: uses pillbox   Affordability: denies concerns   CCM Follow Up: 6 months (telephone) 05/21/19 at 9:45 AM  Debbora Dus, PharmD Clinical Pharmacist [facility] at [facility] [phone]

## 2019-11-21 NOTE — Progress Notes (Signed)
I have collaborated with the care management provider regarding care management and care coordination activities outlined in this encounter and have reviewed this encounter including documentation in the note and care plan. I am certifying that I agree with the content of this note and encounter as supervising physician.  

## 2019-11-21 NOTE — Patient Instructions (Signed)
Dear Anthony Kim.,  Below is a summary of the goals we discussed during our follow up appointment on November 21, 2019. Please contact me anytime with questions or concerns.   Visit Information  Goals Addressed            This Visit's Progress   . Pharmacy Care Plan       CARE PLAN ENTRY  Current Barriers:  . Chronic Disease Management support, education, and care coordination needs related to Hypertension and Hyperlipidemia   Hypertension . Pharmacist Clinical Goal(s): o Over the next 6 months, patient will work with PharmD and providers to maintain BP goal <140/90 . Current regimen:  o Clonidine 0.1 mg - 1 tablet twice daily o Metoprolol tartrate 25 mg - 1 tablet twice daily . Interventions: o Reviewed medication history, per cardiology patient previously on losartan which was discontinued due to decreased renal function . Patient self care activities - Over the next 6 month, patient will: o Check BP with symptoms, document, and provide at future appointments o Ensure daily salt intake < 2300 mg/day o Maintain weight loss with heart healthy diet o Work towards exercise goal of 30 minutes, 5 days per week  Hyperlipidemia . Pharmacist Clinical Goal(s): o Over the next 6 months, patient will work with PharmD and providers to maintain LDL goal < 100 . Current regimen:  o Atorvastatin 40 mg - 1 tablet daily o Zetia 10 mg - 1 tablet daily . Interventions: o Continue current medications . Patient self care activities - over the next 6 months, patient will: o Continue to eat diet high in vegetables, fruits and whole grains and incorporate low-fat dairy products, poultry, fish, legumes, non-tropical vegetable oils and nuts. Limit intake of sweets, sugar-sweetened beverages and red meats.  Please see past updates related to this goal by clicking on the "Past Updates" button in the selected goal       Patient verbalizes understanding of instructions provided today.    Telephone follow up appointment with pharmacy team member scheduled for: 05/21/19 at 9:45 AM   Debbora Dus, PharmD Clinical Pharmacist [facility] at [facility] [phone]

## 2019-12-09 ENCOUNTER — Other Ambulatory Visit: Payer: Self-pay | Admitting: Family Medicine

## 2019-12-09 NOTE — Telephone Encounter (Signed)
ERx 

## 2019-12-18 ENCOUNTER — Other Ambulatory Visit: Payer: Self-pay

## 2019-12-18 ENCOUNTER — Ambulatory Visit (INDEPENDENT_AMBULATORY_CARE_PROVIDER_SITE_OTHER): Payer: Medicare Other

## 2019-12-18 DIAGNOSIS — Z86718 Personal history of other venous thrombosis and embolism: Secondary | ICD-10-CM | POA: Diagnosis not present

## 2019-12-18 DIAGNOSIS — Z5181 Encounter for therapeutic drug level monitoring: Secondary | ICD-10-CM | POA: Diagnosis not present

## 2019-12-18 LAB — POCT INR: INR: 1.8 — AB (ref 2.0–3.0)

## 2019-12-18 NOTE — Patient Instructions (Signed)
-   take extra 1/2 tablet tonight, then  - continue dosage of 1 tablet warfarin daily - recheck in 5 weeks.

## 2019-12-23 ENCOUNTER — Other Ambulatory Visit: Payer: Self-pay | Admitting: Family Medicine

## 2019-12-24 NOTE — Telephone Encounter (Signed)
Pharmacy requests refill on: Bupropion HCL XL 150 mg   LAST REFILL: 09/25/2019 (Q-90, R-0) LAST OV: 12/20/2018 NEXT OV: Not Scheduled  PHARMACY: [place], [place]

## 2020-01-01 DIAGNOSIS — H903 Sensorineural hearing loss, bilateral: Secondary | ICD-10-CM | POA: Diagnosis not present

## 2020-01-01 DIAGNOSIS — H6123 Impacted cerumen, bilateral: Secondary | ICD-10-CM | POA: Diagnosis not present

## 2020-01-09 ENCOUNTER — Other Ambulatory Visit: Payer: Self-pay | Admitting: Family Medicine

## 2020-01-09 NOTE — Telephone Encounter (Signed)
Name of Medication: Alprazolam Name of Pharmacy: [facility] or Written Date and Quantity: 12/09/19, #30 Last Office Visit and Type: 12/20/18, caregiver role strain Next Office Visit and Type: 03/11/20, AWV prt 2 Last Controlled Substance Agreement Date: 01/24/17 Last UDS: 01/24/17

## 2020-01-13 ENCOUNTER — Other Ambulatory Visit: Payer: Self-pay | Admitting: Cardiovascular Disease

## 2020-01-13 NOTE — Telephone Encounter (Signed)
ERx 

## 2020-01-13 NOTE — Telephone Encounter (Signed)
Rx request sent to pharmacy.  

## 2020-01-22 ENCOUNTER — Ambulatory Visit (INDEPENDENT_AMBULATORY_CARE_PROVIDER_SITE_OTHER): Payer: Medicare Other

## 2020-01-22 ENCOUNTER — Other Ambulatory Visit: Payer: Self-pay

## 2020-01-22 DIAGNOSIS — Z5181 Encounter for therapeutic drug level monitoring: Secondary | ICD-10-CM | POA: Diagnosis not present

## 2020-01-22 DIAGNOSIS — Z86718 Personal history of other venous thrombosis and embolism: Secondary | ICD-10-CM | POA: Diagnosis not present

## 2020-01-22 LAB — POCT INR: INR: 2.1 (ref 2.0–3.0)

## 2020-01-22 NOTE — Patient Instructions (Signed)
-   continue dosage of 1 tablet warfarin daily - recheck in 6 weeks.  

## 2020-02-11 ENCOUNTER — Telehealth: Payer: Self-pay | Admitting: Family Medicine

## 2020-02-11 NOTE — Telephone Encounter (Signed)
LVM for pt to rtn my call to r/s appt with NHA on 03/04/20

## 2020-03-02 ENCOUNTER — Other Ambulatory Visit: Payer: Self-pay | Admitting: Family Medicine

## 2020-03-02 NOTE — Telephone Encounter (Signed)
ERx 

## 2020-03-02 NOTE — Telephone Encounter (Signed)
Pharmacy requests refill on: Alprazolam ER 0.5 mg   LAST REFILL: 01/13/2020 (Q-30, R-0) LAST OV: 01/16/2019 NEXT OV: 03/11/2020 PHARMACY: [facility]

## 2020-03-04 ENCOUNTER — Ambulatory Visit: Payer: Medicare Other

## 2020-03-04 ENCOUNTER — Ambulatory Visit (INDEPENDENT_AMBULATORY_CARE_PROVIDER_SITE_OTHER): Payer: Medicare Other

## 2020-03-04 ENCOUNTER — Other Ambulatory Visit: Payer: Self-pay

## 2020-03-04 DIAGNOSIS — Z5181 Encounter for therapeutic drug level monitoring: Secondary | ICD-10-CM

## 2020-03-04 DIAGNOSIS — Z86718 Personal history of other venous thrombosis and embolism: Secondary | ICD-10-CM

## 2020-03-04 LAB — POCT INR: INR: 1.9 — AB (ref 2.0–3.0)

## 2020-03-04 NOTE — Patient Instructions (Signed)
-   take extra 1/2 tablet tonight, then  - continue dosage of 1 tablet warfarin daily - recheck in 6 weeks.

## 2020-03-05 ENCOUNTER — Ambulatory Visit (INDEPENDENT_AMBULATORY_CARE_PROVIDER_SITE_OTHER): Payer: Medicare Other

## 2020-03-05 VITALS — BP 129/77 | HR 73

## 2020-03-05 DIAGNOSIS — Z0000 Encounter for general adult medical examination without abnormal findings: Secondary | ICD-10-CM

## 2020-03-05 NOTE — Patient Instructions (Signed)
Anthony Kim , Thank you for taking time to come for your Medicare Wellness Visit. I appreciate your ongoing commitment to your health goals. Please review the following plan we discussed and let me know if I can assist you in the future.   Screening recommendations/referrals: Colonoscopy: Up to date, completed 04/14/2011, due 03/2021 Recommended yearly ophthalmology/optometry visit for glaucoma screening and checkup Recommended yearly dental visit for hygiene and checkup  Vaccinations: Influenza vaccine: Up to date, completed 10/30/2019, due 10/2021 Pneumococcal vaccine: Completed series Tdap vaccine: decline-insurance Shingles vaccine: due, check with your insurance regarding coverage if interested    Covid-19: Completed series Please bring card to physical so we can document dates in your chart  Advanced directives: Please bring a copy of your POA (Power of [place]) and/or Living Will to your next appointment.   Conditions/risks identified: hypertension, hypercholesterolemia   Next appointment: Follow up in one year for your annual wellness visit.   Preventive Care 76 Years and Older, Male Preventive care refers to lifestyle choices and visits with your health care provider that can promote health and wellness. What does preventive care include?  A yearly physical exam. This is also called an annual well check.  Dental exams once or twice a year.  Routine eye exams. Ask your health care provider how often you should have your eyes checked.  Personal lifestyle choices, including:  Daily care of your teeth and gums.  Regular physical activity.  Eating a healthy diet.  Avoiding tobacco and drug use.  Limiting alcohol use.  Practicing safe sex.  Taking low doses of aspirin every day.  Taking vitamin and mineral supplements as recommended by your health care provider. What happens during an annual well check? The services and screenings done by your health care provider  during your annual well check will depend on your age, overall health, lifestyle risk factors, and family history of disease. Counseling  Your health care provider may ask you questions about your:  Alcohol use.  Tobacco use.  Drug use.  Emotional well-being.  Home and relationship well-being.  Sexual activity.  Eating habits.  History of falls.  Memory and ability to understand (cognition).  Work and work Statistician. Screening  You may have the following tests or measurements:  Height, weight, and BMI.  Blood pressure.  Lipid and cholesterol levels. These may be checked every 5 years, or more frequently if you are over 72 years old.  Skin check.  Lung cancer screening. You may have this screening every year starting at age 22 if you have a 30-pack-year history of smoking and currently smoke or have quit within the past 15 years.  Fecal occult blood test (FOBT) of the stool. You may have this test every year starting at age 56.  Flexible sigmoidoscopy or colonoscopy. You may have a sigmoidoscopy every 5 years or a colonoscopy every 10 years starting at age 80.  Prostate cancer screening. Recommendations will vary depending on your family history and other risks.  Hepatitis C blood test.  Hepatitis B blood test.  Sexually transmitted disease (STD) testing.  Diabetes screening. This is done by checking your blood sugar (glucose) after you have not eaten for a while (fasting). You may have this done every 1-3 years.  Abdominal aortic aneurysm (AAA) screening. You may need this if you are a current or former smoker.  Osteoporosis. You may be screened starting at age 93 if you are at high risk. Talk with your health care provider about your test  results, treatment options, and if necessary, the need for more tests. Vaccines  Your health care provider may recommend certain vaccines, such as:  Influenza vaccine. This is recommended every year.  Tetanus,  diphtheria, and acellular pertussis (Tdap, Td) vaccine. You may need a Td booster every 10 years.  Zoster vaccine. You may need this after age 17.  Pneumococcal 13-valent conjugate (PCV13) vaccine. One dose is recommended after age 53.  Pneumococcal polysaccharide (PPSV23) vaccine. One dose is recommended after age 56. Talk to your health care provider about which screenings and vaccines you need and how often you need them. This information is not intended to replace advice given to you by your health care provider. Make sure you discuss any questions you have with your health care provider. Document Released: 01/30/2015 Document Revised: 09/23/2015 Document Reviewed: 11/04/2014 Elsevier Interactive Patient Education  2017 [place] Prevention in the Home Falls can cause injuries. They can happen to people of all ages. There are many things you can do to make your home safe and to help prevent falls. What can I do on the outside of my home?  Regularly fix the edges of walkways and driveways and fix any cracks.  Remove anything that might make you trip as you walk through a door, such as a raised step or threshold.  Trim any bushes or trees on the path to your home.  Use bright outdoor lighting.  Clear any walking paths of anything that might make someone trip, such as rocks or tools.  Regularly check to see if handrails are loose or broken. Make sure that both sides of any steps have handrails.  Any raised decks and porches should have guardrails on the edges.  Have any leaves, snow, or ice cleared regularly.  Use sand or salt on walking paths during winter.  Clean up any spills in your garage right away. This includes oil or grease spills. What can I do in the bathroom?  Use night lights.  Install grab bars by the toilet and in the tub and shower. Do not use towel bars as grab bars.  Use non-skid mats or decals in the tub or shower.  If you need to sit down in  the shower, use a plastic, non-slip stool.  Keep the floor dry. Clean up any water that spills on the floor as soon as it happens.  Remove soap buildup in the tub or shower regularly.  Attach bath mats securely with double-sided non-slip rug tape.  Do not have throw rugs and other things on the floor that can make you trip. What can I do in the bedroom?  Use night lights.  Make sure that you have a light by your bed that is easy to reach.  Do not use any sheets or blankets that are too big for your bed. They should not hang down onto the floor.  Have a firm chair that has side arms. You can use this for support while you get dressed.  Do not have throw rugs and other things on the floor that can make you trip. What can I do in the kitchen?  Clean up any spills right away.  Avoid walking on wet floors.  Keep items that you use a lot in easy-to-reach places.  If you need to reach something above you, use a strong step stool that has a grab bar.  Keep electrical cords out of the way.  Do not use floor polish or wax that makes  floors slippery. If you must use wax, use non-skid floor wax.  Do not have throw rugs and other things on the floor that can make you trip. What can I do with my stairs?  Do not leave any items on the stairs.  Make sure that there are handrails on both sides of the stairs and use them. Fix handrails that are broken or loose. Make sure that handrails are as long as the stairways.  Check any carpeting to make sure that it is firmly attached to the stairs. Fix any carpet that is loose or worn.  Avoid having throw rugs at the top or bottom of the stairs. If you do have throw rugs, attach them to the floor with carpet tape.  Make sure that you have a light switch at the top of the stairs and the bottom of the stairs. If you do not have them, ask someone to add them for you. What else can I do to help prevent falls?  Wear shoes that:  Do not have high  heels.  Have rubber bottoms.  Are comfortable and fit you well.  Are closed at the toe. Do not wear sandals.  If you use a stepladder:  Make sure that it is fully opened. Do not climb a closed stepladder.  Make sure that both sides of the stepladder are locked into place.  Ask someone to hold it for you, if possible.  Clearly mark and make sure that you can see:  Any grab bars or handrails.  First and last steps.  Where the edge of each step is.  Use tools that help you move around (mobility aids) if they are needed. These include:  Canes.  Walkers.  Scooters.  Crutches.  Turn on the lights when you go into a dark area. Replace any light bulbs as soon as they burn out.  Set up your furniture so you have a clear path. Avoid moving your furniture around.  If any of your floors are uneven, fix them.  If there are any pets around you, be aware of where they are.  Review your medicines with your doctor. Some medicines can make you feel dizzy. This can increase your chance of falling. Ask your doctor what other things that you can do to help prevent falls. This information is not intended to replace advice given to you by your health care provider. Make sure you discuss any questions you have with your health care provider. Document Released: 10/30/2008 Document Revised: 06/11/2015 Document Reviewed: 02/07/2014 Elsevier Interactive Patient Education  2017 [employer].

## 2020-03-05 NOTE — Progress Notes (Addendum)
Subjective:   Anthony Kim. is a 75 y.o. male who presents for Medicare Annual/Subsequent preventive examination.  Review of Systems: N/A      I connected with the patient today by telephone and verified that I am speaking with the correct person using two identifiers. Location patient: home Location nurse: work Persons participating in the telephone visit: patient, nurse.   I discussed the limitations, risks, security and privacy concerns of performing an evaluation and management service by telephone and the availability of in person appointments. I also discussed with the patient that there may be a patient responsible charge related to this service. The patient expressed understanding and verbally consented to this telephonic visit.        Cardiac Risk Factors include: advanced age (>29men, >36 women);male gender;hypertension;Other (see comment), Risk factor comments: hypercholesterolemia     Objective:    Today's Vitals   03/05/20 1140  BP: 129/77  Pulse: 73   There is no height or weight on file to calculate BMI.  Advanced Directives 03/05/2020 09/16/2018 07/03/2018 06/22/2017 08/03/2016 09/29/2015 08/05/2015  Does Patient Have a Medical Advance Directive? Yes No Yes Yes No Yes Yes  Type of Paramedic of [place];Living will - [place];Living will [place];Living will - Healthcare Power of [place];Living will  Does patient want to make changes to medical advance directive? - - No - Patient declined - - No - Patient declined -  Copy of [place] in Chart? No - copy requested - Yes - validated most recent copy scanned in chart (See row information) No - copy requested - No - copy requested Yes  Would patient like information on creating a medical advance directive? - No - Patient declined - - - - -    Current Medications (verified) Outpatient Encounter Medications  as of 03/05/2020  Medication Sig  . acetaminophen (TYLENOL) 325 MG tablet Take 325 mg by mouth every 6 (six) hours as needed for pain.  Marland Kitchen ALLERGY RELIEF 180 MG tablet TAKE ONE TABLET DAILY FOR CONGESTION.  Marland Kitchen ALPRAZolam (XANAX XR) 0.5 MG 24 hr tablet Take 1 tablet (0.5 mg total) by mouth daily.  . AMBULATORY NON FORMULARY MEDICATION Medication Name:     nitroglycerin 0.125% . You should apply a pea size amount to your rectum every 8 hours  [facility] information is below: Address: [address], [place], [place] [address]  [phone]  *Please DO NOT go directly from our office to pick up this medication! Give the pharmacy 1 day to process the prescription as this is compounded at takes time to make.  Marland Kitchen atorvastatin (LIPITOR) 40 MG tablet Take 1 tablet (40 mg total) by mouth daily at 6 PM.  . buPROPion (WELLBUTRIN XL) 150 MG 24 hr tablet Take 1 tablet (150 mg total) by mouth daily.  . cloNIDine (CATAPRES) 0.1 MG tablet Take 1 tablet (0.1 mg total) by mouth 2 (two) times daily.  Marland Kitchen ezetimibe (ZETIA) 10 MG tablet Take 1 tablet (10 mg total) by mouth daily.  . meclizine (ANTIVERT) 25 MG tablet Take 25 mg by mouth 3 (three) times daily as needed for dizziness. Reported on 07/01/2015  . metoprolol tartrate (LOPRESSOR) 25 MG tablet Take 1 tablet (25 mg total) by mouth 2 (two) times daily.  . Multiple Vitamin (MULTIVITAMIN PO) Take 1 tablet by mouth daily. pm  . omeprazole (PRILOSEC) 40 MG capsule Take 1 capsule (40  mg total) by mouth daily.  . polyethylene glycol (MIRALAX / GLYCOLAX) packet Take 17 g by mouth daily.  Marland Kitchen warfarin (COUMADIN) 5 MG tablet Take up to 1.5 tablets per day by mouth as directed by the coumadin clinic.   No facility-administered encounter medications on file as of 03/05/2020.    Allergies (verified) Iodine   History: Past Medical History:  Diagnosis Date  . Anxiety   . CAD (coronary artery disease)    a. 07/2012 CTA chest - incidental coronary Ca2+  of LAD/LCX; b. 08/2012 Myoview: EF 59%, non ischemia/scar-->Low risk.  . Clotting disorder ([place])   . Colon polyps    2008 Tubular Adenoma  . Cough    chronic  . Deep vein thrombosis (DVT) ([place]) 04/06/2010  . Depression   . DVT (deep venous thrombosis) ([place])    a. RLE DVT ~ 05/2010-->chronic coumadin.  Marland Kitchen GERD (gastroesophageal reflux disease)   . Hyperlipidemia   . Hypertension    controlled on meds  . IBS (irritable bowel syndrome)   . NICM (nonischemic cardiomyopathy) ([place])    a. 06/2016 Echo: EF 45-50%, diff HK, mod dil LA/RA.  Marland Kitchen Palpitations   . Persistent atrial fibrillation ([place])    a. Dx 06/2010 - CHA2DS2VASc = 3-4 (Chronic coumadin); b. 07/2016 s/p DCCV (150J) - initially successful but reverted to Afib.  . Pneumonia    3 wks ago/ no hospitalization/ no follow up required/ was on antibiotic and steroids/Dr Edilia Bo  . Presence of partial dental prosthetic device    lower right  . Seasonal allergies    Past Surgical History:  Procedure Laterality Date  . APPENDECTOMY     1967  . CARDIOVERSION N/A 08/03/2016   Procedure: CARDIOVERSION;  Surgeon: Minna Merritts, MD;  Location: [facility] ORS;  Service: Cardiovascular;  Laterality: N/A;  . CATARACT EXTRACTION W/PHACO Right 07/08/2015   Procedure: CATARACT EXTRACTION PHACO AND INTRAOCULAR LENS PLACEMENT ([place]) RIGHT EYE;  Surgeon: Leandrew Koyanagi, MD;  Location: [facility];  Service: Ophthalmology;  Laterality: Right;   SYMFONY LENS  . CATARACT EXTRACTION W/PHACO Left 08/05/2015   Procedure: CATARACT EXTRACTION PHACO AND INTRAOCULAR LENS PLACEMENT ([place]) left eye;  Surgeon: Leandrew Koyanagi, MD;  Location: [place];  Service: Ophthalmology;  Laterality: Left;  SYMFONY LENS  . CATARACT EXTRACTION, BILATERAL Bilateral 08/2015  . COLONOSCOPY     x3  . POLYPECTOMY    . TONSILLECTOMY AND ADENOIDECTOMY     Family History  Problem Relation Age of Onset  . Heart disease Mother   . Hypertension Mother   . Irritable  bowel syndrome Mother   . Heart attack Father   . Hypertension Father   . Prostate cancer Father    Social History   Socioeconomic History  . Marital status: Married    Spouse name: Not on file  . Number of children: 1  . Years of education: Not on file  . Highest education level: Not on file  Occupational History  . Occupation: Scientist, clinical (histocompatibility and immunogenetics): [employer]  Tobacco Use  . Smoking status: Former Smoker    Packs/day: 1.00    Years: 30.00    Pack years: 30.00    Types: Cigarettes    Quit date: 01/18/1992    Years since quitting: 28.1  . Smokeless tobacco: Never Used  Vaping Use  . Vaping Use: Never used  Substance and Sexual Activity  . Alcohol use: Yes    Alcohol/week: 7.0 - 14.0 standard drinks    Types:  7 - 14 Glasses of wine per week    Comment: 1-2 glasses of wine at night   . Drug use: No  . Sexual activity: Yes  Other Topics Concern  . Not on file  Social History Narrative   Has living will.   Desires CPR.   Would not want life support if recovery futile.  Unsure about feeding tube.   Lives with wife [name]   Activity: active on cattle farm   Diet: good water, G2 gatorade, fruits/vegetables daily    Social Determinants of Health   Financial Resource Strain: Low Risk   . Difficulty of Paying Living Expenses: Not hard at all  Food Insecurity: No Food Insecurity  . Worried About Charity fundraiser in the Last Year: Never true  . Ran Out of Food in the Last Year: Never true  Transportation Needs: No Transportation Needs  . Lack of Transportation (Medical): No  . Lack of Transportation (Non-Medical): No  Physical Activity: Inactive  . Days of Exercise per Week: 0 days  . Minutes of Exercise per Session: 0 min  Stress: No Stress Concern Present  . Feeling of Stress : Not at all  Social Connections: Not on file    Tobacco Counseling Counseling given: Not Answered   Clinical Intake:  Pre-visit preparation completed: Yes  Pain : No/denies  pain     Nutritional Risks: None Diabetes: No  How often do you need to have someone help you when you read instructions, pamphlets, or other written materials from your doctor or pharmacy?: 1 - Never  Diabetic: No Nutrition Risk Assessment:  Has the patient had any N/V/D within the last 2 months?  No  Does the patient have any non-healing wounds?  No  Has the patient had any unintentional weight loss or weight gain?  No   Diabetes:  Is the patient diabetic?  No  If diabetic, was a CBG obtained today?  N/A Did the patient bring in their glucometer from home?  N/A How often do you monitor your CBG's? N/A.   Financial Strains and Diabetes Management:  Are you having any financial strains with the device, your supplies or your medication? N/A.  Does the patient want to be seen by Chronic Care Management for management of their diabetes?  N/A Would the patient like to be referred to a Nutritionist or for Diabetic Management?  N/A   Interpreter Needed?: No  Information entered by :: CJohnson, LPN   Activities of Daily Living In your present state of health, do you have any difficulty performing the following activities: 03/05/2020  Hearing? N  Vision? N  Difficulty concentrating or making decisions? N  Walking or climbing stairs? N  Dressing or bathing? N  Doing errands, shopping? N  Preparing Food and eating ? N  Using the Toilet? N  In the past six months, have you accidently leaked urine? N  Do you have problems with loss of bowel control? N  Managing your Medications? N  Managing your Finances? N  Housekeeping or managing your Housekeeping? N  Some recent data might be hidden    Patient Care Team: Ria Bush, MD as PCP - General (Family Medicine) Rockey Situ Kathlene November, MD as PCP - Cardiology (Cardiology) Rockey Situ Kathlene November, MD as Consulting Physician (Cardiology) Inda Castle, MD (Inactive) as Consulting Physician (Gastroenterology) McDiarmid, Blane Ohara, MD  as Consulting Physician (Family Medicine) Bjorn Loser, MD as Consulting Physician (Urology) Philis Kendall, MD as Consulting Physician (Ophthalmology) Aluisio,  Pilar Plate, MD as Consulting Physician (Orthopedic Surgery) Leandrew Koyanagi, MD as Referring Physician (Ophthalmology) Debbora Dus, [facility] as Pharmacist (Pharmacist)  Indicate any recent Medical Services you may have received from other than [facility] providers in the past year (date may be approximate).     Assessment:   This is a routine wellness examination for Bryant.  Hearing/Vision screen  Hearing Screening   125Hz  250Hz  500Hz  1000Hz  2000Hz  3000Hz  4000Hz  6000Hz  8000Hz   Right ear:           Left ear:           Vision Screening Comments: Patient gets annual eye exams   Dietary issues and exercise activities discussed: Current Exercise Habits: The patient does not participate in regular exercise at present, Exercise limited by: None identified  Goals    . Patient Stated     Starting 07/04/2018, I will continue to take medications as prescribed.     . 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     Marland Kitchen Pharmacy Care Plan     CARE PLAN ENTRY  Current Barriers:  . Chronic Disease Management support, education, and care coordination needs related to Hypertension and Hyperlipidemia   Hypertension . Pharmacist Clinical Goal(s): o Over the next 6 months, patient will work with PharmD and providers to maintain BP goal <140/90 . Current regimen:  o Clonidine 0.1 mg - 1 tablet twice daily o Metoprolol tartrate 25 mg - 1 tablet twice daily . Interventions: o Reviewed medication history, per cardiology patient previously on losartan which was discontinued due to decreased renal function . Patient self care activities - Over the next 6 month, patient will: o Check BP with symptoms, document, and provide at future appointments o Ensure daily salt intake < 2300 mg/day o Maintain  weight loss with heart healthy diet o Work towards exercise goal of 30 minutes, 5 days per week  Hyperlipidemia . Pharmacist Clinical Goal(s): o Over the next 6 months, patient will work with PharmD and providers to maintain LDL goal < 100 . Current regimen:  o Atorvastatin 40 mg - 1 tablet daily o Zetia 10 mg - 1 tablet daily . Interventions: o Continue current medications . Patient self care activities - over the next 6 months, patient will: o Continue to eat diet high in vegetables, fruits and whole grains and incorporate low-fat dairy products, poultry, fish, legumes, non-tropical vegetable oils and nuts. Limit intake of sweets, sugar-sweetened beverages and red meats.  Please see past updates related to this goal by clicking on the "Past Updates" button in the selected goal       Depression Screen PHQ 2/9 Scores 03/05/2020 07/03/2018 06/22/2017 09/29/2015 06/23/2014 03/22/2012  PHQ - 2 Score 2 0 0 0 0 0  PHQ- 9 Score 2 0 0 - - -    Fall Risk Fall Risk  03/05/2020 07/03/2018 06/22/2017 06/23/2014 03/22/2012  Falls in the past year? 0 0 No No No  Number falls in past yr: 0 - - - -  Injury with Fall? 0 - - - -  Risk for fall due to : Medication side effect - - - -  Follow up Falls evaluation completed;Falls prevention discussed - - - -    FALL RISK PREVENTION PERTAINING TO THE HOME:  Any stairs in or around the home? Yes  If so, are there any without handrails? No  Home free of loose throw rugs in walkways, pet beds,  electrical cords, etc? Yes  Adequate lighting in your home to reduce risk of falls? Yes   ASSISTIVE DEVICES UTILIZED TO PREVENT FALLS:  Life alert? No  Use of a cane, walker or w/c? No  Grab bars in the bathroom? No  Shower chair or bench in shower? No  Elevated toilet seat or a handicapped toilet? No   TIMED UP AND GO:  Was the test performed? N/A telephone visit .  Cognitive Function: MMSE - Mini Mental State Exam 03/05/2020 07/03/2018 06/22/2017 09/29/2015  Orientation  to time 5 5 5 5   Orientation to Place 5 5 5 5   Registration 3 3 3 3   Attention/ Calculation 5 0 0 0  Recall 3 3 3 3   Language- name 2 objects - 0 0 0  Language- repeat 1 1 1 1   Language- follow 3 step command - 0 3 3  Language- read & follow direction - 0 0 0  Write a sentence - 0 0 0  Copy design - 0 0 0  Total score - 17 20 20   Mini Cog  Mini-Cog screen was completed. Maximum score is 22. A value of 0 denotes this part of the MMSE was not completed or the patient failed this part of the Mini-Cog screening.       Immunizations Immunization History  Administered Date(s) Administered  . Fluad Quad(high Dose 65+) 10/18/2018, 10/30/2019  . Influenza Split 10/31/2011  . Influenza Whole 10/30/2007, 10/28/2008, 10/27/2009  . Influenza,inj,Quad PF,6+ Mos 11/13/2013, 10/14/2014, 10/01/2015, 01/12/2017, 11/09/2017  . Pneumococcal Conjugate-13 11/13/2013  . Pneumococcal Polysaccharide-23 03/23/2011  . Td 08/03/2000, 09/16/2008  . Zoster 10/28/2008    TDAP status: Due, Education has been provided regarding the importance of this vaccine. Advised may receive this vaccine at local pharmacy or Health Dept. Aware to provide a copy of the vaccination record if obtained from local pharmacy or Health Dept. Verbalized acceptance and understanding.  Flu Vaccine status: Up to date  Pneumococcal vaccine status: Up to date  Covid-19 vaccine status: Completed vaccines, Patient will bring his card to physical so dates can be documented in his chart  Qualifies for Shingles Vaccine? Yes   Zostavax completed Yes   Shingrix Completed?: No.    Education has been provided regarding the importance of this vaccine. Patient has been advised to call insurance company to determine out of pocket expense if they have not yet received this vaccine. Advised may also receive vaccine at local pharmacy or Health Dept. Verbalized acceptance and understanding.  Screening Tests Health Maintenance  Topic Date Due  .  COVID-19 Vaccine (1) Never done  . TETANUS/TDAP  03/06/2023 (Originally 09/17/2018)  . COLONOSCOPY (Pts 45-90yrs Insurance coverage will need to be confirmed)  04/13/2021  . INFLUENZA VACCINE  Completed  . Hepatitis C Screening  Completed  . PNA vac Low Risk Adult  Completed    Health Maintenance  Health Maintenance Due  Topic Date Due  . COVID-19 Vaccine (1) Never done    Colorectal cancer screening: Type of screening: Colonoscopy. Completed 04/14/2011. Repeat every 10 years  Lung Cancer Screening: (Low Dose CT Chest recommended if Age 7-80 years, 30 pack-year currently smoking OR have quit w/in 15 years.) does not qualify.    Additional Screening:  Hepatitis C Screening: does qualify; Completed 06/22/2017  Vision Screening: Recommended annual ophthalmology exams for early detection of glaucoma and other disorders of the eye. Is the patient up to date with their annual eye exam?  Yes  Who is the provider  or what is the name of the office in which the patient attends annual eye exams? Dr. Wallace Going, [facility]  If pt is not established with a provider, would they like to be referred to a provider to establish care? No .   Dental Screening: Recommended annual dental exams for proper oral hygiene  Community Resource Referral / Chronic Care Management: CRR required this visit?  No   CCM required this visit?  No      Plan:     I have personally reviewed and noted the following in the patient's chart:   . Medical and social history . Use of alcohol, tobacco or illicit drugs  . Current medications and supplements . Functional ability and status . Nutritional status . Physical activity . Advanced directives . List of other physicians . Hospitalizations, surgeries, and ER visits in previous 12 months . Vitals . Screenings to include cognitive, depression, and falls . Referrals and appointments  In addition, I have reviewed and discussed with patient certain  preventive protocols, quality metrics, and best practice recommendations. A written personalized care plan for preventive services as well as general preventive health recommendations were provided to patient.   Due to this being a telephonic visit, the after visit summary with patients personalized plan was offered to patient via office or my-chart. Patient preferred to pick up at office at next visit or via mychart.   Andrez Grime, LPN   09/20/8331

## 2020-03-05 NOTE — Progress Notes (Signed)
PCP notes:  Health Maintenance: Tdap- insurance Covid- completed series, Patient will bring his card to his physical so dates can be documented in chart   Abnormal Screenings: none   Patient concerns: none   Nurse concerns: none   Next PCP appt.: 03/11/2020 @ 8:30 am

## 2020-03-06 ENCOUNTER — Ambulatory Visit: Payer: Medicare Other

## 2020-03-11 ENCOUNTER — Other Ambulatory Visit: Payer: Self-pay

## 2020-03-11 ENCOUNTER — Ambulatory Visit (INDEPENDENT_AMBULATORY_CARE_PROVIDER_SITE_OTHER): Payer: Medicare Other | Admitting: Family Medicine

## 2020-03-11 ENCOUNTER — Encounter: Payer: Self-pay | Admitting: Family Medicine

## 2020-03-11 VITALS — BP 138/90 | HR 81 | Temp 97.6°F | Ht 71.0 in | Wt 220.1 lb

## 2020-03-11 DIAGNOSIS — I714 Abdominal aortic aneurysm, without rupture, unspecified: Secondary | ICD-10-CM

## 2020-03-11 DIAGNOSIS — Z7189 Other specified counseling: Secondary | ICD-10-CM

## 2020-03-11 DIAGNOSIS — Z7901 Long term (current) use of anticoagulants: Secondary | ICD-10-CM

## 2020-03-11 DIAGNOSIS — N289 Disorder of kidney and ureter, unspecified: Secondary | ICD-10-CM | POA: Diagnosis not present

## 2020-03-11 DIAGNOSIS — E669 Obesity, unspecified: Secondary | ICD-10-CM | POA: Diagnosis not present

## 2020-03-11 DIAGNOSIS — F331 Major depressive disorder, recurrent, moderate: Secondary | ICD-10-CM

## 2020-03-11 DIAGNOSIS — I10 Essential (primary) hypertension: Secondary | ICD-10-CM | POA: Diagnosis not present

## 2020-03-11 DIAGNOSIS — I700 Atherosclerosis of aorta: Secondary | ICD-10-CM

## 2020-03-11 DIAGNOSIS — Z125 Encounter for screening for malignant neoplasm of prostate: Secondary | ICD-10-CM

## 2020-03-11 DIAGNOSIS — I4819 Other persistent atrial fibrillation: Secondary | ICD-10-CM

## 2020-03-11 DIAGNOSIS — Z86718 Personal history of other venous thrombosis and embolism: Secondary | ICD-10-CM | POA: Diagnosis not present

## 2020-03-11 DIAGNOSIS — E7800 Pure hypercholesterolemia, unspecified: Secondary | ICD-10-CM

## 2020-03-11 LAB — CBC WITH DIFFERENTIAL/PLATELET
Basophils Absolute: 0 10*3/uL (ref 0.0–0.1)
Basophils Relative: 0.6 % (ref 0.0–3.0)
Eosinophils Absolute: 0.2 10*3/uL (ref 0.0–0.7)
Eosinophils Relative: 3.3 % (ref 0.0–5.0)
HCT: 42.5 % (ref 39.0–52.0)
Hemoglobin: 14.5 g/dL (ref 13.0–17.0)
Lymphocytes Relative: 30.3 % (ref 12.0–46.0)
Lymphs Abs: 1.7 10*3/uL (ref 0.7–4.0)
MCHC: 34.2 g/dL (ref 30.0–36.0)
MCV: 93.5 fl (ref 78.0–100.0)
Monocytes Absolute: 0.4 10*3/uL (ref 0.1–1.0)
Monocytes Relative: 7.6 % (ref 3.0–12.0)
Neutro Abs: 3.3 10*3/uL (ref 1.4–7.7)
Neutrophils Relative %: 58.2 % (ref 43.0–77.0)
Platelets: 129 10*3/uL — ABNORMAL LOW (ref 150.0–400.0)
RBC: 4.55 Mil/uL (ref 4.22–5.81)
RDW: 13.8 % (ref 11.5–15.5)
WBC: 5.7 10*3/uL (ref 4.0–10.5)

## 2020-03-11 LAB — COMPREHENSIVE METABOLIC PANEL
ALT: 25 U/L (ref 0–53)
AST: 22 U/L (ref 0–37)
Albumin: 4 g/dL (ref 3.5–5.2)
Alkaline Phosphatase: 69 U/L (ref 39–117)
BUN: 16 mg/dL (ref 6–23)
CO2: 33 mEq/L — ABNORMAL HIGH (ref 19–32)
Calcium: 9.2 mg/dL (ref 8.4–10.5)
Chloride: 103 mEq/L (ref 96–112)
Creatinine, Ser: 0.98 mg/dL (ref 0.40–1.50)
GFR: 75.33 mL/min (ref >60.00)
Glucose, Bld: 100 mg/dL — ABNORMAL HIGH (ref 70–99)
Potassium: 4.8 mEq/L (ref 3.5–5.1)
Sodium: 141 mEq/L (ref 135–145)
Total Bilirubin: 1.1 mg/dL (ref 0.2–1.2)
Total Protein: 6.6 g/dL (ref 6.0–8.3)

## 2020-03-11 LAB — LIPID PANEL
Cholesterol: 129 mg/dL (ref 0–200)
HDL: 53.5 mg/dL (ref >39.00)
LDL Cholesterol: 58 mg/dL (ref 0–99)
NonHDL: 75.95
Total CHOL/HDL Ratio: 2
Triglycerides: 92 mg/dL (ref 0.0–149.0)
VLDL: 18.4 mg/dL (ref 0.0–40.0)

## 2020-03-11 LAB — TSH: TSH: 2.95 u[IU]/mL (ref 0.35–4.50)

## 2020-03-11 LAB — PSA, MEDICARE: PSA: 0.34 ng/ml (ref 0.10–4.00)

## 2020-03-11 MED ORDER — BUPROPION HCL ER (XL) 150 MG PO TB24: 150.0000 mg | ORAL_TABLET | Freq: Every day | ORAL | 3 refills | Status: DC

## 2020-03-11 NOTE — Assessment & Plan Note (Addendum)
Advanced directive discussion -wife was HCPOA. Has advanced directives at home - asked to bring [place] copy.Desires CPR. Would not want life support if recovery futile. Unsure about feeding tube.

## 2020-03-11 NOTE — Assessment & Plan Note (Signed)
Continue statin, zetia.  

## 2020-03-11 NOTE — Assessment & Plan Note (Signed)
Update Cr.  

## 2020-03-11 NOTE — Progress Notes (Signed)
Patient ID: Anthony Kim., male    DOB: 10-01-1945, 75 y.o.   MRN: [id number]  This visit was conducted in person.  BP 138/90   Pulse 81   Temp 97.6 F (36.4 C) (Temporal)   Ht 5\' 11"  (1.803 m)   Wt 220 lb 1 oz (99.8 kg)   SpO2 98%   BMI 30.69 kg/m    CC: AMW f/u visit  Subjective:   HPI: Anthony Kim. is a 75 y.o. male presenting on 03/11/2020 for Medicare Wellness (Part 2 )   Saw health advisor last week for medicare wellness visit. Note reviewed.    Hearing Screening   125Hz  250Hz  500Hz  1000Hz  2000Hz  3000Hz  4000Hz  6000Hz  8000Hz   Right ear:  20 20 25  40  0    Left ear:  25 25 25  0  0    Vision Screening Comments: Last eye exam in 2021 at [facility] from 03/05/2020 in [place] at [place]  PHQ-2 Total Score 2      Fall Risk  03/05/2020 07/03/2018 06/22/2017 06/23/2014 03/22/2012  Falls in the past year? 0 0 No No No  Number falls in past yr: 0 - - - -  Injury with Fall? 0 - - - -  Risk for fall due to : Medication side effect - - - -  Follow up Falls evaluation completed;Falls prevention discussed - - - -     Wife passed away 03-11-2020. I was not notified. Expressed my condolences.   Afib - stable period on current regimen with coumadin AC.   Preventative: Colonoscopy 2013 WNL, rpt 5 yrs --> changed to 10 yrs [name] will be due 2023 ([name]  Prostate cancer screening - had seen urology with PSA/DRE ([name] Father with h/o prostate cancer. Planning to reschedule appt but requests PSA today Lung cancer screening -not eligible Flu shot -yearly COVID vaccine Moderna 03/22/2019, 03/2019, Moderna booster 11/2019 Td 2010 Pneumovax 2013, prevnar 2015 Zostavax -2010 Shingrix -discussed Advanced directive discussion -wife was HCPOA. Has advanced directives at home - asked to bring [place] copy.Desires CPR. Would not want life support if recovery futile. Unsure about feeding tube. Seat belt use  discussed Sunscreen use discussed. No changing moles on skin.  Ex - smoker - 30+ PY hx, quit cold [place] 1990s Alcohol - 1-2 glasses wine per night Dentist - yearly Eye exam -yearly. Had cataract surgery.  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 Interim medical history since our last visit reviewed. Allergies and medications reviewed and updated. Outpatient Medications Prior to Visit  Medication Sig Dispense Refill  . acetaminophen (TYLENOL) 325 MG tablet Take 325 mg by mouth every 6 (six) hours as needed for pain.    Marland Kitchen ALLERGY RELIEF 180 MG tablet TAKE ONE TABLET DAILY FOR CONGESTION. 30 tablet 5  . ALPRAZolam (XANAX XR) 0.5 MG 24 hr tablet Take 1 tablet (0.5 mg total) by mouth daily. 30 tablet 0  . AMBULATORY NON FORMULARY MEDICATION Medication Name:     nitroglycerin 0.125% . You should apply a pea size amount to your rectum every 8 hours  [facility] information is below: Address:  [address] [place],  [address]  [phone]  *Please DO NOT go directly from our office to pick up this medication! Give the pharmacy 1 day to process the prescription as this is compounded at takes time to make. 30 g 1  . atorvastatin (LIPITOR) 40 MG tablet Take 1 tablet (40 mg total) by mouth daily at 6 PM. 90 tablet 2  . cloNIDine (CATAPRES) 0.1 MG tablet Take 1 tablet (0.1 mg total) by mouth 2 (two) times daily. 60 tablet 7  . ezetimibe (ZETIA) 10 MG tablet Take 1 tablet (10 mg total) by mouth daily. 90 tablet 3  . meclizine (ANTIVERT) 25 MG tablet Take 25 mg by mouth 3 (three) times daily as needed for dizziness. Reported on 07/01/2015    .  metoprolol tartrate (LOPRESSOR) 25 MG tablet Take 1 tablet (25 mg total) by mouth 2 (two) times daily. 180 tablet 3  . Multiple Vitamin (MULTIVITAMIN PO) Take 1 tablet by mouth daily. pm    . omeprazole (PRILOSEC) 40 MG capsule Take 1 capsule (40 mg total) by mouth daily. 30 capsule 7  . polyethylene glycol (MIRALAX / GLYCOLAX) packet Take 17 g by mouth daily.    Marland Kitchen warfarin (COUMADIN) 5 MG tablet Take up to 1.5 tablets per day by mouth as directed by the coumadin clinic. 40 tablet 3  . buPROPion (WELLBUTRIN XL) 150 MG 24 hr tablet Take 1 tablet (150 mg total) by mouth daily. 90 tablet 0   No facility-administered medications prior to visit.     Per HPI unless specifically indicated in ROS section below Review of Systems Objective:  BP 138/90   Pulse 81   Temp 97.6 F (36.4 C) (Temporal)   Ht 5\' 11"  (1.803 m)   Wt 220 lb 1 oz (99.8 kg)   SpO2 98%   BMI 30.69 kg/m   Wt Readings from Last 3 Encounters:  03/11/20 220 lb 1 oz (99.8 kg)  04/16/19 224 lb (101.6 kg)  12/20/18 215 lb (97.5 kg)      Physical Exam Vitals and nursing note reviewed.  Constitutional:      General: He is not in acute distress.    Appearance: Normal appearance. He is well-developed and well-nourished. He is not ill-appearing.  HENT:     Head: Normocephalic and atraumatic.     Right Ear: Hearing, tympanic membrane, ear canal and external ear normal.     Left Ear: Hearing, tympanic membrane, ear canal and external ear normal.     Mouth/Throat:     Mouth: Oropharynx is clear and moist and mucous membranes are normal.     Pharynx: No posterior oropharyngeal edema.  Eyes:     General: No scleral icterus.    Extraocular Movements: Extraocular movements intact and EOM normal.     Conjunctiva/sclera: Conjunctivae normal.     Pupils: Pupils are equal, round, and reactive to light.  Neck:     Thyroid: No thyroid mass or thyromegaly.     Vascular: No carotid bruit.  Cardiovascular:     Rate and Rhythm: Normal  rate and regular rhythm.     Pulses: Normal pulses and intact distal pulses.          Radial pulses are 2+ on the right side and 2+ on the left side.     Heart sounds: Normal heart sounds. No murmur heard.   Pulmonary:     Effort: Pulmonary effort is normal. No respiratory distress.     Breath sounds:  Normal breath sounds. No wheezing, rhonchi or rales.  Abdominal:     General: Abdomen is flat. Bowel sounds are normal. There is no distension.     Palpations: Abdomen is soft. There is no mass.     Tenderness: There is no abdominal tenderness. There is no guarding or rebound.     Hernia: No hernia is present.  Musculoskeletal:        General: No edema. Normal range of motion.     Cervical back: Normal range of motion and neck supple.     Right lower leg: No edema.     Left lower leg: No edema.  Lymphadenopathy:     Cervical: No cervical adenopathy.  Skin:    General: Skin is warm and dry.     Findings: No rash.  Neurological:     General: No focal deficit present.     Mental Status: He is alert and oriented to person, place, and time.     Comments: CN grossly intact, station and gait intact  Psychiatric:        Mood and Affect: Mood and affect and mood normal.        Behavior: Behavior normal.        Thought Content: Thought content normal.        Judgment: Judgment normal.       Results for orders placed or performed in visit on 03/04/20  POCT INR  Result Value Ref Range   INR 1.9 (A) 2.0 - 3.0   Assessment & Plan:  This visit occurred during the SARS-CoV-2 public health emergency.  Safety protocols were in place, including screening questions prior to the visit, additional usage of staff PPE, and extensive cleaning of exam room while observing appropriate contact time as indicated for disinfecting solutions.   Problem List Items Addressed This Visit    Renal insufficiency    Update Cr.       Relevant Orders   Comprehensive metabolic panel   Persistent atrial  fibrillation (HCC)    Check CBC. On coumadin followed by cardiology clinic.       Relevant Orders   CBC with Differential/Platelet   TSH   Obesity, Class I, BMI 30.0-34.9 (see actual BMI)    Overall stable weight. Stressful period with wife's recent passing.       MDD (major depressive disorder), recurrent episode, moderate (HCC)    Chronic, stable period on wellbutrin + xanax XR - continue.  Expressed my condolences with wife's recent passing.       Relevant Medications   buPROPion (WELLBUTRIN XL) 150 MG 24 hr tablet   Long term current use of anticoagulant   HYPERCHOLESTEROLEMIA    Update FLP on zetia, lipitor.  The ASCVD Risk score Mikey Bussing DC Jr., et al., 2013) failed to calculate for the following reasons:   The valid total cholesterol range is 130 to 320 mg/dL       Relevant Orders   Lipid panel   Comprehensive metabolic panel   History of DVT of lower extremity    On lifelong anticoagulation followed by cardiology coumadin clinic.       Essential hypertension    Chronic, stable on current regimen followed by cardiology.       Advanced care planning/counseling discussion - Primary    Advanced directive discussion -wife was HCPOA. Has advanced directives at home - asked to bring [place] copy.Desires CPR. Would not want life support if recovery futile. Unsure about feeding tube.  Abdominal aortic atherosclerosis (HCC)    Continue statin, zetia.       AAA (abdominal aortic aneurysm) without rupture ([place])    Rpt [place] next year.        Other Visit Diagnoses    Special screening for malignant neoplasm of prostate       Relevant Orders   PSA, Medicare       Meds ordered this encounter  Medications  . buPROPion (WELLBUTRIN XL) 150 MG 24 hr tablet    Sig: Take 1 tablet (150 mg total) by mouth daily.    Dispense:  90 tablet    Refill:  3   Orders Placed This Encounter  Procedures  . Lipid panel  . Comprehensive metabolic panel  . CBC with  Differential/Platelet  . TSH  . PSA, Medicare    Patient instructions: I'm so sorry to hear about Lelon Frohlich.  Labs today.  If interested, check with pharmacy about new 2 shot shingles series (shingrix).  Bring [place] copy of your advanced directives to update your chart.  Good to see you today.  Return as needed or in 1 year for next wellness visit.   Follow up plan: Return in about 1 year (around 03/11/2021) for medicare wellness visit.  Ria Bush, MD

## 2020-03-11 NOTE — Assessment & Plan Note (Addendum)
Chronic, stable period on wellbutrin + xanax XR - continue.  Expressed my condolences with wife's recent passing.

## 2020-03-11 NOTE — Assessment & Plan Note (Signed)
Overall stable weight. Stressful period with wife's recent passing.

## 2020-03-11 NOTE — Patient Instructions (Addendum)
I'm so sorry to hear about Anthony Kim.  Labs today.  If interested, check with pharmacy about new 2 shot shingles series (shingrix).  Bring [place] copy of your advanced directives to update your chart.  Good to see you today.  Return as needed or in 1 year for next wellness visit.   Health Maintenance After Age 76 After age 63, you are at a higher risk for certain long-term diseases and infections as well as injuries from falls. Falls are a major cause of broken bones and head injuries in people who are older than age 30. Getting regular preventive care can help to keep you healthy and well. Preventive care includes getting regular testing and making lifestyle changes as recommended by your health care provider. Talk with your health care provider about:  Which screenings and tests you should have. A screening is a test that checks for a disease when you have no symptoms.  A diet and exercise plan that is right for you. What should I know about screenings and tests to prevent falls? Screening and testing are the best ways to find a health problem early. Early diagnosis and treatment give you the best chance of managing medical conditions that are common after age 31. Certain conditions and lifestyle choices may make you more likely to have a fall. Your health care provider may recommend:  Regular vision checks. Poor vision and conditions such as cataracts can make you more likely to have a fall. If you wear glasses, make sure to get your prescription updated if your vision changes.  Medicine review. Work with your health care provider to regularly review all of the medicines you are taking, including over-the-counter medicines. Ask your health care provider about any side effects that may make you more likely to have a fall. Tell your health care provider if any medicines that you take make you feel dizzy or sleepy.  Osteoporosis screening. Osteoporosis is a condition that causes the bones to get weaker.  This can make the bones weak and cause them to break more easily.  Blood pressure screening. Blood pressure changes and medicines to control blood pressure can make you feel dizzy.  Strength and balance checks. Your health care provider may recommend certain tests to check your strength and balance while standing, walking, or changing positions.  Foot health exam. Foot pain and numbness, as well as not wearing proper footwear, can make you more likely to have a fall.  Depression screening. You may be more likely to have a fall if you have a fear of falling, feel emotionally low, or feel unable to do activities that you used to do.  Alcohol use screening. Using too much alcohol can affect your balance and may make you more likely to have a fall. What actions can I take to lower my risk of falls? General instructions  Talk with your health care provider about your risks for falling. Tell your health care provider if: ? You fall. Be sure to tell your health care provider about all falls, even ones that seem minor. ? You feel dizzy, sleepy, or off-balance.  Take over-the-counter and prescription medicines only as told by your health care provider. These include any supplements.  Eat a healthy diet and maintain a healthy weight. A healthy diet includes low-fat dairy products, low-fat (lean) meats, and fiber from whole grains, beans, and lots of fruits and vegetables. Home safety  Remove any tripping hazards, such as rugs, cords, and clutter.  Install safety equipment  such as grab bars in bathrooms and safety rails on stairs.  Keep rooms and walkways well-lit. Activity  Follow a regular exercise program to stay fit. This will help you maintain your balance. Ask your health care provider what types of exercise are appropriate for you.  If you need a cane or walker, use it as recommended by your health care provider.  Wear supportive shoes that have nonskid soles.   Lifestyle  Do not  drink alcohol if your health care provider tells you not to drink.  If you drink alcohol, limit how much you have: ? 0-1 drink a day for women. ? 0-2 drinks a day for men.  Be aware of how much alcohol is in your drink. In the [place] one drink equals one typical bottle of beer (12 oz), one-half glass of wine (5 oz), or one shot of hard liquor (1 oz).  Do not use any products that contain nicotine or tobacco, such as cigarettes and e-cigarettes. If you need help quitting, ask your health care provider. Summary  Having a healthy lifestyle and getting preventive care can help to protect your health and wellness after age 44.  Screening and testing are the best way to find a health problem early and help you avoid having a fall. Early diagnosis and treatment give you the best chance for managing medical conditions that are more common for people who are older than age 46.  Falls are a major cause of broken bones and head injuries in people who are older than age 34. Take precautions to prevent a fall at home.  Work with your health care provider to learn what changes you can make to improve your health and wellness and to prevent falls. This information is not intended to replace advice given to you by your health care provider. Make sure you discuss any questions you have with your health care provider. Document Revised: 04/26/2018 Document Reviewed: 11/16/2016 Elsevier Patient Education  2021 [employer].

## 2020-03-11 NOTE — Assessment & Plan Note (Signed)
On lifelong anticoagulation followed by cardiology coumadin clinic.

## 2020-03-11 NOTE — Assessment & Plan Note (Signed)
Check CBC. On coumadin followed by cardiology clinic.

## 2020-03-11 NOTE — Assessment & Plan Note (Signed)
Chronic, stable on current regimen followed by cardiology.

## 2020-03-11 NOTE — Assessment & Plan Note (Signed)
Rpt [place] next year.

## 2020-03-11 NOTE — Assessment & Plan Note (Signed)
Update FLP on zetia, lipitor.  The ASCVD Risk score Mikey Bussing DC Jr., et al., 2013) failed to calculate for the following reasons:   The valid total cholesterol range is 130 to 320 mg/dL

## 2020-03-12 ENCOUNTER — Other Ambulatory Visit: Payer: Self-pay | Admitting: Cardiovascular Disease

## 2020-03-16 ENCOUNTER — Telehealth: Payer: Self-pay | Admitting: Family Medicine

## 2020-03-16 ENCOUNTER — Other Ambulatory Visit: Payer: Self-pay | Admitting: Cardiovascular Disease

## 2020-03-17 ENCOUNTER — Other Ambulatory Visit: Payer: Self-pay | Admitting: Family Medicine

## 2020-03-17 NOTE — Telephone Encounter (Signed)
Pharmacy requests refill on: Allergy Relief 180 mg   LAST REFILL: 11/06/2018 (Q-30, R-5) LAST OV: 03/11/2020 NEXT OV: Not Scheduled  PHARMACY: [facility] [place], [place]

## 2020-03-17 NOTE — Telephone Encounter (Signed)
I did not deny it but there is no name of medication for this request. Is it fexofenadine? If so should be requested under that name not "allergy relief"

## 2020-03-17 NOTE — Telephone Encounter (Signed)
Dr. Darnell Level, did you deny refill?  I don't see a reason indicated.

## 2020-03-17 NOTE — Telephone Encounter (Signed)
Pharmacy called and they are wanting explanation as to why this was denied. Please advise. EM

## 2020-03-17 NOTE — Telephone Encounter (Signed)
Refill request

## 2020-03-18 NOTE — Telephone Encounter (Signed)
Yes, that's it.  I spoke with [place] and was told it was filled yesterday.

## 2020-03-25 ENCOUNTER — Telehealth: Payer: Self-pay | Admitting: Cardiovascular Disease

## 2020-03-25 NOTE — Telephone Encounter (Signed)
   [facility] Pre-operative Risk Assessment    HEARTCARE STAFF: - Please ensure there is not already an duplicate clearance open for this procedure. - Under Visit Info/Reason for Call, type in Other and utilize the format Clearance MM/DD/YY or Clearance TBD. Do not use dashes or single digits. - If request is for dental extraction, please clarify the # of teeth to be extracted.  Request for surgical clearance:  1. What type of surgery is being performed? 1 simple extraction with bone grafting  2. When is this surgery scheduled? TBD  3. What type of clearance is required (medical clearance vs. Pharmacy clearance to hold med vs. Both)? both  4. Are there any medications that need to be held prior to surgery and how long? Please advise it needing to discontinue blood thinner   5. Practice name and name of physician performing surgery? [facility]   6. What is the office phone number? [phone]   7.   What is the office fax number? [phone]  8.   Anesthesia type (None, local, MAC, general) ? Not noted    Anthony Kim 03/25/2020, 2:36 PM  _________________________________________________________________   (provider comments below)

## 2020-03-25 NOTE — Telephone Encounter (Signed)
Can you please clarify anesthesia? Local vs IV?

## 2020-03-26 NOTE — Telephone Encounter (Signed)
   Primary Cardiologist: Ida Rogue, MD  Chart reviewed as part of pre-operative protocol coverage. Simple dental extractions are considered low risk procedures per guidelines and generally do not require any specific cardiac clearance. It is also generally accepted that for simple extractions and dental cleanings, there is no need to interrupt blood thinner therapy.   SBE prophylaxis is not required for the patient.  I will route this recommendation to the requesting party via Epic fax function and remove from pre-op pool.  Please call with questions.  Deberah Pelton, NP 03/26/2020, 12:16 PM

## 2020-03-26 NOTE — Telephone Encounter (Signed)
Anesthesia-Lidocaine or Septocaine

## 2020-04-06 ENCOUNTER — Telehealth: Payer: Self-pay

## 2020-04-06 NOTE — Chronic Care Management (AMB) (Signed)
Chronic Care Management Pharmacy Assistant   Name: Anthony Kim.  MRN: [id number] DOB: [birth year]  Reason for Encounter: Disease State- Hypertension   Conditions to be addressed/monitored: HTN   Recent office visits:  03/11/20- Dr. Danise Mina - PCP   Recent consult visits:  01[facility]01/20- [facility]:  None in previous 6 months  Medications: Outpatient Encounter Medications as of 04/06/2020  Medication Sig  . acetaminophen (TYLENOL) 325 MG tablet Take 325 mg by mouth every 6 (six) hours as needed for pain.  Marland Kitchen ALLERGY RELIEF 180 MG tablet TAKE ONE TABLET DAILY FOR CONGESTION.  Marland Kitchen ALPRAZolam (XANAX XR) 0.5 MG 24 hr tablet Take 1 tablet (0.5 mg total) by mouth daily.  . AMBULATORY NON FORMULARY MEDICATION Medication Name:     nitroglycerin 0.125% . You should apply a pea size amount to your rectum every 8 hours  [facility] information is below: Address: [address] [place], [place] [address]  [phone]  *Please DO NOT go directly from our office to pick up this medication! Give the pharmacy 1 day to process the prescription as this is compounded at takes time to make.  Marland Kitchen atorvastatin (LIPITOR) 40 MG tablet Take 1 tablet (40 mg total) by mouth daily at 6 PM.  . buPROPion (WELLBUTRIN XL) 150 MG 24 hr tablet Take 1 tablet (150 mg total) by mouth daily.  . cloNIDine (CATAPRES) 0.1 MG tablet Take 1 tablet (0.1 mg total) by mouth 2 (two) times daily.  Marland Kitchen ezetimibe (ZETIA) 10 MG tablet Take 1 tablet (10 mg total) by mouth daily.  . meclizine (ANTIVERT) 25 MG tablet Take 25 mg by mouth 3 (three) times daily as needed for dizziness. Reported on 07/01/2015  . metoprolol tartrate (LOPRESSOR) 25 MG tablet Take 1 tablet (25 mg total) by mouth 2 (two) times daily.  . Multiple Vitamin (MULTIVITAMIN PO) Take 1 tablet by mouth daily. pm  . omeprazole (PRILOSEC) 40 MG capsule Take 1 capsule (40 mg total) by mouth daily.  .  polyethylene glycol (MIRALAX / GLYCOLAX) packet Take 17 g by mouth daily.  Marland Kitchen warfarin (COUMADIN) 5 MG tablet Take up to 1.5 tablets per day by mouth as directed by the coumadin clinic.   No facility-administered encounter medications on file as of 04/06/2020.   Reviewed chart prior to disease state call. Spoke with patient regarding BP  Recent Office Vitals: BP Readings from Last 3 Encounters:  03/11/20 138/90  03/05/20 129/77  04/16/19 (!) 146/92   Pulse Readings from Last 3 Encounters:  03/11/20 81  03/05/20 73  04/16/19 83    Wt Readings from Last 3 Encounters:  03/11/20 220 lb 1 oz (99.8 kg)  04/16/19 224 lb (101.6 kg)  12/20/18 215 lb (97.5 kg)     Kidney Function Lab Results  Component Value Date/Time   CREATININE 0.98 03/11/2020 09:12 AM   CREATININE 1.03 10/18/2018 01:07 PM   GFR 75.33 03/11/2020 09:12 AM   GFRNONAA >60 09/16/2018 12:03 PM   GFRAA >60 09/16/2018 12:03 PM    BMP Latest Ref Rng & Units 03/11/2020 10/18/2018 09/16/2018  Glucose 70 - 99 mg/dL 100(H) 97 117(H)  BUN 6 - 23 mg/dL 16 25(H) 16  Creatinine 0.40 - 1.50 mg/dL 0.98 1.03 1.04  BUN/Creat Ratio 10 - 24 - - -  Sodium 135 - 145 mEq/L 141 142 139  Potassium 3.5 - 5.1 mEq/L 4.8 4.9 4.1  Chloride 96 - 112 mEq/L 103 105 104  CO2 19 - 32 mEq/L 33(H) 30 23  Calcium 8.4 - 10.5 mg/dL 9.2 9.5 9.2    . Current antihypertensive regimen:  ? Clonidine 0.1 mg - 1 tablet twice daily ? Metoprolol tartrate 25 mg - 1 tablet twice daily ? Patient verified he was taking the above medications as prescribed.   . How often are you checking your Blood Pressure? when feeling symptomatic   . Current home BP readings: Does not have any readings. Asked patient to start monitoring once a week in preparation for appointment with Debbora Dus 05/20/20.  . What recent interventions/DTPs have been made by any provider to improve Blood Pressure control since last CPP Visit:  o No recent interventions.  . Any recent  hospitalizations or ED visits since last visit with CPP? No   . What diet changes have been made to improve Blood Pressure Control?  o States he eats a little fats and fried foods. Eats a lot of fruits.   . What exercise is being done to improve your Blood Pressure Control?  o Not excercising  Adherence Review: Is the patient currently on ACE/ARB medication? No Does the patient have >5 day gap between last estimated fill dates? No   Star Rating Drugs: Atorvastatin 40 mg 03/12/20 90 DS   Follow-Up:  Pharmacist Review  Debbora Dus, CPP notified  Margaretmary Dys, [place] [phone]  Total time spent for month: 20

## 2020-04-08 ENCOUNTER — Other Ambulatory Visit: Payer: Self-pay | Admitting: Family Medicine

## 2020-04-08 NOTE — Telephone Encounter (Signed)
Refill request Alprazolam Last refill 03/02/20 #30 Last office visit 03/11/20

## 2020-04-09 NOTE — Telephone Encounter (Signed)
ERx 

## 2020-04-13 ENCOUNTER — Other Ambulatory Visit: Payer: Self-pay | Admitting: Cardiovascular Disease

## 2020-04-13 NOTE — Telephone Encounter (Signed)
Please review for refill. Thanks!  

## 2020-04-15 ENCOUNTER — Other Ambulatory Visit: Payer: Self-pay

## 2020-04-15 ENCOUNTER — Ambulatory Visit (INDEPENDENT_AMBULATORY_CARE_PROVIDER_SITE_OTHER): Payer: Medicare Other

## 2020-04-15 DIAGNOSIS — Z5181 Encounter for therapeutic drug level monitoring: Secondary | ICD-10-CM

## 2020-04-15 DIAGNOSIS — Z86718 Personal history of other venous thrombosis and embolism: Secondary | ICD-10-CM | POA: Diagnosis not present

## 2020-04-15 LAB — POCT INR: INR: 2.9 (ref 2.0–3.0)

## 2020-04-15 NOTE — Patient Instructions (Signed)
-   continue dosage of 1 tablet warfarin daily - recheck in 6 weeks.

## 2020-04-21 NOTE — Progress Notes (Signed)
Cardiology Office Note  Date:  04/22/2020   ID:  Anthony Kim., DOB 04-15-1945, MRN [id number]  PCP:  Ria Bush, MD   Chief Complaint  Patient presents with  . Other    12 month follow up. Meds reviewed verbally with patient.     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, on chronic warfarin smoked for 22 years  Permanent atrial fibrillation, prior successful cardioversion , then back into atrial fibrillation, he elected rate control strategy 2018 who presents for followup  for coronary artery disease, hypertension, persistent atrial fibrillation  LOV 03/2019  Wife passed February 2022, ALS Had been getting sick over the past 6 yrs, falling, muscles and balance getting worse Followed by neurology At the end required tube feeding  He has had a difficult time adjusting, Weight down 4 pounds Sedentary, does not feel like doing much  No sx from atrial fib otherwise Denies leg swelling, 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 on exertion No significant shortness of breath, ambulating well  In the past has been active on the farm, managing rental properties  Lower extremity edema stable, compression hose on  EKG personally reviewed by myself on todays visit Shows atrial fibrillation rate 71 bpm nonspecific ST abnormality  Other past medical history reviewed Permanent atrial fibrillation starting back in 2018 Asymptomatic from his atrial fibrillation Prefered to stay on warfarin  In the hospital; 08/2018 CT scan reviewed in detail acute pancreatitis. Aortic Atherosclerosis also noted  Weight down 20 pounds in one year  EKG personally reviewed by myself on  todays visit Shows atrial fibrillation rate 71 bpm no significant ST or T wave changes  Other past medical history reviewed Prior CT scan of the chest from 2014   LAD and left circumflex coronary artery disease  He wears compression hose given his history of DVT   PMH:   has a past medical history of Anxiety, CAD (coronary artery disease), Clotting disorder ([place]), Colon polyps, Cough, Deep vein thrombosis (DVT) ([place]) (04/06/2010), Depression, DVT (deep venous thrombosis) ([place]), GERD (gastroesophageal reflux disease), Hyperlipidemia, Hypertension, IBS (irritable bowel syndrome), NICM (nonischemic cardiomyopathy) ([place]), Palpitations, Persistent atrial fibrillation ([place]), Pneumonia, Presence of partial dental prosthetic device, and Seasonal allergies.  PSH:    Past Surgical History:  Procedure Laterality Date  . APPENDECTOMY     1967  . CARDIOVERSION N/A 08/03/2016   Procedure: CARDIOVERSION;  Surgeon: Minna Merritts, MD;  Location: [facility];  Service: Cardiovascular;  Laterality: N/A;  . CATARACT EXTRACTION W/PHACO Right 07/08/2015   Procedure: CATARACT EXTRACTION PHACO AND INTRAOCULAR LENS PLACEMENT ([place]) RIGHT EYE;  Surgeon: Leandrew Koyanagi, MD;  Location: [place];  Service: Ophthalmology;  Laterality: Right;   SYMFONY LENS  . CATARACT EXTRACTION W/PHACO Left 08/05/2015   Procedure: CATARACT EXTRACTION PHACO AND INTRAOCULAR LENS PLACEMENT ([place]) left eye;  Surgeon: Leandrew Koyanagi, MD;  Location: [place];  Service: Ophthalmology;  Laterality: Left;  SYMFONY LENS  . CATARACT EXTRACTION, BILATERAL Bilateral 08/2015  . COLONOSCOPY     x3  . POLYPECTOMY    . TONSILLECTOMY AND ADENOIDECTOMY      Current Outpatient Medications  Medication Sig Dispense Refill  . acetaminophen (TYLENOL) 325 MG tablet Take  325 mg by mouth every 6 (six) hours as needed for pain.    Marland Kitchen ALLERGY RELIEF 180 MG tablet TAKE ONE TABLET DAILY FOR CONGESTION. 30 tablet 5  . ALPRAZolam  (XANAX XR) 0.5 MG 24 hr tablet Take 1 tablet (0.5 mg total) by mouth daily. 30 tablet 0  . AMBULATORY NON FORMULARY MEDICATION Medication Name:     nitroglycerin 0.125% . You should apply a pea size amount to your rectum every 8 hours  [facility] information is below: Address: [address] [place], [place] [address]  [phone]  *Please DO NOT go directly from our office to pick up this medication! Give the pharmacy 1 day to process the prescription as this is compounded at takes time to make. 30 g 1  . atorvastatin (LIPITOR) 40 MG tablet Take 1 tablet (40 mg total) by mouth daily at 6 PM. 90 tablet 0  . buPROPion (WELLBUTRIN XL) 150 MG 24 hr tablet Take 1 tablet (150 mg total) by mouth daily. 90 tablet 3  . cloNIDine (CATAPRES) 0.1 MG tablet Take 1 tablet (0.1 mg total) by mouth 2 (two) times daily. 60 tablet 7  . ezetimibe (ZETIA) 10 MG tablet Take 1 tablet (10 mg total) by mouth daily. 90 tablet 3  . meclizine (ANTIVERT) 25 MG tablet Take 25 mg by mouth 3 (three) times daily as needed for dizziness. Reported on 07/01/2015    . metoprolol tartrate (LOPRESSOR) 25 MG tablet Take 1 tablet (25 mg total) by mouth 2 (two) times daily. 180 tablet 3  . Multiple Vitamin (MULTIVITAMIN PO) Take 1 tablet by mouth daily. pm    . omeprazole (PRILOSEC) 40 MG capsule Take 1 capsule (40 mg total) by mouth daily. 30 capsule 7  . polyethylene glycol (MIRALAX / GLYCOLAX) packet Take 17 g by mouth daily.    Marland Kitchen warfarin (COUMADIN) 5 MG tablet Take up to 1.5 tablets per day by mouth as directed by the coumadin clinic. 40 tablet 6   No current facility-administered medications for this visit.     Allergies:   Iodine   Social History:  The patient  reports that he quit smoking about 28 years ago. His smoking use included cigarettes. He has a 30.00 pack-year smoking history. He has never used smokeless tobacco. He reports current alcohol use of about 7.0 - 14.0 standard drinks of alcohol per  week. He reports that he does not use drugs.   Family History:   family history includes Heart attack in his father; Heart disease in his mother; Hypertension in his father and mother; Irritable bowel syndrome in his mother; Prostate cancer in his father.    Review of Systems: Review of Systems  Constitutional: Negative.   HENT: Negative.   Respiratory: Negative.   Cardiovascular: Positive for leg swelling.  Gastrointestinal: Negative.   Musculoskeletal: Negative.   Neurological: Negative.   Psychiatric/Behavioral: Negative.   All other systems reviewed and are negative.   PHYSICAL EXAM: VS:  BP 140/90 (BP Location: Left Arm, Patient Position: Sitting, Cuff Size: Normal)   Pulse 71   Ht 5\' 11"  (1.803 m)   Wt 220 lb (99.8 kg)   SpO2 99%   BMI 30.68 kg/m  , BMI Body mass index is 30.68 kg/m.  Constitutional:  oriented to person, place, and time. No distress.  HENT:  Head: Grossly normal Eyes:  no discharge. No scleral icterus.  Neck: No JVD, no carotid bruits  Cardiovascular: Regular rate and rhythm, no murmurs appreciated  Trace lower extremity edema, compression hose in place Pulmonary/Chest: Clear to auscultation bilaterally, no wheezes or rales Abdominal: Soft.  no distension.  no tenderness.  Musculoskeletal: Normal range of motion Neurological:  normal muscle tone. Coordination normal. No atrophy Skin: Skin warm and dry Psychiatric: normal affect, pleasant  Recent Labs: 03/11/2020: ALT 25; BUN 16; Creatinine, Ser 0.98; Hemoglobin 14.5; Platelets 129.0; Potassium 4.8; Sodium 141; TSH 2.95    Lipid Panel Lab Results  Component Value Date   CHOL 129 03/11/2020   HDL 53.50 03/11/2020   LDLCALC 58 03/11/2020   TRIG 92.0 03/11/2020    Wt Readings from Last 3 Encounters:  04/22/20 220 lb (99.8 kg)  03/11/20 220 lb 1 oz (99.8 kg)  04/16/19 224 lb (101.6 kg)    ASSESSMENT AND PLAN:   Atrial fibrillation,persistent  Previously declined repeat  cardioversion Asymptomatic atrial fibrillation  does not want a NOAC On warfarin  Essential hypertension -  Blood pressure relatively well controlled, no changes made stable  HYPERCHOLESTEROLEMIA Continue Lipitor Zetia, numbers at goal  Localized edema Chronic lower extremity edema, stable, unchanged On warfarin for history of DVT He wears compression hose Would suggest he stay active, leg elevation when sitting  History of DVT On anticoagulation  Obesity We have encouraged continued exercise, careful diet management in an effort to lose weight.  Adjustment disorder Recent loss of his wife to ALS,  Has family supports, recommended regular walking program    Total encounter time more than 25 minutes  Greater than 50% was spent in counseling and coordination of care with the patient     No orders of the defined types were placed in this encounter.    Signed, Esmond Plants, M.D., Ph.D. 04/22/2020  [place], [place]

## 2020-04-22 ENCOUNTER — Encounter: Payer: Self-pay | Admitting: Cardiovascular Disease

## 2020-04-22 ENCOUNTER — Other Ambulatory Visit: Payer: Self-pay

## 2020-04-22 ENCOUNTER — Ambulatory Visit (INDEPENDENT_AMBULATORY_CARE_PROVIDER_SITE_OTHER): Payer: Medicare Other | Admitting: Cardiovascular Disease

## 2020-04-22 VITALS — BP 140/90 | HR 71 | Ht 71.0 in | Wt 220.0 lb

## 2020-04-22 DIAGNOSIS — I4819 Other persistent atrial fibrillation: Secondary | ICD-10-CM

## 2020-04-22 DIAGNOSIS — I10 Essential (primary) hypertension: Secondary | ICD-10-CM

## 2020-04-22 DIAGNOSIS — I82491 Acute embolism and thrombosis of other specified deep vein of right lower extremity: Secondary | ICD-10-CM

## 2020-04-22 DIAGNOSIS — E7800 Pure hypercholesterolemia, unspecified: Secondary | ICD-10-CM

## 2020-04-22 DIAGNOSIS — I714 Abdominal aortic aneurysm, without rupture, unspecified: Secondary | ICD-10-CM

## 2020-04-22 NOTE — Patient Instructions (Signed)
Medication Instructions:  No changes  If you need a refill on your cardiac medications before your next appointment, please call your pharmacy.    Lab work: No new labs needed   If you have labs (blood work) drawn today and your tests are completely normal, you will receive your results only by: . MyChart Message (if you have MyChart) OR . A paper copy in the mail If you have any lab test that is abnormal or we need to change your treatment, we will call you to review the results.   Testing/Procedures: No new testing needed   Follow-Up: At [facility], you and your health needs are our priority.  As part of our continuing mission to provide you with exceptional heart care, we have created designated Provider Care Teams.  These Care Teams include your primary Cardiologist (physician) and Advanced Practice Providers (APPs -  Physician Assistants and Nurse Practitioners) who all work together to provide you with the care you need, when you need it.  . You will need a follow up appointment in 12 months  . Providers on your designated Care Team:   . Christopher Berge, NP . Ryan Dunn, PA-C . Jacquelyn Visser, PA-C  Any Other Special Instructions Will Be Listed Below (If Applicable).  COVID-19 Vaccine Information can be found at: [email or web address] For questions related to vaccine distribution or appointments, please email [email or web address] or call [phone].     

## 2020-04-27 ENCOUNTER — Other Ambulatory Visit: Payer: Self-pay | Admitting: Cardiovascular Disease

## 2020-05-06 ENCOUNTER — Other Ambulatory Visit: Payer: Self-pay | Admitting: Cardiovascular Disease

## 2020-05-07 ENCOUNTER — Other Ambulatory Visit: Payer: Self-pay | Admitting: Family Medicine

## 2020-05-07 NOTE — Telephone Encounter (Signed)
ERx 

## 2020-05-19 ENCOUNTER — Telehealth: Payer: Self-pay

## 2020-05-19 NOTE — Chronic Care Management (AMB) (Addendum)
    Chronic Care Management Pharmacy Assistant   Name: Anthony Kim.  MRN: [id number] DOB: [birth year]  Reason for Encounter:  Reminder Call/Chart Review   Conditions to be addressed/monitored: HTN  Recent office visits:  None since last CCM contact  Recent consult visits:  04/22/2020  Dr.Timothy Rockey Situ, Cardiology - No medication changes  Hospital visits:  None in previous 6 months  Medications: Outpatient Encounter Medications as of 05/19/2020  Medication Sig   acetaminophen (TYLENOL) 325 MG tablet Take 325 mg by mouth every 6 (six) hours as needed for pain.   ALLERGY RELIEF 180 MG tablet TAKE ONE TABLET DAILY FOR CONGESTION.   ALPRAZolam (XANAX XR) 0.5 MG 24 hr tablet Take 1 tablet (0.5 mg total) by mouth daily.   AMBULATORY NON FORMULARY MEDICATION Medication Name:     nitroglycerin 0.125% . You should apply a pea size amount to your rectum every 8 hours  [facility] information is below: Address: [address], [place], [place] [address]  [phone]  *Please DO NOT go directly from our office to pick up this medication! Give the pharmacy 1 day to process the prescription as this is compounded at takes time to make.   atorvastatin (LIPITOR) 40 MG tablet Take 1 tablet (40 mg total) by mouth daily at 6 PM.   buPROPion (WELLBUTRIN XL) 150 MG 24 hr tablet Take 1 tablet (150 mg total) by mouth daily.   cloNIDine (CATAPRES) 0.1 MG tablet Take 1 tablet (0.1 mg total) by mouth 2 (two) times daily.   ezetimibe (ZETIA) 10 MG tablet Take 1 tablet (10 mg total) by mouth daily.   meclizine (ANTIVERT) 25 MG tablet Take 25 mg by mouth 3 (three) times daily as needed for dizziness. Reported on 07/01/2015   metoprolol tartrate (LOPRESSOR) 25 MG tablet Take 1 tablet (25 mg total) by mouth 2 (two) times daily.   Multiple Vitamin (MULTIVITAMIN PO) Take 1 tablet by mouth daily. pm   omeprazole (PRILOSEC) 40 MG capsule Take 1 capsule (40 mg total) by mouth daily.    polyethylene glycol (MIRALAX / GLYCOLAX) packet Take 17 g by mouth daily.   warfarin (COUMADIN) 5 MG tablet Take up to 1.5 tablets per day by mouth as directed by the coumadin clinic.   No facility-administered encounter medications on file as of 05/19/2020.   [name]. was contacted to remind him of his upcoming telephone visit with Debbora Dus on 05/20/2020 at 9:45am   he was reminded to have all medications, supplements and any blood glucose and blood pressure readings available for review at appointment.  Are you having any problems with your medications? The patient reports no problems.  What concerns would you like to discuss with the pharmacist? The patient did not report any concerns.   Star Rating Drugs: Medication:  Last Fill: Day Supply Atorvastatin 40mg . 03/12/2020 90ds   Debbora Dus, CPP notified  Avel Sensor, [place] Assistant [phone]  I have reviewed the care management and care coordination activities outlined in this encounter and I am certifying that I agree with the content of this note. No further action required.  Debbora Dus, PharmD Clinical Pharmacist [facility] at [facility] [phone]

## 2020-05-20 ENCOUNTER — Other Ambulatory Visit: Payer: Self-pay

## 2020-05-20 ENCOUNTER — Ambulatory Visit (INDEPENDENT_AMBULATORY_CARE_PROVIDER_SITE_OTHER): Payer: Medicare Other

## 2020-05-20 DIAGNOSIS — F331 Major depressive disorder, recurrent, moderate: Secondary | ICD-10-CM

## 2020-05-20 DIAGNOSIS — E7800 Pure hypercholesterolemia, unspecified: Secondary | ICD-10-CM

## 2020-05-20 DIAGNOSIS — F419 Anxiety disorder, unspecified: Secondary | ICD-10-CM

## 2020-05-20 DIAGNOSIS — I10 Essential (primary) hypertension: Secondary | ICD-10-CM

## 2020-05-20 NOTE — Patient Instructions (Addendum)
Dear Anthony Kim.,  Below is a summary of the goals we discussed during our follow up appointment on May 20, 2020. Please contact me anytime with questions or concerns.   Visit Information  Patient Care Plan: CCM Pharmacy Care Plan    Problem Identified: [facility] AMB "PATIENT-SPECIFIC PROBLEM"     Long-Range Goal: Disease Management   Start Date: 05/20/2020  Priority: High  Note:    Current Barriers:  . Low activity level   Pharmacist Clinical Goal(s):  Marland Kitchen Patient will contact provider office for questions/concerns as evidenced notation of same in electronic health record through collaboration with PharmD and provider.   Interventions: . 1:1 collaboration with Anthony Bush, MD regarding development and update of comprehensive plan of care as evidenced by provider attestation and co-signature . Inter-disciplinary care team collaboration (see longitudinal plan of care) . Comprehensive medication review performed; medication list updated in electronic medical record  Hypertension (BP goal <140/90) -Controlled - per home and clinic readings  -Current treatment:  Clonidine 0.1 mg - 1 tablet twice daily  Metoprolol tartrate 25 mg - 1 tablet twice daily  -Medications previously tried: losartan -Pertinent history: per cardio note 2017 - pt was on losartan and metoprolol, clonidine added, avoid CCB due to LLE, avoid diuretics due to renal impairment; 2018 - losartan was discontinued due to decreased GFR, continue metoprolol and clonidine -Current home readings: 132/82 (yesterday morning) -Current dietary habits: Reports current weight ~ 216 lbs. Eating small portions. Minimal appetite. Diet high in fruits and vegetables.  -Current exercise habits: none formal, stays busy with usual household maintenance  -Denies hypotensive/hypertensive symptoms -Denies any falls  -Educated on BP goals and benefits of medications for prevention of heart attack, stroke and kidney damage; -Counseled  to monitor BP at home monthly or with symptoms of hypotension, document, and provide log at future appointments -Recommended to continue current medication; Recommend increasing walking as able. Try 10 minutes a couple days per week.  Hyperlipidemia: (LDL goal < 70) -Controlled - LDL 58 -Current treatment:  Atorvastatin 40 mg - 1 tablet daily  Zetia 10 mg - 1 tablet daily -Medications previously tried: none -Educated on Cholesterol goals;  -Recommended to continue current medication  Atrial Fibrillation (Goal: prevent stroke and major bleeding) -Controlled - per cardio visit 04/22/20  -History of DVT RLE -Current treatment: . Rate control: Metoprolol tartrate 25 mg - 1 tablet twice daily  . Anticoagulation: Warfarin 5 mg - 1 tablet daily (current dose per patient 05/20/20) -Medications previously tried: none - declines interest in NOAC -Patient denies any recent changes in warfarin dose, INR has been stable. Following every 6 weeks. -Denies abnormal bruising or bleeding -Counseled on bleeding risk associated with warfarin and importance of self-monitoring for signs/symptoms of bleeding; avoidance of NSAIDs due to increased bleeding risk with anticoagulants; -Recommended to continue current medication  Depression/Anxiety (Goal: improve mood) -Controlled - per patient medications are working well  -Current treatment: . Xanax 0.5 mg  XR - 1 tablet daily . Bupropion 150 mg - 1 tablet daily  -Medications previously tried/failed: none reported -Patient reports less interest in usual activities since wife passed in February 2022 -He has good family support  -Recommended to continue current medication  Patient Goals/Self-Care Activities . Patient will:  - focus on medication adherence by continuing to use a pillbox  Follow Up Plan: Telephone follow up appointment with care management team member scheduled for:  12 months       The patient verbalized understanding of  instructions,  educational materials, and care plan provided today and agreed to receive a mailed copy of patient instructions, educational materials, and care plan.    Anthony Kim, PharmD Clinical Pharmacist [facility] at [facility] [phone]    Basics of Medicine Management Taking your medicines correctly is an important part of managing or preventing medical problems. Make sure you know what disease or condition your medicine is treating, and how and when to take it. If you do not take your medicine correctly, it may not work well and may cause unpleasant side effects, including serious health problems. What should I do when I am taking medicines?  Read all the labels and inserts that come with your medicines. Review the information often.  Talk with your pharmacist if you get a refill and notice a change in the size, color, or shape of your medicines.  Know the potential side effects for each medicine that you take.  Try to get all your medicines from the same pharmacy. The pharmacist will have all your information and will understand how your medicines will affect each other (interact).  Tell your health care provider about all your medicines, including over-the-counter medicines, vitamins, and herbal or dietary supplements. He or she will make sure that nothing will interact with any of your prescribed medicines.   How can I take my medicines safely?  Take medicines only as told by your health care provider. ? Do not take more of your medicine than instructed. ? Do not take anyone else's medicines. ? Do not share your medicines with others. ? Do not stop taking your medicines unless your health care provider tells you to do so. ? You may need to avoid alcohol or certain foods or liquids when taking certain medicines. Follow your health care provider's instructions.  Do not split, mash, or chew your medicines unless your health care provider tells you to do so. Tell your health  care provider if you have trouble swallowing your medicines.  For liquid medicine, use the dosing container that was provided. How should I organize my medicines? Know your medicines  Know what each of your medicines looks like. This includes size, color, and shape. Tell your health care provider if you are having trouble recognizing all the medicines that you are taking.  If you cannot tell your medicines apart because they look similar, keep them in original bottles.  If you cannot read the labels on the bottles, tell your pharmacist to put your medicines in containers with large print.  Review your medicines and your schedule with family members, a friend, or a caregiver. Use a pill organizer  Use a tool to organize your medicine schedule. Tools include a weekly pillbox, a written chart, a notebook, or a calendar.  Your tool should help you remember the following things about each medicine: ? The name of the medicine. ? The amount (dose) to take. ? The schedule. This is the day and time the medicine should be taken. ? The appearance. This includes color, shape, size, and stamp. ? How to take your medicines. This includes instructions to take them with food, without food, with fluids, or with other medicines.  Create reminders for taking your medicines. Use sticky notes, or alarms on your watch, mobile device, or phone calendar.  You may choose to use a more advanced management system. These systems have storage, alarms, and visual and audio prompts.  Some medicines can be taken on an "as-needed" basis. These include medicines  for nausea or pain. If you take an as-needed medicine, write down the name and dose, as well as the date and time that you took it.   How should I plan for travel?  Take your pillbox, medicines, and organization system with you when traveling.  Have your medicines refilled before you travel. This will ensure that you do not run out of your medicines while you  are away from home.  Always carry an updated list of your medicines with you. If there is an emergency, a first responder can quickly see what medicines you are taking.  Do not pack your medicines in checked luggage in case your luggage is lost or delayed.  If any of your medicines is considered a controlled substance, make sure you bring a letter from your health care provider with you. How should I store and discard my medicines? For safe storage:  Store medicines in a cool, dry area away from light, or as directed by your health care provider. Do not store medicines in the bathroom. Heat and humidity will affect them.  Do not store your medicines with other chemicals, or with medicines for pets or other household members.  Keep medicines away from children and pets. Do not leave them on counters or bedside tables. Store them in high cabinets or on high shelves. For safe disposal:  Check expiration dates regularly. Do not take expired medicines. Discard medicines that are older than the expiration date.  Learn a safe way to dispose of your medicines. You may: ? Use a local government, hospital, or pharmacy medicine-take-back program. ? Mix the medicines with inedible substances, put them in a sealed bag or empty container, and throw them in the trash. What should I remember?  Tell your health care provider if you: ? Experience side effects. ? Have new symptoms. ? Have other concerns about taking your medicines.  Review your medicines regularly with your health care provider. Other medicines, diet, medical conditions, weight changes, and daily habits can all affect how medicines work. Ask if you need to continue taking each medicine, and discuss how well each one is working.  Refill your medicines early to avoid running out of them.  In case of an accidental overdose, call your local [place] at [phone] or visit your local emergency department immediately. This  is important. Summary  Taking your medicines correctly is an important part of managing or preventing medical problems.  You need to make sure that you understand what you are taking a medicine for, as well as how and when you need to take it.  Know your medicines and use a pill organizer to help you take your medicines correctly.  In case of an accidental overdose, call your local [place] at [phone] or visit your local emergency department immediately. This is important. This information is not intended to replace advice given to you by your health care provider. Make sure you discuss any questions you have with your health care provider. Document Revised: 12/29/2016 Document Reviewed: 12/29/2016 Elsevier Patient Education  2021 [employer].

## 2020-05-20 NOTE — Progress Notes (Signed)
Chronic Care Management Pharmacy Note  05/20/2020 Name:  Keysean Savino. MRN:  [id number] DOB:  1945-09-23  Subjective: Mackenzy Eisenberg. is an 75 y.o. year old male who is a primary patient of Ria Bush, MD.  The CCM team was consulted for assistance with disease management and care coordination needs.    Engaged with patient by telephone for follow up visit in response to provider referral for pharmacy case management and/or care coordination services. Reports health is an 8/10.  Denies recent medication changes.  Consent to Services:  The patient was given information about Chronic Care Management services, agreed to services, and gave verbal consent prior to initiation of services.  Please see initial visit note for detailed documentation.   Patient Care Team: Ria Bush, MD as PCP - General (Family Medicine) Rockey Situ Kathlene November, MD as PCP - Cardiology (Cardiology) Minna Merritts, MD as Consulting Physician (Cardiology) Inda Castle, MD (Inactive) as Consulting Physician (Gastroenterology) McDiarmid, Blane Ohara, MD as Consulting Physician (Family Medicine) Bjorn Loser, MD as Consulting Physician (Urology) Philis Kendall, MD as Consulting Physician (Ophthalmology) Gaynelle Arabian, MD as Consulting Physician (Orthopedic Surgery) Leandrew Koyanagi, MD as Referring Physician (Ophthalmology) Debbora Dus, [facility] as Pharmacist (Pharmacist)  Recent office visits: 03/11/20- Dr. Danise Mina - PCP - Continue current medications. RTC 1 year.  Recent consult visits:  04/22/2020- Dr.Timothy Gollan, Cardiology - No medication changes [facility]20[facility]  None in previous 6 months  Objective:  Lab Results  Component Value Date   CREATININE 0.98 03/11/2020   BUN 16 03/11/2020   GFR 75.33 03/11/2020   GFRNONAA >60 09/16/2018   GFRAA >60 09/16/2018   NA 141 03/11/2020   K 4.8 03/11/2020   CALCIUM 9.2 03/11/2020    CO2 33 (H) 03/11/2020   GLUCOSE 100 (H) 03/11/2020    Lab Results  Component Value Date/Time   GFR 75.33 03/11/2020 09:12 AM   GFR 70.54 10/18/2018 01:07 PM    Lab Results  Component Value Date   CHOL 129 03/11/2020   HDL 53.50 03/11/2020   LDLCALC 58 03/11/2020   LDLDIRECT 99.5 06/24/2013   TRIG 92.0 03/11/2020   CHOLHDL 2 03/11/2020    Hepatic Function Latest Ref Rng & Units 03/11/2020 10/18/2018 09/16/2018  Total Protein 6.0 - 8.3 g/dL 6.6 6.7 7.1  Albumin 3.5 - 5.2 g/dL 4.0 4.2 3.9  AST 0 - 37 U/L 22 25 23   ALT 0 - 53 U/L 25 38 27  Alk Phosphatase 39 - 117 U/L 69 76 68  Total Bilirubin 0.2 - 1.2 mg/dL 1.1 1.3(H) 1.4(H)  Bilirubin, Direct 0.0 - 0.2 mg/dL - - 0.3(H)    Lab Results  Component Value Date/Time   TSH 2.95 03/11/2020 09:12 AM   TSH 1.15 04/30/2008 10:16 AM    CBC Latest Ref Rng & Units 03/11/2020 09/16/2018 06/22/2017  WBC 4.0 - 10.5 K/uL 5.7 9.5 6.2  Hemoglobin 13.0 - 17.0 g/dL 14.5 15.0 15.8  Hematocrit 39.0 - 52.0 % 42.5 43.7 46.8  Platelets 150.0 - 400.0 K/uL 129.0(L) 152 157.0    No results found for: VD25OH  Clinical ASCVD: Yes - CAD The ASCVD Risk score Mikey Bussing DC Jr., et al., 2013) failed to calculate for the following reasons:   The valid total cholesterol range is 130 to 320 mg/dL    Depression screen [facility] 2/9 03/05/2020 07/03/2018 06/22/2017  Decreased Interest 1 0 0  Down, Depressed, Hopeless 1 0 0  PHQ - 2  Score 2 0 0  Altered sleeping 0 0 0  Tired, decreased energy 0 0 0  Change in appetite 0 0 0  Feeling bad or failure about yourself  0 0 0  Trouble concentrating 0 0 0  Moving slowly or fidgety/restless 0 0 0  Suicidal thoughts 0 0 0  PHQ-9 Score 2 0 0  Difficult doing work/chores Not difficult at all Not difficult at all Not difficult at all  Some recent data might be hidden    Social History   Tobacco Use  Smoking Status Former Smoker  . Packs/day: 1.00  . Years: 30.00  . Pack years: 30.00  . Types: Cigarettes  . Quit date:  01/18/1992  . Years since quitting: 28.3  Smokeless Tobacco Never Used   BP Readings from Last 3 Encounters:  04/22/20 140/90  03/11/20 138/90  03/05/20 129/77   Pulse Readings from Last 3 Encounters:  04/22/20 71  03/11/20 81  03/05/20 73   Wt Readings from Last 3 Encounters:  04/22/20 220 lb (99.8 kg)  03/11/20 220 lb 1 oz (99.8 kg)  04/16/19 224 lb (101.6 kg)   BMI Readings from Last 3 Encounters:  04/22/20 30.68 kg/m  03/11/20 30.69 kg/m  04/16/19 31.24 kg/m    Assessment/Interventions: Review of patient past medical history, allergies, medications, health status, including review of consultants reports, laboratory and other test data, was performed as part of comprehensive evaluation and provision of chronic care management services.   SDOH:  (Social Determinants of Health) assessments and interventions performed: Yes SDOH Interventions   Flowsheet Row Most Recent Value  SDOH Interventions   Financial Strain Interventions Intervention Not Indicated  [Medications affordable]     SDOH Screenings   Alcohol Screen: Low Risk   . Last Alcohol Screening Score (AUDIT): 4  Depression (PHQ2-9): Low Risk   . PHQ-2 Score: 2  Financial Resource Strain: Low Risk   . Difficulty of Paying Living Expenses: Not hard at all  Food Insecurity: No Food Insecurity  . Worried About Charity fundraiser in the Last Year: Never true  . Ran Out of Food in the Last Year: Never true  Housing: Low Risk   . Last Housing Risk Score: 0  Physical Activity: Inactive  . Days of Exercise per Week: 0 days  . Minutes of Exercise per Session: 0 min  Social Connections: Not on file  Stress: No Stress Concern Present  . Feeling of Stress : Not at all  Tobacco Use: Medium Risk  . Smoking Tobacco Use: Former Smoker  . Smokeless Tobacco Use: Never Used  Transportation Needs: No Transportation Needs  . Lack of Transportation (Medical): No  . Lack of Transportation (Non-Medical): No    CCM Care  Plan  Allergies  Allergen Reactions  . Iodine Hives    Contrast dye/ betadine ok    Medications Reviewed Today    Reviewed by Debbora Dus, [facility] (Pharmacist) on 05/20/20 at [place] List Status: <None>  Medication Order Taking? Sig Documenting Provider Last Dose Status Informant  acetaminophen (TYLENOL) 325 MG tablet [id number] Yes Take 650 mg by mouth every 6 (six) hours as needed for pain. Takes 650 mg once as needed, sparingly [provider] Taking Active Self  ALLERGY RELIEF 180 MG tablet [id number] Yes TAKE ONE TABLET DAILY FOR CONGESTION. Ria Bush, MD Taking Active   ALPRAZolam (XANAX XR) 0.5 MG 24 hr tablet [id number] Yes Take 1 tablet (0.5 mg total) by mouth daily. Ria Bush, MD Taking  Active   atorvastatin (LIPITOR) 40 MG tablet [id number] Yes Take 1 tablet (40 mg total) by mouth daily at 6 PM. Rockey Situ, Kathlene November, MD Taking Active   buPROPion (WELLBUTRIN XL) 150 MG 24 hr tablet [id number] Yes Take 1 tablet (150 mg total) by mouth daily. Ria Bush, MD Taking Active   cloNIDine (CATAPRES) 0.1 MG tablet [id number] Yes Take 1 tablet (0.1 mg total) by mouth 2 (two) times daily. Minna Merritts, MD Taking Active   ezetimibe (ZETIA) 10 MG tablet [id number] Yes Take 1 tablet (10 mg total) by mouth daily. Minna Merritts, MD Taking Active   meclizine (ANTIVERT) 25 MG tablet [id number] Yes Take 25 mg by mouth 3 (three) times daily as needed for dizziness. Reported on 07/01/2015 [provider] Taking Active Self           Med Note Jilda Roche A   Mon Aug 01, 2016 12:51 PM)    metoprolol tartrate (LOPRESSOR) 25 MG tablet [id number] Yes Take 1 tablet (25 mg total) by mouth 2 (two) times daily. Minna Merritts, MD Taking Active   Multiple Vitamin (MULTIVITAMIN PO) [id number] Yes Take 1 tablet by mouth daily. pm [provider] Taking Active Self  omeprazole (PRILOSEC) 40 MG capsule [id number] Yes Take 1 capsule (40 mg total) by mouth daily. Minna Merritts, MD Taking Active   warfarin (COUMADIN) 5 MG tablet [id number] Yes Take up to 1.5 tablets per day by mouth as directed by the coumadin clinic. Minna Merritts, MD Taking Active           Patient Active Problem List   Diagnosis Date Noted  . Caregiver role strain 12/21/2018  . Hearing loss due to cerumen impaction, right 10/18/2018  . Abdominal aortic atherosclerosis ([place]) 09/17/2018  . AAA (abdominal aortic aneurysm) without rupture ([place]) 09/17/2018  . Mass of pancreas 09/17/2018  . Advanced care planning/counseling discussion 06/29/2017  . Long-term use of high-risk medication 01/28/2017  . Persistent atrial fibrillation ([place]) 12/10/2016  . Obesity, Class I, BMI 30.0-34.9 (see actual BMI) 09/09/2014  . Renal insufficiency 06/23/2014  . Encounter for therapeutic drug monitoring 03/20/2013  . Sleep apnea 09/05/2012  . Allergic rhinitis 03/22/2012  . Anxiety 02/04/2011  . Long term current use of anticoagulant 04/06/2010  . History of DVT of lower extremity 07/18/2008  . SKIN CANCER, HX OF 10/30/2007  . ERECTILE DYSFUNCTION 04/30/2007  . COLONIC POLYPS 2/08 07/12/2006  . HYPERCHOLESTEROLEMIA 04/20/2006  . MDD (major depressive disorder), recurrent episode, moderate ([place]) 04/20/2006  . Essential hypertension 04/20/2006    Immunization History  Administered Date(s) Administered  . Fluad Quad(high Dose 65+) 10/18/2018, 10/30/2019  . Influenza Split 10/31/2011  . Influenza Whole 10/30/2007, 10/28/2008, 10/27/2009  . Influenza,inj,Quad PF,6+ Mos 11/13/2013, 10/14/2014, 10/01/2015, 01/12/2017, 11/09/2017  . Moderna Sars-Covid-2 Vaccination 03/06/2019, 04/03/2019, 11/21/2019  . Pneumococcal Conjugate-13 11/13/2013  . Pneumococcal Polysaccharide-23 03/23/2011  . Td 08/03/2000, 09/16/2008  . Zoster 10/28/2008    Conditions to be addressed/monitored:  Hypertension, Hyperlipidemia and Depression  Care Plan : [place]  Updates made by Debbora Dus, [place]  since 05/20/2020 12:00 AM    Problem: CHL AMB "PATIENT-SPECIFIC PROBLEM"     Long-Range Goal: Disease Management   Start Date: 05/20/2020  Priority: High  Note:    Current Barriers:  . Low activity level   Pharmacist Clinical Goal(s):  Marland Kitchen Patient will contact provider office for questions/concerns as evidenced notation of same in electronic health record through collaboration with PharmD and provider.  Interventions: . 1:1 collaboration with Ria Bush, MD regarding development and update of comprehensive plan of care as evidenced by provider attestation and co-signature . Inter-disciplinary care team collaboration (see longitudinal plan of care) . Comprehensive medication review performed; medication list updated in electronic medical record  Hypertension (BP goal <140/90) -Controlled - per home and clinic readings  -Current treatment:  Clonidine 0.1 mg - 1 tablet twice daily  Metoprolol tartrate 25 mg - 1 tablet twice daily  -Medications previously tried: losartan -Pertinent history: per cardio note 2017 - pt was on losartan and metoprolol, clonidine added, avoid CCB due to LLE, avoid diuretics due to renal impairment; 2018 - losartan was discontinued due to decreased GFR, continue metoprolol and clonidine -Current home readings: 132/82 (yesterday morning) -Current dietary habits: Reports current weight ~ 216 lbs. Eating small portions. Minimal appetite. Diet high in fruits and vegetables.  -Current exercise habits: none formal, stays busy with usual household maintenance  -Denies hypotensive/hypertensive symptoms -Denies any falls  -Educated on BP goals and benefits of medications for prevention of heart attack, stroke and kidney damage; -Counseled to monitor BP at home monthly or with symptoms of hypotension, document, and provide log at future appointments -Recommended to continue current medication; Recommend increasing walking as able. Try 10 minutes a couple days per  week.  Hyperlipidemia: (LDL goal < 70) -Controlled - LDL 58 -Current treatment:  Atorvastatin 40 mg - 1 tablet daily  Zetia 10 mg - 1 tablet daily -Medications previously tried: none -Educated on Cholesterol goals;  -Recommended to continue current medication  Atrial Fibrillation (Goal: prevent stroke and major bleeding) -Controlled - per cardio visit 04/22/20  -History of DVT RLE -Current treatment: . Rate control: Metoprolol tartrate 25 mg - 1 tablet twice daily  . Anticoagulation: Warfarin 5 mg - 1 tablet daily (current dose per patient 05/20/20) -Medications previously tried: none - declines interest in NOAC -Patient denies any recent changes in warfarin dose, INR has been stable. Following every 6 weeks. -Denies abnormal bruising or bleeding -Counseled on bleeding risk associated with warfarin and importance of self-monitoring for signs/symptoms of bleeding; avoidance of NSAIDs due to increased bleeding risk with anticoagulants; -Recommended to continue current medication  Depression/Anxiety (Goal: improve mood) -Controlled - per patient medications are working well  -Current treatment: . Xanax 0.5 mg  XR - 1 tablet daily . Bupropion 150 mg - 1 tablet daily  -Medications previously tried/failed: none reported -Patient reports less interest in usual activities since wife passed in February 2022 -He has good family support  -Recommended to continue current medication  Patient Goals/Self-Care Activities . Patient will:  - focus on medication adherence by continuing to use a pillbox  Follow Up Plan: Telephone follow up appointment with care management team member scheduled for:  12 months      Medication Assistance: None required.  Patient affirms current coverage meets needs.  Patient's preferred pharmacy is:  [place], [place] - [address] [place] [address] Phone: [phone] Fax: [phone]  Uses pill box? Yes - fills one  month at a time, uses auto refill  Pt endorses 100% compliance  Care Plan and Follow Up Patient Decision:  Patient agrees to Care Plan and Follow-up.   Debbora Dus, PharmD Clinical Pharmacist [facility] at [facility] [phone]

## 2020-05-21 NOTE — Progress Notes (Signed)
Encounter details: CCM Time Spent       Value Time User   Time spent with patient (minutes)  30 05/20/2020 10:29 AM Debbora Dus, RPH   Time spent performing Chart review  30 05/20/2020 10:29 AM Debbora Dus, [facility]   Total time (minutes)  60 05/20/2020 10:29 AM Debbora Dus, RPH      Moderate to High Complex Decision Making       Value Time User   Moderate to High complex decision making  Yes 05/20/2020 10:29 AM Debbora Dus, [facility]      CCM Services: This encounter meets complex CCM services and moderate to high decision making.  Prior to outreach and patient consent for Chronic Care Management, I referred this patient for services after reviewing the nominated patient list or from a personal encounter with the patient.  I have personally reviewed this encounter including the documentation in this note and have collaborated with the care management provider regarding care management and care coordination activities to include development and update of the comprehensive care plan. I am certifying that I agree with the content of this note and encounter as supervising physician.

## 2020-05-27 ENCOUNTER — Other Ambulatory Visit: Payer: Self-pay

## 2020-05-27 ENCOUNTER — Ambulatory Visit (INDEPENDENT_AMBULATORY_CARE_PROVIDER_SITE_OTHER): Payer: Medicare Other

## 2020-05-27 DIAGNOSIS — Z86718 Personal history of other venous thrombosis and embolism: Secondary | ICD-10-CM | POA: Diagnosis not present

## 2020-05-27 DIAGNOSIS — Z5181 Encounter for therapeutic drug level monitoring: Secondary | ICD-10-CM | POA: Diagnosis not present

## 2020-05-27 LAB — POCT INR: INR: 3.3 — AB (ref 2.0–3.0)

## 2020-05-27 NOTE — Patient Instructions (Signed)
-,  take 1/2 tablet tonight, then  - continue dosage of 1 tablet warfarin daily - recheck in 6 weeks.

## 2020-05-28 NOTE — Chronic Care Management (AMB) (Signed)
Entered in error

## 2020-06-08 ENCOUNTER — Other Ambulatory Visit: Payer: Self-pay | Admitting: Cardiovascular Disease

## 2020-06-09 NOTE — Progress Notes (Signed)
CCM care plan from Anthony FOLLETT Jr.'s appointment on 05/20/20 with Debbora Dus, Pharm. D, was mailed to him per his request.    Debbora Dus, CPP notified  Margaretmary Dys, [facility] Pharmacy Assistant [phone]

## 2020-07-01 DIAGNOSIS — H903 Sensorineural hearing loss, bilateral: Secondary | ICD-10-CM | POA: Diagnosis not present

## 2020-07-01 DIAGNOSIS — H6123 Impacted cerumen, bilateral: Secondary | ICD-10-CM | POA: Diagnosis not present

## 2020-07-08 ENCOUNTER — Ambulatory Visit (INDEPENDENT_AMBULATORY_CARE_PROVIDER_SITE_OTHER): Payer: Medicare Other

## 2020-07-08 ENCOUNTER — Other Ambulatory Visit: Payer: Self-pay

## 2020-07-08 DIAGNOSIS — Z86718 Personal history of other venous thrombosis and embolism: Secondary | ICD-10-CM

## 2020-07-08 DIAGNOSIS — Z5181 Encounter for therapeutic drug level monitoring: Secondary | ICD-10-CM

## 2020-07-08 LAB — POCT INR: INR: 3.4 — AB (ref 2.0–3.0)

## 2020-07-08 NOTE — Patient Instructions (Signed)
-   skip warfarin tonight, then  - continue dosage of 1 tablet warfarin daily - recheck in 6 weeks.   Try to get your greens back on a schedule

## 2020-08-19 ENCOUNTER — Telehealth: Payer: Self-pay | Admitting: Cardiovascular Disease

## 2020-08-19 ENCOUNTER — Other Ambulatory Visit: Payer: Self-pay

## 2020-08-19 ENCOUNTER — Ambulatory Visit (INDEPENDENT_AMBULATORY_CARE_PROVIDER_SITE_OTHER): Payer: Medicare Other

## 2020-08-19 DIAGNOSIS — Z5181 Encounter for therapeutic drug level monitoring: Secondary | ICD-10-CM

## 2020-08-19 DIAGNOSIS — Z86718 Personal history of other venous thrombosis and embolism: Secondary | ICD-10-CM | POA: Diagnosis not present

## 2020-08-19 LAB — POCT INR: INR: 3.3 — AB (ref 2.0–3.0)

## 2020-08-19 NOTE — Telephone Encounter (Signed)
   [facility] Pre-operative Risk Assessment    Patient Name: Anthony Kim.  DOB: 02-11-1945 MRN: [id number]  HEARTCARE STAFF:  - IMPORTANT!!!!!! Under Visit Info/Reason for Call, type in Other and utilize the format Clearance MM/DD/YY or Clearance TBD. Do not use dashes or single digits. - Please review there is not already an duplicate clearance open for this procedure. - If request is for dental extraction, please clarify the # of teeth to be extracted. - If the patient is currently at the dentist's office, call Pre-Op Callback Staff (MA/nurse) to input urgent request.  - If the patient is not currently in the dentist office, please route to the Pre-Op pool.  Request for surgical clearance:  What type of surgery is being performed? Single implant placement   When is this surgery scheduled? TBD   What type of clearance is required (medical clearance vs. Pharmacy clearance to hold med vs. Both)? both  Are there any medications that need to be held prior to surgery and how long? Blood thinner instructions   Practice name and name of physician performing surgery? [facility]   What is the office phone number? [phone]   7.   What is the office fax number? [phone]  8.   Anesthesia type (None, local, MAC, general) ? Not listed    Ace Gins 08/19/2020, 2:56 PM  _________________________________________________________________   (provider comments below)

## 2020-08-19 NOTE — Telephone Encounter (Signed)
Clinical pharmacist to review Coumadin.  Patient has a history of atrial fibrillation and right lower extremity DVT.

## 2020-08-19 NOTE — Patient Instructions (Signed)
-   skip warfarin tonight, then  - START NEW dosage of 1 tablet warfarin daily EXCEPT 1/2 TABLET ON MONDAYS - recheck in 4 weeks  Try to get your greens back on a schedule

## 2020-08-21 NOTE — Telephone Encounter (Signed)
Patient with diagnosis of atrial fibrillation on warfarin for anticoagulation.    Procedure: single implant placement Date of procedure: TBD   CHA2DS2-VASc Score = 4  This indicates a 4.8% annual risk of stroke. 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  CrCl 77 (using adjusted body weight) Platelet count 129  Patient does not require pre-op antibiotics for dental procedure.  Patient should have INR at low end of normal for procedure.  Would recommend INR check 1-2 days prior to procedure and if necessary, hold 1-2 doses to keep INR in 1.9-2.2 range on procedure date.    Patient will not need bridging with Lovenox (enoxaparin) around procedure.  Patient should restart warfarin on the evening of procedure or day after, at discretion of procedure MD  Patient has INR followed by [facility] office.  Message has been sent to [facility] RN to make her aware.  Facility should notify her at the [place] office when date is confirmed.  Please note Coumadin clinic only open on Wednesdays in [place], patient may need to come to [place] office depending on procedure date.

## 2020-08-21 NOTE — Telephone Encounter (Signed)
    Patient Name: Anthony Kim.  DOB: 11-23-1945 MRN: [id number]  Primary Cardiologist: Ida Rogue, MD  Chart reviewed as part of pre-operative protocol coverage. Given past medical history and time since last visit, based on ACC/AHA guidelines, Yurem Smutny. would be at acceptable risk for the planned procedure without further cardiovascular testing.   Patient with diagnosis of atrial fibrillation on warfarin for anticoagulation.     Procedure: single implant placement Date of procedure: TBD   CHA2DS2-VASc Score = 4  This indicates a 4.8% annual risk of stroke. 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   CrCl 77 (using adjusted body weight) Platelet count 129   Patient does not require pre-op antibiotics for dental procedure.   Patient should have INR at low end of normal for procedure.  Would recommend INR check 1-2 days prior to procedure and if necessary, hold 1-2 doses to keep INR in 1.9-2.2 range on procedure date.     Patient will not need bridging with Lovenox (enoxaparin) around procedure.   Patient should restart warfarin on the evening of procedure or day after, at discretion of procedure MD  Patient has INR followed by [facility] office.  Message has been sent to [facility] RN to make her aware.  Facility should notify her at the [place] office when date is confirmed.  Please note Coumadin clinic only open on Wednesdays in [place], patient may need to come to [place] office depending on procedure date.  The patient was advised that if he develops new symptoms prior to surgery to contact our office to arrange for a follow-up visit, and he verbalized understanding.  I will route this recommendation to the  requesting party via Epic fax function and remove from pre-op pool.  Please call with questions.  Kathyrn Drown, NP 08/21/2020, 8:02 AM

## 2020-08-26 ENCOUNTER — Other Ambulatory Visit: Payer: Self-pay

## 2020-08-26 ENCOUNTER — Ambulatory Visit (INDEPENDENT_AMBULATORY_CARE_PROVIDER_SITE_OTHER): Payer: Medicare Other

## 2020-08-26 ENCOUNTER — Telehealth: Payer: Self-pay | Admitting: Cardiovascular Disease

## 2020-08-26 DIAGNOSIS — Z86718 Personal history of other venous thrombosis and embolism: Secondary | ICD-10-CM

## 2020-08-26 DIAGNOSIS — Z5181 Encounter for therapeutic drug level monitoring: Secondary | ICD-10-CM | POA: Diagnosis not present

## 2020-08-26 LAB — POCT INR: INR: 2 (ref 2.0–3.0)

## 2020-08-26 NOTE — Patient Instructions (Signed)
-   continue dosage of 1 tablet warfarin daily EXCEPT 1/2 TABLET ON MONDAYS - recheck in 4 weeks  Try to get your greens back on a schedule

## 2020-08-26 NOTE — Progress Notes (Signed)
Patient does not require pre-op antibiotics for dental procedure.   Patient should have INR at low end of normal for procedure.  Would recommend INR check 1-2 days prior to procedure and if necessary, hold 1-2 doses to keep INR in 1.9-2.2 range on procedure date.     Patient will not need bridging with Lovenox (enoxaparin) around procedure.   Patient should restart warfarin on the evening of procedure or day after, at discretion of procedure MD

## 2020-08-26 NOTE — Telephone Encounter (Signed)
Please call to discuss Warfarin. States she needs clarification.

## 2020-08-26 NOTE — Telephone Encounter (Signed)
Spoke w/ Junie Panning.  Pt is sched for dental procedure on 8/18.  He was seen today for INR check and his result was 2.0. Per pt's clearance 08/19/20: Patient does not require pre-op antibiotics for dental procedure.   Patient should have INR at low end of normal for procedure.  Would recommend INR check 1-2 days prior to procedure and if necessary, hold 1-2 doses to keep INR in 1.9-2.2 range on procedure date.     Patient will not need bridging with Lovenox (enoxaparin) around procedure.   Patient should restart warfarin on the evening of procedure or day after, at discretion of procedure MD  She would like clarification on whether pt should hold warfarin prior to procedure, as his instructions given today do not say to do so.  Advised her that the clearance states "if necessary, hold 1-2 dosage to keep INR in 1.9-2.2 range" Advised that I can only adjust his dosage based on today's reading.  Ideally, pt should have his INR checked on Friday, but he refuses to go to another office to have it checked ([place] anti-coag is only open on Wednesdays). If he stays on his current regimen, his INR should remain stable and be in the requested range for his procedure on Monday. She verbalizes understanding and may have Dr. Gloris Manchester call back if he has any questions.  Advised her that I will be happy to speak to him if I need to.

## 2020-09-04 ENCOUNTER — Other Ambulatory Visit: Payer: Self-pay | Admitting: Cardiovascular Disease

## 2020-09-04 ENCOUNTER — Other Ambulatory Visit: Payer: Self-pay | Admitting: Family Medicine

## 2020-09-07 ENCOUNTER — Other Ambulatory Visit: Payer: Self-pay | Admitting: Family Medicine

## 2020-09-07 NOTE — Telephone Encounter (Signed)
Last filled 08-11-20 #30 (Rx was written 05-07-20 #30/3) Last OV 03-11-20  No future OV [employer]

## 2020-09-08 NOTE — Telephone Encounter (Signed)
ERx 

## 2020-09-23 ENCOUNTER — Other Ambulatory Visit: Payer: Self-pay

## 2020-09-23 ENCOUNTER — Ambulatory Visit (INDEPENDENT_AMBULATORY_CARE_PROVIDER_SITE_OTHER): Payer: Medicare Other

## 2020-09-23 DIAGNOSIS — Z5181 Encounter for therapeutic drug level monitoring: Secondary | ICD-10-CM

## 2020-09-23 DIAGNOSIS — Z86718 Personal history of other venous thrombosis and embolism: Secondary | ICD-10-CM

## 2020-09-23 LAB — POCT INR: INR: 2.5 (ref 2.0–3.0)

## 2020-09-23 NOTE — Patient Instructions (Signed)
-   continue dosage of 1 tablet warfarin daily EXCEPT 1/2 TABLET ON MONDAYS - recheck in 5 weeks  Try to get your greens back on a schedule

## 2020-09-28 ENCOUNTER — Telehealth: Payer: Self-pay

## 2020-09-28 NOTE — Chronic Care Management (AMB) (Addendum)
Chronic Care Management Pharmacy Assistant   Name: Anthony Kim.  MRN: [id number] DOB: 1945-04-07  Reason for Encounter: Adherence Review   Recent office visits:  None since last CCM contact  Recent consult visits:  07/01/20  -Otolaryngology - no data found  Hospital visits:  None in previous 6 months  Medications: Outpatient Encounter Medications as of 09/28/2020  Medication Sig   acetaminophen (TYLENOL) 325 MG tablet Take 650 mg by mouth every 6 (six) hours as needed for pain. Takes 650 mg once as needed, sparingly   ALLERGY RELIEF 180 MG tablet TAKE ONE TABLET DAILY FOR CONGESTION.   ALPRAZolam (XANAX XR) 0.5 MG 24 hr tablet Take 1 tablet (0.5 mg total) by mouth daily.   atorvastatin (LIPITOR) 40 MG tablet Take 1 tablet (40 mg total) by mouth daily at 6 PM.   buPROPion (WELLBUTRIN XL) 150 MG 24 hr tablet Take 1 tablet (150 mg total) by mouth daily.   cloNIDine (CATAPRES) 0.1 MG tablet Take 1 tablet (0.1 mg total) by mouth 2 (two) times daily.   ezetimibe (ZETIA) 10 MG tablet Take 1 tablet (10 mg total) by mouth daily.   meclizine (ANTIVERT) 25 MG tablet Take 25 mg by mouth 3 (three) times daily as needed for dizziness. Reported on 07/01/2015   metoprolol tartrate (LOPRESSOR) 25 MG tablet Take 1 tablet (25 mg total) by mouth 2 (two) times daily.   Multiple Vitamin (MULTIVITAMIN PO) Take 1 tablet by mouth daily. pm   omeprazole (PRILOSEC) 40 MG capsule Take 1 capsule (40 mg total) by mouth daily.   warfarin (COUMADIN) 5 MG tablet Take up to 1.5 tablets per day by mouth as directed by the coumadin clinic.   No facility-administered encounter medications on file as of 09/28/2020.    [place]. on 09/28/20 for general disease state and medication adherence call.   Patient is not > 5 days past due for refill on the following medications per chart history:  Star Medications: Medication Name/mg Last Fill Days Supply Atorvastatin '40mg'$   09/07/20 90  What  concerns do you have about your medications? No  concerns at this time   The patient denies side effects with his medications.   How often do you forget or accidentally miss a dose? Never  Do you use a pillbox? Yes organizes weekly   Are you having any problems getting your medications from your pharmacy? No  [place] has been good for the patient   Has the cost of your medications been a concern? No  Since last visit with CPP, no interventions have been made.  The patient has not had an ED visit since last contact.   The patient denies problems with their health.   he denies concerns or questions for Debbora Dus, PharmD at this time.   Counseled patient on:  [place] job taking medications, Importance of taking medication daily without missed doses, Benefits of adherence packaging or a pillbox, and Access to CCM team for any cost, medication or pharmacy concerns.   Care Gaps: Annual wellness visit in last year? 03/11/20 Most Recent BP reading: 138/90  81-P (03/11/20)   No appointments scheduled within the next 30 days.   Debbora Dus, CPP notified  Avel Sensor, [place] Assistant [phone]  I have reviewed the care management and care coordination activities outlined in this encounter and I am certifying that I agree with the content of this note. No further action required.  Debbora Dus, PharmD Clinical Pharmacist [facility] at [facility] [phone]

## 2020-10-05 ENCOUNTER — Other Ambulatory Visit: Payer: Self-pay | Admitting: Family Medicine

## 2020-10-05 NOTE — Telephone Encounter (Signed)
Refill request Alprazolam Last refill 09/08/20 #30 Last office visit 05/20/20 No upcoming appointment scheduled

## 2020-10-06 NOTE — Telephone Encounter (Signed)
ERx 

## 2020-10-28 ENCOUNTER — Ambulatory Visit (INDEPENDENT_AMBULATORY_CARE_PROVIDER_SITE_OTHER): Payer: Medicare Other

## 2020-10-28 ENCOUNTER — Other Ambulatory Visit: Payer: Self-pay

## 2020-10-28 DIAGNOSIS — Z5181 Encounter for therapeutic drug level monitoring: Secondary | ICD-10-CM

## 2020-10-28 DIAGNOSIS — Z86718 Personal history of other venous thrombosis and embolism: Secondary | ICD-10-CM

## 2020-10-28 LAB — POCT INR: INR: 1.5 — AB (ref 2.0–3.0)

## 2020-10-28 NOTE — Patient Instructions (Signed)
-   take 1.5 tablets warfarin tonight, then  - continue dosage of 1 tablet warfarin daily EXCEPT 1/2 TABLET ON MONDAYS - recheck in 4 weeks

## 2020-11-02 ENCOUNTER — Other Ambulatory Visit: Payer: Self-pay | Admitting: Cardiovascular Disease

## 2020-11-02 NOTE — Telephone Encounter (Signed)
Refill request

## 2020-11-02 NOTE — Telephone Encounter (Signed)
Last OV with Dr Rockey Situ 04/22/20 Last INR check 10/28/20 Warfarin refill approved.

## 2020-12-02 ENCOUNTER — Ambulatory Visit (INDEPENDENT_AMBULATORY_CARE_PROVIDER_SITE_OTHER): Payer: Medicare Other

## 2020-12-02 ENCOUNTER — Telehealth: Payer: Self-pay | Admitting: Family Medicine

## 2020-12-02 ENCOUNTER — Other Ambulatory Visit: Payer: Self-pay

## 2020-12-02 DIAGNOSIS — Z86718 Personal history of other venous thrombosis and embolism: Secondary | ICD-10-CM | POA: Diagnosis not present

## 2020-12-02 DIAGNOSIS — Z5181 Encounter for therapeutic drug level monitoring: Secondary | ICD-10-CM

## 2020-12-02 DIAGNOSIS — Z23 Encounter for immunization: Secondary | ICD-10-CM | POA: Diagnosis not present

## 2020-12-02 LAB — POCT INR: INR: 2.4 (ref 2.0–3.0)

## 2020-12-02 NOTE — Telephone Encounter (Signed)
Abigail Butts calling from pharmacy and she wants to know what pneumonia shot and type did the pt have last year. [phone]

## 2020-12-02 NOTE — Patient Instructions (Signed)
-   continue dosage of 1 tablet warfarin daily EXCEPT 1/2 TABLET ON MONDAYS - recheck in 5 weeks  Try to get your greens back on a schedule

## 2020-12-03 NOTE — Telephone Encounter (Signed)
Spoke with Abigail Butts notifying her pt did not receive a pneumonia shot with [place] last yr.  Verbalizes understanding.  Had Pneumovax-23 03/23/2011 and Prevnar-13 11/13/13.

## 2020-12-07 ENCOUNTER — Other Ambulatory Visit: Payer: Self-pay | Admitting: Cardiovascular Disease

## 2020-12-24 ENCOUNTER — Telehealth: Payer: Self-pay

## 2020-12-24 NOTE — Chronic Care Management (AMB) (Addendum)
Chronic Care Management Pharmacy Assistant   Name: Anthony Kim.  MRN: [id number] DOB: 1945/03/03  Reason for Encounter: Hypertension Disease State  Recent office visits:  None since last CCM contact  Recent consult visits:  12/02/20- Cardiology- Warfarin INR monitoring   Hospital visits:  None in previous 6 months  Medications: Outpatient Encounter Medications as of 12/24/2020  Medication Sig   acetaminophen (TYLENOL) 325 MG tablet Take 650 mg by mouth every 6 (six) hours as needed for pain. Takes 650 mg once as needed, sparingly   ALLERGY RELIEF 180 MG tablet TAKE ONE TABLET DAILY FOR CONGESTION.   ALPRAZolam (XANAX XR) 0.5 MG 24 hr tablet Take 1 tablet (0.5 mg total) by mouth daily.   atorvastatin (LIPITOR) 40 MG tablet Take 1 tablet (40 mg total) by mouth daily at 6 PM.   buPROPion (WELLBUTRIN XL) 150 MG 24 hr tablet Take 1 tablet (150 mg total) by mouth daily.   cloNIDine (CATAPRES) 0.1 MG tablet Take 1 tablet (0.1 mg total) by mouth 2 (two) times daily.   ezetimibe (ZETIA) 10 MG tablet Take 1 tablet (10 mg total) by mouth daily.   meclizine (ANTIVERT) 25 MG tablet Take 25 mg by mouth 3 (three) times daily as needed for dizziness. Reported on 07/01/2015   metoprolol tartrate (LOPRESSOR) 25 MG tablet Take 1 tablet (25 mg total) by mouth 2 (two) times daily.   Multiple Vitamin (MULTIVITAMIN PO) Take 1 tablet by mouth daily. pm   omeprazole (PRILOSEC) 40 MG capsule Take 1 capsule (40 mg total) by mouth daily.   warfarin (COUMADIN) 5 MG tablet Take up to 1.5 tablets per day by mouth as directed by the coumadin clinic.   No facility-administered encounter medications on file as of 12/24/2020.    Recent Office Vitals: BP Readings from Last 3 Encounters:  04/22/20 140/90  03/11/20 138/90  03/05/20 129/77   Pulse Readings from Last 3 Encounters:  04/22/20 71  03/11/20 81  03/05/20 73    Wt Readings from Last 3 Encounters:  04/22/20 220 lb (99.8 kg)  03/11/20 220 lb  1 oz (99.8 kg)  04/16/19 224 lb (101.6 kg)     Kidney Function Lab Results  Component Value Date/Time   CREATININE 0.98 03/11/2020 09:12 AM   CREATININE 1.03 10/18/2018 01:07 PM   GFR 75.33 03/11/2020 09:12 AM   GFRNONAA >60 09/16/2018 12:03 PM   GFRAA >60 09/16/2018 12:03 PM    BMP Latest Ref Rng & Units 03/11/2020 10/18/2018 09/16/2018  Glucose 70 - 99 mg/dL 100(H) 97 117(H)  BUN 6 - 23 mg/dL 16 25(H) 16  Creatinine 0.40 - 1.50 mg/dL 0.98 1.03 1.04  BUN/Creat Ratio 10 - 24 - - -  Sodium 135 - 145 mEq/L 141 142 139  Potassium 3.5 - 5.1 mEq/L 4.8 4.9 4.1  Chloride 96 - 112 mEq/L 103 105 104  CO2 19 - 32 mEq/L 33(H) 30 23  Calcium 8.4 - 10.5 mg/dL 9.2 9.5 9.2     Contacted patient on 12/24/20 to discuss hypertension disease state  Current antihypertensive regimen:  Clonidine 0.1 mg - 1 tablet twice daily Metoprolol tartrate 25 mg - 1 tablet twice daily   Patient verbally confirms he is taking the above medications as directed. Yes  How often are you checking your Blood Pressure? daily  Current home BP readings: DATE:             BP  PULSE 12/24/20 am 112/68  76   pm 110/66  70 12/25/20 am 119/71  62   pm 120/84  67   Wrist or arm cuff: arm cuff Caffeine intake: The patient drinks decaf, no soda, grape juice. Salt intake:limits with adding to foods OTC medications including pseudoephedrine or NSAIDs? The patient will use tylenol due to being on blood thinner  Any readings above 180/120? No  What recent interventions/DTPs have been made by any provider to improve Blood Pressure control since last CPP Visit: Patient reports he is watching what he eats due to limited exercise.  Any recent hospitalizations or ED visits since last visit with CPP? No  What diet changes have been made to improve Blood Pressure Control?     The patient reports small portions meal choices  What exercise is being done to improve your Blood Pressure Control?   No  exercise.  Adherence Review: Is the patient currently on ACE/ARB medication? No Does the patient have >5 day gap between last estimated fill dates? No   Star Rating Drugs:  Medication:  Last Fill: Day Supply Atorvastatin 40mg  12/09/20 90  Care Gaps: Annual wellness visit in last year? Yes Most Recent BP reading:  119/71  62-P home reading 12/25/20  No appointments scheduled within the next 30 days.  Anthony Kim, CPP notified  Anthony Kim, [place] Assistant [phone]  I have reviewed the care management and care coordination activities outlined in this encounter and I am certifying that I agree with the content of this note. No further action required.  Anthony Kim, PharmD Clinical Pharmacist [facility] at [facility] [phone]

## 2020-12-30 DIAGNOSIS — H6123 Impacted cerumen, bilateral: Secondary | ICD-10-CM | POA: Diagnosis not present

## 2020-12-30 DIAGNOSIS — H903 Sensorineural hearing loss, bilateral: Secondary | ICD-10-CM | POA: Diagnosis not present

## 2021-01-06 ENCOUNTER — Ambulatory Visit (INDEPENDENT_AMBULATORY_CARE_PROVIDER_SITE_OTHER): Payer: Medicare Other | Admitting: *Deleted

## 2021-01-06 ENCOUNTER — Other Ambulatory Visit: Payer: Self-pay | Admitting: Cardiovascular Disease

## 2021-01-06 ENCOUNTER — Other Ambulatory Visit: Payer: Self-pay

## 2021-01-06 DIAGNOSIS — Z5181 Encounter for therapeutic drug level monitoring: Secondary | ICD-10-CM

## 2021-01-06 DIAGNOSIS — Z86718 Personal history of other venous thrombosis and embolism: Secondary | ICD-10-CM | POA: Diagnosis not present

## 2021-01-06 LAB — POCT INR: INR: 2.2 (ref 2.0–3.0)

## 2021-01-06 NOTE — Patient Instructions (Signed)
Description   - continue dosage of 1 tablet warfarin daily EXCEPT 1/2 TABLET ON MONDAYS - recheck in  6 weeks

## 2021-02-08 ENCOUNTER — Other Ambulatory Visit: Payer: Self-pay | Admitting: Cardiovascular Disease

## 2021-02-08 MED ORDER — EZETIMIBE 10 MG PO TABS: ORAL_TABLET | ORAL | 0 refills | Status: DC

## 2021-02-08 NOTE — Addendum Note (Signed)
Addended by: Ronaldo Miyamoto on: 02/08/2021 01:47 PM   Modules accepted: Orders

## 2021-02-09 ENCOUNTER — Other Ambulatory Visit: Payer: Self-pay | Admitting: Cardiovascular Disease

## 2021-02-12 ENCOUNTER — Other Ambulatory Visit: Payer: Self-pay | Admitting: Family Medicine

## 2021-02-12 NOTE — Telephone Encounter (Signed)
Name of Medication: Alprazolam Name of Pharmacy: [facility] or Written Date and Quantity: 01/08/21, #30 Last Office Visit and Type: 03/11/20, AWV prt 2 Next Office Visit and Type: none Last Controlled Substance Agreement Date: 01/24/17 Last UDS: 01/24/17

## 2021-02-13 NOTE — Telephone Encounter (Signed)
ERx 

## 2021-02-19 ENCOUNTER — Ambulatory Visit (INDEPENDENT_AMBULATORY_CARE_PROVIDER_SITE_OTHER): Payer: Medicare Other | Admitting: Internal Medicine

## 2021-02-19 ENCOUNTER — Encounter: Payer: Self-pay | Admitting: Internal Medicine

## 2021-02-19 ENCOUNTER — Other Ambulatory Visit: Payer: Self-pay

## 2021-02-19 DIAGNOSIS — L03116 Cellulitis of left lower limb: Secondary | ICD-10-CM

## 2021-02-19 HISTORY — DX: Cellulitis of left lower limb: L03.116

## 2021-02-19 MED ORDER — CEFTRIAXONE SODIUM 1 G IJ SOLR
1.0000 g | Freq: Once | INTRAMUSCULAR | Status: AC
  Administered 2021-02-19: 1 g via INTRAMUSCULAR

## 2021-02-19 MED ORDER — CEFUROXIME AXETIL 500 MG PO TABS: 500.0000 mg | ORAL_TABLET | Freq: Two times a day (BID) | ORAL | 0 refills | Status: AC

## 2021-02-19 NOTE — Progress Notes (Signed)
Subjective:    Patient ID: Anthony Conch., male    DOB: 1945-06-14, 76 y.o.   MRN: [id number]  HPI Here due to left leg problems  2 nights ago--after eating, noted pain along shin Then got severe chills and couldn't stop Turned up heat/blankets----but finally improved Yesterday, still felt cold and he noticed some red places on left leg  Some nausea the first night--couldn't vomit Slight diarrhea yesterday  Checked temp--95.4-96 then up to 98.6 97.4 this morning  No injury  Does have some swelling---usually on right (wears compression hose) Sees redness up into thigh now Some pain anterior left calf  Current Outpatient Medications on File Prior to Visit  Medication Sig Dispense Refill   acetaminophen (TYLENOL) 650 MG CR tablet Take 650 mg by mouth 2 (two) times daily.     ALLERGY RELIEF 180 MG tablet TAKE ONE TABLET DAILY FOR CONGESTION. 30 tablet 5   ALPRAZolam (XANAX XR) 0.5 MG 24 hr tablet Take 1 tablet (0.5 mg total) by mouth daily. 30 tablet 3   atorvastatin (LIPITOR) 40 MG tablet Take 1 tablet (40 mg total) by mouth daily at 6 PM. 90 tablet 2   buPROPion (WELLBUTRIN XL) 150 MG 24 hr tablet Take 1 tablet (150 mg total) by mouth daily. 90 tablet 3   cloNIDine (CATAPRES) 0.1 MG tablet Take 1 tablet (0.1 mg total) by mouth 2 (two) times daily. 60 tablet 4   ezetimibe (ZETIA) 10 MG tablet Take 1 tablet (10 mg total) by mouth daily. 90 tablet 0   meclizine (ANTIVERT) 25 MG tablet Take 25 mg by mouth 3 (three) times daily as needed for dizziness. Reported on 07/01/2015     metoprolol tartrate (LOPRESSOR) 25 MG tablet Take 1 tablet (25 mg total) by mouth 2 (two) times daily. PLEASE CALL OFFICE TO SCHEDULE ANNUAL VISIT. 180 tablet 0   Multiple Vitamin (MULTIVITAMIN PO) Take 1 tablet by mouth daily. pm     omeprazole (PRILOSEC) 40 MG capsule Take 1 capsule (40 mg total) by mouth daily. 30 capsule 5   warfarin (COUMADIN) 5 MG tablet Take up to 1.5 tablets per day by mouth as  directed by the coumadin clinic. 40 tablet 5   No current facility-administered medications on file prior to visit.    Allergies  Allergen Reactions   Iodine Hives    Contrast dye/ betadine ok    Past Medical History:  Diagnosis Date   Anxiety    CAD (coronary artery disease)    a. 07/2012 CTA chest - incidental coronary Ca2+ of LAD/LCX; b. 08/2012 Myoview: EF 59%, non ischemia/scar-->Low risk.   Clotting disorder ([place])    Colon polyps    2008 Tubular Adenoma   Cough    chronic   Deep vein thrombosis (DVT) (HCC) 04/06/2010   Depression    DVT (deep venous thrombosis) ([place])    a. RLE DVT ~ 05/2010-->chronic coumadin.   GERD (gastroesophageal reflux disease)    Hyperlipidemia    Hypertension    controlled on meds   IBS (irritable bowel syndrome)    NICM (nonischemic cardiomyopathy) ([place])    a. 06/2016 Echo: EF 45-50%, diff HK, mod dil LA/RA.   Palpitations    Persistent atrial fibrillation ([place])    a. Dx 06/2010 - CHA2DS2VASc = 3-4 (Chronic coumadin); b. 07/2016 s/p DCCV (150J) - initially successful but reverted to Afib.   Pneumonia    3 wks ago/ no hospitalization/ no follow up required/ was on antibiotic and steroids/Dr  [place]   Presence of partial dental prosthetic device    lower right   Seasonal allergies     Past Surgical History:  Procedure Laterality Date   APPENDECTOMY     1967   CARDIOVERSION N/A 08/03/2016   Procedure: CARDIOVERSION;  Surgeon: Minna Merritts, MD;  Location: [facility] ORS;  Service: Cardiovascular;  Laterality: N/A;   CATARACT EXTRACTION W/PHACO Right 07/08/2015   Procedure: CATARACT EXTRACTION PHACO AND INTRAOCULAR LENS PLACEMENT ([place]) RIGHT EYE;  Surgeon: Leandrew Koyanagi, MD;  Location: [place];  Service: Ophthalmology;  Laterality: Right;   SYMFONY LENS   CATARACT EXTRACTION W/PHACO Left 08/05/2015   Procedure: CATARACT EXTRACTION PHACO AND INTRAOCULAR LENS PLACEMENT ([facility]) left eye;  Surgeon: Leandrew Koyanagi, MD;  Location:  [place];  Service: Ophthalmology;  Laterality: Left;  SYMFONY LENS   CATARACT EXTRACTION, BILATERAL Bilateral 08/2015   COLONOSCOPY     x3   POLYPECTOMY     TONSILLECTOMY AND ADENOIDECTOMY      Family History  Problem Relation Age of Onset   Heart disease Mother    Hypertension Mother    Irritable bowel syndrome Mother    Heart attack Father    Hypertension Father    Prostate cancer Father     Social History   Socioeconomic History   Marital status: Married    Spouse name: Not on file   Number of children: 1   Years of education: Not on file   Highest education level: Not on file  Occupational History   Occupation: Scientist, clinical (histocompatibility and immunogenetics): [employer]  Tobacco Use   Smoking status: Former    Packs/day: 1.00    Years: 30.00    Pack years: 30.00    Types: Cigarettes    Quit date: 01/18/1992    Years since quitting: 29.1   Smokeless tobacco: Never  Vaping Use   Vaping Use: Never used  Substance and Sexual Activity   Alcohol use: Yes    Alcohol/week: 7.0 - 14.0 standard drinks    Types: 7 - 14 Glasses of wine per week    Comment: 1-2 glasses of wine at night    Drug use: No   Sexual activity: Yes  Other Topics Concern   Not on file  Social History Narrative   Has living will.   Desires CPR.   Would not want life support if recovery futile.  Unsure about feeding tube.   Lives with wife [name]   Activity: active on cattle farm   Diet: good water, G2 gatorade, fruits/vegetables daily    Social Determinants of Health   Financial Resource Strain: Low Risk    Difficulty of Paying Living Expenses: Not hard at all  Food Insecurity: No Food Insecurity   Worried About Charity fundraiser in the Last Year: Never true   Arboriculturist in the Last Year: Never true  Transportation Needs: No Transportation Needs   Lack of Transportation (Medical): No   Lack of Transportation (Non-Medical): No  Physical Activity: Inactive   Days of Exercise per Week: 0  days   Minutes of Exercise per Session: 0 min  Stress: No Stress Concern Present   Feeling of Stress : Not at all  Social Connections: Not on file  Intimate Partner Violence: Not At Risk   Fear of Current or Ex-Partner: No   Emotionally Abused: No   Physically Abused: No   Sexually Abused: No   Review of Systems Appetite off--able to  eat some cereal/yogurt/fruit Not sleeping well No chest pain  No SOB Has atrial fib--on coumadin     Objective:   Physical Exam Constitutional:      Appearance: Normal appearance.  Cardiovascular:     Rate and Rhythm: Normal rate. Rhythm irregular.     Heart sounds: No murmur heard.   No gallop.  Musculoskeletal:     Comments: No calf swelling or tenderness  Skin:    Comments: Mild warmth and scattered redness on anterior left calf Significant confluent area of redness, warmth and tenderness across entire anterior thigh (right below groin) No inguinal nodes No obvious skin breaks either spot  Neurological:     Mental Status: He is alert.           Assessment & Plan:

## 2021-02-19 NOTE — Addendum Note (Signed)
Addended by: Pilar Grammes on: 02/19/2021 11:48 AM   Modules accepted: Orders

## 2021-02-19 NOTE — Assessment & Plan Note (Addendum)
Mild symptoms in lower calf but the upper thigh is concerning (large very warm area) Will treat with ceftriaxone 1gm IM here Cefuroxime 500 bid (7 days) If more chills or progression of redness, should go to ER

## 2021-02-24 ENCOUNTER — Ambulatory Visit (INDEPENDENT_AMBULATORY_CARE_PROVIDER_SITE_OTHER): Payer: Medicare Other

## 2021-02-24 ENCOUNTER — Other Ambulatory Visit: Payer: Self-pay

## 2021-02-24 DIAGNOSIS — Z5181 Encounter for therapeutic drug level monitoring: Secondary | ICD-10-CM

## 2021-02-24 DIAGNOSIS — Z86718 Personal history of other venous thrombosis and embolism: Secondary | ICD-10-CM

## 2021-02-24 LAB — POCT INR: INR: 2 (ref 2.0–3.0)

## 2021-02-24 NOTE — Patient Instructions (Signed)
-   try not to have any greens for the next day or 2  - continue dosage of 1 tablet warfarin daily EXCEPT 1/2 TABLET ON MONDAYS - recheck in  6 weeks

## 2021-03-05 ENCOUNTER — Telehealth: Payer: Self-pay

## 2021-03-05 NOTE — Telephone Encounter (Signed)
I spoke with pt and he said that after taking the abx the first of Feb pt said the swelling redness and pain was completely gone. Pt said lt leg started with swelling and hurting again recently. Pt said has pain and swelling in lt shin and support hose seem to make his leg worse. Pt does not see any redness and leg does not feel warm. Pt said swelling does go down overnight but by the next evening the swelling and pain is very pronouced before going to bed.  Pt has not had any fever and pt has cked BP which was normal but pt cannot remember the actual BP readings. Pt said he does not have any energy and "just does not feel good." No CP or SOB. Pt already has appt with Dr Silvio Pate on 03/08/21 at 2 PM. No available appts at [facility] today and pt does not want to wait at [facility] or ED. I scheduled pt an appt at [facility] in [place] on 03/06/21 at 9 AM. Pt will wait to see if needs to see Dr Silvio Pate on  03/08/21. If pt does not need appt on 03/08/21 pt will call 8 AM on Mon to cancel appt. UC & ED precautions given and pt voiced understanding.  Sending note to Dr Silvio Pate who is out of office today and Dr Glori Bickers who is in office and [place] CMA.

## 2021-03-05 NOTE — Telephone Encounter (Signed)
Aware, thanks  Agree with plan and ER precautions

## 2021-03-05 NOTE — Telephone Encounter (Signed)
[place] [facility] - Client TELEPHONE ADVICE RECORD [facility] Patient Name: Anthony Kim Gender: Male DOB: [birth year] Age: 76 Y 78 M 2 D Return Phone Number: [phone] (Primary) Address: City/ State/ Zip:  Client [place] [facility] - Client Client Site [place] - Day Provider [facility] - PHYSICIAN, Verita Schneiders- MD Contact Type Call Who Is Calling Patient / Member / Family / Caregiver Call Type Triage / Clinical Relationship To Patient Self Return Phone Number [phone] (Primary) Chief Complaint Leg Pain Reason for Call Symptomatic / Request for [place] states is Caryl Pina - no appt avail for today - possible have MRSA in his legs, cellulitis a couple of wks ago and believes that it is coming back. Cellulitis on his left leg on his thigh down to his ankle, injection and antibiotic for 7 days. Redness has cleared up but above the ankle is swollen along with pain in his shin. Translation No Nurse Assessment Nurse: Glean Salvo, RN, Magda Paganini Date/Time Eilene Ghazi Time): 03/05/2021 3:16:21 PM Confirm and document reason for call. If symptomatic, describe symptoms. ---Caller states that he is having pain to his shin, it is still hurting since he had MRSA infection a few weeks ago. Is also having swelling on his shin. Does the patient have any new or worsening symptoms? ---Yes Will a triage be completed? ---Yes Related visit to physician within the last 2 weeks? ---Yes Does the PT have any chronic conditions? (i.e. diabetes, asthma, this includes High risk factors for pregnancy, etc.) ---Yes List chronic conditions. ---hx dvt, a fib, Is this a behavioral health or substance abuse call? ---No Guidelines Guideline Title Affirmed Question Affirmed Notes Nurse Date/Time Eilene Ghazi Time) Leg Swelling and Edema Looks like a boil, infected sore, deep ulcer or other infected rash  (spreading redness, pus) Rebecca Eaton 03/05/2021 3:19:13 PM PLEASE NOTE: All timestamps contained within this report are represented as [place] Standard Time. CONFIDENTIALTY NOTICE: This fax transmission is intended only for the addressee. It contains information that is legally privileged, confidential or otherwise protected from use or disclosure. If you are not the intended recipient, you are strictly prohibited from reviewing, disclosing, copying using or disseminating any of this information or taking any action in reliance on or regarding this information. If you have received this fax in error, please notify us immediately by telephone so that we can arrange for its return to [place]. Phone: [phone], Toll-Free: [phone], Fax: [phone] Page: 2 of 2 Call Id: [id number] [place]. Time Eilene Ghazi Time) Disposition Final User 03/05/2021 3:27:34 PM See PCP within 24 Hours Yes Glean Salvo, RN, Christa See Disagree/Comply Comply Caller Understands Yes PreDisposition Call Doctor Care Advice Given Per Guideline SEE PCP WITHIN 24 HOURS: * IF OFFICE WILL BE OPEN: You need to be examined within the next 24 hours. Call your doctor (or NP/PA) when the office opens and make an appointment. CARE ADVICE given per Leg Swelling and Edema (Adult) guideline. CALL BACK IF: * Fever occurs * You become worse Referrals REFERRED TO PCP OFFIC

## 2021-03-06 ENCOUNTER — Other Ambulatory Visit: Payer: Self-pay

## 2021-03-06 ENCOUNTER — Ambulatory Visit
Admission: RE | Admit: 2021-03-06 | Discharge: 2021-03-06 | Disposition: A | Discharge status: Home / Self Care | Payer: Medicare Other | Source: Ambulatory Visit | Attending: Family Medicine | Admitting: Family Medicine

## 2021-03-06 VITALS — BP 149/99 | HR 75 | Temp 98.1°F | Resp 16

## 2021-03-06 DIAGNOSIS — M79662 Pain in left lower leg: Secondary | ICD-10-CM

## 2021-03-06 DIAGNOSIS — I83893 Varicose veins of bilateral lower extremities with other complications: Secondary | ICD-10-CM | POA: Diagnosis not present

## 2021-03-06 DIAGNOSIS — M7989 Other specified soft tissue disorders: Secondary | ICD-10-CM

## 2021-03-06 NOTE — ED Provider Notes (Signed)
[facility]    CSN: [id number] Arrival date & time: 03/06/21  0900      History   Chief Complaint Chief Complaint  Patient presents with   Leg Pain    HPI Anthony Kim. is a 76 y.o. male.   HPI Patient presents today with localized lower left leg pain and swelling.  Patient reports this has been ongoing since he was treated for cellulitis the early part of February.  He has no redness or ulcerations.  Patient also has chronic varicosities involving both lower legs.  He brings with him pictures which show that his legs by the evening time are discolored and swelling.  He reports after rest the color returns back to his normal state and swelling decreases.  He was concerned as he is having localized swelling and has had a history of a DVT in the past.  However patient is managed on chronic warfarin and his last PT/INR was 2.0 which is therapeutic.  He reports he has not been evaluated by vascular specialist.  He is scheduled to see his PCP on Monday. He typically wears his compression socks daily which helps with management of swelling. Past Medical History:  Diagnosis Date   Anxiety    CAD (coronary artery disease)    a. 07/2012 CTA chest - incidental coronary Ca2+ of LAD/LCX; b. 08/2012 Myoview: EF 59%, non ischemia/scar-->Low risk.   Clotting disorder ([place])    Colon polyps    2008 Tubular Adenoma   Cough    chronic   Deep vein thrombosis (DVT) (HCC) 04/06/2010   Depression    DVT (deep venous thrombosis) ([place])    a. RLE DVT ~ 05/2010-->chronic coumadin.   GERD (gastroesophageal reflux disease)    Hyperlipidemia    Hypertension    controlled on meds   IBS (irritable bowel syndrome)    NICM (nonischemic cardiomyopathy) ([place])    a. 06/2016 Echo: EF 45-50%, diff HK, mod dil LA/RA.   Palpitations    Persistent atrial fibrillation ([place])    a. Dx 06/2010 - CHA2DS2VASc = 3-4 (Chronic coumadin); b. 07/2016 s/p DCCV (150J) - initially successful but reverted to Afib.    Pneumonia    3 wks ago/ no hospitalization/ no follow up required/ was on antibiotic and steroids/Dr Edilia Bo   Presence of partial dental prosthetic device    lower right   Seasonal allergies     Patient Active Problem List   Diagnosis Date Noted   Chronic venous insufficiency 03/08/2021   Cellulitis of left leg 02/19/2021   Caregiver role strain 12/21/2018   Hearing loss due to cerumen impaction, right 10/18/2018   Abdominal aortic atherosclerosis ([place]) 09/17/2018   AAA (abdominal aortic aneurysm) without rupture 09/17/2018   Mass of pancreas 09/17/2018   Advanced care planning/counseling discussion 06/29/2017   Long-term use of high-risk medication 01/28/2017   Persistent atrial fibrillation ([place]) 12/10/2016   Obesity, Class I, BMI 30.0-34.9 (see actual BMI) 09/09/2014   Renal insufficiency 06/23/2014   Encounter for therapeutic drug monitoring 03/20/2013   Sleep apnea 09/05/2012   Allergic rhinitis 03/22/2012   Anxiety 02/04/2011   Long term current use of anticoagulant 04/06/2010   History of DVT of lower extremity 07/18/2008   SKIN CANCER, HX OF 10/30/2007   ERECTILE DYSFUNCTION 04/30/2007   COLONIC POLYPS 2/08 07/12/2006   HYPERCHOLESTEROLEMIA 04/20/2006   MDD (major depressive disorder), recurrent episode, moderate ([place]) 04/20/2006   Essential hypertension 04/20/2006    Past Surgical History:  Procedure Laterality  Date   APPENDECTOMY     1967   CARDIOVERSION N/A 08/03/2016   Procedure: CARDIOVERSION;  Surgeon: Minna Merritts, MD;  Location: [facility]S;  Service: Cardiovascular;  Laterality: N/A;   CATARACT EXTRACTION W/PHACO Right 07/08/2015   Procedure: CATARACT EXTRACTION PHACO AND INTRAOCULAR LENS PLACEMENT ([place]) RIGHT EYE;  Surgeon: Leandrew Koyanagi, MD;  Location: [place];  Service: Ophthalmology;  Laterality: Right;   SYMFONY LENS   CATARACT EXTRACTION W/PHACO Left 08/05/2015   Procedure: CATARACT EXTRACTION PHACO AND INTRAOCULAR LENS PLACEMENT  (IOC) left eye;  Surgeon: Leandrew Koyanagi, MD;  Location: [place];  Service: Ophthalmology;  Laterality: Left;  SYMFONY LENS   CATARACT EXTRACTION, BILATERAL Bilateral 08/2015   COLONOSCOPY     x3   POLYPECTOMY     TONSILLECTOMY AND ADENOIDECTOMY         Home Medications    Prior to Admission medications   Medication Sig Start Date End Date Taking? Authorizing Provider  acetaminophen (TYLENOL) 650 MG CR tablet Take 650 mg by mouth 2 (two) times daily.    [provider]  ALLERGY RELIEF 180 MG tablet TAKE ONE TABLET DAILY FOR CONGESTION. 09/04/20   Ria Bush, MD  ALPRAZolam (XANAX XR) 0.5 MG 24 hr tablet Take 1 tablet (0.5 mg total) by mouth daily. 02/13/21   Ria Bush, MD  atorvastatin (LIPITOR) 40 MG tablet Take 1 tablet (40 mg total) by mouth daily at 6 PM. 06/08/20   Gollan, Kathlene November, MD  buPROPion (WELLBUTRIN XL) 150 MG 24 hr tablet Take 1 tablet (150 mg total) by mouth daily. 03/11/20   Ria Bush, MD  cloNIDine (CATAPRES) 0.1 MG tablet Take 1 tablet (0.1 mg total) by mouth 2 (two) times daily. 01/06/21   Minna Merritts, MD  ezetimibe (ZETIA) 10 MG tablet Take 1 tablet (10 mg total) by mouth daily. 02/09/21   Minna Merritts, MD  furosemide (LASIX) 20 MG tablet Take 1 tablet (20 mg total) by mouth daily as needed. In the morning---for increased leg swelling 03/08/21   Venia Carbon, MD  meclizine (ANTIVERT) 25 MG tablet Take 25 mg by mouth 3 (three) times daily as needed for dizziness. Reported on 07/01/2015    [provider]  metoprolol tartrate (LOPRESSOR) 25 MG tablet Take 1 tablet (25 mg total) by mouth 2 (two) times daily. PLEASE CALL OFFICE TO SCHEDULE ANNUAL VISIT. 02/08/21   Minna Merritts, MD  Multiple Vitamin (MULTIVITAMIN PO) Take 1 tablet by mouth daily. pm    [provider]  omeprazole (PRILOSEC) 40 MG capsule Take 1 capsule (40 mg total) by mouth daily. 09/04/20   Minna Merritts, MD  warfarin  (COUMADIN) 5 MG tablet Take up to 1.5 tablets per day by mouth as directed by the coumadin clinic. 11/02/20   Minna Merritts, MD    Family History Family History  Problem Relation Age of Onset   Heart disease Mother    Hypertension Mother    Irritable bowel syndrome Mother    Heart attack Father    Hypertension Father    Prostate cancer Father     Social History Social History   Tobacco Use   Smoking status: Former    Packs/day: 1.00    Years: 30.00    Pack years: 30.00    Types: Cigarettes    Quit date: 01/18/1992    Years since quitting: 29.1    Passive exposure: Never   Smokeless tobacco: Never  Vaping Use  Vaping Use: Never used  Substance Use Topics   Alcohol use: Yes    Alcohol/week: 7.0 - 14.0 standard drinks    Types: 7 - 14 Glasses of wine per week    Comment: 1-2 glasses of wine at night    Drug use: No     Allergies   Iodine   Review of Systems Review of Systems Pertinent negatives listed in HPI  Physical Exam Triage Vital Signs ED Triage Vitals  Enc Vitals Group     BP 03/06/21 0916 (!) 149/99     Pulse Rate 03/06/21 0916 75     Resp 03/06/21 0916 16     Temp 03/06/21 0916 98.1 F (36.7 C)     Temp Source 03/06/21 0916 Oral     SpO2 03/06/21 0916 96 %     Weight --      Height --      Head Circumference --      Peak Flow --      Pain Score 03/06/21 0920 0     Pain Loc --      Pain Edu? --      Excl. in [place]? --    No data found.  Updated Vital Signs BP (!) 149/99 (BP Location: Right Arm)    Pulse 75    Temp 98.1 F (36.7 C) (Oral)    Resp 16    SpO2 96%   Visual Acuity Right Eye Distance:   Left Eye Distance:   Bilateral Distance:    Right Eye Near:   Left Eye Near:    Bilateral Near:     Physical Exam Constitutional:      Appearance: Normal appearance.  HENT:     Head: Normocephalic.  Cardiovascular:     Rate and Rhythm: Normal rate and regular rhythm.  Pulmonary:     Effort: Pulmonary effort is normal.     Breath  sounds: Normal breath sounds.  Musculoskeletal:     Cervical back: Normal range of motion and neck supple.     Right lower leg: Swelling present.     Left lower leg: Swelling present.     Comments: Trace swelling, without erythema, torturous veins bilateral lower extremities. No calf swelling. No discoloration or localized area swelling.   Neurological:     Mental Status: He is alert.     UC Treatments / Results  Labs (all labs ordered are listed, but only abnormal results are displayed) Labs Reviewed - No data to display  EKG   Radiology No results found.  Procedures Procedures (including critical care time)  Medications Ordered in UC Medications - No data to display  Initial Impression / Assessment and Plan / UC Course  I have reviewed the triage vital signs and the nursing notes.  Pertinent labs & imaging results that were available during my care of the patient were reviewed by me and considered in my medical decision making (see chart for details).    Suspect underlying chronic venous insufficiency given the appearance of patient's torturous veins , bilateral lower extremities swelling, and discoloration of lower extremities occurring in the evening and improving overnight with elevating legs and rest. Low suspicion for DVT as patient is currently anticoagulation therapeutic dose of Warfarin (most recent PT/INR 2.0). Continue compression socks, follow-up with PCP on Monday to discuss treatments and possible referral to vein/vascular specialist . Strict ER precautions if symptoms worsen or do not improve. Final Clinical Impressions(s) / UC Diagnoses   Final diagnoses:  Pain and swelling of left lower leg  Varicose veins of bilateral lower extremities with other complications     Discharge Instructions      Keep follow-up with your primary care provider.  Recommend evaluation by a vascular specialist to rule out symptoms caused by any sort of peripheral vascular  disease or resulting from varicose veins.  No evidence today of any type of infection.  Your skin color is appropriate.  Recommend resuming use of your support hose elevating your feet and applying heat to the area in which pain is present on your lower left leg.  If you develop any severe pain or loss of sensation in your left lower extremity go immediately to the emergency department.     ED Prescriptions   None    PDMP not reviewed this encounter.   Scot Jun, [place] 03/10/21 [id number]

## 2021-03-06 NOTE — ED Triage Notes (Signed)
Pt is having left shin pain for several weeks. Pt was seen and treated by his PCP, but he continues to have issues.

## 2021-03-06 NOTE — Discharge Instructions (Signed)
Keep follow-up with your primary care provider.  Recommend evaluation by a vascular specialist to rule out symptoms caused by any sort of peripheral vascular disease or resulting from varicose veins.  No evidence today of any type of infection.  Your skin color is appropriate.  Recommend resuming use of your support hose elevating your feet and applying heat to the area in which pain is present on your lower left leg.  If you develop any severe pain or loss of sensation in your left lower extremity go immediately to the emergency department.

## 2021-03-07 NOTE — Telephone Encounter (Signed)
Doesn't sound infected  Will see him on Monday unless things worsen

## 2021-03-08 ENCOUNTER — Other Ambulatory Visit: Payer: Self-pay

## 2021-03-08 ENCOUNTER — Encounter: Payer: Self-pay | Admitting: Internal Medicine

## 2021-03-08 ENCOUNTER — Ambulatory Visit (INDEPENDENT_AMBULATORY_CARE_PROVIDER_SITE_OTHER): Payer: Medicare Other | Admitting: Internal Medicine

## 2021-03-08 ENCOUNTER — Ambulatory Visit: Payer: Medicare Other

## 2021-03-08 DIAGNOSIS — I872 Venous insufficiency (chronic) (peripheral): Secondary | ICD-10-CM | POA: Diagnosis not present

## 2021-03-08 MED ORDER — FUROSEMIDE 20 MG PO TABS: 20.0000 mg | ORAL_TABLET | Freq: Every day | ORAL | 0 refills | Status: DC | PRN

## 2021-03-08 NOTE — Assessment & Plan Note (Signed)
Had been on right since distant DVT---but now on left as well No apparent arterial insufficiency Discussed Rx options----he really doesn't really have pain using the support socks daily  Will try furosemide 20mg  for daily prn use in AM if swelling noticeable

## 2021-03-08 NOTE — Progress Notes (Signed)
Subjective:    Patient ID: Anthony Conch., male    DOB: 1945/06/15, 76 y.o.   MRN: [id number]  HPI Here due to ongoing problems with his left leg  Still had pain in his shin--but better now Called in 3 days ago---and was directed to [facility] Has discolored feet at night (purple) No clear infection--so no antibiotic Chronic right calf swelling---now in left also----even with the support hose  Current Outpatient Medications on File Prior to Visit  Medication Sig Dispense Refill   acetaminophen (TYLENOL) 650 MG CR tablet Take 650 mg by mouth 2 (two) times daily.     ALLERGY RELIEF 180 MG tablet TAKE ONE TABLET DAILY FOR CONGESTION. 30 tablet 5   ALPRAZolam (XANAX XR) 0.5 MG 24 hr tablet Take 1 tablet (0.5 mg total) by mouth daily. 30 tablet 3   atorvastatin (LIPITOR) 40 MG tablet Take 1 tablet (40 mg total) by mouth daily at 6 PM. 90 tablet 2   buPROPion (WELLBUTRIN XL) 150 MG 24 hr tablet Take 1 tablet (150 mg total) by mouth daily. 90 tablet 3   cloNIDine (CATAPRES) 0.1 MG tablet Take 1 tablet (0.1 mg total) by mouth 2 (two) times daily. 60 tablet 4   ezetimibe (ZETIA) 10 MG tablet Take 1 tablet (10 mg total) by mouth daily. 90 tablet 0   meclizine (ANTIVERT) 25 MG tablet Take 25 mg by mouth 3 (three) times daily as needed for dizziness. Reported on 07/01/2015     metoprolol tartrate (LOPRESSOR) 25 MG tablet Take 1 tablet (25 mg total) by mouth 2 (two) times daily. PLEASE CALL OFFICE TO SCHEDULE ANNUAL VISIT. 180 tablet 0   Multiple Vitamin (MULTIVITAMIN PO) Take 1 tablet by mouth daily. pm     omeprazole (PRILOSEC) 40 MG capsule Take 1 capsule (40 mg total) by mouth daily. 30 capsule 5   warfarin (COUMADIN) 5 MG tablet Take up to 1.5 tablets per day by mouth as directed by the coumadin clinic. 40 tablet 5   No current facility-administered medications on file prior to visit.    Allergies  Allergen Reactions   Iodine Hives    Contrast dye/ betadine ok     Past Medical History:  Diagnosis Date   Anxiety    CAD (coronary artery disease)    a. 07/2012 CTA chest - incidental coronary Ca2+ of LAD/LCX; b. 08/2012 Myoview: EF 59%, non ischemia/scar-->Low risk.   Clotting disorder ([place])    Colon polyps    2008 Tubular Adenoma   Cough    chronic   Deep vein thrombosis (DVT) (HCC) 04/06/2010   Depression    DVT (deep venous thrombosis) ([place])    a. RLE DVT ~ 05/2010-->chronic coumadin.   GERD (gastroesophageal reflux disease)    Hyperlipidemia    Hypertension    controlled on meds   IBS (irritable bowel syndrome)    NICM (nonischemic cardiomyopathy) ([place])    a. 06/2016 Echo: EF 45-50%, diff HK, mod dil LA/RA.   Palpitations    Persistent atrial fibrillation ([place])    a. Dx 06/2010 - CHA2DS2VASc = 3-4 (Chronic coumadin); b. 07/2016 s/p DCCV (150J) - initially successful but reverted to Afib.   Pneumonia    3 wks ago/ no hospitalization/ no follow up required/ was on antibiotic and steroids/Dr Edilia Bo   Presence of partial dental prosthetic device    lower right   Seasonal allergies     Past Surgical History:  Procedure Laterality Date  APPENDECTOMY     1967   CARDIOVERSION N/A 08/03/2016   Procedure: CARDIOVERSION;  Surgeon: Minna Merritts, MD;  Location: [facility] ORS;  Service: Cardiovascular;  Laterality: N/A;   CATARACT EXTRACTION W/PHACO Right 07/08/2015   Procedure: CATARACT EXTRACTION PHACO AND INTRAOCULAR LENS PLACEMENT ([place]) RIGHT EYE;  Surgeon: Leandrew Koyanagi, MD;  Location: [place];  Service: Ophthalmology;  Laterality: Right;   SYMFONY LENS   CATARACT EXTRACTION W/PHACO Left 08/05/2015   Procedure: CATARACT EXTRACTION PHACO AND INTRAOCULAR LENS PLACEMENT (IOC) left eye;  Surgeon: Leandrew Koyanagi, MD;  Location: [place];  Service: Ophthalmology;  Laterality: Left;  SYMFONY LENS   CATARACT EXTRACTION, BILATERAL Bilateral 08/2015   COLONOSCOPY     x3   POLYPECTOMY     TONSILLECTOMY AND  ADENOIDECTOMY      Family History  Problem Relation Age of Onset   Heart disease Mother    Hypertension Mother    Irritable bowel syndrome Mother    Heart attack Father    Hypertension Father    Prostate cancer Father     Social History   Socioeconomic History   Marital status: Married    Spouse name: Not on file   Number of children: 1   Years of education: Not on file   Highest education level: Not on file  Occupational History   Occupation: Scientist, clinical (histocompatibility and immunogenetics): [employer]  Tobacco Use   Smoking status: Former    Packs/day: 1.00    Years: 30.00    Pack years: 30.00    Types: Cigarettes    Quit date: 01/18/1992    Years since quitting: 29.1    Passive exposure: Never   Smokeless tobacco: Never  Vaping Use   Vaping Use: Never used  Substance and Sexual Activity   Alcohol use: Yes    Alcohol/week: 7.0 - 14.0 standard drinks    Types: 7 - 14 Glasses of wine per week    Comment: 1-2 glasses of wine at night    Drug use: No   Sexual activity: Yes  Other Topics Concern   Not on file  Social History Narrative   Has living will.   Desires CPR.   Would not want life support if recovery futile.  Unsure about feeding tube.   Lives with wife [name]   Activity: active on cattle farm   Diet: good water, G2 gatorade, fruits/vegetables daily    Social Determinants of Health   Financial Resource Strain: Low Risk    Difficulty of Paying Living Expenses: Not hard at all  Food Insecurity: Not on file  Transportation Needs: Not on file  Physical Activity: Not on file  Stress: Not on file  Social Connections: Not on file  Intimate Partner Violence: Not on file   Review of Systems No fever No sig pain Sleeps in recliner---no AM leg swelling    Objective:   Physical Exam Constitutional:      Appearance: Normal appearance.  Cardiovascular:     Comments: Faint pedal pulses Musculoskeletal:     Comments: Trace pedal/calf edema Venous congestion with  purple/blue discoloration in feet and lower calves Left upper thigh now normal again  Skin:    Comments: No foot or calf ulcers  Neurological:     Mental Status: He is alert.           Assessment & Plan:

## 2021-03-10 ENCOUNTER — Other Ambulatory Visit: Payer: Self-pay | Admitting: Cardiovascular Disease

## 2021-03-10 ENCOUNTER — Other Ambulatory Visit: Payer: Self-pay | Admitting: Family Medicine

## 2021-03-10 ENCOUNTER — Ambulatory Visit (INDEPENDENT_AMBULATORY_CARE_PROVIDER_SITE_OTHER): Payer: Medicare Other

## 2021-03-10 VITALS — Ht 71.0 in | Wt 229.0 lb

## 2021-03-10 DIAGNOSIS — Z0000 Encounter for general adult medical examination without abnormal findings: Secondary | ICD-10-CM

## 2021-03-10 NOTE — Progress Notes (Signed)
Subjective:   Anthony Kim. is a 76 y.o. male who presents for Medicare Annual/Subsequent preventive examination.  I connected with Bryson Corona today by telephone and verified that I am speaking with the correct person using two identifiers. Location patient: home Location provider: work Persons participating in the virtual visit: patient, Marine scientist.    I discussed the limitations, risks, security and privacy concerns of performing an evaluation and management service by telephone and the availability of in person appointments. I also discussed with the patient that there may be a patient responsible charge related to this service. The patient expressed understanding and verbally consented to this telephonic visit.    Interactive audio and video telecommunications were attempted between this provider and patient, however failed, due to patient having technical difficulties OR patient did not have access to video capability.  We continued and completed visit with audio only.  Some vital signs may be absent or patient reported.   Time Spent with patient on telephone encounter: 20 minutes  Review of Systems     Cardiac Risk Factors include: advanced age (>19men, >3 women);hypertension;dyslipidemia     Objective:    Today's Vitals   03/10/21 1355  Weight: 229 lb (103.9 kg)  Height: 5\' 11"  (1.803 m)  PainSc: 5    Body mass index is 31.94 kg/m.  Advanced Directives 03/10/2021 03/05/2020 09/16/2018 07/03/2018 06/22/2017 08/03/2016 09/29/2015  Does Patient Have a Medical Advance Directive? Yes Yes No Yes Yes No Yes  Type of Paramedic of [place];Living will [place];Living will - [place];Living will [place];Living will - [place]  Does patient want to make changes to medical advance directive? Yes (MAU/Ambulatory/Procedural Areas - Information given) - - No - Patient declined - - No  - Patient declined  Copy of [place] in Chart? - No - copy requested - Yes - validated most recent copy scanned in chart (See row information) No - copy requested - No - copy requested  Would patient like information on creating a medical advance directive? - - No - Patient declined - - - -    Current Medications (verified) Outpatient Encounter Medications as of 03/10/2021  Medication Sig   acetaminophen (TYLENOL) 650 MG CR tablet Take 650 mg by mouth 2 (two) times daily.   ALLERGY RELIEF 180 MG tablet TAKE ONE TABLET DAILY FOR CONGESTION.   ALPRAZolam (XANAX XR) 0.5 MG 24 hr tablet Take 1 tablet (0.5 mg total) by mouth daily.   atorvastatin (LIPITOR) 40 MG tablet Take 1 tablet (40 mg total) by mouth daily at 6 PM.   buPROPion (WELLBUTRIN XL) 150 MG 24 hr tablet Take 1 tablet (150 mg total) by mouth daily.   cloNIDine (CATAPRES) 0.1 MG tablet Take 1 tablet (0.1 mg total) by mouth 2 (two) times daily.   ezetimibe (ZETIA) 10 MG tablet Take 1 tablet (10 mg total) by mouth daily.   furosemide (LASIX) 20 MG tablet Take 1 tablet (20 mg total) by mouth daily as needed. In the morning---for increased leg swelling   meclizine (ANTIVERT) 25 MG tablet Take 25 mg by mouth 3 (three) times daily as needed for dizziness. Reported on 07/01/2015   metoprolol tartrate (LOPRESSOR) 25 MG tablet Take 1 tablet (25 mg total) by mouth 2 (two) times daily. PLEASE CALL OFFICE TO SCHEDULE ANNUAL VISIT.   Multiple Vitamin (MULTIVITAMIN PO) Take 1 tablet by mouth daily. pm   omeprazole (  PRILOSEC) 40 MG capsule Take 1 capsule (40 mg total) by mouth daily.   warfarin (COUMADIN) 5 MG tablet Take up to 1.5 tablets per day by mouth as directed by the coumadin clinic.   No facility-administered encounter medications on file as of 03/10/2021.    Allergies (verified) Iodine   History: Past Medical History:  Diagnosis Date   Anxiety    CAD (coronary artery disease)    a. 07/2012 CTA chest - incidental  coronary Ca2+ of LAD/LCX; b. 08/2012 Myoview: EF 59%, non ischemia/scar-->Low risk.   Clotting disorder ([place])    Colon polyps    2008 Tubular Adenoma   Cough    chronic   Deep vein thrombosis (DVT) ([facility]) 04/06/2010   Depression    DVT (deep venous thrombosis) ([place])    a. RLE DVT ~ 05/2010-->chronic coumadin.   GERD (gastroesophageal reflux disease)    Hyperlipidemia    Hypertension    controlled on meds   IBS (irritable bowel syndrome)    NICM (nonischemic cardiomyopathy) ([place])    a. 06/2016 Echo: EF 45-50%, diff HK, mod dil LA/RA.   Palpitations    Persistent atrial fibrillation ([place])    a. Dx 06/2010 - CHA2DS2VASc = 3-4 (Chronic coumadin); b. 07/2016 s/p DCCV (150J) - initially successful but reverted to Afib.   Pneumonia    3 wks ago/ no hospitalization/ no follow up required/ was on antibiotic and steroids/Dr Edilia Bo   Presence of partial dental prosthetic device    lower right   Seasonal allergies    Past Surgical History:  Procedure Laterality Date   APPENDECTOMY     1967   CARDIOVERSION N/A 08/03/2016   Procedure: CARDIOVERSION;  Surgeon: Minna Merritts, MD;  Location: [facility];  Service: Cardiovascular;  Laterality: N/A;   CATARACT EXTRACTION W/PHACO Right 07/08/2015   Procedure: CATARACT EXTRACTION PHACO AND INTRAOCULAR LENS PLACEMENT ([place]) RIGHT EYE;  Surgeon: Leandrew Koyanagi, MD;  Location: [facility];  Service: Ophthalmology;  Laterality: Right;   SYMFONY LENS   CATARACT EXTRACTION W/PHACO Left 08/05/2015   Procedure: CATARACT EXTRACTION PHACO AND INTRAOCULAR LENS PLACEMENT ([facility]) left eye;  Surgeon: Leandrew Koyanagi, MD;  Location: [place];  Service: Ophthalmology;  Laterality: Left;  SYMFONY LENS   CATARACT EXTRACTION, BILATERAL Bilateral 08/2015   COLONOSCOPY     x3   POLYPECTOMY     TONSILLECTOMY AND ADENOIDECTOMY     Family History  Problem Relation Age of Onset   Heart disease Mother    Hypertension Mother    Irritable bowel  syndrome Mother    Heart attack Father    Hypertension Father    Prostate cancer Father    Social History   Socioeconomic History   Marital status: Widowed    Spouse name: Not on file   Number of children: 1   Years of education: Not on file   Highest education level: Not on file  Occupational History   Occupation: Scientist, clinical (histocompatibility and immunogenetics): [employer]  Tobacco Use   Smoking status: Former    Packs/day: 1.00    Years: 30.00    Pack years: 30.00    Types: Cigarettes    Quit date: 01/18/1992    Years since quitting: 29.1    Passive exposure: Never   Smokeless tobacco: Never  Vaping Use   Vaping Use: Never used  Substance and Sexual Activity   Alcohol use: Yes    Alcohol/week: 12.0 standard drinks    Types: 12 Glasses of wine  per week    Comment: 1-2 glasses of wine 3-4 days a weeks   Drug use: No   Sexual activity: Yes  Other Topics Concern   Not on file  Social History Narrative   Has living will.   Desires CPR.   Would not want life support if recovery futile.  Unsure about feeding tube.   Lives with wife [name]   Activity: active on cattle farm   Diet: good water, G2 gatorade, fruits/vegetables daily    Social Determinants of Health   Financial Resource Strain: Low Risk    Difficulty of Paying Living Expenses: Not hard at all  Food Insecurity: No Food Insecurity   Worried About Charity fundraiser in the Last Year: Never true   Arboriculturist in the Last Year: Never true  Transportation Needs: No Transportation Needs   Lack of Transportation (Medical): No   Lack of Transportation (Non-Medical): No  Physical Activity: Inactive   Days of Exercise per Week: 0 days   Minutes of Exercise per Session: 0 min  Stress: No Stress Concern Present   Feeling of Stress : Only a little  Social Connections: Socially Isolated   Frequency of Communication with Friends and Family: More than three times a week   Frequency of Social Gatherings with Friends and Family:  Three times a week   Attends Religious Services: Never   Active Member of Clubs or Organizations: No   Attends Archivist Meetings: Never   Marital Status: Widowed    Tobacco Counseling Counseling given: Not Answered   Clinical Intake:  Pre-visit preparation completed: Yes  Pain : 0-10 Pain Score: 5  Pain Location: Back     BMI - recorded: 31.94 Nutritional Status: BMI > 30  Obese Nutritional Risks: None Diabetes: No  How often do you need to have someone help you when you read instructions, pamphlets, or other written materials from your doctor or pharmacy?: 1 - Never  Diabetic? No  Interpreter Needed?: No  Information entered by :: Orrin Brigham LPN   Activities of Daily Living In your present state of health, do you have any difficulty performing the following activities: 03/10/2021  Hearing? N  Vision? N  Difficulty concentrating or making decisions? N  Walking or climbing stairs? N  Dressing or bathing? N  Doing errands, shopping? N  Preparing Food and eating ? N  Using the Toilet? N  In the past six months, have you accidently leaked urine? N  Do you have problems with loss of bowel control? N  Managing your Medications? N  Managing your Finances? N  Housekeeping or managing your Housekeeping? N  Some recent data might be hidden    Patient Care Team: Ria Bush, MD as PCP - General (Family Medicine) Rockey Situ Kathlene November, MD as PCP - Cardiology (Cardiology) Minna Merritts, MD as Consulting Physician (Cardiology) Inda Castle, MD (Inactive) as Consulting Physician (Gastroenterology) McDiarmid, Blane Ohara, MD as Consulting Physician (Family Medicine) Bjorn Loser, MD as Consulting Physician (Urology) Philis Kendall, MD as Consulting Physician (Ophthalmology) Gaynelle Arabian, MD as Consulting Physician (Orthopedic Surgery) Leandrew Koyanagi, MD as Referring Physician (Ophthalmology) Debbora Dus, [facility] as Pharmacist  (Pharmacist)  Indicate any recent Medical Services you may have received from other than [facility] providers in the past year (date may be approximate).     Assessment:   This is a routine wellness examination for [place].  Hearing/Vision screen Hearing Screening - Comments:: No issues  Vision Screening -  Comments:: Last exam 03/2020, has an upcoming appointment, Dr. Wallace Going, wears readers   Dietary issues and exercise activities discussed: Current Exercise Habits: Home exercise routine (plans to start walking outside)   Goals Addressed             This Visit's Progress    Patient Stated       Would like to start walking        Depression Screen PHQ 2/9 Scores 03/10/2021 03/05/2020 07/03/2018 06/22/2017 09/29/2015 06/23/2014 03/22/2012  PHQ - 2 Score 0 2 0 0 0 0 0  PHQ- 9 Score - 2 0 0 - - -    Fall Risk Fall Risk  03/10/2021 03/05/2020 07/03/2018 06/22/2017 06/23/2014  Falls in the past year? 0 0 0 No No  Number falls in past yr: 0 0 - - -  Injury with Fall? 0 0 - - -  Risk for fall due to : No Fall Risks Medication side effect - - -  Follow up Falls prevention discussed Falls evaluation completed;Falls prevention discussed - - -    FALL RISK PREVENTION PERTAINING TO THE HOME:  Any stairs in or around the home? Yes  If so, are there any without handrails? No  Home free of loose throw rugs in walkways, pet beds, electrical cords, etc? Yes  Adequate lighting in your home to reduce risk of falls? Yes   ASSISTIVE DEVICES UTILIZED TO PREVENT FALLS:  Life alert? No  Use of a cane, walker or w/c? No  Grab bars in the bathroom? Yes  Shower chair or bench in shower? No  Elevated toilet seat or a handicapped toilet? Yes   TIMED UP AND GO:  Was the test performed? No .   Cognitive Function: Normal cognitive status assessed by  this Nurse Health Advisor. No abnormalities found.   MMSE - Mini Mental State Exam 03/05/2020 07/03/2018 06/22/2017 09/29/2015  Orientation to time 5 5 5 5    Orientation to Place 5 5 5 5   Registration 3 3 3 3   Attention/ Calculation 5 0 0 0  Recall 3 3 3 3   Language- name 2 objects - 0 0 0  Language- repeat 1 1 1 1   Language- follow 3 step command - 0 3 3  Language- read & follow direction - 0 0 0  Write a sentence - 0 0 0  Copy design - 0 0 0  Total score - 17 20 20         Immunizations Immunization History  Administered Date(s) Administered   Fluad Quad(high Dose 65+) 10/18/2018, 10/30/2019   Influenza Split 10/31/2011   Influenza Whole 10/30/2007, 10/28/2008, 10/27/2009   Influenza,inj,Quad PF,6+ Mos 11/13/2013, 10/14/2014, 10/01/2015, 01/12/2017, 11/09/2017   Influenza-Unspecified 09/17/2020   Moderna Sars-Covid-2 Vaccination 03/06/2019, 04/03/2019, 11/21/2019   Pneumococcal Conjugate-13 11/13/2013   Pneumococcal Polysaccharide-23 03/23/2011   Td 08/03/2000, 09/16/2008   Zoster, Live 10/28/2008    TDAP status: Due, Education has been provided regarding the importance of this vaccine. Advised may receive this vaccine at local pharmacy or Health Dept. Aware to provide a copy of the vaccination record if obtained from local pharmacy or Health Dept. Verbalized acceptance and understanding.  Flu Vaccine status: Up to date  Pneumococcal vaccine status: Up to date  Covid-19 vaccine status: Information provided on how to obtain vaccines.   Qualifies for Shingles Vaccine? Yes   Zostavax completed Yes   Shingrix Completed?: No.    Education has been provided regarding the importance of this vaccine. Patient has been  advised to call insurance company to determine out of pocket expense if they have not yet received this vaccine. Advised may also receive vaccine at local pharmacy or Health Dept. Verbalized acceptance and understanding.  Screening Tests Health Maintenance  Topic Date Due   Zoster Vaccines- Shingrix (1 of 2) Never done   COVID-19 Vaccine (4 - Booster for Moderna series) 01/16/2020   TETANUS/TDAP  03/06/2023  (Originally 09/17/2018)   Pneumonia Vaccine 107+ Years old  Completed   INFLUENZA VACCINE  Completed   Hepatitis C Screening  Completed   HPV VACCINES  Aged Out   COLONOSCOPY (Pts 45-32yrs Insurance coverage will need to be confirmed)  Discontinued    Health Maintenance  Health Maintenance Due  Topic Date Due   Zoster Vaccines- Shingrix (1 of 2) Never done   COVID-19 Vaccine (4 - Booster for Moderna series) 01/16/2020    Colorectal cancer screening: No longer required.   Lung Cancer Screening: (Low Dose CT Chest recommended if Age 20-80 years, 30 pack-year currently smoking OR have quit w/in 15years.) does qualify. Patient will discuss with PCP    Additional Screening:  Hepatitis C Screening: does qualify; Completed 06/22/17  Vision Screening: Recommended annual ophthalmology exams for early detection of glaucoma and other disorders of the eye. Is the patient up to date with their annual eye exam?  Yes  Who is the provider or what is the name of the office in which the patient attends annual eye exams? Dr. Wallace Going   Dental Screening: Recommended annual dental exams for proper oral hygiene  Community Resource Referral / Chronic Care Management: CRR required this visit?  No   CCM required this visit?  No      Plan:     I have personally reviewed and noted the following in the patients chart:   Medical and social history Use of alcohol, tobacco or illicit drugs  Current medications and supplements including opioid prescriptions. Patient is not currently taking opioid prescriptions. Functional ability and status Nutritional status Physical activity Advanced directives List of other physicians Hospitalizations, surgeries, and ER visits in previous 12 months Vitals Screenings to include cognitive, depression, and falls Referrals and appointments  In addition, I have reviewed and discussed with patient certain preventive protocols, quality metrics, and best practice  recommendations. A written personalized care plan for preventive services as well as general preventive health recommendations were provided to patient.   Due to this being a telephonic visit, the after visit summary with patients personalized plan was offered to patient via mail or my-chart. Patient would like to access on my-chart.    Loma Messing, LPN   [id number]   Nurse Health Advisor  Nurse Notes: none

## 2021-03-10 NOTE — Patient Instructions (Signed)
Anthony Kim , Thank you for taking time to complete your Medicare Wellness Visit. I appreciate your ongoing commitment to your health goals. Please review the following plan we discussed and let me know if I can assist you in the future.   Screening recommendations/referrals: Colonoscopy: no longer required  Recommended yearly ophthalmology/optometry visit for glaucoma screening and checkup Recommended yearly dental visit for hygiene and checkup  Vaccinations: Influenza vaccine: up to date Pneumococcal vaccine: up to date  Tdap vaccine: due, last completed 09/16/08, Medicare may cover in the event that you are injured Shingles vaccine: Discuss with pharmacy   Covid-19: newest booster available   Advanced directives: Please bring a copy of Living Will and/or Foster for your chart.   Conditions/risks identified: see problem list   Next appointment: Follow up in one year for your annual wellness visit.   Preventive Care 40 Years and Older, Male Preventive care refers to lifestyle choices and visits with your health care provider that can promote health and wellness. What does preventive care include? A yearly physical exam. This is also called an annual well check. Dental exams once or twice a year. Routine eye exams. Ask your health care provider how often you should have your eyes checked. Personal lifestyle choices, including: Daily care of your teeth and gums. Regular physical activity. Eating a healthy diet. Avoiding tobacco and drug use. Limiting alcohol use. Practicing safe sex. Taking low doses of aspirin every day. Taking vitamin and mineral supplements as recommended by your health care provider. What happens during an annual well check? The services and screenings done by your health care provider during your annual well check will depend on your age, overall health, lifestyle risk factors, and family history of disease. Counseling  Your health care  provider may ask you questions about your: Alcohol use. Tobacco use. Drug use. Emotional well-being. Home and relationship well-being. Sexual activity. Eating habits. History of falls. Memory and ability to understand (cognition). Work and work Statistician. Screening  You may have the following tests or measurements: Height, weight, and BMI. Blood pressure. Lipid and cholesterol levels. These may be checked every 5 years, or more frequently if you are over 63 years old. Skin check. Lung cancer screening. You may have this screening every year starting at age 41 if you have a 30-pack-year history of smoking and currently smoke or have quit within the past 15 years. Fecal occult blood test (FOBT) of the stool. You may have this test every year starting at age 20. Flexible sigmoidoscopy or colonoscopy. You may have a sigmoidoscopy every 5 years or a colonoscopy every 10 years starting at age 77. Prostate cancer screening. Recommendations will vary depending on your family history and other risks. Hepatitis C blood test. Hepatitis B blood test. Sexually transmitted disease (STD) testing. Diabetes screening. This is done by checking your blood sugar (glucose) after you have not eaten for a while (fasting). You may have this done every 1-3 years. Abdominal aortic aneurysm (AAA) screening. You may need this if you are a current or former smoker. Osteoporosis. You may be screened starting at age 71 if you are at high risk. Talk with your health care provider about your test results, treatment options, and if necessary, the need for more tests. Vaccines  Your health care provider may recommend certain vaccines, such as: Influenza vaccine. This is recommended every year. Tetanus, diphtheria, and acellular pertussis (Tdap, Td) vaccine. You may need a Td booster every 10 years. Zoster  vaccine. You may need this after age 72. Pneumococcal 13-valent conjugate (PCV13) vaccine. One dose is  recommended after age 25. Pneumococcal polysaccharide (PPSV23) vaccine. One dose is recommended after age 38. Talk to your health care provider about which screenings and vaccines you need and how often you need them. This information is not intended to replace advice given to you by your health care provider. Make sure you discuss any questions you have with your health care provider. Document Released: 01/30/2015 Document Revised: 09/23/2015 Document Reviewed: 11/04/2014 Elsevier Interactive Patient Education  2017 [place] Prevention in the Home Falls can cause injuries. They can happen to people of all ages. There are many things you can do to make your home safe and to help prevent falls. What can I do on the outside of my home? Regularly fix the edges of walkways and driveways and fix any cracks. Remove anything that might make you trip as you walk through a door, such as a raised step or threshold. Trim any bushes or trees on the path to your home. Use bright outdoor lighting. Clear any walking paths of anything that might make someone trip, such as rocks or tools. Regularly check to see if handrails are loose or broken. Make sure that both sides of any steps have handrails. Any raised decks and porches should have guardrails on the edges. Have any leaves, snow, or ice cleared regularly. Use sand or salt on walking paths during winter. Clean up any spills in your garage right away. This includes oil or grease spills. What can I do in the bathroom? Use night lights. Install grab bars by the toilet and in the tub and shower. Do not use towel bars as grab bars. Use non-skid mats or decals in the tub or shower. If you need to sit down in the shower, use a plastic, non-slip stool. Keep the floor dry. Clean up any water that spills on the floor as soon as it happens. Remove soap buildup in the tub or shower regularly. Attach bath mats securely with double-sided non-slip rug  tape. Do not have throw rugs and other things on the floor that can make you trip. What can I do in the bedroom? Use night lights. Make sure that you have a light by your bed that is easy to reach. Do not use any sheets or blankets that are too big for your bed. They should not hang down onto the floor. Have a firm chair that has side arms. You can use this for support while you get dressed. Do not have throw rugs and other things on the floor that can make you trip. What can I do in the kitchen? Clean up any spills right away. Avoid walking on wet floors. Keep items that you use a lot in easy-to-reach places. If you need to reach something above you, use a strong step stool that has a grab bar. Keep electrical cords out of the way. Do not use floor polish or wax that makes floors slippery. If you must use wax, use non-skid floor wax. Do not have throw rugs and other things on the floor that can make you trip. What can I do with my stairs? Do not leave any items on the stairs. Make sure that there are handrails on both sides of the stairs and use them. Fix handrails that are broken or loose. Make sure that handrails are as long as the stairways. Check any carpeting to make sure that it  is firmly attached to the stairs. Fix any carpet that is loose or worn. Avoid having throw rugs at the top or bottom of the stairs. If you do have throw rugs, attach them to the floor with carpet tape. Make sure that you have a light switch at the top of the stairs and the bottom of the stairs. If you do not have them, ask someone to add them for you. What else can I do to help prevent falls? Wear shoes that: Do not have high heels. Have rubber bottoms. Are comfortable and fit you well. Are closed at the toe. Do not wear sandals. If you use a stepladder: Make sure that it is fully opened. Do not climb a closed stepladder. Make sure that both sides of the stepladder are locked into place. Ask someone to  hold it for you, if possible. Clearly mark and make sure that you can see: Any grab bars or handrails. First and last steps. Where the edge of each step is. Use tools that help you move around (mobility aids) if they are needed. These include: Canes. Walkers. Scooters. Crutches. Turn on the lights when you go into a dark area. Replace any light bulbs as soon as they burn out. Set up your furniture so you have a clear path. Avoid moving your furniture around. If any of your floors are uneven, fix them. If there are any pets around you, be aware of where they are. Review your medicines with your doctor. Some medicines can make you feel dizzy. This can increase your chance of falling. Ask your doctor what other things that you can do to help prevent falls. This information is not intended to replace advice given to you by your health care provider. Make sure you discuss any questions you have with your health care provider. Document Released: 10/30/2008 Document Revised: 06/11/2015 Document Reviewed: 02/07/2014 Elsevier Interactive Patient Education  2017 [employer].

## 2021-03-10 NOTE — Telephone Encounter (Signed)
Patient needs an appt.

## 2021-03-10 NOTE — Telephone Encounter (Signed)
Please call patient and schedule annual physical then send back to [facility] for refill.

## 2021-03-15 NOTE — Telephone Encounter (Signed)
Called pt and scheduled a physical for June 28th @2 :30.

## 2021-03-16 ENCOUNTER — Other Ambulatory Visit: Payer: Self-pay | Admitting: Family Medicine

## 2021-03-16 ENCOUNTER — Other Ambulatory Visit: Payer: Self-pay | Admitting: Cardiovascular Disease

## 2021-03-24 NOTE — Telephone Encounter (Signed)
Pt scheduled in June for cpe/lab ?

## 2021-03-24 NOTE — Telephone Encounter (Signed)
Noted. ?Refill sent to the pharmacy. ?

## 2021-03-24 NOTE — Telephone Encounter (Signed)
Noted. ?Last refill 03/11/20 #90/3 ?See drug warning with Metoprolol. ?

## 2021-03-30 DIAGNOSIS — H43813 Vitreous degeneration, bilateral: Secondary | ICD-10-CM | POA: Diagnosis not present

## 2021-03-30 DIAGNOSIS — Z0100 Encounter for examination of eyes and vision without abnormal findings: Secondary | ICD-10-CM | POA: Diagnosis not present

## 2021-04-07 ENCOUNTER — Ambulatory Visit (INDEPENDENT_AMBULATORY_CARE_PROVIDER_SITE_OTHER): Payer: Medicare Other

## 2021-04-07 ENCOUNTER — Other Ambulatory Visit: Payer: Self-pay

## 2021-04-07 DIAGNOSIS — Z5181 Encounter for therapeutic drug level monitoring: Secondary | ICD-10-CM

## 2021-04-07 DIAGNOSIS — Z86718 Personal history of other venous thrombosis and embolism: Secondary | ICD-10-CM

## 2021-04-07 LAB — POCT INR: INR: 1.6 — AB (ref 2.0–3.0)

## 2021-04-07 NOTE — Patient Instructions (Signed)
-   take 1.5 tablets tonight and tomorrow, then ?- continue dosage of 1 tablet warfarin daily EXCEPT 1/2 TABLET ON MONDAYS ?- recheck in 4 weeks ?

## 2021-04-21 ENCOUNTER — Ambulatory Visit (INDEPENDENT_AMBULATORY_CARE_PROVIDER_SITE_OTHER): Payer: Medicare Other | Admitting: Cardiovascular Disease

## 2021-04-21 ENCOUNTER — Encounter: Payer: Self-pay | Admitting: Cardiovascular Disease

## 2021-04-21 VITALS — BP 140/84 | HR 71 | Ht 70.5 in | Wt 227.4 lb

## 2021-04-21 DIAGNOSIS — I82491 Acute embolism and thrombosis of other specified deep vein of right lower extremity: Secondary | ICD-10-CM | POA: Diagnosis not present

## 2021-04-21 DIAGNOSIS — E7800 Pure hypercholesterolemia, unspecified: Secondary | ICD-10-CM

## 2021-04-21 DIAGNOSIS — I4819 Other persistent atrial fibrillation: Secondary | ICD-10-CM

## 2021-04-21 DIAGNOSIS — I714 Abdominal aortic aneurysm, without rupture, unspecified: Secondary | ICD-10-CM | POA: Diagnosis not present

## 2021-04-21 DIAGNOSIS — I10 Essential (primary) hypertension: Secondary | ICD-10-CM

## 2021-04-21 MED ORDER — METOPROLOL TARTRATE 25 MG PO TABS
25.0000 mg | ORAL_TABLET | Freq: Two times a day (BID) | ORAL | 3 refills | Status: DC
Start: 2021-04-21 — End: 2022-05-05

## 2021-04-21 MED ORDER — CLONIDINE HCL 0.1 MG PO TABS: 0.1000 mg | ORAL_TABLET | Freq: Two times a day (BID) | ORAL | 4 refills | Status: DC

## 2021-04-21 MED ORDER — EZETIMIBE 10 MG PO TABS: ORAL_TABLET | ORAL | 0 refills | Status: DC

## 2021-04-21 NOTE — Patient Instructions (Signed)
Medication Instructions:  No changes  If you need a refill on your cardiac medications before your next appointment, please call your pharmacy.   Lab work: No new labs needed  Testing/Procedures: No new testing needed  Follow-Up: At [facility], you and your health needs are our priority.  As part of our continuing mission to provide you with exceptional heart care, we have created designated Provider Care Teams.  These Care Teams include your primary Cardiologist (physician) and Advanced Practice Providers (APPs -  Physician Assistants and Nurse Practitioners) who all work together to provide you with the care you need, when you need it.  You will need a follow up appointment in 12 months  Providers on your designated Care Team:   Christopher Berge, NP Ryan Dunn, PA-C Cadence Furth, PA-C  COVID-19 Vaccine Information can be found at: [email or web address] For questions related to vaccine distribution or appointments, please email [email or web address] or call [phone].   

## 2021-04-21 NOTE — Progress Notes (Signed)
Cardiology Office Note ? ?Date:  04/21/2021  ? ?ID:  Anthony Conch., DOB 06-30-1945, MRN [id number] ? ?PCP:  Ria Bush, MD  ? ?Chief Complaint  ?Patient presents with  ? 12 month follow up   ?  Patient c/o chest pain at times. Medications reviewed by the patient verbally.   ? ? 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 ?LOV 03/2019 ? ?Wife passed February 2022, ALS ?Had been getting sick over the past 6 yrs, falling, muscles and balance getting worse ?At the end required tube feeding ? 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 ?Chronic trace lower extremity edema, in the past for compression hose ? ?Recent records reviewed ?Cellulitis left leg last month, ER 03/06/21 ?Treated with ABX ? ?EKG personally reviewed by myself on todays visit ?Atrial fibrillation ventricular rate 71 beats minute, no change from prior EKG ? ?Other past medical history reviewed ?Permanent atrial fibrillation starting back in 2018 ?Asymptomatic from  his atrial fibrillation ?Prefered to stay on warfarin ? ?In the hospital; 08/2018 ?CT scan reviewed in detail ?acute pancreatitis. ?Aortic Atherosclerosis also noted ? ?Weight down 20 pounds in one year ? ?EKG personally reviewed by myself on todays visit ?Shows atrial fibrillation rate 71 bpm no significant ST or T wave changes ? ?Other past medical history reviewed ?Prior CT scan of the chest from 2014  ? LAD and left circumflex coronary artery disease ?  ?He wears compression hose given his history of DVT ?  ? ?PMH:   has a past medical history of Anxiety, CAD (coronary artery disease), Clotting disorder ([place]), Colon polyps, Cough, Deep vein thrombosis (DVT) ([place]) (04/06/2010), Depression, DVT (deep venous thrombosis) ([facility]), GERD (gastroesophageal reflux disease), Hyperlipidemia, Hypertension, IBS (irritable bowel syndrome), NICM (nonischemic cardiomyopathy) ([place]), Palpitations, Persistent atrial fibrillation ([place]), Pneumonia, Presence of partial dental prosthetic device, and Seasonal allergies. ? ?PSH:    ?Past Surgical History:  ?Procedure Laterality Date  ? APPENDECTOMY    ? 1967  ? CARDIOVERSION N/A 08/03/2016  ? Procedure: CARDIOVERSION;  Surgeon: Minna Merritts, MD;  Location: [facility];  Service: Cardiovascular;  Laterality: N/A;  ? CATARACT EXTRACTION W/PHACO Right 07/08/2015  ? Procedure: CATARACT EXTRACTION PHACO AND INTRAOCULAR LENS PLACEMENT ([place]) RIGHT EYE;  Surgeon: Leandrew Koyanagi, MD;  Location: [facility];  Service: Ophthalmology;  Laterality: Right;   SYMFONY LENS  ? CATARACT EXTRACTION W/PHACO Left 08/05/2015  ? Procedure: CATARACT EXTRACTION PHACO AND INTRAOCULAR LENS PLACEMENT ([place]) left eye;  Surgeon: Leandrew Koyanagi, MD;  Location: [facility];  Service: Ophthalmology;  Laterality: Left;  SYMFONY LENS  ? CATARACT EXTRACTION, BILATERAL Bilateral 08/2015  ? COLONOSCOPY    ? x3  ? POLYPECTOMY    ? TONSILLECTOMY AND ADENOIDECTOMY    ? ? ?Current Outpatient Medications   ?Medication Sig Dispense Refill  ? acetaminophen (TYLENOL) 650 MG CR tablet Take 650 mg by mouth 2 (two) times daily.    ? ALLERGY RELIEF 180 MG tablet TAKE ONE TABLET DAILY FOR CONGESTION. 30 tablet 2  ? ALPRAZolam (XANAX XR) 0.5 MG 24 hr tablet Take 1 tablet (0.5 mg total) by mouth daily. 30 tablet 3  ? atorvastatin (LIPITOR) 40 MG tablet Take 1 tablet (40 mg total) by mouth daily at 6 PM. 90 tablet 0  ? buPROPion (WELLBUTRIN XL) 150 MG 24 hr tablet Take 1 tablet (150 mg total) by mouth daily. 90 tablet 0  ? cloNIDine (CATAPRES) 0.1 MG tablet Take 1 tablet (0.1 mg total) by mouth 2 (two) times daily. 60 tablet 4  ? ezetimibe (ZETIA) 10 MG tablet Take 1 tablet (10 mg total) by mouth daily. 90 tablet 0  ? furosemide (LASIX) 20 MG tablet Take 1 tablet (20 mg total) by mouth daily as needed. In the morning---for increased leg swelling 30 tablet 0  ? meclizine (ANTIVERT) 25 MG tablet Take 25 mg by mouth 3 (three) times daily as needed for dizziness. Reported on 07/01/2015    ? metoprolol tartrate (LOPRESSOR) 25 MG tablet Take 1 tablet (25 mg total) by mouth 2 (two) times daily. PLEASE CALL OFFICE TO SCHEDULE ANNUAL VISIT. 180 tablet 0  ? Multiple Vitamin (MULTIVITAMIN PO) Take 1 tablet by mouth daily. pm    ? omeprazole (PRILOSEC) 40 MG capsule Take 1 capsule (40 mg total) by mouth daily. PLEASE SCHEDULE OFFICE VISIT FOR FURTHER REFILLS! THANK YOU! 30 capsule 1  ? warfarin (COUMADIN) 5 MG tablet Take up to 1.5 tablets per day by mouth as directed by the coumadin clinic. 40 tablet 5  ? ?No current facility-administered medications for this visit.  ? ? ? ?Allergies:   Iodine  ? ?Social History:  The patient  reports that he quit smoking about 29 years ago. His smoking use included cigarettes. He has a 30.00 pack-year smoking history. He has never been exposed to tobacco smoke. He has never used smokeless tobacco. He reports current alcohol use of about 12.0 standard drinks per week. He reports that he does not use drugs.   ? ?Family History:   family history includes Heart attack in his father; Heart disease in his mother; Hypertension in his father and mother; Irritable bowel syndrome in his mother; Prostate cancer in his father.  ? ? ?Review of Systems: ?Review of Systems  ?Constitutional: Negative.   ?HENT: Negative.    ?Respiratory: Negative.    ?Cardiovascular:  Positive for leg swelling.  ?Gastrointestinal: Negative.   ?Musculoskeletal: Negative.   ?Neurological: Negative.   ?Psychiatric/Behavioral: Negative.    ?All other systems reviewed and are negative. ? ?PHYSICAL EXAM: ?VS:  BP 140/84 (BP Location: Left Arm, Patient Position: Sitting, Cuff Size: Normal)   Pulse 71   Ht 5' 10.5" (1.791 m)   Wt 103.1 kg   SpO2 98%   BMI 32.16 kg/m?  , BMI Body mass index is 32.16 kg/m?Marland Kitchen  ?Constitutional:  oriented to person, place, and time. No distress.  ?HENT:  ?Head: Grossly normal ?Eyes:  no discharge. No scleral icterus.  ?Neck: No JVD, no carotid bruits  ?Cardiovascular: Irregularly irregular, no murmurs appreciated trace lower extremity edema ?Pulmonary/Chest: Clear  to auscultation bilaterally, no wheezes or rales ?Abdominal: Soft.  no distension.  no tenderness.  ?Musculoskeletal: Normal range of motion ?Neurological:  normal muscle tone. Coordination normal. No atrophy ?Skin: Skin warm and dry ?Psychiatric: normal affect, pleasant ? ?Recent Labs: ?No results found for requested labs within last 8760 hours.  ? ? ?Lipid Panel ?Lab Results  ?Component Value Date  ? CHOL 129 03/11/2020  ? HDL 53.50 03/11/2020  ? [place] 58 03/11/2020  ? TRIG 92.0 03/11/2020  ? ? ?Wt Readings from Last 3 Encounters:  ?04/21/21 103.1 kg  ?03/10/21 103.9 kg  ?03/08/21 103.9 kg  ? ? ?ASSESSMENT AND PLAN: ?  ?Atrial fibrillation,persistent  ?Asymptomatic, rate controlled on warfarin ?Denies any bleeding issues ?Would like to have lab work with primary care in follow-up ? ?Essential hypertension -  ?Blood pressure is well controlled on today's visit. No  changes made to the medications. ? ?HYPERCHOLESTEROLEMIA ?Continue Lipitor Zetia, numbers reviewed from 2022, at goal ? ?Localized edema ?Chronic lower extremity edema, stable, unchanged ?history of DVT, on

## 2021-05-05 ENCOUNTER — Ambulatory Visit (INDEPENDENT_AMBULATORY_CARE_PROVIDER_SITE_OTHER): Payer: Medicare Other

## 2021-05-05 DIAGNOSIS — Z5181 Encounter for therapeutic drug level monitoring: Secondary | ICD-10-CM

## 2021-05-05 DIAGNOSIS — Z86718 Personal history of other venous thrombosis and embolism: Secondary | ICD-10-CM

## 2021-05-05 LAB — POCT INR: INR: 1.7 — AB (ref 2.0–3.0)

## 2021-05-05 NOTE — Patient Instructions (Signed)
-   take 2 tablets tonight only then ?- continue dosage of 1 tablet warfarin daily EXCEPT 1/2 TABLET ON MONDAYS ?- recheck in 4 weeks ? ?

## 2021-05-07 ENCOUNTER — Other Ambulatory Visit: Payer: Self-pay | Admitting: Cardiovascular Disease

## 2021-05-10 ENCOUNTER — Other Ambulatory Visit: Payer: Self-pay | Admitting: Cardiovascular Disease

## 2021-05-19 ENCOUNTER — Telehealth: Payer: Self-pay | Admitting: Cardiovascular Disease

## 2021-05-19 ENCOUNTER — Other Ambulatory Visit: Payer: Self-pay

## 2021-05-19 MED ORDER — WARFARIN SODIUM 5 MG PO TABS: ORAL_TABLET | ORAL | 1 refills | Status: DC

## 2021-05-19 MED ORDER — OMEPRAZOLE 40 MG PO CPDR: 40.0000 mg | DELAYED_RELEASE_CAPSULE | Freq: Every day | ORAL | 3 refills | Status: DC

## 2021-05-19 NOTE — Telephone Encounter (Addendum)
Called and spoke to [place] from [facility]. Abigail Butts stated that pt called and asked for a refill of warfarin but stated he is on a different dose then what they had on file. Per Abigail Butts, pt stated he is to take warfarin 1 tablet ('5mg'$ ) daily except for 1/2 a tablet (2.'5mg'$ ) on Mondays. Per last anti-coag visit from 4/19 pt is taking the correct dose as instructed by coumadin clinic. [facility] stated they had on file pt could take up to 1.5 tablets of warfarin daily.  Will send in new refill of warfarin.  ? ? ? ?Lov: [place] 04/21/2021 ?Warfarin:'5mg'$  ?INR check: 5/17 ? ? ?

## 2021-05-19 NOTE — Telephone Encounter (Signed)
?*  STAT* If patient is at the pharmacy, call can be transferred to refill team. ? ? ?1. Which medications need to be refilled? (please list name of each medication and dose if known)  ?omeprazole (PRILOSEC) 40 MG capsule ? ?2. Which pharmacy/location (including street and city if local pharmacy) is medication to be sent to? ? ?[facility], [place] ? ?3. Do they need a 30 day or 90 day supply?  ? ?90 day supply ?Patient is completely out of medication + Patient was seen on 4/05 by Dr. Rockey Situ ?

## 2021-05-19 NOTE — Telephone Encounter (Signed)
Pt c/o medication issue: ? ?1. Name of Medication:  ?warfarin (COUMADIN) 5 MG tablet ? ?2. How are you currently taking this medication (dosage and times per day)?  ? ?3. Are you having a reaction (difficulty breathing--STAT)?  ? ?4. What is your medication issue?  ? ?Abigail Butts with [facility] states per patient Warfarin has been modified. She is requesting clarification on new instructions for Warfarin. She also states a new Rx will be needed because they did not receive an update. ? ?

## 2021-05-19 NOTE — Telephone Encounter (Signed)
omeprazole (PRILOSEC) 40 MG capsule 90 capsule 3 05/19/2021    ?Sig - Route: Take 1 capsule (40 mg total) by mouth daily. - Oral   ?Sent to pharmacy as: omeprazole (PRILOSEC) 40 MG capsule   ?E-Prescribing Status: Sent to pharmacy (05/19/2021  1:59 PM EDT)   ? ?Pharmacy ? ?[facility], [place] - [address]  ? ?

## 2021-06-01 ENCOUNTER — Encounter: Payer: Self-pay | Admitting: Gastroenterology

## 2021-06-02 ENCOUNTER — Telehealth: Payer: Self-pay

## 2021-06-02 NOTE — Progress Notes (Signed)
? ? ?  Chronic Care Management ?Pharmacy Assistant  ? ?Name: Anthony Kim.  MRN: [id number] DOB: 1946/01/09 ? ?Reason for Encounter: CCM Counsellor) ?  ?Recent office visits:  ?03/10/21 AWV ?03/08/21 Viviana Simpler, MD (Internal Medicine): Chronic venous insufficiency. Start: Furosemide 20 mg PRN ?02/19/21 Viviana Simpler, MD (Internal Medicine): Cellulitis of left leg. Start: CEFTIN 500 MG tablet Change: Acetaminophen 650 mg BID vs. PRN Administered: ROCEPHIN injection 1 g ? ?Recent consult visits:  ?04/21/21 Ida Rogue, MD (Cardiology): Atrial Fibrilation; EKG No med changes. F/U 12 months ?03/30/21 Chadwich Brasington (Ophthalmology): Vitreous degeneration; no other information.  ?03/10/21 Loura Pardon, MD (Family Medicine): Adult exam; no information provided.  ?03/06/21 [facility]: Leg pain Diagnoses: Varicose veins of bilateral lower extremities ?12/30/20 Beverly Gust (Otolaryngology): Hearing loss ? ?Hospital visits:  ?None in previous 6 months ? ?Medications: ?Outpatient Encounter Medications as of 06/02/2021  ?Medication Sig  ? acetaminophen (TYLENOL) 650 MG CR tablet Take 650 mg by mouth 2 (two) times daily.  ? ALLERGY RELIEF 180 MG tablet TAKE ONE TABLET DAILY FOR CONGESTION.  ? ALPRAZolam (XANAX XR) 0.5 MG 24 hr tablet Take 1 tablet (0.5 mg total) by mouth daily.  ? atorvastatin (LIPITOR) 40 MG tablet Take 1 tablet (40 mg total) by mouth daily at 6 PM.  ? buPROPion (WELLBUTRIN XL) 150 MG 24 hr tablet Take 1 tablet (150 mg total) by mouth daily.  ? cloNIDine (CATAPRES) 0.1 MG tablet Take 1 tablet (0.1 mg total) by mouth 2 (two) times daily.  ? ezetimibe (ZETIA) 10 MG tablet Take 1 tablet (10 mg total) by mouth daily.  ? furosemide (LASIX) 20 MG tablet Take 1 tablet (20 mg total) by mouth daily as needed. In the morning---for increased leg swelling  ? meclizine (ANTIVERT) 25 MG tablet Take 25 mg by mouth 3 (three) times daily as needed for dizziness. Reported on 07/01/2015  ?  metoprolol tartrate (LOPRESSOR) 25 MG tablet Take 1 tablet (25 mg total) by mouth 2 (two) times daily.  ? Multiple Vitamin (MULTIVITAMIN PO) Take 1 tablet by mouth daily. pm  ? omeprazole (PRILOSEC) 40 MG capsule Take 1 capsule (40 mg total) by mouth daily.  ? warfarin (COUMADIN) 5 MG tablet Take 1/2 a tablet to 1 tablet by mouth daily or as directed by the coumadin clinic.  ? ?No facility-administered encounter medications on file as of 06/02/2021.  ? ?Zachery Conch. was contacted to remind of upcoming telephone visit with Charlene Brooke on 06/07/21 at 11:00. Patient was reminded to have any blood glucose and blood pressure readings available for review at appointment.  ? ?Patient confirmed appointment. ? ?Are you having any problems with your medications? No  ? ?Do you have any concerns you like to discuss with the pharmacist? No ? ?CCM referral has been placed prior to visit?  No  ? ?Star Rating Drugs: ?Medication:  Last Fill: Day Supply ?Atorvastatin 40 mg 03/10/2021 90 ? ?Charlene Brooke, CPP notified ? ?Marijean Niemann, RMA ?Clinical Pharmacy Assistant ?[phone] ? ? ? ? ? ?

## 2021-06-07 ENCOUNTER — Ambulatory Visit (INDEPENDENT_AMBULATORY_CARE_PROVIDER_SITE_OTHER): Payer: Medicare Other | Admitting: Pharmacist

## 2021-06-07 ENCOUNTER — Other Ambulatory Visit: Payer: Self-pay | Admitting: Cardiovascular Disease

## 2021-06-07 DIAGNOSIS — F331 Major depressive disorder, recurrent, moderate: Secondary | ICD-10-CM

## 2021-06-07 DIAGNOSIS — I10 Essential (primary) hypertension: Secondary | ICD-10-CM

## 2021-06-07 DIAGNOSIS — E7800 Pure hypercholesterolemia, unspecified: Secondary | ICD-10-CM

## 2021-06-07 DIAGNOSIS — I4819 Other persistent atrial fibrillation: Secondary | ICD-10-CM

## 2021-06-07 NOTE — Progress Notes (Unsigned)
Chronic Care Management Pharmacy Note  06/07/2021 Name:  Anthony Kim. MRN:  [id number] DOB:  [birth year]  Summary: CCM F/U visit -Medications reviewed, pt endorses compliance as prescribed. He never took furosemide for PRN use and denies excess swelling at this time  Recommendations/Changes made from today's visit: -Removed furosemide from med list. No other changes.  Plan: -Bode will call patient 6 months for BP update -Pharmacist follow up televisit scheduled for 1 year -PCP CPE 07/14/21   Subjective: Anthony Kim. is an 76 y.o. year old male who is a primary patient of Ria Bush, MD.  The CCM team was consulted for assistance with disease management and care coordination needs.    Engaged with patient by telephone for follow up visit in response to provider referral for pharmacy case management and/or care coordination services.   Consent to Services:  The patient was given information about Chronic Care Management services, agreed to services, and gave verbal consent prior to initiation of services.  Please see initial visit note for detailed documentation.   Patient Care Team: Ria Bush, MD as PCP - General (Family Medicine) Rockey Situ Kathlene November, MD as PCP - Cardiology (Cardiology) Rockey Situ Kathlene November, MD as Consulting Physician (Cardiology) Inda Castle, MD (Inactive) as Consulting Physician (Gastroenterology) McDiarmid, Blane Ohara, MD as Consulting Physician (Family Medicine) Bjorn Loser, MD as Consulting Physician (Urology) Philis Kendall, MD as Consulting Physician (Ophthalmology) Gaynelle Arabian, MD as Consulting Physician (Orthopedic Surgery) Leandrew Koyanagi, MD as Referring Physician (Ophthalmology) Charlton Haws, [facility] as Pharmacist (Pharmacist)  Recent office visits: 03/10/21 AWV 03/08/21 Viviana Simpler, MD OV: Chronic venous insufficiency. Start: Furosemide 20 mg PRN 02/19/21 Viviana Simpler, MD OV:  Cellulitis of left leg. Start: CEFTIN 500 MG tablet Change: Acetaminophen 650 mg BID vs. PRN Administered: ROCEPHIN injection 1 g  Recent consult visits: 04/21/21 Ida Rogue, MD (Cardiology): Atrial Fibrilation; EKG No med changes. F/U 12 months 03/30/21 Chadwich Brasington (Ophthalmology): Vitreous degeneration 03/06/21 [facility]: Leg pain Diagnoses: Varicose veins of bilateral lower extremities. F/u with PCP, vascular specialist. 12/30/20 Beverly Gust (Otolaryngology): Hearing loss  Hospital visits: None in previous 6 months   Objective:  Lab Results  Component Value Date   CREATININE 0.98 03/11/2020   BUN 16 03/11/2020   GFR 75.33 03/11/2020   GFRNONAA >60 09/16/2018   GFRAA >60 09/16/2018   NA 141 03/11/2020   K 4.8 03/11/2020   CALCIUM 9.2 03/11/2020   CO2 33 (H) 03/11/2020   GLUCOSE 100 (H) 03/11/2020    Lab Results  Component Value Date/Time   GFR 75.33 03/11/2020 09:12 AM   GFR 70.54 10/18/2018 01:07 PM    Last diabetic Eye exam: No results found for: HMDIABEYEEXA  Last diabetic Foot exam: No results found for: HMDIABFOOTEX   Lab Results  Component Value Date   CHOL 129 03/11/2020   HDL 53.50 03/11/2020   LDLCALC 58 03/11/2020   LDLDIRECT 99.5 06/24/2013   TRIG 92.0 03/11/2020   CHOLHDL 2 03/11/2020       Latest Ref Rng & Units 03/11/2020    9:12 AM 10/18/2018    1:07 PM 09/16/2018   12:03 PM  Hepatic Function  Total Protein 6.0 - 8.3 g/dL 6.6   6.7   7.1    Albumin 3.5 - 5.2 g/dL 4.0   4.2   3.9    AST 0 - 37 U/L _0 ALT 0 - 53 U/L  25   38   27    Alk Phosphatase 39 - 117 U/L 69   76   68    Total Bilirubin 0.2 - 1.2 mg/dL 1.1   1.3   1.4    Bilirubin, Direct 0.0 - 0.2 mg/dL   0.3      Lab Results  Component Value Date/Time   TSH 2.95 03/11/2020 09:12 AM   TSH 1.15 04/30/2008 10:16 AM       Latest Ref Rng & Units 03/11/2020    9:12 AM 09/16/2018   12:03 PM 06/22/2017   10:12 AM  CBC  WBC 4.0 - 10.5 K/uL 5.7    9.5   6.2    Hemoglobin 13.0 - 17.0 g/dL 14.5   15.0   15.8    Hematocrit 39.0 - 52.0 % 42.5   43.7   46.8    Platelets 150.0 - 400.0 K/uL 129.0   152   157.0      No results found for: VD25OH  Clinical ASCVD: Yes  The ASCVD Risk score (Arnett DK, et al., 2019) failed to calculate for the following reasons:   The valid total cholesterol range is 130 to 320 mg/dL       03/10/2021    2:06 PM 03/05/2020   11:48 AM 07/03/2018   12:06 PM  Depression screen PHQ 2/9  Decreased Interest 0 1 0  Down, Depressed, Hopeless 0 1 0  PHQ - 2 Score 0 2 0  Altered sleeping  0 0  Tired, decreased energy  0 0  Change in appetite  0 0  Feeling bad or failure about yourself   0 0  Trouble concentrating  0 0  Moving slowly or fidgety/restless  0 0  Suicidal thoughts  0 0  PHQ-9 Score  2 0  Difficult doing work/chores  Not difficult at all Not difficult at all     Social History   Tobacco Use  Smoking Status Former   Packs/day: 1.00   Years: 30.00   Pack years: 30.00   Types: Cigarettes   Quit date: 01/18/1992   Years since quitting: 29.4   Passive exposure: Never  Smokeless Tobacco Never   BP Readings from Last 3 Encounters:  04/21/21 140/84  03/08/21 132/80  03/06/21 (!) 149/99   Pulse Readings from Last 3 Encounters:  04/21/21 71  03/08/21 90  03/06/21 75   Wt Readings from Last 3 Encounters:  04/21/21 227 lb 6 oz (103.1 kg)  03/10/21 229 lb (103.9 kg)  03/08/21 229 lb (103.9 kg)   BMI Readings from Last 3 Encounters:  04/21/21 32.16 kg/m  03/10/21 31.94 kg/m  03/08/21 31.94 kg/m    Assessment/Interventions: Review of patient past medical history, allergies, medications, health status, including review of consultants reports, laboratory and other test data, was performed as part of comprehensive evaluation and provision of chronic care management services.   SDOH:  (Social Determinants of Health) assessments and interventions performed: {yes/no:20286}  SDOH Screenings    Alcohol Screen: Low Risk    Last Alcohol Screening Score (AUDIT): 3  Depression (PHQ2-9): Low Risk    PHQ-2 Score: 0  Financial Resource Strain: Low Risk    Difficulty of Paying Living Expenses: Not hard at all  Food Insecurity: No Food Insecurity   Worried About Charity fundraiser in the Last Year: Never true   Ran Out of Food in the Last Year: Never true  Housing: [place]  Score: 0  Physical Activity: Inactive   Days of Exercise per Week: 0 days   Minutes of Exercise per Session: 0 min  Social Connections: Socially Isolated   Frequency of Communication with Friends and Family: More than three times a week   Frequency of Social Gatherings with Friends and Family: Three times a week   Attends Religious Services: Never   Active Member of Clubs or Organizations: No   Attends Archivist Meetings: Never   Marital Status: Widowed  Stress: No Stress Concern Present   Feeling of Stress : Only a little  Tobacco Use: Medium Risk   Smoking Tobacco Use: Former   Smokeless Tobacco Use: Never   Passive Exposure: Never  Transportation Needs: No Data processing manager (Medical): No   Lack of Transportation (Non-Medical): No    CCM Care Plan  Allergies  Allergen Reactions   Iodine Hives    Contrast dye/ betadine ok    Medications Reviewed Today     Reviewed by Charlton Haws, [facility] (Pharmacist) on 06/07/21 at 1122  Med List Status: <None>   Medication Order Taking? Sig Documenting Provider Last Dose Status Informant  acetaminophen (TYLENOL) 650 MG CR tablet [id number] Yes Take 650 mg by mouth 2 (two) times daily. [provider] Taking Active   ALLERGY RELIEF 180 MG tablet [id number] Yes TAKE ONE TABLET DAILY FOR CONGESTION. Ria Bush, MD Taking Active   ALPRAZolam (XANAX XR) 0.5 MG 24 hr tablet [id number] Yes Take 1 tablet (0.5 mg total) by mouth daily. Ria Bush, MD Taking Active   atorvastatin  (LIPITOR) 40 MG tablet [id number] Yes Take 1 tablet (40 mg total) by mouth daily at 6 PM. Rockey Situ, Kathlene November, MD Taking Active   buPROPion (WELLBUTRIN XL) 150 MG 24 hr tablet [id number] Yes Take 1 tablet (150 mg total) by mouth daily. Ria Bush, MD Taking Active   cloNIDine (CATAPRES) 0.1 MG tablet [id number] Yes Take 1 tablet (0.1 mg total) by mouth 2 (two) times daily. Minna Merritts, MD Taking Active   ezetimibe (ZETIA) 10 MG tablet [id number] Yes Take 1 tablet (10 mg total) by mouth daily. Minna Merritts, MD Taking Active   meclizine (ANTIVERT) 25 MG tablet [id number] Yes Take 25 mg by mouth 3 (three) times daily as needed for dizziness. Reported on 07/01/2015 [provider] Taking Active Self           Med Note Jilda Roche A   Mon Aug 01, 2016 12:51 PM)    metoprolol tartrate (LOPRESSOR) 25 MG tablet [id number] Yes Take 1 tablet (25 mg total) by mouth 2 (two) times daily. Minna Merritts, MD Taking Active   Multiple Vitamin (MULTIVITAMIN PO) [id number] Yes Take 1 tablet by mouth daily. pm [provider] Taking Active Self  omeprazole (PRILOSEC) 40 MG capsule [id number] Yes Take 1 capsule (40 mg total) by mouth daily. Minna Merritts, MD Taking Active   warfarin (COUMADIN) 5 MG tablet [id number] Yes Take 1/2 a tablet to 1 tablet by mouth daily or as directed by the coumadin clinic. Minna Merritts, MD Taking Active             Patient Active Problem List   Diagnosis Date Noted   Chronic venous insufficiency 03/08/2021   Cellulitis of left leg 02/19/2021   Caregiver role strain 12/21/2018   Hearing loss due to cerumen impaction, right 10/18/2018   Abdominal aortic atherosclerosis ([place]) 09/17/2018   AAA (abdominal aortic aneurysm) without  rupture ([place]) 09/17/2018   Mass of pancreas 09/17/2018   Advanced care planning/counseling discussion 06/29/2017   Long-term use of high-risk medication 01/28/2017   Persistent atrial fibrillation ([place]) 12/10/2016    Obesity, Class I, BMI 30.0-34.9 (see actual BMI) 09/09/2014   Renal insufficiency 06/23/2014   Encounter for therapeutic drug monitoring 03/20/2013   Sleep apnea 09/05/2012   Allergic rhinitis 03/22/2012   Anxiety 02/04/2011   Long term current use of anticoagulant 04/06/2010   History of DVT of lower extremity 07/18/2008   SKIN CANCER, HX OF 10/30/2007   ERECTILE DYSFUNCTION 04/30/2007   COLONIC POLYPS 2/08 07/12/2006   HYPERCHOLESTEROLEMIA 04/20/2006   MDD (major depressive disorder), recurrent episode, moderate ([place]) 04/20/2006   Essential hypertension 04/20/2006    Immunization History  Administered Date(s) Administered   Fluad Quad(high Dose 65+) 10/18/2018, 10/30/2019   Influenza Split 10/31/2011   Influenza Whole 10/30/2007, 10/28/2008, 10/27/2009   Influenza,inj,Quad PF,6+ Mos 11/13/2013, 10/14/2014, 10/01/2015, 01/12/2017, 11/09/2017   Influenza-Unspecified 09/17/2020   Moderna Sars-Covid-2 Vaccination 03/06/2019, 04/03/2019, 11/21/2019   Pneumococcal Conjugate-13 11/13/2013   Pneumococcal Polysaccharide-23 03/23/2011   Td 08/03/2000, 09/16/2008   Zoster, Live 10/28/2008    Conditions to be addressed/monitored:  Hypertension, Hyperlipidemia, Atrial Fibrillation, Coronary Artery Disease, and Depression  Care Plan : [place]  Updates made by Charlton Haws, [place] since 06/07/2021 12:00 AM     Problem: Hypertension, Hyperlipidemia, Atrial Fibrillation, Coronary Artery Disease, and Depression   Priority: High     Long-Range Goal: Disease Management   Start Date: 05/20/2020  Expected End Date: 06/08/2022  This Visit's Progress: On track  Priority: High  Note:   Current Barriers:  None identified  Pharmacist Clinical Goal(s):  Patient will contact provider office for questions/concerns as evidenced notation of same in electronic health record through collaboration with PharmD and provider.   Interventions: 1:1 collaboration with Ria Bush, MD regarding development and update of comprehensive plan of care as evidenced by provider attestation and co-signature Inter-disciplinary care team collaboration (see longitudinal plan of care) Comprehensive medication review performed; medication list updated in electronic medical record  Hypertension (BP goal <140/90) -Controlled - per home and clinic readings; wears compression socks for LE swelling, did not use furosemide -Current home readings: none available -recent Hx chronic venous insufficency -Current treatment: Clonidine 0.1 mg BID -Appropriate, Effective, Safe, Accessible Metoprolol tartrate 25 mg BID -Appropriate, Effective, Safe, Accessible Furosemide 20 mg daily PRN - not using. Removed from list. -Medications previously tried: losartan -Pertinent history: per cardio note 2017 - pt was on losartan and metoprolol, clonidine added, avoid CCB due to LLE, avoid diuretics due to renal impairment; 2018 - losartan was discontinued due to decreased GFR, continue metoprolol and clonidine -Educated on BP goals and benefits of medications for prevention of heart attack, stroke and kidney damage; -Counseled to monitor BP at home monthly or with symptoms of hypotension -Recommended to continue current medication  Hyperlipidemia: (LDL goal < 70) -Controlled - LDL 58 (02/2020) at goal -Hx aortic atherosclerosis (CT 2020) -Current treatment: Atorvastatin 40 mg daily - Appropriate, Effective, Safe, Accessible Ezetimibe 10 mg daily -Appropriate, Effective, Safe, Accessible -Medications previously tried: none -Educated on Cholesterol goals;  -Recommended to continue current medication  Atrial Fibrillation (Goal: prevent stroke and major bleeding) -Controlled - per cardio  -History of DVT RLE -Patient denies any recent changes in warfarin dose, INR has been stable. Following every 6 weeks with cardiology clinic. Denies abnormal bruising or bleeding -Current treatment: Metoprolol  tartrate 25 mg  BID -Appropriate, Effective, Safe, Accessible Warfarin 5 mg as directed -Appropriate, Effective, Safe, Accessible -Medications previously tried: none - declines interest in NOAC -Counseled on bleeding risk associated with warfarin and importance of self-monitoring for signs/symptoms of bleeding; avoidance of NSAIDs due to increased bleeding risk with anticoagulants; -Recommended to continue current medication  Depression/Anxiety (Goal: improve mood) -Controlled - per patient medications are working well -Patient reports less interest in usual activities since wife passed in February 2022 -Current treatment: Alprazolam XR 0.5 mg daily -Appropriate, Effective, Safe, Accessible Bupropion 150 mg daily -Appropriate, Effective, Safe, Accessible -Medications previously tried/failed: none reported -Recommended to continue current medication  Patient Goals/Self-Care Activities Patient will:  - take medications as prescribed as evidenced by patient report and record review focus on medication adherence by routine check blood pressure periodically, document, and provide at future appointments      Medication Assistance: None required.  Patient affirms current coverage meets needs.  Compliance/Adherence/Medication fill history: Care Gaps: None  Star-Rating Drugs: Atorvastatin - PDC 98%  Medication Access: Within the past 30 days, how often has patient missed a dose of medication? 0 Is a pillbox or other method used to improve adherence? Yes  Factors that may affect medication adherence? no barriers identified Are meds synced by current pharmacy? No  Are meds delivered by current pharmacy? No  Does patient experience delays in picking up medications due to transportation concerns? No   [facility] Services Reviewed: Is patient disadvantaged to use [facility]?: No  Current Rx insurance plan: [place] Name and location of Current pharmacy:  [address], [address] [place] [address] Phone: [phone] Fax: [phone]  [facility] services reviewed with patient today?: No  Patient requests to transfer care to [facility]?: No  Reason patient declined to change pharmacies: Loyalty to other pharmacy/Patient preference   Care Plan and Follow Up Patient Decision:  Patient agrees to Care Plan and Follow-up.  Plan: Telephone follow up appointment with care management team member scheduled for:  1 year  Charlene Brooke, PharmD, [facility] Clinical Pharmacist [facility] [phone]

## 2021-06-08 ENCOUNTER — Other Ambulatory Visit: Payer: Self-pay | Admitting: Family Medicine

## 2021-06-08 NOTE — Patient Instructions (Signed)
Visit Information  Phone number for Pharmacist: [phone]   Goals Addressed   None     Patient Care Plan: CCM Pharmacy Care Plan     Problem Identified: Hypertension, Hyperlipidemia, Atrial Fibrillation, Coronary Artery Disease, and Depression   Priority: High     Long-Range Goal: Disease Management   Start Date: 05/20/2020  Expected End Date: 06/08/2022  This Visit's Progress: On track  Priority: High  Note:   Current Barriers:  None identified  Pharmacist Clinical Goal(s):  Patient will contact provider office for questions/concerns as evidenced notation of same in electronic health record through collaboration with PharmD and provider.   Interventions: 1:1 collaboration with Ria Bush, MD regarding development and update of comprehensive plan of care as evidenced by provider attestation and co-signature Inter-disciplinary care team collaboration (see longitudinal plan of care) Comprehensive medication review performed; medication list updated in electronic medical record  Hypertension (BP goal <140/90) -Controlled - per home and clinic readings; wears compression socks for LE swelling, did not use furosemide -Current home readings: none available -recent Hx chronic venous insufficency -Current treatment: Clonidine 0.1 mg BID -Appropriate, Effective, Safe, Accessible Metoprolol tartrate 25 mg BID -Appropriate, Effective, Safe, Accessible Furosemide 20 mg daily PRN - not using. Removed from list. -Medications previously tried: losartan -Pertinent history: per cardio note 2017 - pt was on losartan and metoprolol, clonidine added, avoid CCB due to LLE, avoid diuretics due to renal impairment; 2018 - losartan was discontinued due to decreased GFR, continue metoprolol and clonidine -Educated on BP goals and benefits of medications for prevention of heart attack, stroke and kidney damage; -Counseled to monitor BP at home monthly or with symptoms of  hypotension -Recommended to continue current medication  Hyperlipidemia: (LDL goal < 70) -Controlled - LDL 58 (02/2020) at goal -Hx aortic atherosclerosis (CT 2020) -Current treatment: Atorvastatin 40 mg daily - Appropriate, Effective, Safe, Accessible Ezetimibe 10 mg daily -Appropriate, Effective, Safe, Accessible -Medications previously tried: none -Educated on Cholesterol goals;  -Recommended to continue current medication  Atrial Fibrillation (Goal: prevent stroke and major bleeding) -Controlled - per cardio  -History of DVT RLE -Patient denies any recent changes in warfarin dose, INR has been stable. Following every 6 weeks with cardiology clinic. Denies abnormal bruising or bleeding -Current treatment: Metoprolol tartrate 25 mg BID -Appropriate, Effective, Safe, Accessible Warfarin 5 mg as directed -Appropriate, Effective, Safe, Accessible -Medications previously tried: none - declines interest in NOAC -Counseled on bleeding risk associated with warfarin and importance of self-monitoring for signs/symptoms of bleeding; avoidance of NSAIDs due to increased bleeding risk with anticoagulants; -Recommended to continue current medication  Depression/Anxiety (Goal: improve mood) -Controlled - per patient medications are working well -Patient reports less interest in usual activities since wife passed in February 2022 -Current treatment: Alprazolam XR 0.5 mg daily -Appropriate, Effective, Safe, Accessible Bupropion 150 mg daily -Appropriate, Effective, Safe, Accessible -Medications previously tried/failed: none reported -Recommended to continue current medication  Patient Goals/Self-Care Activities Patient will:  - take medications as prescribed as evidenced by patient report and record review focus on medication adherence by routine check blood pressure periodically, document, and provide at future appointments      Patient verbalizes understanding of instructions and care  plan provided today and agrees to view in [place]. Active MyChart status and patient understanding of how to access instructions and care plan via MyChart confirmed with patient.    Telephone follow up appointment with pharmacy team member scheduled for: 1 year  Charlene Brooke, PharmD, [facility] Clinical Pharmacist [place]  Primary Care at [place]

## 2021-06-09 ENCOUNTER — Ambulatory Visit (INDEPENDENT_AMBULATORY_CARE_PROVIDER_SITE_OTHER): Payer: Medicare Other

## 2021-06-09 ENCOUNTER — Other Ambulatory Visit: Payer: Self-pay | Admitting: Cardiovascular Disease

## 2021-06-09 DIAGNOSIS — Z86718 Personal history of other venous thrombosis and embolism: Secondary | ICD-10-CM | POA: Diagnosis not present

## 2021-06-09 DIAGNOSIS — Z5181 Encounter for therapeutic drug level monitoring: Secondary | ICD-10-CM | POA: Diagnosis not present

## 2021-06-09 LAB — POCT INR: INR: 1.4 — AB (ref 2.0–3.0)

## 2021-06-09 NOTE — Patient Instructions (Signed)
-   take 2 tablets tonight only then - INCREASE TO 1 tablet warfarin daily  - recheck in 2 weeks

## 2021-06-14 ENCOUNTER — Other Ambulatory Visit: Payer: Self-pay | Admitting: Family Medicine

## 2021-06-15 NOTE — Telephone Encounter (Signed)
Name of Medication: Alprazolam Name of Pharmacy: [facility] or Written Date and Quantity: 05/17/21, #30 Last Office Visit and Type: 03/11/20, CPE Next Office Visit and Type: 07/14/21, CPE Last Controlled Substance Agreement Date: 01/24/17 Last UDS: 01/24/17

## 2021-06-16 DIAGNOSIS — I10 Essential (primary) hypertension: Secondary | ICD-10-CM

## 2021-06-16 DIAGNOSIS — E785 Hyperlipidemia, unspecified: Secondary | ICD-10-CM | POA: Diagnosis not present

## 2021-06-16 DIAGNOSIS — Z87891 Personal history of nicotine dependence: Secondary | ICD-10-CM

## 2021-06-16 DIAGNOSIS — F32A Depression, unspecified: Secondary | ICD-10-CM | POA: Diagnosis not present

## 2021-06-16 DIAGNOSIS — I4891 Unspecified atrial fibrillation: Secondary | ICD-10-CM

## 2021-06-16 NOTE — Telephone Encounter (Signed)
ERx 

## 2021-06-23 ENCOUNTER — Ambulatory Visit (INDEPENDENT_AMBULATORY_CARE_PROVIDER_SITE_OTHER): Payer: Medicare Other

## 2021-06-23 DIAGNOSIS — Z5181 Encounter for therapeutic drug level monitoring: Secondary | ICD-10-CM

## 2021-06-23 DIAGNOSIS — Z86718 Personal history of other venous thrombosis and embolism: Secondary | ICD-10-CM | POA: Diagnosis not present

## 2021-06-23 LAB — POCT INR: INR: 1.6 — AB (ref 2.0–3.0)

## 2021-06-23 NOTE — Patient Instructions (Signed)
-   take 2 tablets tonight only then - Continue 1 tablet warfarin daily  - recheck in 2 weeks

## 2021-06-30 DIAGNOSIS — H903 Sensorineural hearing loss, bilateral: Secondary | ICD-10-CM | POA: Diagnosis not present

## 2021-06-30 DIAGNOSIS — H6123 Impacted cerumen, bilateral: Secondary | ICD-10-CM | POA: Diagnosis not present

## 2021-07-06 ENCOUNTER — Other Ambulatory Visit: Payer: Self-pay | Admitting: Family Medicine

## 2021-07-07 ENCOUNTER — Ambulatory Visit (INDEPENDENT_AMBULATORY_CARE_PROVIDER_SITE_OTHER): Payer: Medicare Other

## 2021-07-07 DIAGNOSIS — Z86718 Personal history of other venous thrombosis and embolism: Secondary | ICD-10-CM | POA: Diagnosis not present

## 2021-07-07 DIAGNOSIS — Z5181 Encounter for therapeutic drug level monitoring: Secondary | ICD-10-CM | POA: Diagnosis not present

## 2021-07-07 LAB — POCT INR: INR: 2 (ref 2.0–3.0)

## 2021-07-07 NOTE — Patient Instructions (Signed)
-   Continue 1 tablet warfarin daily  - recheck in 6 weeks

## 2021-07-08 DIAGNOSIS — H18513 Endothelial corneal dystrophy, bilateral: Secondary | ICD-10-CM | POA: Diagnosis not present

## 2021-07-09 ENCOUNTER — Other Ambulatory Visit: Payer: Self-pay | Admitting: Family Medicine

## 2021-07-14 ENCOUNTER — Encounter: Payer: Self-pay | Admitting: Family Medicine

## 2021-07-14 ENCOUNTER — Ambulatory Visit (INDEPENDENT_AMBULATORY_CARE_PROVIDER_SITE_OTHER): Payer: Medicare Other | Admitting: Family Medicine

## 2021-07-14 VITALS — BP 132/84 | HR 62 | Temp 98.0°F | Ht 69.25 in | Wt 223.2 lb

## 2021-07-14 DIAGNOSIS — I700 Atherosclerosis of aorta: Secondary | ICD-10-CM | POA: Diagnosis not present

## 2021-07-14 DIAGNOSIS — Z7901 Long term (current) use of anticoagulants: Secondary | ICD-10-CM | POA: Diagnosis not present

## 2021-07-14 DIAGNOSIS — E7800 Pure hypercholesterolemia, unspecified: Secondary | ICD-10-CM

## 2021-07-14 DIAGNOSIS — I10 Essential (primary) hypertension: Secondary | ICD-10-CM

## 2021-07-14 DIAGNOSIS — I4819 Other persistent atrial fibrillation: Secondary | ICD-10-CM | POA: Diagnosis not present

## 2021-07-14 DIAGNOSIS — I714 Abdominal aortic aneurysm, without rupture, unspecified: Secondary | ICD-10-CM | POA: Diagnosis not present

## 2021-07-14 DIAGNOSIS — Z7189 Other specified counseling: Secondary | ICD-10-CM | POA: Diagnosis not present

## 2021-07-14 DIAGNOSIS — Z8042 Family history of malignant neoplasm of prostate: Secondary | ICD-10-CM

## 2021-07-14 DIAGNOSIS — Z125 Encounter for screening for malignant neoplasm of prostate: Secondary | ICD-10-CM

## 2021-07-14 DIAGNOSIS — F331 Major depressive disorder, recurrent, moderate: Secondary | ICD-10-CM | POA: Diagnosis not present

## 2021-07-14 DIAGNOSIS — I872 Venous insufficiency (chronic) (peripheral): Secondary | ICD-10-CM

## 2021-07-14 DIAGNOSIS — E669 Obesity, unspecified: Secondary | ICD-10-CM

## 2021-07-14 DIAGNOSIS — N289 Disorder of kidney and ureter, unspecified: Secondary | ICD-10-CM

## 2021-07-14 NOTE — Progress Notes (Signed)
Patient ID: Anthony Favorite., male    DOB: 04-Jan-1946, 76 y.o.   MRN: [id number]  This visit was conducted in person.  BP 132/84   Pulse 62   Temp 98 F (36.7 C) (Temporal)   Ht 5' 9.25" (1.759 m)   Wt 223 lb 4 oz (101.3 kg)   SpO2 97%   BMI 32.73 kg/m    CC: AMW f/u visit  Subjective:   HPI: Anthony Kutzer. is a 76 y.o. male presenting on 07/14/2021 for Annual Exam [facility] prt 2. )   Saw health advisor 03-11-21 for medicare wellness visit. Note reviewed.    No results found.  Flowsheet Row Clinical Support from 03/10/2021 in [place] [facility]  PHQ-2 Total Score 0          03/10/2021    2:02 PM 03/05/2020   11:48 AM 07/03/2018   12:06 PM 06/22/2017   10:15 AM 06/23/2014    8:33 AM  Fall Risk   Falls in the past year? 0 0 0 No No  Number falls in past yr: 0 0     Injury with Fall? 0 0     Risk for fall due to : No Fall Risks Medication side effect     Follow up Falls prevention discussed Falls evaluation completed;Falls prevention discussed      Ongoing atrial fibrillation on coumadin, metoprolol, clonidine.   Notes some memory difficulty - forgets things ie did he shut garage. Lives alone, independent in ADLs/IADLs.   Asks about arterial insufficiency as he was previously told he had weak pedal pulses. Denies claudication sxs. Regularly uses compression socks  Preventative: Colonoscopy 2013 WNL, rpt 5 yrs --> changed to 10 yrs Anthony Ina) will be due this year 2023 ([name] - has received letter Prostate cancer screening - had seen urology with PSA/DRE (Anthony Kim). Father with h/o prostate cancer s/p brachytherapy with good effect. Has not returned to uro.  Lung cancer screening - not eligible Flu shot -yearly COVID vaccine Moderna 03/12/2019, 03/2019, booster 11/2019 (states he didn't have this) Td 2010 Pneumovax 2013, [id number]-[id number] 2015 Zostavax - 2010 Shingrix - discussed Advanced directive discussion - wife was HCPOA but has passed away. Has  advanced directives at home - asked to bring [place] copy. Desires CPR. Would not want life support if recovery futile.  Unsure about feeding tube. Working on this.  Seat belt use discussed Sunscreen use discussed. No changing moles on skin.  Ex - smoker - 30+ PY hx, quit cold [place] 1990s Alcohol - 1-2 glasses wine 3d/wk  Dentist - q6 mo Eye exam - yearly. Had cataract surgery. Discussing corneal transplant.  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 Interim medical history since our last visit reviewed. Allergies and medications reviewed and updated. Outpatient Medications Prior to Visit  Medication Sig Dispense Refill   acetaminophen (TYLENOL) 650 MG CR tablet Take 650 mg by mouth 2 (two) times daily.     atorvastatin (LIPITOR) 40 MG tablet Take 1 tablet (40 mg total) by mouth daily at 6 PM. 90 tablet 2   cloNIDine (CATAPRES) 0.1 MG tablet Take 1 tablet (0.1 mg total) by mouth  2 (two) times daily. 60 tablet 4   ezetimibe (ZETIA) 10 MG tablet Take 1 tablet (10 mg total) by mouth daily. 90 tablet 0   meclizine (ANTIVERT) 25 MG tablet Take 25 mg by mouth 3 (three) times daily as needed for dizziness. Reported on 07/01/2015     metoprolol tartrate (LOPRESSOR) 25 MG tablet Take 1 tablet (25 mg total) by mouth 2 (two) times daily. 180 tablet 3   Multiple Vitamin (MULTIVITAMIN PO) Take 1 tablet by mouth daily. pm     omeprazole (PRILOSEC) 40 MG capsule Take 1 capsule (40 mg total) by mouth daily. 90 capsule 3   warfarin (COUMADIN) 5 MG tablet Take 1/2 a tablet to 1 tablet by mouth daily or as directed by the coumadin clinic. 40 tablet 1   ALLERGY RELIEF 180 MG tablet TAKE ONE  TABLET DAILY FOR CONGESTION. 30 tablet 0   ALPRAZolam (XANAX XR) 0.5 MG 24 hr tablet Take 1 tablet (0.5 mg total) by mouth daily. 30 tablet 0   buPROPion (WELLBUTRIN XL) 150 MG 24 hr tablet Take 1 tablet (150 mg total) by mouth daily. 90 tablet 0   No facility-administered medications prior to visit.     Per HPI unless specifically indicated in ROS section below Review of Systems  Objective:  BP 132/84   Pulse 62   Temp 98 F (36.7 C) (Temporal)   Ht 5' 9.25" (1.759 m)   Wt 223 lb 4 oz (101.3 kg)   SpO2 97%   BMI 32.73 kg/m   Wt Readings from Last 3 Encounters:  07/14/21 223 lb 4 oz (101.3 kg)  04/21/21 227 lb 6 oz (103.1 kg)  03/10/21 229 lb (103.9 kg)      Physical Exam Vitals and nursing note reviewed.  Constitutional:      General: He is not in acute distress.    Appearance: Normal appearance. He is well-developed. He is not ill-appearing.  HENT:     Head: Normocephalic and atraumatic.     Right Ear: Hearing, tympanic membrane, ear canal and external ear normal.     Left Ear: Hearing, tympanic membrane, ear canal and external ear normal.  Eyes:     General: No scleral icterus.    Extraocular Movements: Extraocular movements intact.     Conjunctiva/sclera: Conjunctivae normal.     Pupils: Pupils are equal, round, and reactive to light.  Neck:     Thyroid: No thyroid mass or thyromegaly.     Vascular: No carotid bruit.  Cardiovascular:     Rate and Rhythm: Normal rate and regular rhythm.     Pulses: Normal pulses.          Radial pulses are 2+ on the right side and 2+ on the left side.     Heart sounds: Normal heart sounds. No murmur heard. Pulmonary:     Effort: Pulmonary effort is normal. No respiratory distress.     Breath sounds: Normal breath sounds. No wheezing, rhonchi or rales.  Abdominal:     General: Bowel sounds are normal. There is no distension.     Palpations: Abdomen is soft. There is no mass.     Tenderness: There is no abdominal tenderness.  There is no guarding or rebound.     Hernia: No hernia is present.  Musculoskeletal:        General: Normal range of motion.     Cervical back: Normal range of motion and neck supple.     Right lower leg: No  edema.     Left lower leg: No edema.     Comments: 1+ DP bilaterally  Lymphadenopathy:     Cervical: No cervical adenopathy.  Skin:    General: Skin is warm and dry.     Findings: No rash.  Neurological:     General: No focal deficit present.     Mental Status: He is alert and oriented to person, place, and time.  Psychiatric:        Mood and Affect: Mood normal.        Behavior: Behavior normal.        Thought Content: Thought content normal.        Judgment: Judgment normal.       Results for orders placed or performed in visit on 07/14/21  Lipid panel  Result Value Ref Range   Cholesterol 132 0 - 200 mg/dL   Triglycerides 115.0 0.0 - 149.0 mg/dL   HDL 49.50 >39.00 mg/dL   VLDL 23.0 0.0 - 40.0 mg/dL   LDL Cholesterol 59 0 - 99 mg/dL   Total CHOL/HDL Ratio 3    NonHDL 82.41   Comprehensive metabolic panel  Result Value Ref Range   Sodium 139 135 - 145 mEq/L   Potassium 4.3 3.5 - 5.1 mEq/L   Chloride 103 96 - 112 mEq/L   CO2 29 19 - 32 mEq/L   Glucose, Bld 95 70 - 99 mg/dL   BUN 22 6 - 23 mg/dL   Creatinine, Ser 1.17 0.40 - 1.50 mg/dL   Total Bilirubin 1.2 0.2 - 1.2 mg/dL   Alkaline Phosphatase 63 39 - 117 U/L   AST 21 0 - 37 U/L   ALT 23 0 - 53 U/L   Total Protein 6.8 6.0 - 8.3 g/dL   Albumin 4.4 3.5 - 5.2 g/dL   GFR 60.33 >60.00 mL/min   Calcium 9.1 8.4 - 10.5 mg/dL  CBC with Differential/Platelet  Result Value Ref Range   WBC 6.4 4.0 - 10.5 K/uL   RBC 4.59 4.22 - 5.81 Mil/uL   Hemoglobin 14.8 13.0 - 17.0 g/dL   HCT 44.2 39.0 - 52.0 %   MCV 96.3 78.0 - 100.0 fl   MCHC 33.5 30.0 - 36.0 g/dL   RDW 13.3 11.5 - 15.5 %   Platelets 147.0 (L) 150.0 - 400.0 K/uL   Neutrophils Relative % 50.6 43.0 - 77.0 %   Lymphocytes Relative 38.2 12.0 - 46.0 %    Monocytes Relative 7.6 3.0 - 12.0 %   Eosinophils Relative 2.3 0.0 - 5.0 %   Basophils Relative 1.3 0.0 - 3.0 %   Neutro Abs 3.2 1.4 - 7.7 K/uL   Lymphs Abs 2.4 0.7 - 4.0 K/uL   Monocytes Absolute 0.5 0.1 - 1.0 K/uL   Eosinophils Absolute 0.1 0.0 - 0.7 K/uL   Basophils Absolute 0.1 0.0 - 0.1 K/uL  Microalbumin / creatinine urine ratio  Result Value Ref Range   Microalb, Ur 1.0 0.0 - 1.9 mg/dL   Creatinine,U 140.0 mg/dL   Microalb Creat Ratio 0.7 0.0 - 30.0 mg/g  PSA, Medicare  Result Value Ref Range   PSA 0.28 0.10 - 4.00 ng/ml   Lab Results  Component Value Date   PSA 0.28 07/14/2021   PSA 0.34 03/11/2020   PSA 0.56 10/18/2018      03/10/2021    2:06 PM 03/05/2020   11:48 AM 07/03/2018   12:06 PM 06/22/2017   10:15 AM 09/29/2015   11:45 AM  Depression screen  PHQ 2/9  Decreased Interest 0 1 0 0 0  Down, Depressed, Hopeless 0 1 0 0 0  PHQ - 2 Score 0 2 0 0 0  Altered sleeping  0 0 0   Tired, decreased energy  0 0 0   Change in appetite  0 0 0   Feeling bad or failure about yourself   0 0 0   Trouble concentrating  0 0 0   Moving slowly or fidgety/restless  0 0 0   Suicidal thoughts  0 0 0   PHQ-9 Score  2 0 0   Difficult doing work/chores  Not difficult at all Not difficult at all Not difficult at all         No data to display         Assessment & Plan:   Problem List Items Addressed This Visit     Advanced care planning/counseling discussion - Primary (Chronic)    Advanced directive discussion - wife was HCPOA but has passed away. Has advanced directives at home - asked to bring [place] copy. Desires CPR. Would not want life support if recovery futile.  Unsure about feeding tube. Working on this.       HYPERCHOLESTEROLEMIA    Chronic - update FLP on zetia and atorvastatin. The 10-year ASCVD risk score (Arnett DK, et al., 2019) is: 30.3%   Values used to calculate the score:     Age: 66 years     Sex: Male     Is Non-Hispanic African American: No     Diabetic:  No     Tobacco smoker: No     Systolic Blood Pressure: 433 mmHg     Is BP treated: Yes     HDL Cholesterol: 49.5 mg/dL     Total Cholesterol: 132 mg/dL       Relevant Orders   Lipid panel (Completed)   Comprehensive metabolic panel (Completed)   MDD (major depressive disorder), recurrent episode, moderate (HCC)    Stable period on wellbutrin and xanax       Relevant Medications   ALPRAZolam (XANAX XR) 0.5 MG 24 hr tablet   buPROPion (WELLBUTRIN XL) 150 MG 24 hr tablet   Essential hypertension    Adequate control on current regimen - continue.       Relevant Orders   Microalbumin / creatinine urine ratio (Completed)   Long term current use of anticoagulant    Continue coumadin in h/o DVT and afib.       Renal insufficiency    Update Cr.      Obesity, Class I, BMI 30.0-34.9 (see actual BMI)    Weight overall stable since last year.       Persistent atrial fibrillation (HCC)    Continue coumadin followed by cardiology coumadin clinic.       Relevant Orders   CBC with Differential/Platelet (Completed)   Abdominal aortic atherosclerosis (HCC)    Continue statin, zetia.       AAA (abdominal aortic aneurysm) without rupture ([place])    Due for repeat US - will order.       Relevant Orders   VAS [place] AAA DUPLEX   Chronic venous insufficiency    1+ DP pulses bilaterally      Family history of prostate cancer in father    Update PSA      Relevant Orders   PSA, Medicare (Completed)   Other Visit Diagnoses     Special screening for malignant neoplasm of prostate  Relevant Orders   PSA, Medicare (Completed)        Meds ordered this encounter  Medications   fexofenadine (ALLERGY RELIEF) 180 MG tablet    Sig: TAKE ONE TABLET DAILY FOR CONGESTION.    Dispense:  90 tablet    Refill:  3   ALPRAZolam (XANAX XR) 0.5 MG 24 hr tablet    Sig: Take 1 tablet (0.5 mg total) by mouth daily.    Dispense:  30 tablet    Refill:  2   buPROPion (WELLBUTRIN XL) 150 MG  24 hr tablet    Sig: Take 1 tablet (150 mg total) by mouth daily.    Dispense:  90 tablet    Refill:  3   Orders Placed This Encounter  Procedures   Lipid panel   Comprehensive metabolic panel   CBC with Differential/Platelet   Microalbumin / creatinine urine ratio   PSA, Medicare    Patient instructions: Labs today  Schedule GI appointment for follow up colonoscopy.  If interested, check with pharmacy about new 2 shot shingles series (shingrix).  If you note ongoing memory trouble, schedule appointment for formal memory testing.  Good to see you today Return as needed or after 03/10/2022 for medicare wellness visit.  Follow up plan: Return in about 1 year (around 07/15/2022) for medicare wellness visit.  Ria Bush, MD

## 2021-07-14 NOTE — Patient Instructions (Addendum)
Labs today  Schedule GI appointment for follow up colonoscopy.  If interested, check with pharmacy about new 2 shot shingles series (shingrix).  If you note ongoing memory trouble, schedule appointment for formal memory testing.  Good to see you today Return as needed or after 03/10/2022 for medicare wellness visit.   Health Maintenance After Age 77 After age 77, you are at a higher risk for certain long-term diseases and infections as well as injuries from falls. Falls are a major cause of broken bones and head injuries in people who are older than age 73. Getting regular preventive care can help to keep you healthy and well. Preventive care includes getting regular testing and making lifestyle changes as recommended by your health care provider. Talk with your health care provider about: Which screenings and tests you should have. A screening is a test that checks for a disease when you have no symptoms. A diet and exercise plan that is right for you. What should I know about screenings and tests to prevent falls? Screening and testing are the best ways to find a health problem early. Early diagnosis and treatment give you the best chance of managing medical conditions that are common after age 38. Certain conditions and lifestyle choices may make you more likely to have a fall. Your health care provider may recommend: Regular vision checks. Poor vision and conditions such as cataracts can make you more likely to have a fall. If you wear glasses, make sure to get your prescription updated if your vision changes. Medicine review. Work with your health care provider to regularly review all of the medicines you are taking, including over-the-counter medicines. Ask your health care provider about any side effects that may make you more likely to have a fall. Tell your health care provider if any medicines that you take make you feel dizzy or sleepy. Strength and balance checks. Your health care provider  may recommend certain tests to check your strength and balance while standing, walking, or changing positions. Foot health exam. Foot pain and numbness, as well as not wearing proper footwear, can make you more likely to have a fall. Screenings, including: Osteoporosis screening. Osteoporosis is a condition that causes the bones to get weaker and break more easily. Blood pressure screening. Blood pressure changes and medicines to control blood pressure can make you feel dizzy. Depression screening. You may be more likely to have a fall if you have a fear of falling, feel depressed, or feel unable to do activities that you used to do. Alcohol use screening. Using too much alcohol can affect your balance and may make you more likely to have a fall. Follow these instructions at home: Lifestyle Do not drink alcohol if: Your health care provider tells you not to drink. If you drink alcohol: Limit how much you have to: 0-1 drink a day for women. 0-2 drinks a day for men. Know how much alcohol is in your drink. In the [place] one drink equals one 12 oz bottle of beer (355 mL), one 5 oz glass of wine (148 mL), or one 1 oz glass of hard liquor (44 mL). Do not use any products that contain nicotine or tobacco. These products include cigarettes, chewing tobacco, and vaping devices, such as e-cigarettes. If you need help quitting, ask your health care provider. Activity  Follow a regular exercise program to stay fit. This will help you maintain your balance. Ask your health care provider what types of exercise are appropriate for  you. If you need a cane or walker, use it as recommended by your health care provider. Wear supportive shoes that have nonskid soles. Safety  Remove any tripping hazards, such as rugs, cords, and clutter. Install safety equipment such as grab bars in bathrooms and safety rails on stairs. Keep rooms and walkways well-lit. General instructions Talk with your health care  provider about your risks for falling. Tell your health care provider if: You fall. Be sure to tell your health care provider about all falls, even ones that seem minor. You feel dizzy, tiredness (fatigue), or off-balance. Take over-the-counter and prescription medicines only as told by your health care provider. These include supplements. Eat a healthy diet and maintain a healthy weight. A healthy diet includes low-fat dairy products, low-fat (lean) meats, and fiber from whole grains, beans, and lots of fruits and vegetables. Stay current with your vaccines. Schedule regular health, dental, and eye exams. Summary Having a healthy lifestyle and getting preventive care can help to protect your health and wellness after age 28. Screening and testing are the best way to find a health problem early and help you avoid having a fall. Early diagnosis and treatment give you the best chance for managing medical conditions that are more common for people who are older than age 20. Falls are a major cause of broken bones and head injuries in people who are older than age 38. Take precautions to prevent a fall at home. Work with your health care provider to learn what changes you can make to improve your health and wellness and to prevent falls. This information is not intended to replace advice given to you by your health care provider. Make sure you discuss any questions you have with your health care provider. Document Revised: 05/25/2020 Document Reviewed: 05/25/2020 Elsevier Patient Education  [place].

## 2021-07-14 NOTE — Assessment & Plan Note (Addendum)
Advanced directive discussion - wife was HCPOA. Has advanced directives at home - asked to bring [place] copy. Desires CPR. Would not want life support if recovery futile.  Unsure about feeding tube. Working on this.

## 2021-07-15 ENCOUNTER — Other Ambulatory Visit: Payer: Self-pay | Admitting: Family Medicine

## 2021-07-15 LAB — COMPREHENSIVE METABOLIC PANEL
ALT: 23 U/L (ref 0–53)
AST: 21 U/L (ref 0–37)
Albumin: 4.4 g/dL (ref 3.5–5.2)
Alkaline Phosphatase: 63 U/L (ref 39–117)
BUN: 22 mg/dL (ref 6–23)
CO2: 29 mEq/L (ref 19–32)
Calcium: 9.1 mg/dL (ref 8.4–10.5)
Chloride: 103 mEq/L (ref 96–112)
Creatinine, Ser: 1.17 mg/dL (ref 0.40–1.50)
GFR: 60.33 mL/min (ref >60.00)
Glucose, Bld: 95 mg/dL (ref 70–99)
Potassium: 4.3 mEq/L (ref 3.5–5.1)
Sodium: 139 mEq/L (ref 135–145)
Total Bilirubin: 1.2 mg/dL (ref 0.2–1.2)
Total Protein: 6.8 g/dL (ref 6.0–8.3)

## 2021-07-15 LAB — CBC WITH DIFFERENTIAL/PLATELET
Basophils Absolute: 0.1 10*3/uL (ref 0.0–0.1)
Basophils Relative: 1.3 % (ref 0.0–3.0)
Eosinophils Absolute: 0.1 10*3/uL (ref 0.0–0.7)
Eosinophils Relative: 2.3 % (ref 0.0–5.0)
HCT: 44.2 % (ref 39.0–52.0)
Hemoglobin: 14.8 g/dL (ref 13.0–17.0)
Lymphocytes Relative: 38.2 % (ref 12.0–46.0)
Lymphs Abs: 2.4 10*3/uL (ref 0.7–4.0)
MCHC: 33.5 g/dL (ref 30.0–36.0)
MCV: 96.3 fl (ref 78.0–100.0)
Monocytes Absolute: 0.5 10*3/uL (ref 0.1–1.0)
Monocytes Relative: 7.6 % (ref 3.0–12.0)
Neutro Abs: 3.2 10*3/uL (ref 1.4–7.7)
Neutrophils Relative %: 50.6 % (ref 43.0–77.0)
Platelets: 147 10*3/uL — ABNORMAL LOW (ref 150.0–400.0)
RBC: 4.59 Mil/uL (ref 4.22–5.81)
RDW: 13.3 % (ref 11.5–15.5)
WBC: 6.4 10*3/uL (ref 4.0–10.5)

## 2021-07-15 LAB — MICROALBUMIN / CREATININE URINE RATIO
Creatinine,U: 140 mg/dL
Microalb Creat Ratio: 0.7 mg/g (ref 0.0–30.0)
Microalb, Ur: 1 mg/dL (ref 0.0–1.9)

## 2021-07-15 LAB — LIPID PANEL
Cholesterol: 132 mg/dL (ref 0–200)
HDL: 49.5 mg/dL (ref >39.00)
LDL Cholesterol: 59 mg/dL (ref 0–99)
NonHDL: 82.41
Total CHOL/HDL Ratio: 3
Triglycerides: 115 mg/dL (ref 0.0–149.0)
VLDL: 23 mg/dL (ref 0.0–40.0)

## 2021-07-15 LAB — PSA, MEDICARE: PSA: 0.28 ng/ml (ref 0.10–4.00)

## 2021-07-15 MED ORDER — BUPROPION HCL ER (XL) 150 MG PO TB24: 150.0000 mg | ORAL_TABLET | Freq: Every day | ORAL | 3 refills | Status: DC

## 2021-07-15 MED ORDER — ALPRAZOLAM ER 0.5 MG PO TB24: 0.5000 mg | ORAL_TABLET | Freq: Every day | ORAL | 2 refills | Status: DC

## 2021-07-15 MED ORDER — FEXOFENADINE HCL 180 MG PO TABS: ORAL_TABLET | ORAL | 3 refills | Status: DC

## 2021-07-15 NOTE — Assessment & Plan Note (Addendum)
Chronic - update FLP on zetia and atorvastatin. The 10-year ASCVD risk score (Arnett DK, et al., 2019) is: 30.3%   Values used to calculate the score:     Age: 76 years     Sex: Male     Is Non-Hispanic African American: No     Diabetic: No     Tobacco smoker: No     Systolic Blood Pressure: 203 mmHg     Is BP treated: Yes     HDL Cholesterol: 49.5 mg/dL     Total Cholesterol: 132 mg/dL

## 2021-07-15 NOTE — Progress Notes (Incomplete)
Patient ID: Anthony Kim., male    DOB: April 27, 1945, 76 y.o.   MRN: [id number]  This visit was conducted in person.  BP 132/84   Pulse 62   Temp 98 F (36.7 C) (Temporal)   Ht 5' 9.25" (1.759 m)   Wt 223 lb 4 oz (101.3 kg)   SpO2 97%   BMI 32.73 kg/m    CC: AMW f/u visit  Subjective:   HPI: Anthony Kim. is a 76 y.o. male presenting on 07/14/2021 for Annual Exam [facility] prt 2. )   Saw health advisor 03/13/21 for medicare wellness visit. Note reviewed.    No results found.  Flowsheet Row Clinical Support from 03/10/2021 in [place] [facility] [place]  PHQ-2 Total Score 0          03/10/2021    2:02 PM 03/05/2020   11:48 AM 07/03/2018   12:06 PM 06/22/2017   10:15 AM 06/23/2014    8:33 AM  Fall Risk   Falls in the past year? 0 0 0 No No  Number falls in past yr: 0 0     Injury with Fall? 0 0     Risk for fall due to : No Fall Risks Medication side effect     Follow up Falls prevention discussed Falls evaluation completed;Falls prevention discussed      Ongoing atrial fibrillation on coumadin, metoprolol, clonidine.   Notes some memory difficulty - forgets things ie did he shut garage. Lives alone, independent in ADLs/IADLs.   Asks about arterial insufficiency as he was previously told he had weak pedal pulses. Denies claudication sxs. Regularly uses compression socks  Preventative: Colonoscopy 2013 WNL, rpt 5 yrs --> changed to 10 yrs Anthony Kim) will be due this year 2023 (Anthony Kim) - has received letter Prostate cancer screening - had seen urology with PSA/DRE (Anthony Kim). Father with h/o prostate cancer s/p brachytherapy with good effect. Has not returned to uro.  Lung cancer screening - not eligible Flu shot -yearly COVID vaccine Moderna 03-14-2019, 03/2019, booster 11/2019 (states he didn't have this) Td 2010 Pneumovax 2013, [id number] 2015 Zostavax - 2010 Shingrix - discussed Advanced directive discussion - wife was HCPOA but has passed away. Has  advanced directives at home - asked to bring [place] copy. Desires CPR. Would not want life support if recovery futile.  Unsure about feeding tube. Working on this.  Seat belt use discussed Sunscreen use discussed. No changing moles on skin.  Ex - smoker - 30+ PY hx, quit cold [place] 1990s Alcohol - 1-2 glasses wine 3d/wk  Dentist - q6 mo Eye exam - yearly. Had cataract surgery. Discussing corneal transplant.  Bowels - no constipation  Bladder - no incontinence   Widower - wife Anthony Kim passed away 03/13/20 (ALS) Occ: retired, owned Anthony Kim, also active on cattle farm Activity: no regular exercise recently  Diet: good water, G2 gatorade, fruits/vegetables daily      Relevant past medical, surgical, family and social history reviewed and updated as indicated. Interim medical history since our last visit reviewed. Allergies and medications reviewed and updated. Outpatient Medications Prior to Visit  Medication Sig Dispense Refill  . acetaminophen (TYLENOL) 650 MG CR tablet Take 650 mg by mouth 2 (two) times daily.    . ALLERGY RELIEF 180 MG tablet TAKE ONE TABLET DAILY FOR CONGESTION. 30 tablet 0  . ALPRAZolam (XANAX XR) 0.5 MG 24 hr tablet Take 1 tablet (0.5 mg total) by mouth daily. 30 tablet  0  . atorvastatin (LIPITOR) 40 MG tablet Take 1 tablet (40 mg total) by mouth daily at 6 PM. 90 tablet 2  . buPROPion (WELLBUTRIN XL) 150 MG 24 hr tablet Take 1 tablet (150 mg total) by mouth daily. 90 tablet 0  . cloNIDine (CATAPRES) 0.1 MG tablet Take 1 tablet (0.1 mg total) by mouth 2 (two) times daily. 60 tablet 4  . ezetimibe (ZETIA) 10 MG tablet Take 1 tablet (10 mg total) by mouth daily. 90 tablet 0  . meclizine (ANTIVERT) 25 MG tablet Take 25 mg by mouth 3 (three) times daily as needed for dizziness. Reported on 07/01/2015    . metoprolol tartrate (LOPRESSOR) 25 MG tablet Take 1 tablet (25 mg total) by mouth 2 (two) times daily. 180 tablet 3  . Multiple Vitamin (MULTIVITAMIN PO) Take  1 tablet by mouth daily. pm    . omeprazole (PRILOSEC) 40 MG capsule Take 1 capsule (40 mg total) by mouth daily. 90 capsule 3  . warfarin (COUMADIN) 5 MG tablet Take 1/2 a tablet to 1 tablet by mouth daily or as directed by the coumadin clinic. 40 tablet 1   No facility-administered medications prior to visit.     Per HPI unless specifically indicated in ROS section below Review of Systems  Objective:  BP 132/84   Pulse 62   Temp 98 F (36.7 C) (Temporal)   Ht 5' 9.25" (1.759 m)   Wt 223 lb 4 oz (101.3 kg)   SpO2 97%   BMI 32.73 kg/m   Wt Readings from Last 3 Encounters:  07/14/21 223 lb 4 oz (101.3 kg)  04/21/21 227 lb 6 oz (103.1 kg)  03/10/21 229 lb (103.9 kg)      Physical Exam Vitals and nursing note reviewed.  Constitutional:      General: He is not in acute distress.    Appearance: Normal appearance. He is well-developed. He is not ill-appearing.  HENT:     Head: Normocephalic and atraumatic.     Right Ear: Hearing, tympanic membrane, ear canal and external ear normal.     Left Ear: Hearing, tympanic membrane, ear canal and external ear normal.  Eyes:     General: No scleral icterus.    Extraocular Movements: Extraocular movements intact.     Conjunctiva/sclera: Conjunctivae normal.     Pupils: Pupils are equal, round, and reactive to light.  Neck:     Thyroid: No thyroid mass or thyromegaly.     Vascular: No carotid bruit.  Cardiovascular:     Rate and Rhythm: Normal rate and regular rhythm.     Pulses: Normal pulses.          Radial pulses are 2+ on the right side and 2+ on the left side.     Heart sounds: Normal heart sounds. No murmur heard. Pulmonary:     Effort: Pulmonary effort is normal. No respiratory distress.     Breath sounds: Normal breath sounds. No wheezing, rhonchi or rales.  Abdominal:     General: Bowel sounds are normal. There is no distension.     Palpations: Abdomen is soft. There is no mass.     Tenderness: There is no abdominal  tenderness. There is no guarding or rebound.     Hernia: No hernia is present.  Musculoskeletal:        General: Normal range of motion.     Cervical back: Normal range of motion and neck supple.     Right lower leg: No  edema.     Left lower leg: No edema.     Comments: 1+ DP bilaterally  Lymphadenopathy:     Cervical: No cervical adenopathy.  Skin:    General: Skin is warm and dry.     Findings: No rash.  Neurological:     General: No focal deficit present.     Mental Status: He is alert and oriented to person, place, and time.  Psychiatric:        Mood and Affect: Mood normal.        Behavior: Behavior normal.        Thought Content: Thought content normal.        Judgment: Judgment normal.       Results for orders placed or performed in visit on 07/07/21  POCT INR  Result Value Ref Range   INR 2.0 2.0 - 3.0   Lab Results  Component Value Date   PSA 0.34 03/11/2020   PSA 0.56 10/18/2018   PSA 0.49 06/22/2017     Assessment & Plan:   Problem List Items Addressed This Visit     Advanced care planning/counseling discussion - Primary (Chronic)    Advanced directive discussion - wife was HCPOA. Has advanced directives at home - asked to bring [place] copy. Desires CPR. Would not want life support if recovery futile.  Unsure about feeding tube. Working on this.       HYPERCHOLESTEROLEMIA   Relevant Orders   Lipid panel   Comprehensive metabolic panel   Essential hypertension   Relevant Orders   Microalbumin / creatinine urine ratio   Persistent atrial fibrillation (HCC)   Relevant Orders   CBC with Differential/Platelet   Family history of prostate cancer in father   Relevant Orders   PSA, Medicare   Other Visit Diagnoses     Special screening for malignant neoplasm of prostate       Relevant Orders   PSA, Medicare        No orders of the defined types were placed in this encounter.  Orders Placed This Encounter  Procedures  . Lipid panel  .  Comprehensive metabolic panel  . CBC with Differential/Platelet  . Microalbumin / creatinine urine ratio  . PSA, Medicare    Patient instructions: Labs today  Schedule GI appointment for follow up colonoscopy.  If interested, check with pharmacy about new 2 shot shingles series (shingrix).  If you note ongoing memory trouble, schedule appointment for formal memory testing.  Good to see you today Return as needed or after 03/10/2022 for medicare wellness visit.  Follow up plan: Return in about 1 year (around 07/15/2022) for medicare wellness visit.  Ria Bush, MD

## 2021-07-15 NOTE — Telephone Encounter (Signed)
Name of Medication: Alprazolam Name of Pharmacy: [facility] or Written Date and Quantity: 06/16/21, #30 Last Office Visit and Type: 07/14/21, CPE Next Office Visit and Type: none Last Controlled Substance Agreement Date: 01/24/17 Last UDS: 01/24/17

## 2021-07-16 ENCOUNTER — Encounter: Payer: Self-pay | Admitting: Family Medicine

## 2021-07-16 NOTE — Assessment & Plan Note (Signed)
Weight overall stable since last year.

## 2021-07-16 NOTE — Assessment & Plan Note (Addendum)
Continue coumadin in h/o DVT and afib.

## 2021-07-16 NOTE — Assessment & Plan Note (Signed)
Update PSA 

## 2021-07-16 NOTE — Assessment & Plan Note (Signed)
Stable period on wellbutrin and xanax

## 2021-07-16 NOTE — Assessment & Plan Note (Signed)
Due for repeat US - will order.

## 2021-07-16 NOTE — Assessment & Plan Note (Signed)
Continue coumadin - followed by cardiology coumadin clinic.  

## 2021-07-16 NOTE — Assessment & Plan Note (Signed)
Adequate control on current regimen - continue.

## 2021-07-16 NOTE — Assessment & Plan Note (Signed)
1+ DP pulses bilaterally

## 2021-07-16 NOTE — Assessment & Plan Note (Signed)
Update Cr.  

## 2021-07-16 NOTE — Assessment & Plan Note (Signed)
Continue statin, zetia.

## 2021-07-26 ENCOUNTER — Telehealth: Payer: Self-pay | Admitting: Cardiovascular Disease

## 2021-07-26 NOTE — Telephone Encounter (Signed)
Patient calling in with questions concerning medication he is suppose to take for  his procedure on 8/4. Please advise

## 2021-07-26 NOTE — Telephone Encounter (Signed)
Spoke with pt asking about med question.  States he was told by Dr. Donivan Scull office to take Gas-X Extra Strength cap the night before and morning before [place].  However, he cannot find capsules but was about to get chewable tablets.  Pt is asking if this form is ok.  He tried to contact Dr. Donivan Scull office to ask but could not get anyone. Gives permission to lvm.

## 2021-07-26 NOTE — Telephone Encounter (Signed)
I imaging this is for AAA vascular ultrasound.  Plz check with patient - what medicine is he calling about?

## 2021-07-26 NOTE — Telephone Encounter (Signed)
Ok to take chewable tablets. Goal dose is '125mg'$  per tablet one the night before and one the morning of procedure.

## 2021-07-27 NOTE — Telephone Encounter (Signed)
Spoke with pt relaying Dr. G's message.  Verbalizes understanding.  

## 2021-08-02 ENCOUNTER — Other Ambulatory Visit: Payer: Self-pay | Admitting: Cardiovascular Disease

## 2021-08-05 ENCOUNTER — Ambulatory Visit (INDEPENDENT_AMBULATORY_CARE_PROVIDER_SITE_OTHER): Payer: Medicare Other | Admitting: Gastroenterology

## 2021-08-05 ENCOUNTER — Encounter: Payer: Self-pay | Admitting: Gastroenterology

## 2021-08-05 VITALS — BP 142/88 | HR 77 | Ht 70.0 in | Wt 228.0 lb

## 2021-08-05 DIAGNOSIS — Z7901 Long term (current) use of anticoagulants: Secondary | ICD-10-CM

## 2021-08-05 DIAGNOSIS — K219 Gastro-esophageal reflux disease without esophagitis: Secondary | ICD-10-CM

## 2021-08-05 DIAGNOSIS — Z79899 Other long term (current) drug therapy: Secondary | ICD-10-CM | POA: Diagnosis not present

## 2021-08-05 DIAGNOSIS — K760 Fatty (change of) liver, not elsewhere classified: Secondary | ICD-10-CM

## 2021-08-05 DIAGNOSIS — Z8719 Personal history of other diseases of the digestive system: Secondary | ICD-10-CM

## 2021-08-05 DIAGNOSIS — Z1211 Encounter for screening for malignant neoplasm of colon: Secondary | ICD-10-CM

## 2021-08-05 MED ORDER — OMEPRAZOLE 20 MG PO CPDR: 20.0000 mg | DELAYED_RELEASE_CAPSULE | Freq: Every day | ORAL | 3 refills | Status: DC

## 2021-08-05 NOTE — Patient Instructions (Signed)
If you are age 77 or older, your body mass index should be between 23-30. Your Body mass index is 32.71 kg/m. If this is out of the aforementioned range listed, please consider follow up with your Primary Care Provider.  If you are age 67 or younger, your body mass index should be between 19-25. Your Body mass index is 32.71 kg/m. If this is out of the aformentioned range listed, please consider follow up with your Primary Care Provider.   ________________________________________________________  The [facility] providers would like to encourage you to use [facility] to communicate with providers for non-urgent requests or questions.  Due to long hold times on the telephone, sending your provider a message by [facility] may be a faster and more efficient way to get a response.  Please allow 48 business hours for a response.  Please remember that this is for non-urgent requests.  _______________________________________________________  Decrease omeprazole to 20 mg once daily.  Can increase to 40 mg if needed.  Will consider EGD and colonoscopy.  Thank you for entrusting me with your care and for choosing [facility], Dr. [place] Cellar

## 2021-08-05 NOTE — Progress Notes (Signed)
HPI :  76 year old male with a history of CAD, A-fib, DVT, on chronic Coumadin, history of anal fissure, GERD, here to reestablish care to discuss a few issues today, he was last seen in March 2019.  He has had some reflux symptoms for several years.  He has been on omeprazole 40 mg daily for some time.  He does not think he has tried lower dosing in the past.  He states it works really well to control symptoms and is pretty happy with it.  He denies any dysphagia.  He has never had a prior EGD before.  No family history of esophagus cancer but has had several friends with that, and is concerned about his risk for it.  He last had a colonoscopy in March 2013 which was normal.  He denies any problems with his bowels that are bothering him.  Describes normal bowel habits, no blood in the stools.  He previously had an anal fissure a few years ago, that was treated and healed, he has not had recurrence.  He is generally eating well and denies any bowel complaints at this time.  He denies any family history of colon cancer.  Looking at his chart he had a history of pancreatitis back in 2020.  At that point time there was a question of a pancreatic head mass.  He had a subsequent MRI of the pancreas a few weeks later which showed focal fatty sparing stable compared to changes in 2008, no further work-up was recommended, there was no mass.  He did have a history of gallstones, but his LFTs were normal at the time of his pancreatitis.  He drinks about 3 glasses of wine per week at baseline, does not have any more than that.  He denies any heavy alcohol use around the time of his pancreatitis.  Denies any medication changes at that time.  Has had no recurrence of pancreatitis or abdominal pain since then.  He does have fatty liver noted on prior imaging, his LFTs have historically been normal.  Of note he does have a AAA, as awaiting a follow-up ultrasound to evaluate that which is scheduled in a few weeks.  He  wants to await that result prior to proceeding with any endoscopic evaluation.  He otherwise denies any cardiopulmonary symptoms.  States his A-fib seems well controlled and he is asymptomatic from it.  He denies any chest pain or shortness of breath.  He is compliant with his Coumadin.  Prior workup:  Colonoscopy 04/14/2011 - normal colon Colonoscopy 03/09/2006 - 53m polyp - adenoma   Echo 07/15/2016 - EF 45-50%, dilated LA  Duplex AAA pending   MRI abdomen 09/27/2018: IMPRESSION: 1. Interval improvement in inflammatory changes about the head of pancreas consistent with resolving pancreatitis. 2. Focal, more solid appearing areas within the head/uncinate process of pancreas are identified. There is no associated common bile duct or main duct dilatation. No soft tissue infiltration within the structures surrounding the pancreas. These have a remarkably similar appearance to study from 10/17/2006 and are favored to represent areas of focal fatty sparing. 3.  Aortic Atherosclerosis (ICD10-I70.0). 4. Infrarenal abdominal aortic aneurysm Recommend followup by [facility] 3 years. This recommendation follows ACR consensus guidelines: White Paper of the ACR Incidental Findings Committee II on Vascular Findings. J Am Coll Radiol 2013; 10:789-794. Aortic aneurysm NOS (ICD10-I71.9). 5. Gallstone   CT abdomen / pelvis 09/16/18: pancreatitis and cholelithiasis LFTs normal   Past Medical History:  Diagnosis Date  AAA (abdominal aortic aneurysm) ([place]) 2021   Anxiety    CAD (coronary artery disease)    a. 07/2012 CTA chest - incidental coronary Ca2+ of LAD/LCX; b. 08/2012 Myoview: EF 59%, non ischemia/scar-->Low risk.   Cellulitis of left leg 02/19/2021   Clotting disorder ([place])    Colon polyps    2008 Tubular Adenoma   Cough    chronic   Deep vein thrombosis (DVT) (HCC) 04/06/2010   Depression    DVT (deep venous thrombosis) ([place])    a. RLE DVT ~ 05/2010-->chronic coumadin.   GERD  (gastroesophageal reflux disease)    Hyperlipidemia    Hypertension    controlled on meds   IBS (irritable bowel syndrome)    NICM (nonischemic cardiomyopathy) ([place])    a. 06/2016 Echo: EF 45-50%, diff HK, mod dil LA/RA.   Palpitations    Persistent atrial fibrillation ([place])    a. Dx 06/2010 - CHA2DS2VASc = 3-4 (Chronic coumadin); b. 07/2016 s/p DCCV (150J) - initially successful but reverted to Afib.   Pneumonia    3 wks ago/ no hospitalization/ no follow up required/ was on antibiotic and steroids/Dr Edilia Bo   Presence of partial dental prosthetic device    lower right   Seasonal allergies      Past Surgical History:  Procedure Laterality Date   APPENDECTOMY     1967   CARDIOVERSION N/A 08/03/2016   Procedure: CARDIOVERSION;  Surgeon: Minna Merritts, MD;  Location: [facility] ORS;  Service: Cardiovascular;  Laterality: N/A;   CATARACT EXTRACTION W/PHACO Right 07/08/2015   Procedure: CATARACT EXTRACTION PHACO AND INTRAOCULAR LENS PLACEMENT ([place]) RIGHT EYE;  Surgeon: Leandrew Koyanagi, MD;  Location: [facility];  Service: Ophthalmology;  Laterality: Right;   SYMFONY LENS   CATARACT EXTRACTION W/PHACO Left 08/05/2015   Procedure: CATARACT EXTRACTION PHACO AND INTRAOCULAR LENS PLACEMENT (I[facility]) left eye;  Surgeon: Leandrew Koyanagi, MD;  Location: [facility];  Service: Ophthalmology;  Laterality: Left;  SYMFONY LENS   CATARACT EXTRACTION, BILATERAL Bilateral 08/2015   COLONOSCOPY     x3   POLYPECTOMY     TONSILLECTOMY AND ADENOIDECTOMY     Family History  Problem Relation Age of Onset   Heart disease Mother    Hypertension Mother    Irritable bowel syndrome Mother    Heart attack Father    Hypertension Father    Prostate cancer Father    Colon cancer Neg Hx    Stomach cancer Neg Hx    Esophageal cancer Neg Hx    Pancreatitis Neg Hx    Social History   Tobacco Use   Smoking status: Former    Packs/day: 1.00    Years: 30.00    Total pack years: 30.00     Types: Cigarettes    Quit date: 01/18/1992    Years since quitting: 29.5    Passive exposure: Never   Smokeless tobacco: Never  Vaping Use   Vaping Use: Never used  Substance Use Topics   Alcohol use: Yes    Alcohol/week: 12.0 standard drinks of alcohol    Types: 12 Glasses of wine per week    Comment: 1-2 glasses of wine 3-4 days a weeks   Drug use: No   Current Outpatient Medications  Medication Sig Dispense Refill   acetaminophen (TYLENOL) 650 MG CR tablet Take 650 mg by mouth 2 (two) times daily.     ALPRAZolam (XANAX XR) 0.5 MG 24 hr tablet Take 1 tablet (0.5 mg total) by mouth daily. [place]  tablet 2   atorvastatin (LIPITOR) 40 MG tablet Take 1 tablet (40 mg total) by mouth daily at 6 PM. 90 tablet 2   buPROPion (WELLBUTRIN XL) 150 MG 24 hr tablet Take 1 tablet (150 mg total) by mouth daily. 90 tablet 3   cloNIDine (CATAPRES) 0.1 MG tablet Take 1 tablet (0.1 mg total) by mouth 2 (two) times daily. 60 tablet 4   ezetimibe (ZETIA) 10 MG tablet Take 1 tablet (10 mg total) by mouth daily. 90 tablet 0   fexofenadine (ALLERGY RELIEF) 180 MG tablet TAKE ONE TABLET DAILY FOR CONGESTION. 90 tablet 3   meclizine (ANTIVERT) 25 MG tablet Take 25 mg by mouth 3 (three) times daily as needed for dizziness. Reported on 07/01/2015     metoprolol tartrate (LOPRESSOR) 25 MG tablet Take 1 tablet (25 mg total) by mouth 2 (two) times daily. 180 tablet 3   Multiple Vitamin (MULTIVITAMIN PO) Take 1 tablet by mouth daily. pm     omeprazole (PRILOSEC) 40 MG capsule Take 1 capsule (40 mg total) by mouth daily. 90 capsule 3   warfarin (COUMADIN) 5 MG tablet Take 1/2 a tablet to 1 tablet by mouth daily or as directed by the coumadin clinic. 40 tablet 0   No current facility-administered medications for this visit.   Allergies  Allergen Reactions   Iodine Hives    Contrast dye/ betadine ok     Review of Systems: All systems reviewed and negative except where noted in HPI.   Lab Results  Component Value  Date   WBC 6.4 07/14/2021   HGB 14.8 07/14/2021   HCT 44.2 07/14/2021   MCV 96.3 07/14/2021   PLT 147.0 (L) 07/14/2021    Lab Results  Component Value Date   CREATININE 1.17 07/14/2021   BUN 22 07/14/2021   NA 139 07/14/2021   K 4.3 07/14/2021   CL 103 07/14/2021   CO2 29 07/14/2021    Lab Results  Component Value Date   ALT 23 07/14/2021   AST 21 07/14/2021   ALKPHOS 63 07/14/2021   BILITOT 1.2 07/14/2021     Physical Exam: BP (!) 142/88   Pulse 77   Ht '5\' 10"'$  (1.778 m)   Wt 228 lb (103.4 kg)   SpO2 98%   BMI 32.71 kg/m  Constitutional: Pleasant,well-developed, male in no acute distress. HEENT: Normocephalic and atraumatic. Conjunctivae are normal. No scleral icterus. Neck supple.  Cardiovascular: Normal rate, regular rhythm.  Pulmonary/chest: Effort normal and breath sounds normal.  Abdominal: Soft, nondistended, nontender.There are no masses palpable.  Extremities: no edema Lymphadenopathy: No cervical adenopathy noted. Neurological: Alert and oriented to person place and time. Skin: Skin is warm and dry. No rashes noted. Psychiatric: Normal mood and affect. Behavior is normal.   ASSESSMENT AND PLAN: 76 year old male here to reestablish care for the following:  GERD Long-term PPI use Colon cancer screening Anticoagulated Fatty liver History of pancreatitis  We discussed all these issues today.  His reflux is well controlled but is longstanding.  Given duration of symptoms, age, ethnicity, BMI, he is at risk for Barrett's esophagus.  We discussed if he wanted an EGD to screen for Barrett's esophagus given he has had several friends with esophageal cancer.  He is interested in doing an EGD, which could be done on Coumadin for diagnostic purposes, but wants to think more about this and await the results of his abdominal aortic ultrasound prior to making any decision.  We did discuss his long-term PPI use  and risks associated with that.  Recommend long-term  using the lowest dose of PPI needed to control symptoms.  We will try to reduce his dose from 40 mg of omeprazole daily to 20 mg daily.  Hopefully this will work well for him, if not and he has recurrent symptoms he can increase dosing as needed.  I will await to hear back from him about scheduling an EGD.  He is otherwise asymptomatic in regards to his bowels.  No family history of colon cancer.  Given the results of his last colonoscopy, I reviewed national guidelines with him, he does not need further colonoscopy screening exams at his age unless he is anxious about this or strongly wishes to have another exam.  Reviewed colonoscopy, risks in general of it.  He is likely going to forego further screening in light of his age, but he again wants to think about this.  If he is having an EGD may consider doing a colonoscopy, he understands my recommendations otherwise.  If he does do a colonoscopy he would need to hold Coumadin.  I reviewed fatty liver disease with him, risks for fibrotic change and cirrhosis over time.  His liver enzymes are normal.  He has occasional alcohol use but not excessive.  He should work on weight loss over time, and have his LFTs checked yearly.  Currently stable.  Otherwise unclear what caused his prior episode of pancreatitis.  He does have gallstones but his LFTs were normal.  He has no baseline biliary colic and has not had recurrence since then.  Does not sound like alcohol would be related based on his description of alcohol use.  He did have a follow-up MRI of his pancreas which was stable compared to 2008, changes appear consistent with that of focal fatty sparing of the pancreatic head.  He will monitor and if any recurrent symptoms will let us know.  Plan: - he wants to await results of pending abdominal US prior to making any decisions about endoscopic procedures, will wait to hear back from him - consider EGD to screen for BE, this was offered to him, he will call to  schedule if he wishes to proceed. Can be done on coumadin. - trial of dose reduction of omeprazole from '40mg'$  / day to '20mg'$  / day - counseled on long term risks of chronic PPI use - I don't think he warrants another colonoscopy due to age, but he will think more about it and let me know if he thinks otherwise - counseled on fatty liver, recommend weight loss - counseled on history of pancreatitis as above  Jolly Mango, MD [facility]  CC: Ria Bush, MD

## 2021-08-16 ENCOUNTER — Other Ambulatory Visit: Payer: Self-pay | Admitting: Cardiovascular Disease

## 2021-08-18 ENCOUNTER — Ambulatory Visit (INDEPENDENT_AMBULATORY_CARE_PROVIDER_SITE_OTHER): Payer: Medicare Other

## 2021-08-18 DIAGNOSIS — Z5181 Encounter for therapeutic drug level monitoring: Secondary | ICD-10-CM

## 2021-08-18 DIAGNOSIS — Z86718 Personal history of other venous thrombosis and embolism: Secondary | ICD-10-CM

## 2021-08-18 LAB — POCT INR: INR: 1.6 — AB (ref 2.0–3.0)

## 2021-08-18 NOTE — Patient Instructions (Signed)
-  TAKE 1.5 TABLETS TODAY ONLY - INCREASE TO 1 tablet warfarin daily , EXCEPT 1.5 TABLETS ON MONDAY AND FRIDAY.  EAT 1 SALAD PER WEEK - recheck in 3 weeks

## 2021-08-20 ENCOUNTER — Other Ambulatory Visit: Payer: Self-pay | Admitting: Family Medicine

## 2021-08-20 ENCOUNTER — Ambulatory Visit (INDEPENDENT_AMBULATORY_CARE_PROVIDER_SITE_OTHER): Payer: Medicare Other

## 2021-08-20 DIAGNOSIS — I714 Abdominal aortic aneurysm, without rupture, unspecified: Secondary | ICD-10-CM

## 2021-08-20 DIAGNOSIS — I7143 Infrarenal abdominal aortic aneurysm, without rupture: Secondary | ICD-10-CM | POA: Diagnosis not present

## 2021-09-08 ENCOUNTER — Ambulatory Visit (INDEPENDENT_AMBULATORY_CARE_PROVIDER_SITE_OTHER): Payer: Medicare Other

## 2021-09-08 ENCOUNTER — Other Ambulatory Visit: Payer: Self-pay | Admitting: Cardiovascular Disease

## 2021-09-08 DIAGNOSIS — Z5181 Encounter for therapeutic drug level monitoring: Secondary | ICD-10-CM | POA: Diagnosis not present

## 2021-09-08 DIAGNOSIS — I4819 Other persistent atrial fibrillation: Secondary | ICD-10-CM | POA: Diagnosis not present

## 2021-09-08 LAB — POCT INR: INR: 2.6 (ref 2.0–3.0)

## 2021-09-08 NOTE — Patient Instructions (Signed)
-   CONTINUE 1 tablet warfarin daily , EXCEPT 1.5 TABLETS ON MONDAY AND FRIDAY.  EAT 1 SALAD PER WEEK - recheck in 6 weeks

## 2021-09-08 NOTE — Telephone Encounter (Signed)
Refill Request.  

## 2021-09-29 ENCOUNTER — Ambulatory Visit (INDEPENDENT_AMBULATORY_CARE_PROVIDER_SITE_OTHER): Payer: Medicare Other

## 2021-09-29 ENCOUNTER — Ambulatory Visit
Admission: EM | Admit: 2021-09-29 | Discharge: 2021-09-29 | Disposition: A | Discharge status: Home / Self Care | Payer: Medicare Other | Attending: Emergency Medicine | Admitting: Emergency Medicine

## 2021-09-29 DIAGNOSIS — R059 Cough, unspecified: Secondary | ICD-10-CM | POA: Diagnosis not present

## 2021-09-29 DIAGNOSIS — J209 Acute bronchitis, unspecified: Secondary | ICD-10-CM | POA: Diagnosis not present

## 2021-09-29 DIAGNOSIS — R058 Other specified cough: Secondary | ICD-10-CM

## 2021-09-29 DIAGNOSIS — J0110 Acute frontal sinusitis, unspecified: Secondary | ICD-10-CM

## 2021-09-29 MED ORDER — AMOXICILLIN-POT CLAVULANATE 875-125 MG PO TABS: 1.0000 | ORAL_TABLET | Freq: Two times a day (BID) | ORAL | 0 refills | Status: AC

## 2021-09-29 NOTE — ED Provider Notes (Signed)
Anthony Kim    CSN: [id number] Arrival date & time: 09/29/21  1421      History   Chief Complaint Chief Complaint  Patient presents with   Nasal Congestion   Cough    HPI Anthony Kim. is a 76 y.o. male.  Presents with 1 month history of congestion, cough productive of brown sputum, mild occasional shortness of breath.  No OTC medications taken today but previously took Mucinex.  He denies fever, chills, sore throat, chest pain, vomiting, diarrhea, or other symptoms.  He reports history of pneumonia a few years ago.  He also has remote history of smoking; he quit in 1994; 30-pack-year history.  His medical history includes hypertension, atrial fibrillation, cardiomyopathy, aortic aneurysm, DVT.  The history is provided by the patient and medical records.    Past Medical History:  Diagnosis Date   AAA (abdominal aortic aneurysm) ([place]) 2021   Anxiety    CAD (coronary artery disease)    a. 07/2012 CTA chest - incidental coronary Ca2+ of LAD/LCX; b. 08/2012 Myoview: EF 59%, non ischemia/scar-->Low risk.   Cellulitis of left leg 02/19/2021   Clotting disorder ([place])    Colon polyps    2008 Tubular Adenoma   Cough    chronic   Deep vein thrombosis (DVT) ([facility]) 04/06/2010   Depression    DVT (deep venous thrombosis) ([place])    a. RLE DVT ~ 05/2010-->chronic coumadin.   GERD (gastroesophageal reflux disease)    Hyperlipidemia    Hypertension    controlled on meds   IBS (irritable bowel syndrome)    NICM (nonischemic cardiomyopathy) ([place])    a. 06/2016 Echo: EF 45-50%, diff HK, mod dil LA/RA.   Palpitations    Persistent atrial fibrillation ([place])    a. Dx 06/2010 - CHA2DS2VASc = 3-4 (Chronic coumadin); b. 07/2016 s/p DCCV (150J) - initially successful but reverted to Afib.   Pneumonia    3 wks ago/ no hospitalization/ no follow up required/ was on antibiotic and steroids/Dr Edilia Bo   Presence of partial dental prosthetic device    lower right   Seasonal allergies      Patient Active Problem List   Diagnosis Date Noted   Family history of prostate cancer in father 07/14/2021   Chronic venous insufficiency 03/08/2021   Hearing loss due to cerumen impaction, right 10/18/2018   Abdominal aortic atherosclerosis ([place]) 09/17/2018   AAA (abdominal aortic aneurysm) without rupture ([place]) 09/17/2018   Mass of pancreas 09/17/2018   Advanced care planning/counseling discussion 06/29/2017   Long-term use of high-risk medication 01/28/2017   Persistent atrial fibrillation ([place]) 12/10/2016   Obesity, Class I, BMI 30.0-34.9 (see actual BMI) 09/09/2014   Renal insufficiency 06/23/2014   Encounter for therapeutic drug monitoring 03/20/2013   Sleep apnea 09/05/2012   Allergic rhinitis 03/22/2012   Anxiety 02/04/2011   Long term current use of anticoagulant 04/06/2010   History of DVT of lower extremity 07/18/2008   SKIN CANCER, HX OF 10/30/2007   ERECTILE DYSFUNCTION 04/30/2007   COLONIC POLYPS 2/08 07/12/2006   HYPERCHOLESTEROLEMIA 04/20/2006   MDD (major depressive disorder), recurrent episode, moderate ([place]) 04/20/2006   Essential hypertension 04/20/2006    Past Surgical History:  Procedure Laterality Date   APPENDECTOMY     1967   CARDIOVERSION N/A 08/03/2016   Procedure: CARDIOVERSION;  Surgeon: Minna Merritts, MD;  Location: [facility];  Service: Cardiovascular;  Laterality: N/A;   CATARACT EXTRACTION W/PHACO Right 07/08/2015   Procedure: CATARACT EXTRACTION PHACO AND  INTRAOCULAR LENS PLACEMENT (IOC) RIGHT EYE;  Surgeon: Leandrew Koyanagi, MD;  Location: [place];  Service: Ophthalmology;  Laterality: Right;   SYMFONY LENS   CATARACT EXTRACTION W/PHACO Left 08/05/2015   Procedure: CATARACT EXTRACTION PHACO AND INTRAOCULAR LENS PLACEMENT (IOC) left eye;  Surgeon: Leandrew Koyanagi, MD;  Location: [place];  Service: Ophthalmology;  Laterality: Left;  SYMFONY LENS   CATARACT EXTRACTION, BILATERAL Bilateral 08/2015   COLONOSCOPY      x3   POLYPECTOMY     TONSILLECTOMY AND ADENOIDECTOMY         Home Medications    Prior to Admission medications   Medication Sig Start Date End Date Taking? Authorizing Provider  amoxicillin-clavulanate (AUGMENTIN) 875-125 MG tablet Take 1 tablet by mouth every 12 (twelve) hours for 10 days. 09/29/21 10/09/21 Yes Sharion Balloon, NP  acetaminophen (TYLENOL) 650 MG CR tablet Take 650 mg by mouth 2 (two) times daily.    [provider]  ALPRAZolam (XANAX XR) 0.5 MG 24 hr tablet Take 1 tablet (0.5 mg total) by mouth daily. 07/15/21   Ria Bush, MD  atorvastatin (LIPITOR) 40 MG tablet Take 1 tablet (40 mg total) by mouth daily at 6 PM. 06/07/21   Gollan, Kathlene November, MD  buPROPion (WELLBUTRIN XL) 150 MG 24 hr tablet Take 1 tablet (150 mg total) by mouth daily. 07/15/21   Ria Bush, MD  cloNIDine (CATAPRES) 0.1 MG tablet Take 1 tablet (0.1 mg total) by mouth 2 (two) times daily. 04/21/21   Minna Merritts, MD  ezetimibe (ZETIA) 10 MG tablet Take 1 tablet (10 mg total) by mouth daily. 08/16/21   Minna Merritts, MD  fexofenadine (ALLERGY RELIEF) 180 MG tablet TAKE ONE TABLET DAILY FOR CONGESTION. 07/15/21   Ria Bush, MD  meclizine (ANTIVERT) 25 MG tablet Take 25 mg by mouth 3 (three) times daily as needed for dizziness. Reported on 07/01/2015    [provider]  metoprolol tartrate (LOPRESSOR) 25 MG tablet Take 1 tablet (25 mg total) by mouth 2 (two) times daily. 04/21/21   Minna Merritts, MD  Multiple Vitamin (MULTIVITAMIN PO) Take 1 tablet by mouth daily. pm    [provider]  omeprazole (PRILOSEC) 20 MG capsule Take 1 capsule (20 mg total) by mouth daily. 08/05/21   Armbruster, Carlota Raspberry, MD  warfarin (COUMADIN) 5 MG tablet Take 1/2 a tablet to 1 tablet by mouth daily or as directed by the coumadin clinic. 09/08/21   Minna Merritts, MD    Family History Family History  Problem Relation Age of Onset   Heart disease Mother    Hypertension  Mother    Irritable bowel syndrome Mother    Heart attack Father    Hypertension Father    Prostate cancer Father    Colon cancer Neg Hx    Stomach cancer Neg Hx    Esophageal cancer Neg Hx    Pancreatitis Neg Hx     Social History Social History   Tobacco Use   Smoking status: Former    Packs/day: 1.00    Years: 30.00    Total pack years: 30.00    Types: Cigarettes    Quit date: 01/18/1992    Years since quitting: 29.7    Passive exposure: Never   Smokeless tobacco: Never  Vaping Use   Vaping Use: Never used  Substance Use Topics   Alcohol use: Yes    Alcohol/week: 12.0 standard drinks of alcohol    Types: 12 Glasses  of wine per week    Comment: 1-2 glasses of wine 3-4 days a weeks   Drug use: No     Allergies   Iodine   Review of Systems Review of Systems  Constitutional:  Negative for chills and fever.  HENT:  Positive for congestion. Negative for ear pain and sore throat.   Respiratory:  Positive for cough and shortness of breath.   Cardiovascular:  Negative for chest pain and palpitations.  Gastrointestinal:  Negative for diarrhea and vomiting.  Skin:  Negative for color change and rash.  All other systems reviewed and are negative.    Physical Exam Triage Vital Signs ED Triage Vitals  Enc Vitals Group     BP 09/29/21 1434 (!) 143/88     Pulse Rate 09/29/21 1434 89     Resp 09/29/21 1434 16     Temp 09/29/21 1434 98.2 F (36.8 C)     Temp Source 09/29/21 1434 Temporal     SpO2 09/29/21 1434 97 %     Weight --      Height --      Head Circumference --      Peak Flow --      Pain Score 09/29/21 1433 0     Pain Loc --      Pain Edu? --      Excl. in [place]? --    No data found.  Updated Vital Signs BP (!) 143/88 (BP Location: Left Arm)   Pulse 89   Temp 98.2 F (36.8 C) (Temporal)   Resp 16   SpO2 97%   Visual Acuity Right Eye Distance:   Left Eye Distance:   Bilateral Distance:    Right Eye Near:   Left Eye Near:    Bilateral  Near:     Physical Exam Vitals and nursing note reviewed.  Constitutional:      General: He is not in acute distress.    Appearance: He is well-developed. He is not ill-appearing.  HENT:     Right Ear: Tympanic membrane normal.     Left Ear: Tympanic membrane normal.     Nose: Nose normal.     Mouth/Throat:     Mouth: Mucous membranes are moist.     Pharynx: Oropharynx is clear.  Cardiovascular:     Rate and Rhythm: Normal rate and regular rhythm.     Heart sounds: Normal heart sounds.  Pulmonary:     Effort: Pulmonary effort is normal. No respiratory distress.     Breath sounds: Rhonchi present.     Comments: Left side rhonchi Musculoskeletal:     Cervical back: Neck supple.  Skin:    General: Skin is warm and dry.  Neurological:     Mental Status: He is alert.  Psychiatric:        Mood and Affect: Mood normal.        Behavior: Behavior normal.      UC Treatments / Results  Labs (all labs ordered are listed, but only abnormal results are displayed) Labs Reviewed - No data to display  EKG   Radiology DG Chest 2 View  Result Date: 09/29/2021 CLINICAL DATA:  Productive cough. EXAM: CHEST - 2 VIEW COMPARISON:  Chest x-ray 09/16/2018 FINDINGS: The heart size and mediastinal contours are within normal limits. Both lungs are clear. The visualized skeletal structures are unremarkable. IMPRESSION: No active cardiopulmonary disease. Electronically Signed   By: Ronney Asters M.D.   On: 09/29/2021 15:46  Procedures Procedures (including critical care time)  Medications Ordered in UC Medications - No data to display  Initial Impression / Assessment and Plan / UC Course  I have reviewed the triage vital signs and the nursing notes.  Pertinent labs & imaging results that were available during my care of the patient were reviewed by me and considered in my medical decision making (see chart for details).   Productive cough, acute bronchitis, acute sinusitis.  Chest x-ray  negative.  Patient has been symptomatic for 1 month.  Treating today with Augmentin.  Education provided on acute bronchitis.  Instructed patient to follow-up with his PCP if his symptoms are not improving.  He agrees to plan of care.   Final Clinical Impressions(s) / UC Diagnoses   Final diagnoses:  Productive cough  Acute bronchitis, unspecified organism  Acute non-recurrent frontal sinusitis     Discharge Instructions      Take the Augmentin as directed.  Follow up with your primary care provider.        ED Prescriptions     Medication Sig Dispense Auth. Provider   amoxicillin-clavulanate (AUGMENTIN) 875-125 MG tablet Take 1 tablet by mouth every 12 (twelve) hours for 10 days. 20 tablet Sharion Balloon, NP      PDMP not reviewed this encounter.   Sharion Balloon, NP 09/29/21 1559

## 2021-09-29 NOTE — ED Triage Notes (Signed)
Patient presents to UC for congestion, SOB,  and productive cough x 1 month. Concerned with pneumonia. Treating with mucinex does not help.   Denies fever.

## 2021-09-29 NOTE — Discharge Instructions (Addendum)
Take the Augmentin as directed.  Follow up with your primary care provider.

## 2021-10-06 ENCOUNTER — Encounter: Payer: Self-pay | Admitting: Family Medicine

## 2021-10-06 ENCOUNTER — Ambulatory Visit (INDEPENDENT_AMBULATORY_CARE_PROVIDER_SITE_OTHER)
Admission: RE | Admit: 2021-10-06 | Discharge: 2021-10-06 | Disposition: A | Discharge status: Home / Self Care | Payer: Medicare Other | Source: Ambulatory Visit | Attending: Family Medicine | Admitting: Family Medicine

## 2021-10-06 ENCOUNTER — Ambulatory Visit (INDEPENDENT_AMBULATORY_CARE_PROVIDER_SITE_OTHER): Payer: Medicare Other | Admitting: Family Medicine

## 2021-10-06 VITALS — BP 136/82 | HR 64 | Temp 97.9°F | Ht 70.0 in | Wt 222.1 lb

## 2021-10-06 DIAGNOSIS — Z7901 Long term (current) use of anticoagulants: Secondary | ICD-10-CM

## 2021-10-06 DIAGNOSIS — R059 Cough, unspecified: Secondary | ICD-10-CM | POA: Diagnosis not present

## 2021-10-06 DIAGNOSIS — J181 Lobar pneumonia, unspecified organism: Secondary | ICD-10-CM

## 2021-10-06 DIAGNOSIS — R052 Subacute cough: Secondary | ICD-10-CM

## 2021-10-06 HISTORY — DX: Lobar pneumonia, unspecified organism: J18.1

## 2021-10-06 MED ORDER — AZITHROMYCIN 250 MG PO TABS: ORAL_TABLET | ORAL | 0 refills | Status: AC

## 2021-10-06 MED ORDER — BENZONATATE 100 MG PO CAPS: 100.0000 mg | ORAL_CAPSULE | Freq: Three times a day (TID) | ORAL | 0 refills | Status: DC | PRN

## 2021-10-06 NOTE — Patient Instructions (Addendum)
Chest xray today.  I do want to treat you as possible pneumonia with second antibiotic called azithromycin - take 2 tablets on first day, then 1 tablet daily for total 5 days.  May take tessalon perls as needed for cough.  May continue robitussin cough syrup as needed.  Call coumadin clinic to let them know you're taking both these antibiotics as you may need your coumadin levels adjusted.  Let us know if not improving with this.

## 2021-10-06 NOTE — Assessment & Plan Note (Signed)
He is on chronic coumadin therapy.  Lab Results  Component Value Date   INR 2.6 09/08/2021   INR 1.6 (A) 08/18/2021   INR 2.0 07/07/2021  I have asked him to contact coumadin clinic given possible interactions with both augmentin and azithromycin with coumadin raising INR - he may need his coumadin dose adjusted, rechecked sooner.

## 2021-10-06 NOTE — Progress Notes (Signed)
Patient ID: Anthony Kim., male    DOB: [birth year], 76 y.o.   MRN: [id number]  This visit was conducted in person.  BP 136/82   Pulse 64   Temp 97.9 F (36.6 C) (Temporal)   Ht '5\' 10"'$  (1.778 m)   Wt 222 lb 2 oz (100.8 kg)   SpO2 96%   BMI 31.87 kg/m    CC: [facility] f/u visit  Subjective:   HPI: Anthony Kim. is a 76 y.o. male presenting on 10/06/2021 for Hospitalization Follow-up (Seen on 09/29/21 at [facility], dx prod cough, acute bronchitis; acute non-recurrent frontal sinusitis. )   Seen last week at [facility] in [place] with 1 month history of productive cough, diagnosed with acute bronchitis and sinusitis treated with augmentin 10d course. Continues on this, to finish on Saturday. CXR at that time returned normal.   He's been taking robitussin and cough drops with temporary relief.   Notes some improvement but ongoing symptoms despite antibiotic use.  Notes cough productive of brown mucous, cough worse late in the evenings, mild dyspnea, decreased appetite, fatigue, mild wheezing, new sinus pressure, rhinorrhea and nasal congestion.   No known h/o asthma or COPD, non smoker.  No fevers/chills, ear or tooth pain, abd pain, nausea.   He is on chronic coumadin. He saw GI - omeprazole dose dropped to '20mg'$  daily.      Relevant past medical, surgical, family and social history reviewed and updated as indicated. Interim medical history since our last visit reviewed. Allergies and medications reviewed and updated. Outpatient Medications Prior to Visit  Medication Sig Dispense Refill   acetaminophen (TYLENOL) 650 MG CR tablet Take 650 mg by mouth 2 (two) times daily.     ALPRAZolam (XANAX XR) 0.5 MG 24 hr tablet Take 1 tablet (0.5 mg total) by mouth daily. 30 tablet 2   amoxicillin-clavulanate (AUGMENTIN) 875-125 MG tablet Take 1 tablet by mouth every 12 (twelve) hours for 10 days. 20 tablet 0   atorvastatin (LIPITOR) 40 MG tablet Take 1 tablet (40 mg total) by mouth daily at 6  PM. 90 tablet 2   buPROPion (WELLBUTRIN XL) 150 MG 24 hr tablet Take 1 tablet (150 mg total) by mouth daily. 90 tablet 3   cloNIDine (CATAPRES) 0.1 MG tablet Take 1 tablet (0.1 mg total) by mouth 2 (two) times daily. 60 tablet 4   ezetimibe (ZETIA) 10 MG tablet Take 1 tablet (10 mg total) by mouth daily. 90 tablet 0   fexofenadine (ALLERGY RELIEF) 180 MG tablet TAKE ONE TABLET DAILY FOR CONGESTION. 90 tablet 3   meclizine (ANTIVERT) 25 MG tablet Take 25 mg by mouth 3 (three) times daily as needed for dizziness. Reported on 07/01/2015     metoprolol tartrate (LOPRESSOR) 25 MG tablet Take 1 tablet (25 mg total) by mouth 2 (two) times daily. 180 tablet 3   Multiple Vitamin (MULTIVITAMIN PO) Take 1 tablet by mouth daily. pm     omeprazole (PRILOSEC) 20 MG capsule Take 1 capsule (20 mg total) by mouth daily. 90 capsule 3   warfarin (COUMADIN) 5 MG tablet Take 1/2 a tablet to 1 tablet by mouth daily or as directed by the coumadin clinic. 40 tablet 0   No facility-administered medications prior to visit.     Per HPI unless specifically indicated in ROS section below Review of Systems  Objective:  BP 136/82   Pulse 64   Temp 97.9 F (36.6 C) (Temporal)   Ht '5\' 10"'$  (1.778  m)   Wt 222 lb 2 oz (100.8 kg)   SpO2 96%   BMI 31.87 kg/m   Wt Readings from Last 3 Encounters:  10/06/21 222 lb 2 oz (100.8 kg)  08/05/21 228 lb (103.4 kg)  07/14/21 223 lb 4 oz (101.3 kg)      Physical Exam Vitals and nursing note reviewed.  Constitutional:      Appearance: Normal appearance. He is not ill-appearing.  HENT:     Head: Normocephalic and atraumatic.     Right Ear: Hearing, tympanic membrane, ear canal and external ear normal. There is no impacted cerumen.     Left Ear: Hearing, tympanic membrane, ear canal and external ear normal. There is no impacted cerumen.     Nose: Congestion present. No mucosal edema or rhinorrhea.     Right Turbinates: Not enlarged or swollen.     Left Turbinates: Not  enlarged or swollen.     Right Sinus: No maxillary sinus tenderness or frontal sinus tenderness.     Left Sinus: No maxillary sinus tenderness or frontal sinus tenderness.     Comments: Dry nasal mucosa, R>L nasal edema    Mouth/Throat:     Mouth: Mucous membranes are moist.     Pharynx: Oropharynx is clear. No oropharyngeal exudate or posterior oropharyngeal erythema.  Eyes:     Extraocular Movements: Extraocular movements intact.     Conjunctiva/sclera: Conjunctivae normal.     Pupils: Pupils are equal, round, and reactive to light.  Cardiovascular:     Rate and Rhythm: Normal rate and regular rhythm.     Pulses: Normal pulses.     Heart sounds: Normal heart sounds. No murmur heard. Pulmonary:     Effort: Pulmonary effort is normal. No respiratory distress.     Breath sounds: Wheezing (mild L sided), rhonchi (diffusely LLL) and rales (LLL) present.     Comments: Deep rattly cough present Musculoskeletal:     Cervical back: Normal range of motion and neck supple. No rigidity.     Right lower leg: No edema.     Left lower leg: No edema.  Lymphadenopathy:     Cervical: No cervical adenopathy.  Skin:    General: Skin is warm and dry.     Findings: No rash.  Neurological:     Mental Status: He is alert.  Psychiatric:        Mood and Affect: Mood normal.        Behavior: Behavior normal.       Results for orders placed or performed in visit on 09/08/21  POCT INR  Result Value Ref Range   INR 2.6 2.0 - 3.0    Assessment & Plan:   Problem List Items Addressed This Visit     Long term current use of anticoagulant    He is on chronic coumadin therapy.  Lab Results  Component Value Date   INR 2.6 09/08/2021   INR 1.6 (A) 08/18/2021   INR 2.0 07/07/2021  I have asked him to contact coumadin clinic given possible interactions with both augmentin and azithromycin with coumadin raising INR - he may need his coumadin dose adjusted, rechecked sooner.       Lobar pneumonia  ([place]) - Primary    Recent treatment for bronchitis with sinusitis by [facility] with augmentin course. He has persistent L sided respiratory symptoms concerning for pneumonia, not fully responding to augmentin. Will treat as presumed community acquired LLL lobar pneumonia and add azithromycin course to current augmentin  course.  Tessalon perls for cough.  Update if not improving with treatment.       Relevant Medications   azithromycin (ZITHROMAX) 250 MG tablet   benzonatate (TESSALON) 100 MG capsule     Meds ordered this encounter  Medications   azithromycin (ZITHROMAX) 250 MG tablet    Sig: Take 2 tablets on day 1, then 1 tablet daily on days 2 through 5    Dispense:  6 tablet    Refill:  0   benzonatate (TESSALON) 100 MG capsule    Sig: Take 1 capsule (100 mg total) by mouth 3 (three) times daily as needed for cough.    Dispense:  30 capsule    Refill:  0   Orders Placed This Encounter  Procedures   DG Chest 2 View    Standing Status:   Future    Number of Occurrences:   1    Standing Expiration Date:   10/07/2022    Order Specific Question:   Reason for Exam (SYMPTOM  OR DIAGNOSIS REQUIRED)    Answer:   cough with abnormal breath sounds LLL    Order Specific Question:   Preferred imaging location?    Answer:   [facility]     Patient Instructions  Chest xray today.  I do want to treat you as possible pneumonia with second antibiotic called azithromycin - take 2 tablets on first day, then 1 tablet daily for total 5 days.  May take tessalon perls as needed for cough.  May continue robitussin cough syrup as needed.  Call coumadin clinic to let them know you're taking both these antibiotics as you may need your coumadin levels adjusted.  Let us know if not improving with this.   Follow up plan: Return if symptoms worsen or fail to improve.  Ria Bush, MD

## 2021-10-06 NOTE — Assessment & Plan Note (Addendum)
Recent treatment for bronchitis with sinusitis by [facility] with augmentin course. He has persistent L sided respiratory symptoms concerning for pneumonia, not fully responding to augmentin. Will treat as presumed community acquired LLL lobar pneumonia and add azithromycin course to current augmentin course.  Tessalon perls for cough.  Update if not improving with treatment.

## 2021-10-07 ENCOUNTER — Other Ambulatory Visit: Payer: Self-pay | Admitting: Family Medicine

## 2021-10-07 NOTE — Telephone Encounter (Signed)
Rx sent on 07/15/21, #90/3.

## 2021-10-12 ENCOUNTER — Telehealth: Payer: Self-pay

## 2021-10-12 NOTE — Telephone Encounter (Signed)
[facility] and reschedule INR appointment to 9/27 since starting Antibiotic recently;

## 2021-10-13 ENCOUNTER — Ambulatory Visit: Payer: Medicare Other | Attending: Cardiovascular Disease

## 2021-10-13 DIAGNOSIS — Z5181 Encounter for therapeutic drug level monitoring: Secondary | ICD-10-CM | POA: Insufficient documentation

## 2021-10-13 DIAGNOSIS — I4819 Other persistent atrial fibrillation: Secondary | ICD-10-CM | POA: Insufficient documentation

## 2021-10-13 LAB — POCT INR: INR: 2.6 (ref 2.0–3.0)

## 2021-10-13 NOTE — Patient Instructions (Signed)
-   CONTINUE 1 tablet warfarin daily , EXCEPT 1.5 TABLETS ON MONDAY AND FRIDAY.  EAT 1 SALAD PER WEEK - recheck in 6 weeks

## 2021-10-14 ENCOUNTER — Other Ambulatory Visit: Payer: Self-pay | Admitting: Family Medicine

## 2021-10-14 NOTE — Telephone Encounter (Signed)
Name of Medication: Alprazolam Name of Pharmacy: [facility] or Written Date and Quantity: 09/17/21, #30 Last Office Visit and Type: 10/06/21, ER f/u Next Office Visit and Type: none Last Controlled Substance Agreement Date: 01/24/17 Last UDS: 01/24/17

## 2021-10-17 NOTE — Telephone Encounter (Signed)
ERx 

## 2021-10-18 DIAGNOSIS — Z23 Encounter for immunization: Secondary | ICD-10-CM | POA: Diagnosis not present

## 2021-11-08 ENCOUNTER — Other Ambulatory Visit: Payer: Self-pay | Admitting: Cardiovascular Disease

## 2021-11-11 ENCOUNTER — Other Ambulatory Visit: Payer: Self-pay | Admitting: Cardiovascular Disease

## 2021-11-15 ENCOUNTER — Other Ambulatory Visit: Payer: Self-pay | Admitting: Family Medicine

## 2021-11-15 NOTE — Telephone Encounter (Signed)
Name of Medication: Alprazolam Name of Pharmacy: [facility] or Written Date and Quantity: 10/18/21, #30 Last Office Visit and Type: 10/06/21, ER f/u Next Office Visit and Type: none Last Controlled Substance Agreement Date: 01/24/17 Last UDS: 01/24/17

## 2021-11-16 NOTE — Telephone Encounter (Signed)
ERx 

## 2021-11-17 ENCOUNTER — Telehealth: Payer: Self-pay

## 2021-11-17 NOTE — Chronic Care Management (AMB) (Signed)
Chronic Care Management Pharmacy Assistant   Name: Anthony Kim.  MRN: [id number] DOB: 26-Feb-1945  Reason for Encounter:Hypertension  Disease State   Recent office visits:  10/06/21-Javier Gutierrez,MD(PCP)-hospital follow up -add azithromycin '250mg'$  and tessalon perls,chest xray. 07/14/21-Javier Gutierrez,MD(PCP)-AWV, discuss vaccines, screenings,labs(Your blood counts were overall okay, platelets remain mildly low but were improved. Your cholesterol levels, kidneys, liver, sugar, prostate and urine protein levels returned normal.) schedule colonoscopy, no medication changes, f/u 1 year.  Recent consult visits:  08/05/21-Steven Armbruster,MD(gastro)-f/u gerd,pancreatitis,fatty liver,referral for EGD, reduce omeprazole to '20mg'$  1 tablet daily.   Hospital visits:  None in previous 6 months 09/29/21-[facility]- nasal congestion/cough-Productive cough, acute bronchitis, acute sinusitis.  Chest x-ray negative.  Patient has been symptomatic for 1 month.  Treating today with Augmentin.  Education provided on acute bronchitis.  Medications: Outpatient Encounter Medications as of 11/17/2021  Medication Sig   acetaminophen (TYLENOL) 650 MG CR tablet Take 650 mg by mouth 2 (two) times daily.   ALPRAZolam (XANAX XR) 0.5 MG 24 hr tablet Take 1 tablet (0.5 mg total) by mouth daily.   atorvastatin (LIPITOR) 40 MG tablet Take 1 tablet (40 mg total) by mouth daily at 6 PM.   benzonatate (TESSALON) 100 MG capsule Take 1 capsule (100 mg total) by mouth 3 (three) times daily as needed for cough.   buPROPion (WELLBUTRIN XL) 150 MG 24 hr tablet Take 1 tablet (150 mg total) by mouth daily.   cloNIDine (CATAPRES) 0.1 MG tablet Take 1 tablet (0.1 mg total) by mouth 2 (two) times daily.   ezetimibe (ZETIA) 10 MG tablet Take 1 tablet (10 mg total) by mouth daily.   fexofenadine (ALLERGY RELIEF) 180 MG tablet TAKE ONE TABLET DAILY FOR CONGESTION.   meclizine (ANTIVERT) 25 MG tablet Take 25 mg  by mouth 3 (three) times daily as needed for dizziness. Reported on 07/01/2015   metoprolol tartrate (LOPRESSOR) 25 MG tablet Take 1 tablet (25 mg total) by mouth 2 (two) times daily.   Multiple Vitamin (MULTIVITAMIN PO) Take 1 tablet by mouth daily. pm   omeprazole (PRILOSEC) 20 MG capsule Take 1 capsule (20 mg total) by mouth daily.   warfarin (COUMADIN) 5 MG tablet Take 1/2 a tablet to 1 tablet by mouth daily or as directed by the coumadin clinic.   No facility-administered encounter medications on file as of 11/17/2021.     Recent Office Vitals: BP Readings from Last 3 Encounters:  10/06/21 136/82  09/29/21 (!) 143/88  08/05/21 (!) 142/88   Pulse Readings from Last 3 Encounters:  10/06/21 64  09/29/21 89  08/05/21 77    Wt Readings from Last 3 Encounters:  10/06/21 222 lb 2 oz (100.8 kg)  08/05/21 228 lb (103.4 kg)  07/14/21 223 lb 4 oz (101.3 kg)     Kidney Function Lab Results  Component Value Date/Time   CREATININE 1.17 07/14/2021 03:28 PM   CREATININE 0.98 03/11/2020 09:12 AM   GFR 60.33 07/14/2021 03:28 PM   GFRNONAA >60 09/16/2018 12:03 PM   GFRAA >60 09/16/2018 12:03 PM       Latest Ref Rng & Units 07/14/2021    3:28 PM 03/11/2020    9:12 AM 10/18/2018    1:07 PM  BMP  Glucose 70 - 99 mg/dL 95  100  97   BUN 6 - 23 mg/dL '22  16  25   '$ Creatinine 0.40 - 1.50 mg/dL 1.17  0.98  1.03   Sodium 135 - 145 mEq/L  139  141  142   Potassium 3.5 - 5.1 mEq/L 4.3  4.8  4.9   Chloride 96 - 112 mEq/L 103  103  105   CO2 19 - 32 mEq/L 29  33  30   Calcium 8.4 - 10.5 mg/dL 9.1  9.2  9.5      Contacted patient on 11/30/21 to discuss hypertension disease state  Current antihypertensive regimen:  Clonidine 0.1 mg BID  Metoprolol tartrate 25 mg BID    Patient verbally confirms he is taking the above medications as directed. Yes  How often are you checking your Blood Pressure? infrequently,when feeling symptomatic  Current home BP readings:  Patient did not have many  readings.Patient states he has been feeling very well and takes BP when he feels bad 132/76 141/80 Wrist or arm cuff: arm cuff Caffeine intake: caffeine free drinks Salt intake: eliminated from diet  Over the counter medications including pseudoephedrine or NSAIDs? Nothing OTC  Any readings above 180/120? No  What recent interventions/DTPs have been made by any provider to improve Blood Pressure control since last CPP Visit:   Counseled to monitor BP at home monthly or with symptoms of hypotension Any recent hospitalizations or ED visits since last visit with CPP? Yes-09/29/21  Productive cough, acute bronchitis, acute sinusitis.  Chest x-ray negative.  Patient has been symptomatic for 1 month.  Treating today with Augmentin.  Education provided on acute bronchitis.  What diet changes have been made to improve Blood Pressure Control?  Patient states he eliminated salt.Patient states meals are fresh vegetables, grilled meats.  What exercise is being done to improve your Blood Pressure Control?   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 No Does the patient have >5 day gap between last estimated fill dates? No  Summary of recommendations from last [place] visit (Date:06/07/21) Summary: CCM F/U visit -Medications reviewed, pt endorses compliance as prescribed. He never took furosemide for PRN use and denies excess swelling at this time   Recommendations/Changes made from today's visit: -Removed furosemide from med list. No other changes.   Star Rating Drugs:  Medication:  Last Fill: Day Supply Atorvastatin '40mg'$  09/09/21 90  Care Gaps: Annual wellness visit in last year? Yes Most Recent BP reading:136/82  10/06/21   Upcoming appointments: Cardiology appointment on 11/24/21   Charlene Brooke, CPP notified  Avel Sensor, [place]  [phone]

## 2021-11-22 ENCOUNTER — Other Ambulatory Visit: Payer: Self-pay | Admitting: Cardiovascular Disease

## 2021-11-24 ENCOUNTER — Ambulatory Visit: Payer: Medicare Other | Attending: Cardiovascular Disease

## 2021-11-24 DIAGNOSIS — Z5181 Encounter for therapeutic drug level monitoring: Secondary | ICD-10-CM

## 2021-11-24 DIAGNOSIS — I4819 Other persistent atrial fibrillation: Secondary | ICD-10-CM | POA: Diagnosis not present

## 2021-11-24 LAB — POCT INR: INR: 2.8 (ref 2.0–3.0)

## 2021-11-24 NOTE — Patient Instructions (Signed)
-   CONTINUE 1 tablet warfarin daily , EXCEPT 1.5 TABLETS ON MONDAY AND FRIDAY.  - recheck in 6 weeks

## 2021-12-13 ENCOUNTER — Other Ambulatory Visit: Payer: Self-pay | Admitting: Family Medicine

## 2021-12-13 NOTE — Telephone Encounter (Signed)
Last refill 11/16/2021 Last visit  10/06/2021 Next Visit No follow up appt.

## 2021-12-14 NOTE — Telephone Encounter (Signed)
ERx 

## 2021-12-29 ENCOUNTER — Other Ambulatory Visit: Payer: Self-pay | Admitting: Cardiovascular Disease

## 2021-12-29 DIAGNOSIS — H903 Sensorineural hearing loss, bilateral: Secondary | ICD-10-CM | POA: Diagnosis not present

## 2021-12-29 DIAGNOSIS — H6123 Impacted cerumen, bilateral: Secondary | ICD-10-CM | POA: Diagnosis not present

## 2021-12-29 NOTE — Telephone Encounter (Signed)
Please review for refill.  

## 2021-12-29 NOTE — Telephone Encounter (Signed)
Refill request for warfarin:  Last INR was 2.8 on 11/24/21 Next INR due 01/05/22 LOV was 04/21/21  Johnny Bridge MD  Refill approved.

## 2022-01-05 ENCOUNTER — Ambulatory Visit: Payer: Medicare Other | Attending: Cardiovascular Disease

## 2022-01-05 DIAGNOSIS — Z5181 Encounter for therapeutic drug level monitoring: Secondary | ICD-10-CM | POA: Diagnosis not present

## 2022-01-05 DIAGNOSIS — I4819 Other persistent atrial fibrillation: Secondary | ICD-10-CM

## 2022-01-05 LAB — POCT INR: INR: 3.3 — AB (ref 2.0–3.0)

## 2022-01-05 NOTE — Patient Instructions (Signed)
-  HOLD TONIGHT ONLY THEN - CONTINUE 1 tablet warfarin daily , EXCEPT 1.5 TABLETS ON MONDAY AND FRIDAY.  - recheck in 6 weeks

## 2022-02-07 ENCOUNTER — Other Ambulatory Visit: Payer: Self-pay | Admitting: Family Medicine

## 2022-02-07 NOTE — Telephone Encounter (Signed)
Refill request Alprazolam Last office visit 10/06/21 Last refill 12/14/21 #30/1 No upcoming appointment scheduled

## 2022-02-08 NOTE — Telephone Encounter (Signed)
ERx 

## 2022-02-16 ENCOUNTER — Ambulatory Visit: Payer: Medicare Other | Attending: Cardiovascular Disease

## 2022-02-16 DIAGNOSIS — I4819 Other persistent atrial fibrillation: Secondary | ICD-10-CM | POA: Diagnosis not present

## 2022-02-16 DIAGNOSIS — Z5181 Encounter for therapeutic drug level monitoring: Secondary | ICD-10-CM | POA: Diagnosis not present

## 2022-02-16 LAB — POCT INR: INR: 5.1 — AB (ref 2.0–3.0)

## 2022-02-16 NOTE — Patient Instructions (Signed)
-  HOLD TONIGHT and THURSDAY ONLY THEN - CONTINUE 1 tablet warfarin daily , EXCEPT 1.5 TABLETS ON MONDAY AND FRIDAY.  - recheck in 2 weeks

## 2022-03-02 ENCOUNTER — Ambulatory Visit: Payer: Medicare Other | Attending: Cardiovascular Disease | Admitting: Pharmacist

## 2022-03-02 DIAGNOSIS — Z5181 Encounter for therapeutic drug level monitoring: Secondary | ICD-10-CM

## 2022-03-02 DIAGNOSIS — I4819 Other persistent atrial fibrillation: Secondary | ICD-10-CM | POA: Diagnosis not present

## 2022-03-02 LAB — POCT INR: INR: 3.8 — AB (ref 2.0–3.0)

## 2022-03-02 NOTE — Patient Instructions (Signed)
Description   -Hold dose tonight and then reduce dose to 1 tablet daily except 1.5 tablets on Fridays - recheck in 2 weeks

## 2022-03-10 ENCOUNTER — Other Ambulatory Visit: Payer: Self-pay | Admitting: Cardiovascular Disease

## 2022-03-10 NOTE — Telephone Encounter (Signed)
Pt scheduled on 5/7

## 2022-03-10 NOTE — Telephone Encounter (Signed)
Please schedule 12 month F/U appt for 90 day refills. Thank you!

## 2022-03-16 ENCOUNTER — Ambulatory Visit: Payer: Medicare Other | Attending: Cardiovascular Disease

## 2022-03-16 DIAGNOSIS — I4819 Other persistent atrial fibrillation: Secondary | ICD-10-CM | POA: Diagnosis not present

## 2022-03-16 DIAGNOSIS — Z5181 Encounter for therapeutic drug level monitoring: Secondary | ICD-10-CM | POA: Insufficient documentation

## 2022-03-16 LAB — POCT INR: INR: 2.6 (ref 2.0–3.0)

## 2022-03-16 NOTE — Patient Instructions (Signed)
Description   Continue on same dosage of Warfarin 1 tablet daily except 1.5 tablets on Fridays.  Recheck in 4 weeks.

## 2022-03-23 ENCOUNTER — Ambulatory Visit (INDEPENDENT_AMBULATORY_CARE_PROVIDER_SITE_OTHER): Payer: Medicare Other

## 2022-03-23 VITALS — Ht 70.0 in | Wt 227.8 lb

## 2022-03-23 DIAGNOSIS — Z0000 Encounter for general adult medical examination without abnormal findings: Secondary | ICD-10-CM | POA: Diagnosis not present

## 2022-03-23 NOTE — Progress Notes (Signed)
I connected with  Zachery Conch. on 03/23/22 by a audio enabled telemedicine application and verified that I am speaking with the correct person using two identifiers.  Patient Location: Home  Provider Location: Home Office  I discussed the limitations of evaluation and management by telemedicine. The patient expressed understanding and agreed to proceed.  Subjective:   Anthony Kim. is a 77 y.o. male who presents for Medicare Annual/Subsequent preventive examination.  Review of Systems      Cardiac Risk Factors include: advanced age (>11mn, >>91women);hypertension;male gender     Objective:    Today's Vitals   03/23/22 1138 03/23/22 1146  Weight: 227 lb 12.8 oz (103.3 kg)   Height: '5\' 10"'$  (1.778 m)   PainSc:  0-No pain   Body mass index is 32.69 kg/m.     03/23/2022   11:55 AM 03/10/2021    2:01 PM 03/05/2020   11:47 AM 09/16/2018   12:02 PM 07/03/2018   12:11 PM 06/22/2017   10:21 AM 08/03/2016    6:46 AM  Advanced Directives  Does Patient Have a Medical Advance Directive? Yes Yes Yes No Yes Yes No  Type of AParamedicof ARaysalLiving will HBatesvilleLiving will HFordLiving will  HAur[place]Living will [place]ow[place]Living will   Does patient want to make changes to medical advance directive? No - Patient declined Yes (MAU/Ambulatory/Procedural Areas - Information given)   No - Patient declined    Copy of [place]in Chart? Yes - validated most recent copy scanned in chart (See row information)  No - copy requested  Yes - validated most recent copy scanned in chart (See row information) No - copy requested   Would patient like information on creating a medical advance directive?    No - Patient declined       Current Medications (verified) Outpatient Encounter Medications as of 03/23/2022  Medication Sig   acetaminophen (TYLENOL) 650 MG CR tablet  Take 650 mg by mouth 2 (two) times daily.   ALPRAZolam (XANAX XR) 0.5 MG 24 hr tablet Take 1 tablet (0.5 mg total) by mouth daily.   atorvastatin (LIPITOR) 40 MG tablet Take 1 tablet (40 mg total) by mouth daily at 6 PM.   buPROPion (WELLBUTRIN XL) 150 MG 24 hr tablet Take 1 tablet (150 mg total) by mouth daily.   cloNIDine (CATAPRES) 0.1 MG tablet Take 1 tablet (0.1 mg total) by mouth 2 (two) times daily.   ezetimibe (ZETIA) 10 MG tablet Take 1 tablet (10 mg total) by mouth daily.   fexofenadine (ALLERGY RELIEF) 180 MG tablet TAKE ONE TABLET DAILY FOR CONGESTION.   metoprolol tartrate (LOPRESSOR) 25 MG tablet Take 1 tablet (25 mg total) by mouth 2 (two) times daily.   Multiple Vitamin (MULTIVITAMIN PO) Take 1 tablet by mouth daily. pm   omeprazole (PRILOSEC) 20 MG capsule Take 1 capsule (20 mg total) by mouth daily.   warfarin (COUMADIN) 5 MG tablet Take 1 tablet to 1 1/2 tablets by mouth daily or as directed by the coumadin clinic.   benzonatate (TESSALON) 100 MG capsule Take 1 capsule (100 mg total) by mouth 3 (three) times daily as needed for cough. (Patient not taking: Reported on 03/23/2022)   meclizine (ANTIVERT) 25 MG tablet Take 25 mg by mouth 3 (three) times daily as needed for dizziness. Reported on 07/01/2015 (Patient not taking: Reported on 03/23/2022)   No facility-administered  encounter medications on file as of 03/23/2022.    Allergies (verified) Iodine   History: Past Medical History:  Diagnosis Date   AAA (abdominal aortic aneurysm) ([place]) 2021   Anxiety    CAD (coronary artery disease)    a. 07/2012 CTA chest - incidental coronary Ca2+ of LAD/LCX; b. 08/2012 Myoview: EF 59%, non ischemia/scar-->Low risk.   Cellulitis of left leg 02/19/2021   Clotting disorder ([place])    Colon polyps    2008 Tubular Adenoma   Cough    chronic   Deep vein thrombosis (DVT) ([facility]) 04/06/2010   Depression    DVT (deep venous thrombosis) ([place])    a. RLE DVT ~ 05/2010-->chronic coumadin.   GERD  (gastroesophageal reflux disease)    Hyperlipidemia    Hypertension    controlled on meds   IBS (irritable bowel syndrome)    NICM (nonischemic cardiomyopathy) ([place])    a. 06/2016 Echo: EF 45-50%, diff HK, mod dil LA/RA.   Palpitations    Persistent atrial fibrillation ([place])    a. Dx 06/2010 - CHA2DS2VASc = 3-4 (Chronic coumadin); b. 07/2016 s/p DCCV (150J) - initially successful but reverted to Afib.   Pneumonia    3 wks ago/ no hospitalization/ no follow up required/ was on antibiotic and steroids/Dr Edilia Bo   Presence of partial dental prosthetic device    lower right   Seasonal allergies    Past Surgical History:  Procedure Laterality Date   APPENDECTOMY     1967   CARDIOVERSION N/A 08/03/2016   Procedure: CARDIOVERSION;  Surgeon: Minna Merritts, MD;  Location: [facility] ORS;  Service: Cardiovascular;  Laterality: N/A;   CATARACT EXTRACTION W/PHACO Right 07/08/2015   Procedure: CATARACT EXTRACTION PHACO AND INTRAOCULAR LENS PLACEMENT ([place]) RIGHT EYE;  Surgeon: Leandrew Koyanagi, MD;  Location: [facility];  Service: Ophthalmology;  Laterality: Right;   SYMFONY LENS   CATARACT EXTRACTION W/PHACO Left 08/05/2015   Procedure: CATARACT EXTRACTION PHACO AND INTRAOCULAR LENS PLACEMENT ([facility]) left eye;  Surgeon: Leandrew Koyanagi, MD;  Location: [facility];  Service: Ophthalmology;  Laterality: Left;  SYMFONY LENS   CATARACT EXTRACTION, BILATERAL Bilateral 08/2015   COLONOSCOPY     x3   POLYPECTOMY     TONSILLECTOMY AND ADENOIDECTOMY     Family History  Problem Relation Age of Onset   Heart disease Mother    Hypertension Mother    Irritable bowel syndrome Mother    Heart attack Father    Hypertension Father    Prostate cancer Father    Colon cancer Neg Hx    Stomach cancer Neg Hx    Esophageal cancer Neg Hx    Pancreatitis Neg Hx    Social History   Socioeconomic History   Marital status: Widowed    Spouse name: Not on file   Number of children: 1   Years  of education: Not on file   Highest education level: Not on file  Occupational History   Occupation: Scientist, clinical (histocompatibility and immunogenetics): [employer]  Tobacco Use   Smoking status: Former    Packs/day: 1.00    Years: 30.00    Total pack years: 30.00    Types: Cigarettes    Quit date: 01/18/1992    Years since quitting: 30.1    Passive exposure: Never   Smokeless tobacco: Never  Vaping Use   Vaping Use: Never used  Substance and Sexual Activity   Alcohol use: Yes    Alcohol/week: 12.0 standard drinks of alcohol  Types: 12 Glasses of wine per week    Comment: 1-2 glasses of wine 3-4 days a weeks   Drug use: No   Sexual activity: Yes  Other Topics Concern   Not on file  Social History Narrative   Has living will.   Desires CPR.   Would not want life support if recovery futile.  Unsure about feeding tube.   Lives with wife [name]   Activity: active on cattle farm   Diet: good water, G2 gatorade, fruits/vegetables daily    Social Determinants of Health   Financial Resource Strain: Low Risk  (03/23/2022)   Overall Financial Resource Strain (CARDIA)    Difficulty of Paying Living Expenses: Not hard at all  Food Insecurity: No Food Insecurity (03/23/2022)   Hunger Vital Sign    Worried About Running Out of Food in the Last Year: Never true    Ran Out of Food in the Last Year: Never true  Transportation Needs: No Transportation Needs (03/23/2022)   PRAPARE - Hydrologist (Medical): No    Lack of Transportation (Non-Medical): No  Physical Activity: Insufficiently Active (03/23/2022)   Exercise Vital Sign    Days of Exercise per Week: 7 days    Minutes of Exercise per Session: 20 min  Stress: No Stress Concern Present (03/23/2022)   [place]    Feeling of Stress : Not at all  Social Connections: Socially Isolated (03/23/2022)   Social Connection and Isolation Panel [NHANES]    Frequency of  Communication with Friends and Family: More than three times a week    Frequency of Social Gatherings with Friends and Family: Twice a week    Attends Religious Services: Never    Marine scientist or Organizations: No    Attends Archivist Meetings: Never    Marital Status: Widowed    Tobacco Counseling Counseling given: Not Answered   Clinical Intake:  Pre-visit preparation completed: Yes  Pain : No/denies pain Pain Score: 0-No pain     Nutritional Risks: None Diabetes: No  How often do you need to have someone help you when you read instructions, pamphlets, or other written materials from your doctor or pharmacy?: 1 - Never  Diabetic? no  Interpreter Needed?: No  Information entered by :: C.Trendon Zaring LPN   Activities of Daily Living    03/23/2022   11:55 AM  In your present state of health, do you have any difficulty performing the following activities:  Hearing? 0  Vision? 0  Difficulty concentrating or making decisions? 0  Walking or climbing stairs? 0  Dressing or bathing? 0  Doing errands, shopping? 0  Preparing Food and eating ? N  Using the Toilet? N  In the past six months, have you accidently leaked urine? N  Do you have problems with loss of bowel control? N  Managing your Medications? N  Managing your Finances? N  Housekeeping or managing your Housekeeping? N    Patient Care Team: Ria Bush, MD as PCP - General (Family Medicine) Rockey Situ Kathlene November, MD as PCP - Cardiology (Cardiology) Rockey Situ Kathlene November, MD as Consulting Physician (Cardiology) Inda Castle, MD (Inactive) as Consulting Physician (Gastroenterology) McDiarmid, Blane Ohara, MD as Consulting Physician (Family Medicine) Bjorn Loser, MD as Consulting Physician (Urology) Philis Kendall, MD as Consulting Physician (Ophthalmology) Gaynelle Arabian, MD as Consulting Physician (Orthopedic Surgery) Leandrew Koyanagi, MD as Referring Physician  (Ophthalmology) Charlton Haws,  [place] as Pharmacist (Pharmacist)  Indicate any recent Medical Services you may have received from other than [facility] providers in the past year (date may be approximate).     Assessment:   This is a routine wellness examination for [place].  Hearing/Vision screen Hearing Screening - Comments:: No aids Vision Screening - Comments:: Glasses - Dr.Brasington  Dietary issues and exercise activities discussed: Current Exercise Habits: Home exercise routine, Type of exercise: walking, Time (Minutes): 20, Frequency (Times/Week): 7, Weekly Exercise (Minutes/Week): 140, Intensity: Mild, Exercise limited by: None identified   Goals Addressed             This Visit's Progress    Patient Stated       Stay healthy.       Depression Screen    03/23/2022   11:53 AM 03/10/2021    2:06 PM 03/05/2020   11:48 AM 07/03/2018   12:06 PM 06/22/2017   10:15 AM 09/29/2015   11:45 AM 06/23/2014    8:33 AM  PHQ 2/9 Scores  PHQ - 2 Score 0 0 2 0 0 0 0  PHQ- 9 Score   2 0 0      Fall Risk    03/23/2022   11:47 AM 03/10/2021    2:02 PM 03/05/2020   11:48 AM 07/03/2018   12:06 PM 06/22/2017   10:15 AM  Fall Risk   Falls in the past year? 0 0 0 0 No  Number falls in past yr: 0 0 0    Injury with Fall? 0 0 0    Risk for fall due to : No Fall Risks No Fall Risks Medication side effect    Follow up Falls prevention discussed;Falls evaluation completed Falls prevention discussed Falls evaluation completed;Falls prevention discussed      FALL RISK PREVENTION PERTAINING TO THE HOME:  Any stairs in or around the home? No  If so, are there any without handrails? No  Home free of loose throw rugs in walkways, pet beds, electrical cords, etc? Yes  Adequate lighting in your home to reduce risk of falls? Yes   ASSISTIVE DEVICES UTILIZED TO PREVENT FALLS:  Life alert? No  Use of a cane, walker or w/c? No  Grab bars in the bathroom? Yes  Shower chair or bench in shower? No   Elevated toilet seat or a handicapped toilet? Yes    Cognitive Function:    03/05/2020   11:50 AM 07/03/2018   12:10 PM 06/22/2017   10:20 AM 09/29/2015   11:50 AM  MMSE - Mini Mental State Exam  Orientation to time '5 5 5 5  '$ Orientation to Place '5 5 5 5  '$ Registration '3 3 3 3  '$ Attention/ Calculation 5 0 0 0  Recall '3 3 3 3  '$ Language- name 2 objects  0 0 0  Language- repeat '1 1 1 1  '$ Language- follow 3 step command  0 3 3  Language- read & follow direction  0 0 0  Write a sentence  0 0 0  Copy design  0 0 0  Total score  '17 20 20        '$ 03/23/2022   11:56 AM  6CIT Screen  What Year? 0 points  What month? 0 points  What time? 0 points  Count back from 20 0 points  Months in reverse 0 points  Repeat phrase 4 points  Total Score 4 points    Immunizations Immunization History  Administered Date(s) Administered   Fluad Quad(high  Dose 65+) 10/18/2018, 10/30/2019, 01/13/2022   Influenza Split 10/31/2011   Influenza Whole 10/30/2007, 10/28/2008, 10/27/2009   Influenza,inj,Quad PF,6+ Mos 11/13/2013, 10/14/2014, 10/01/2015, 01/12/2017, 11/09/2017   Influenza-Unspecified 09/17/2020   Moderna Sars-Covid-2 Vaccination 03/06/2019, 04/03/2019, 11/21/2019   Pneumococcal Conjugate-13 11/13/2013   Pneumococcal Polysaccharide-23 03/23/2011   Td 08/03/2000, 09/16/2008   Zoster Recombinat (Shingrix) 08/06/2021, 11/23/2021   Zoster, Live 10/28/2008    TD- UTD per [facility] in [place] [facility]  Flu Vaccine status: Up to date per pt.  Pneumococcal vaccine status: Up to date  Covid-19 vaccine status: Information provided on how to obtain vaccines.   Qualifies for Shingles Vaccine? Yes   Zostavax completed Yes   Shingrix Completed?: Yes per pt  Screening Tests Health Maintenance  Topic Date Due   DTaP/Tdap/Td (3 - Tdap) 09/17/2018   COVID-19 Vaccine (4 - 2023-24 season) 09/17/2021   Medicare Annual Wellness (AWV)  03/23/2023   Pneumonia Vaccine 59+ Years old  Completed   INFLUENZA  VACCINE  Completed   Hepatitis C Screening  Completed   Zoster Vaccines- Shingrix  Completed   HPV VACCINES  Aged Out   COLONOSCOPY (Pts 45-109yr Insurance coverage will need to be confirmed)  [place]aintenance Due  Topic Date Due   DTaP/Tdap/Td (3 - Tdap) 09/17/2018   COVID-19 Vaccine (4 - 2023-24 season) 09/17/2021    Colorectal cancer screening: No longer required.   Lung Cancer Screening: (Low Dose CT Chest recommended if Age 78-80years, 30 pack-year currently smoking OR have quit w/in 15years.) does not qualify.   Lung Cancer Screening Referral: no  Additional Screening:  Hepatitis C Screening: does not qualify; Completed 06/22/17  Vision Screening: Recommended annual ophthalmology exams for early detection of glaucoma and other disorders of the eye. Is the patient up to date with their annual eye exam?  Yes  Who is the provider or what is the name of the office in which the patient attends annual eye exams? Dr.Brasington  If pt is not established with a provider, would they like to be referred to a provider to establish care? No .   Dental Screening: Recommended annual dental exams for proper oral hygiene  Community Resource Referral / Chronic Care Management: CRR required this visit?  No   CCM required this visit?  No      Plan:     I have personally reviewed and noted the following in the patient's chart:   Medical and social history Use of alcohol, tobacco or illicit drugs  Current medications and supplements including opioid prescriptions. Patient is not currently taking opioid prescriptions. Functional ability and status Nutritional status Physical activity Advanced directives List of other physicians Hospitalizations, surgeries, and ER visits in previous 12 months Vitals Screenings to include cognitive, depression, and falls Referrals and appointments  In addition, I have reviewed and discussed with patient  certain preventive protocols, quality metrics, and best practice recommendations. A written personalized care plan for preventive services as well as general preventive health recommendations were provided to patient.     CLebron Conners LPN   [id number]  Nurse Notes: Pt stated [facility] is [facility] to date on [facility] [place]

## 2022-03-23 NOTE — Patient Instructions (Signed)
Anthony Kim , Thank you for taking time to come for your Medicare Wellness Visit. I appreciate your ongoing commitment to your health goals. Please review the following plan we discussed and let me know if I can assist you in the future.   These are the goals we discussed:  Goals      Patient Stated     Starting 07/04/2018, I will continue to take medications as prescribed.      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      Patient Stated     Would like to start walking      Patient Stated     Stay healthy.        This is a list of the screening recommended for you and due dates:  Health Maintenance  Topic Date Due   DTaP/Tdap/Td vaccine (3 - Tdap) 09/17/2018   COVID-19 Vaccine (4 - 2023-24 season) 09/17/2021   Medicare Annual Wellness Visit  03/23/2023   Pneumonia Vaccine  Completed   Flu Shot  Completed   Hepatitis C Screening: USPSTF Recommendation to screen - Ages 18-79 yo.  Completed   Zoster (Shingles) Vaccine  Completed   HPV Vaccine  Aged Out   Colon Cancer Screening  Discontinued    Advanced directives: Copy is in chart.  Conditions/risks identified: Aim for 30 minutes of exercise or brisk walking, 6-8 glasses of water, and 5 servings of fruits and vegetables each day.   Next appointment: Follow up in one year for your annual wellness visit. 03/28/2023 @ 3:15 pm via telephone.  Preventive Care 44 Years and Older, Male  Preventive care refers to lifestyle choices and visits with your health care provider that can promote health and wellness. What does preventive care include? A yearly physical exam. This is also called an annual well check. Dental exams once or twice a year. Routine eye exams. Ask your health care provider how often you should have your eyes checked. Personal lifestyle choices, including: Daily care of your teeth and gums. Regular physical activity. Eating a healthy diet. Avoiding tobacco and drug  use. Limiting alcohol use. Practicing safe sex. Taking low doses of aspirin every day. Taking vitamin and mineral supplements as recommended by your health care provider. What happens during an annual well check? The services and screenings done by your health care provider during your annual well check will depend on your age, overall health, lifestyle risk factors, and family history of disease. Counseling  Your health care provider may ask you questions about your: Alcohol use. Tobacco use. Drug use. Emotional well-being. Home and relationship well-being. Sexual activity. Eating habits. History of falls. Memory and ability to understand (cognition). Work and work Statistician. Screening  You may have the following tests or measurements: Height, weight, and BMI. Blood pressure. Lipid and cholesterol levels. These may be checked every 5 years, or more frequently if you are over 66 years old. Skin check. Lung cancer screening. You may have this screening every year starting at age 87 if you have a 30-pack-year history of smoking and currently smoke or have quit within the past 15 years. Fecal occult blood test (FOBT) of the stool. You may have this test every year starting at age 57. Flexible sigmoidoscopy or colonoscopy. You may have a sigmoidoscopy every 5 years or a colonoscopy every 10 years starting at age 65. Prostate cancer screening. Recommendations will vary depending on your family history  and other risks. Hepatitis C blood test. Hepatitis B blood test. Sexually transmitted disease (STD) testing. Diabetes screening. This is done by checking your blood sugar (glucose) after you have not eaten for a while (fasting). You may have this done every 1-3 years. Abdominal aortic aneurysm (AAA) screening. You may need this if you are a current or former smoker. Osteoporosis. You may be screened starting at age 32 if you are at high risk. Talk with your health care provider about  your test results, treatment options, and if necessary, the need for more tests. Vaccines  Your health care provider may recommend certain vaccines, such as: Influenza vaccine. This is recommended every year. Tetanus, diphtheria, and acellular pertussis (Tdap, Td) vaccine. You may need a Td booster every 10 years. Zoster vaccine. You may need this after age 9. Pneumococcal 13-valent conjugate (PCV13) vaccine. One dose is recommended after age 12. Pneumococcal polysaccharide (PPSV23) vaccine. One dose is recommended after age 36. Talk to your health care provider about which screenings and vaccines you need and how often you need them. This information is not intended to replace advice given to you by your health care provider. Make sure you discuss any questions you have with your health care provider. Document Released: 01/30/2015 Document Revised: 09/23/2015 Document Reviewed: 11/04/2014 Elsevier Interactive Patient Education  2017 [employer] Prevention in the Home Falls can cause injuries. They can happen to people of all ages. There are many things you can do to make your home safe and to help prevent falls. What can I do on the outside of my home? Regularly fix the edges of walkways and driveways and fix any cracks. Remove anything that might make you trip as you walk through a door, such as a raised step or threshold. Trim any bushes or trees on the path to your home. Use bright outdoor lighting. Clear any walking paths of anything that might make someone trip, such as rocks or tools. Regularly check to see if handrails are loose or broken. Make sure that both sides of any steps have handrails. Any raised decks and porches should have guardrails on the edges. Have any leaves, snow, or ice cleared regularly. Use sand or salt on walking paths during winter. Clean up any spills in your garage right away. This includes oil or grease spills. What can I do in the bathroom? Use  night lights. Install grab bars by the toilet and in the tub and shower. Do not use towel bars as grab bars. Use non-skid mats or decals in the tub or shower. If you need to sit down in the shower, use a plastic, non-slip stool. Keep the floor dry. Clean up any water that spills on the floor as soon as it happens. Remove soap buildup in the tub or shower regularly. Attach bath mats securely with double-sided non-slip rug tape. Do not have throw rugs and other things on the floor that can make you trip. What can I do in the bedroom? Use night lights. Make sure that you have a light by your bed that is easy to reach. Do not use any sheets or blankets that are too big for your bed. They should not hang down onto the floor. Have a firm chair that has side arms. You can use this for support while you get dressed. Do not have throw rugs and other things on the floor that can make you trip. What can I do in the kitchen? Clean up any spills  right away. Avoid walking on wet floors. Keep items that you use a lot in easy-to-reach places. If you need to reach something above you, use a strong step stool that has a grab bar. Keep electrical cords out of the way. Do not use floor polish or wax that makes floors slippery. If you must use wax, use non-skid floor wax. Do not have throw rugs and other things on the floor that can make you trip. What can I do with my stairs? Do not leave any items on the stairs. Make sure that there are handrails on both sides of the stairs and use them. Fix handrails that are broken or loose. Make sure that handrails are as long as the stairways. Check any carpeting to make sure that it is firmly attached to the stairs. Fix any carpet that is loose or worn. Avoid having throw rugs at the top or bottom of the stairs. If you do have throw rugs, attach them to the floor with carpet tape. Make sure that you have a light switch at the top of the stairs and the bottom of the  stairs. If you do not have them, ask someone to add them for you. What else can I do to help prevent falls? Wear shoes that: Do not have high heels. Have rubber bottoms. Are comfortable and fit you well. Are closed at the toe. Do not wear sandals. If you use a stepladder: Make sure that it is fully opened. Do not climb a closed stepladder. Make sure that both sides of the stepladder are locked into place. Ask someone to hold it for you, if possible. Clearly mark and make sure that you can see: Any grab bars or handrails. First and last steps. Where the edge of each step is. Use tools that help you move around (mobility aids) if they are needed. These include: Canes. Walkers. Scooters. Crutches. Turn on the lights when you go into a dark area. Replace any light bulbs as soon as they burn out. Set up your furniture so you have a clear path. Avoid moving your furniture around. If any of your floors are uneven, fix them. If there are any pets around you, be aware of where they are. Review your medicines with your doctor. Some medicines can make you feel dizzy. This can increase your chance of falling. Ask your doctor what other things that you can do to help prevent falls. This information is not intended to replace advice given to you by your health care provider. Make sure you discuss any questions you have with your health care provider. Document Released: 10/30/2008 Document Revised: 06/11/2015 Document Reviewed: 02/07/2014 Elsevier Interactive Patient Education  2017 [employer].

## 2022-04-04 ENCOUNTER — Other Ambulatory Visit: Payer: Self-pay | Admitting: Family Medicine

## 2022-04-04 DIAGNOSIS — Z961 Presence of intraocular lens: Secondary | ICD-10-CM | POA: Diagnosis not present

## 2022-04-04 DIAGNOSIS — H35373 Puckering of macula, bilateral: Secondary | ICD-10-CM | POA: Diagnosis not present

## 2022-04-04 DIAGNOSIS — H18513 Endothelial corneal dystrophy, bilateral: Secondary | ICD-10-CM | POA: Diagnosis not present

## 2022-04-04 NOTE — Telephone Encounter (Signed)
Refill request Alprazolam Last refill 02/08/22 #30/1 Last office visit 10/06/21 No upcoming appointment scheduled

## 2022-04-05 NOTE — Telephone Encounter (Signed)
ERx 

## 2022-04-13 ENCOUNTER — Ambulatory Visit
Payer: Medicare Other | Attending: Cardiovascular Disease | Admitting: Pharmacist Clinician (PhC)/ Clinical Pharmacy Specialist

## 2022-04-13 DIAGNOSIS — I4819 Other persistent atrial fibrillation: Secondary | ICD-10-CM

## 2022-04-13 DIAGNOSIS — Z5181 Encounter for therapeutic drug level monitoring: Secondary | ICD-10-CM | POA: Diagnosis not present

## 2022-04-13 LAB — POCT INR: INR: 2.8 (ref 2.0–3.0)

## 2022-04-13 NOTE — Patient Instructions (Signed)
Continue on same dosage of Warfarin 1 tablet daily except 1.5 tablets on Fridays.  Recheck in 4 weeks.

## 2022-05-02 ENCOUNTER — Other Ambulatory Visit: Payer: Self-pay | Admitting: Family Medicine

## 2022-05-02 ENCOUNTER — Other Ambulatory Visit: Payer: Self-pay | Admitting: Cardiovascular Disease

## 2022-05-02 NOTE — Telephone Encounter (Signed)
Refill request Alprazolam Last refill 04/05/22 #30 Last office visit 10/06/21

## 2022-05-03 NOTE — Telephone Encounter (Signed)
ERx 

## 2022-05-05 ENCOUNTER — Other Ambulatory Visit: Payer: Self-pay | Admitting: Cardiovascular Disease

## 2022-05-11 ENCOUNTER — Ambulatory Visit: Payer: Medicare Other | Attending: Cardiovascular Disease

## 2022-05-11 DIAGNOSIS — Z5181 Encounter for therapeutic drug level monitoring: Secondary | ICD-10-CM | POA: Diagnosis not present

## 2022-05-11 DIAGNOSIS — I4819 Other persistent atrial fibrillation: Secondary | ICD-10-CM | POA: Diagnosis not present

## 2022-05-11 LAB — POCT INR: INR: 3.1 — AB (ref 2.0–3.0)

## 2022-05-11 NOTE — Patient Instructions (Signed)
Description   Take 1/2 tablet today, then resume same dosage of Warfarin 1 tablet daily except 1.5 tablets on Fridays.  Recheck in 4 weeks.

## 2022-05-23 NOTE — Progress Notes (Unsigned)
Cardiology Office Note  Date:  05/24/2022   ID:  Anthony Mccammon., DOB 08-Dec-1945, MRN [id number]  PCP:  Eustaquio Boyden, MD   Chief Complaint  Patient presents with   12 month follow up     "Doing well." Medications reviewed by the patient verbally.     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, on chronic warfarin 2012 smoked for 22 years  Permanent atrial fibrillation, prior successful cardioversion , then back into atrial fibrillation, he elected rate control strategy 2018 3.2 cm AAA who presents for followup  for coronary artery disease, hypertension, permanent atrial fibrillation dating back to 2018  LOV 04/2021 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,  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, 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 Still active at baseline  No significant lower extremity edema  No sx from afib, rate controlled On warfarin  Concerned about his abdominal dilation AAA 3.2 cm on recent ultrasound In 2020 on CT scan aorta 3.0 cm  Wife passed February 2022, ALS Had been getting sick over the past 6 yrs, falling, muscles and balance getting worse At the end required tube feeding  EKG personally reviewed by myself on todays visit Atrial fibrillation rate 75 bpm no significant ST-T wave changes  records reviewed Cellulitis left leg  ER 03/06/21 Treated with ABX  Permanent atrial fibrillation starting back in 2018 Asymptomatic from his atrial fibrillation Prefered to stay on warfarin  In the hospital; 08/2018 CT scan reviewed in detail acute pancreatitis. Aortic Atherosclerosis also noted  Prior CT scan of the chest from 2014   LAD and left circumflex  coronary artery disease   He wears compression hose given his history of DVT    PMH:   has a past medical history of AAA (abdominal aortic aneurysm) (HCC) (2021), Anxiety, CAD (coronary artery disease), Cellulitis of left leg (02/19/2021), Clotting disorder (HCC), Colon polyps, Cough, Deep vein thrombosis (DVT) (HCC) (04/06/2010), Depression, DVT (deep venous thrombosis) (HCC), GERD (gastroesophageal reflux disease), Hyperlipidemia, Hypertension, IBS (irritable bowel syndrome), NICM (nonischemic cardiomyopathy) (HCC), Palpitations, Persistent atrial fibrillation (HCC), Pneumonia, Presence of partial dental prosthetic device, and Seasonal allergies.  PSH:    Past Surgical History:  Procedure Laterality Date   APPENDECTOMY     1967   CARDIOVERSION N/A 08/03/2016   Procedure: CARDIOVERSION;  Surgeon: Antonieta Iba, MD;  Location: [facility];  Service: Cardiovascular;  Laterality: N/A;   CATARACT EXTRACTION W/PHACO Right 07/08/2015   Procedure: CATARACT EXTRACTION PHACO AND INTRAOCULAR LENS PLACEMENT (IOC) RIGHT EYE;  Surgeon: Lockie Mola, MD;  Location: [facility];  Service: Ophthalmology;  Laterality: Right;   SYMFONY LENS   CATARACT EXTRACTION W/PHACO Left 08/05/2015   Procedure: CATARACT EXTRACTION PHACO AND INTRAOCULAR LENS PLACEMENT (IOC) left eye;  Surgeon: Lockie Mola, MD;  Location: [facility];  Service: Ophthalmology;  Laterality: Left;  SYMFONY LENS   CATARACT EXTRACTION, BILATERAL Bilateral 08/2015   COLONOSCOPY     x3   POLYPECTOMY     TONSILLECTOMY AND ADENOIDECTOMY      Current Outpatient Medications  Medication Sig Dispense Refill   acetaminophen (TYLENOL) 650 MG CR tablet Take 650 mg by mouth 2 (two) times daily.  ALPRAZolam (XANAX XR) 0.5 MG 24 hr tablet Take 1 tablet (0.5 mg total) by mouth daily. 30 tablet 0   atorvastatin (LIPITOR) 40 MG tablet Take 1 tablet (40 mg total) by mouth daily at 6 PM. 90 tablet 0   buPROPion (WELLBUTRIN XL)  150 MG 24 hr tablet Take 1 tablet (150 mg total) by mouth daily. 90 tablet 3   cloNIDine (CATAPRES) 0.1 MG tablet Take 1 tablet (0.1 mg total) by mouth 2 (two) times daily. 60 tablet 0   ezetimibe (ZETIA) 10 MG tablet Take 1 tablet (10 mg total) by mouth daily. 90 tablet 3   fexofenadine (ALLERGY RELIEF) 180 MG tablet TAKE ONE TABLET DAILY FOR CONGESTION. 90 tablet 3   metoprolol tartrate (LOPRESSOR) 25 MG tablet Take 1 tablet (25 mg total) by mouth 2 (two) times daily. 180 tablet 0   Multiple Vitamin (MULTIVITAMIN PO) Take 1 tablet by mouth daily. pm     omeprazole (PRILOSEC) 20 MG capsule Take 1 capsule (20 mg total) by mouth daily. 90 capsule 3   warfarin (COUMADIN) 5 MG tablet Take 1 tablet to 1 1/2 tablets by mouth daily or as directed by the coumadin clinic. 40 tablet 5   benzonatate (TESSALON) 100 MG capsule Take 1 capsule (100 mg total) by mouth 3 (three) times daily as needed for cough. (Patient not taking: Reported on 03/23/2022) 30 capsule 0   meclizine (ANTIVERT) 25 MG tablet Take 25 mg by mouth 3 (three) times daily as needed for dizziness. Reported on 07/01/2015 (Patient not taking: Reported on 03/23/2022)     No current facility-administered medications for this visit.     Allergies:   Iodine   Social History:  The patient  reports that he quit smoking about 30 years ago. His smoking use included cigarettes. He has a 30.00 pack-year smoking history. He has never been exposed to tobacco smoke. He has never used smokeless tobacco. He reports current alcohol use of about 12.0 standard drinks of alcohol per week. He reports that he does not use drugs.   Family History:   family history includes Heart attack in his father; Heart disease in his mother; Hypertension in his father and mother; Irritable bowel syndrome in his mother; Prostate cancer in his father.    Review of Systems: Review of Systems  Constitutional: Negative.   HENT: Negative.    Respiratory: Negative.     Cardiovascular:  Positive for leg swelling.  Gastrointestinal: Negative.   Musculoskeletal: Negative.   Neurological: Negative.   Psychiatric/Behavioral: Negative.    All other systems reviewed and are negative.   PHYSICAL EXAM: VS:  BP (!) 140/88 (BP Location: Left Arm, Patient Position: Sitting, Cuff Size: Normal)   Pulse 75   Ht 5\' 11"  (1.803 m)   Wt 232 lb 6 oz (105.4 kg)   SpO2 95%   BMI 32.41 kg/m  , BMI Body mass index is 32.41 kg/m.  Constitutional:  oriented to person, place, and time. No distress.  HENT:  Head: Grossly normal Eyes:  no discharge. No scleral icterus.  Neck: No JVD, no carotid bruits  Cardiovascular: Regular rate and rhythm, no murmurs appreciated Pulmonary/Chest: Clear to auscultation bilaterally, no wheezes or rails Abdominal: Soft.  no distension.  no tenderness.  Musculoskeletal: Normal range of motion Neurological:  normal muscle tone. Coordination normal. No atrophy Skin: Skin warm and dry Psychiatric: normal affect, pleasant  Recent Labs: 07/14/2021: ALT 23; BUN 22; Creatinine, Ser 1.17; Hemoglobin 14.8; Platelets 147.0; Potassium 4.3; Sodium  139    Lipid Panel Lab Results  Component Value Date   CHOL 132 07/14/2021   HDL 49.50 07/14/2021   LDLCALC 59 07/14/2021   TRIG 115.0 07/14/2021    Wt Readings from Last 3 Encounters:  05/24/22 232 lb 6 oz (105.4 kg)  03/23/22 227 lb 12.8 oz (103.3 kg)  10/06/21 222 lb 2 oz (100.8 kg)    ASSESSMENT AND PLAN:   Atrial fibrillation,persistent  Asymptomatic, rate controlled on warfarin Declining NOAC  Essential hypertension -  Blood pressure high end of the range, recommend close monitoring at home  HYPERCHOLESTEROLEMIA Continue Lipitor Zetia, numbers at goal  Localized edema Minimal edema on today's visit history of DVT, on warfarin Stable minimal edema on today's visit  History of DVT On anticoagulation  Obesity Active on the farm  Adjustment disorder  loss of his wife to  ALS, 2022 sons 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    Signed, Dossie Arbour, M.D., Ph.D. 05/24/2022  [facility] [place], [place] [phone]

## 2022-05-24 ENCOUNTER — Ambulatory Visit: Payer: Medicare Other | Attending: Cardiovascular Disease | Admitting: Cardiovascular Disease

## 2022-05-24 ENCOUNTER — Encounter: Payer: Self-pay | Admitting: Cardiovascular Disease

## 2022-05-24 VITALS — BP 135/85 | HR 75 | Ht 71.0 in | Wt 232.4 lb

## 2022-05-24 DIAGNOSIS — E7800 Pure hypercholesterolemia, unspecified: Secondary | ICD-10-CM | POA: Insufficient documentation

## 2022-05-24 DIAGNOSIS — I10 Essential (primary) hypertension: Secondary | ICD-10-CM | POA: Diagnosis not present

## 2022-05-24 DIAGNOSIS — I714 Abdominal aortic aneurysm, without rupture, unspecified: Secondary | ICD-10-CM | POA: Diagnosis not present

## 2022-05-24 DIAGNOSIS — Z86718 Personal history of other venous thrombosis and embolism: Secondary | ICD-10-CM | POA: Diagnosis not present

## 2022-05-24 DIAGNOSIS — I82491 Acute embolism and thrombosis of other specified deep vein of right lower extremity: Secondary | ICD-10-CM | POA: Insufficient documentation

## 2022-05-24 DIAGNOSIS — I4819 Other persistent atrial fibrillation: Secondary | ICD-10-CM | POA: Diagnosis not present

## 2022-05-24 MED ORDER — EZETIMIBE 10 MG PO TABS: 10.0000 mg | ORAL_TABLET | Freq: Every day | ORAL | 3 refills | Status: DC

## 2022-05-24 MED ORDER — ATORVASTATIN CALCIUM 40 MG PO TABS: 40.0000 mg | ORAL_TABLET | Freq: Every day | ORAL | 3 refills | Status: DC

## 2022-05-24 MED ORDER — CLONIDINE HCL 0.1 MG PO TABS: 0.1000 mg | ORAL_TABLET | Freq: Two times a day (BID) | ORAL | 3 refills | Status: DC

## 2022-05-24 MED ORDER — METOPROLOL TARTRATE 25 MG PO TABS: 25.0000 mg | ORAL_TABLET | Freq: Two times a day (BID) | ORAL | 3 refills | Status: DC

## 2022-05-24 NOTE — Patient Instructions (Signed)
Medication Instructions:  No changes  If you need a refill on your cardiac medications before your next appointment, please call your pharmacy.   Lab work: No new labs needed  Testing/Procedures: No new testing needed  Follow-Up: At [facility], you and your health needs are our priority.  As part of our continuing mission to provide you with exceptional heart care, we have created designated Provider Care Teams.  These Care Teams include your primary Cardiologist (physician) and Advanced Practice Providers (APPs -  Physician Assistants and Nurse Practitioners) who all work together to provide you with the care you need, when you need it.  You will need a follow up appointment in 12 months  Providers on your designated Care Team:   Christopher Berge, NP Ryan Dunn, PA-C Cadence Furth, PA-C  COVID-19 Vaccine Information can be found at: [email or web address] For questions related to vaccine distribution or appointments, please email [email or web address] or call [phone].   

## 2022-05-30 ENCOUNTER — Other Ambulatory Visit: Payer: Self-pay | Admitting: Family Medicine

## 2022-05-30 DIAGNOSIS — F331 Major depressive disorder, recurrent, moderate: Secondary | ICD-10-CM

## 2022-05-30 NOTE — Telephone Encounter (Signed)
Name of Medication: Alprazolam Name of Pharmacy: [facility] Last Fill or Written Date and Quantity: 05/03/22, #30 Last Office Visit and Type: 10/06/21, ER f/u Next Office Visit and Type: none Last Controlled Substance Agreement Date: 01/24/17 Last UDS: 01/24/17

## 2022-05-30 NOTE — Telephone Encounter (Signed)
ERx 

## 2022-06-08 ENCOUNTER — Encounter: Payer: Medicare Other | Admitting: Pharmacist

## 2022-06-08 ENCOUNTER — Ambulatory Visit: Payer: Medicare Other | Attending: Cardiovascular Disease

## 2022-06-08 DIAGNOSIS — Z5181 Encounter for therapeutic drug level monitoring: Secondary | ICD-10-CM | POA: Diagnosis not present

## 2022-06-08 DIAGNOSIS — I4819 Other persistent atrial fibrillation: Secondary | ICD-10-CM

## 2022-06-08 LAB — POCT INR: INR: 2 (ref 2.0–3.0)

## 2022-06-08 NOTE — Patient Instructions (Signed)
Continue Warfarin 1 tablet daily except 1.5 tablets on Fridays.  Recheck in 6 weeks.

## 2022-06-26 ENCOUNTER — Other Ambulatory Visit: Payer: Self-pay | Admitting: Family Medicine

## 2022-06-26 DIAGNOSIS — F331 Major depressive disorder, recurrent, moderate: Secondary | ICD-10-CM

## 2022-06-27 NOTE — Telephone Encounter (Signed)
Name of Medication: Alprazolam Name of Pharmacy: [facility] Last Fill or Written Date and Quantity: 05/30/22, #30 Last Office Visit and Type: 10/06/21, ER f/u Next Office Visit and Type: none Last Controlled Substance Agreement Date: 01/24/17 Last UDS: 01/24/17

## 2022-06-28 NOTE — Telephone Encounter (Signed)
ERx 

## 2022-06-29 DIAGNOSIS — H903 Sensorineural hearing loss, bilateral: Secondary | ICD-10-CM | POA: Diagnosis not present

## 2022-06-29 DIAGNOSIS — H6123 Impacted cerumen, bilateral: Secondary | ICD-10-CM | POA: Diagnosis not present

## 2022-07-20 ENCOUNTER — Ambulatory Visit: Payer: Medicare Other | Attending: Cardiovascular Disease

## 2022-07-20 DIAGNOSIS — Z5181 Encounter for therapeutic drug level monitoring: Secondary | ICD-10-CM | POA: Insufficient documentation

## 2022-07-20 DIAGNOSIS — I4819 Other persistent atrial fibrillation: Secondary | ICD-10-CM | POA: Diagnosis not present

## 2022-07-20 LAB — POCT INR: INR: 3.4 — AB (ref 2.0–3.0)

## 2022-07-20 NOTE — Patient Instructions (Signed)
TAKE ONLY 0.5 TABLET TONIGHT THEN Continue Warfarin 1 tablet daily except 1.5 tablets on Fridays.  Recheck in 6 weeks.

## 2022-07-23 ENCOUNTER — Other Ambulatory Visit: Payer: Self-pay | Admitting: Family Medicine

## 2022-07-23 DIAGNOSIS — J309 Allergic rhinitis, unspecified: Secondary | ICD-10-CM

## 2022-07-25 NOTE — Telephone Encounter (Signed)
Noted  

## 2022-07-25 NOTE — Telephone Encounter (Signed)
Patient has been scheduled

## 2022-07-25 NOTE — Telephone Encounter (Signed)
E-scribed refill  Plz schedule CPE and fasting lab (no food/drink- except water and/or blk coffee 5 hrs prior) visits for additional refills.  

## 2022-08-11 ENCOUNTER — Other Ambulatory Visit: Payer: Self-pay | Admitting: Gastroenterology

## 2022-08-11 ENCOUNTER — Other Ambulatory Visit: Payer: Self-pay | Admitting: Cardiovascular Disease

## 2022-08-11 ENCOUNTER — Other Ambulatory Visit: Payer: Self-pay | Admitting: Family Medicine

## 2022-08-11 DIAGNOSIS — I4819 Other persistent atrial fibrillation: Secondary | ICD-10-CM

## 2022-08-11 DIAGNOSIS — F331 Major depressive disorder, recurrent, moderate: Secondary | ICD-10-CM

## 2022-08-11 NOTE — Telephone Encounter (Signed)
Refill Request.  

## 2022-08-11 NOTE — Telephone Encounter (Signed)
Prescription refill request received for warfarin Lov: 05/24/22 Mariah Milling)  Next INR check: 08/31/22 Warfarin tablet strength: 5mg   Appropriate dose. Refill sent.

## 2022-08-11 NOTE — Telephone Encounter (Signed)
Name of Medication:  Alprazolam Name of Pharmacy:  [facility] Last Fill or Written Date and Quantity:  07/15/22, #30 Last Office Visit and Type:  10/06/21, ER f/u Next Office Visit and Type:  11/22/22, CPE Last Controlled Substance Agreement Date:  01/24/17 Last UDS:  01/24/17

## 2022-08-12 NOTE — Telephone Encounter (Signed)
ERx 

## 2022-08-31 ENCOUNTER — Ambulatory Visit: Payer: Medicare Other | Attending: Cardiovascular Disease

## 2022-08-31 DIAGNOSIS — I4819 Other persistent atrial fibrillation: Secondary | ICD-10-CM | POA: Diagnosis not present

## 2022-08-31 DIAGNOSIS — Z5181 Encounter for therapeutic drug level monitoring: Secondary | ICD-10-CM | POA: Insufficient documentation

## 2022-08-31 LAB — POCT INR: INR: 2.5 (ref 2.0–3.0)

## 2022-08-31 NOTE — Patient Instructions (Signed)
Continue Warfarin 1 tablet daily except 1.5 tablets on Fridays.  Recheck in 6 weeks.

## 2022-09-03 ENCOUNTER — Other Ambulatory Visit: Payer: Self-pay | Admitting: Cardiovascular Disease

## 2022-09-03 DIAGNOSIS — I4819 Other persistent atrial fibrillation: Secondary | ICD-10-CM

## 2022-09-05 NOTE — Telephone Encounter (Signed)
Refill request

## 2022-09-07 ENCOUNTER — Other Ambulatory Visit: Payer: Self-pay | Admitting: Family Medicine

## 2022-09-07 DIAGNOSIS — F331 Major depressive disorder, recurrent, moderate: Secondary | ICD-10-CM

## 2022-09-07 NOTE — Telephone Encounter (Signed)
Medication: Alprazolam (Xanax) 0.5 mg Directions: Take 1 tablet by mouth daily Last given: 08/12/22 Number refills: 0 Last o/v: 10/06/21 Follow up: 11/22/22 (CPE) Labs:

## 2022-09-09 NOTE — Telephone Encounter (Signed)
ERx 

## 2022-10-02 ENCOUNTER — Other Ambulatory Visit: Payer: Self-pay | Admitting: Family Medicine

## 2022-10-06 ENCOUNTER — Other Ambulatory Visit: Payer: Self-pay | Admitting: Cardiovascular Disease

## 2022-10-06 ENCOUNTER — Other Ambulatory Visit: Payer: Self-pay | Admitting: Family Medicine

## 2022-10-06 DIAGNOSIS — F331 Major depressive disorder, recurrent, moderate: Secondary | ICD-10-CM

## 2022-10-06 DIAGNOSIS — I4819 Other persistent atrial fibrillation: Secondary | ICD-10-CM

## 2022-10-07 NOTE — Telephone Encounter (Signed)
ERx 

## 2022-10-12 ENCOUNTER — Ambulatory Visit: Payer: Medicare Other | Attending: Cardiovascular Disease

## 2022-10-12 DIAGNOSIS — Z5181 Encounter for therapeutic drug level monitoring: Secondary | ICD-10-CM | POA: Insufficient documentation

## 2022-10-12 DIAGNOSIS — I4819 Other persistent atrial fibrillation: Secondary | ICD-10-CM | POA: Diagnosis not present

## 2022-10-12 LAB — POCT INR: INR: 2.6 (ref 2.0–3.0)

## 2022-10-12 NOTE — Patient Instructions (Signed)
Continue Warfarin 1 tablet daily except 1.5 tablets on Fridays.  Recheck in 6 weeks.

## 2022-11-03 ENCOUNTER — Other Ambulatory Visit: Payer: Self-pay | Admitting: Family Medicine

## 2022-11-03 DIAGNOSIS — F331 Major depressive disorder, recurrent, moderate: Secondary | ICD-10-CM

## 2022-11-04 NOTE — Telephone Encounter (Signed)
ERx 

## 2022-11-06 ENCOUNTER — Other Ambulatory Visit: Payer: Self-pay | Admitting: Gastroenterology

## 2022-11-11 ENCOUNTER — Other Ambulatory Visit: Payer: Self-pay | Admitting: Family Medicine

## 2022-11-11 DIAGNOSIS — J309 Allergic rhinitis, unspecified: Secondary | ICD-10-CM

## 2022-11-11 NOTE — Telephone Encounter (Signed)
Patient has not been seen in a year but has appointment on 11/5. Ok to refill as pended.

## 2022-11-13 ENCOUNTER — Other Ambulatory Visit: Payer: Self-pay | Admitting: Cardiovascular Disease

## 2022-11-13 DIAGNOSIS — I4819 Other persistent atrial fibrillation: Secondary | ICD-10-CM

## 2022-11-14 ENCOUNTER — Other Ambulatory Visit: Payer: Self-pay | Admitting: Family Medicine

## 2022-11-14 DIAGNOSIS — E7800 Pure hypercholesterolemia, unspecified: Secondary | ICD-10-CM

## 2022-11-14 DIAGNOSIS — I4819 Other persistent atrial fibrillation: Secondary | ICD-10-CM

## 2022-11-14 DIAGNOSIS — N289 Disorder of kidney and ureter, unspecified: Secondary | ICD-10-CM

## 2022-11-14 DIAGNOSIS — Z8042 Family history of malignant neoplasm of prostate: Secondary | ICD-10-CM

## 2022-11-14 NOTE — Telephone Encounter (Signed)
ERx 

## 2022-11-15 ENCOUNTER — Other Ambulatory Visit (INDEPENDENT_AMBULATORY_CARE_PROVIDER_SITE_OTHER): Payer: Medicare Other

## 2022-11-15 DIAGNOSIS — I4819 Other persistent atrial fibrillation: Secondary | ICD-10-CM

## 2022-11-15 DIAGNOSIS — E7800 Pure hypercholesterolemia, unspecified: Secondary | ICD-10-CM

## 2022-11-15 LAB — CBC WITH DIFFERENTIAL/PLATELET
Basophils Absolute: 0 10*3/uL (ref 0.0–0.1)
Basophils Relative: 0.5 % (ref 0.0–3.0)
Eosinophils Absolute: 0.2 10*3/uL (ref 0.0–0.7)
Eosinophils Relative: 2.5 % (ref 0.0–5.0)
HCT: 43.3 % (ref 39.0–52.0)
Hemoglobin: 14.1 g/dL (ref 13.0–17.0)
Lymphocytes Relative: 33.8 % (ref 12.0–46.0)
Lymphs Abs: 2.5 10*3/uL (ref 0.7–4.0)
MCHC: 32.5 g/dL (ref 30.0–36.0)
MCV: 97.6 fL (ref 78.0–100.0)
Monocytes Absolute: 0.6 10*3/uL (ref 0.1–1.0)
Monocytes Relative: 8.2 % (ref 3.0–12.0)
Neutro Abs: 4.1 10*3/uL (ref 1.4–7.7)
Neutrophils Relative %: 55 % (ref 43.0–77.0)
Platelets: 169 10*3/uL (ref 150.0–400.0)
RBC: 4.44 Mil/uL (ref 4.22–5.81)
RDW: 14 % (ref 11.5–15.5)
WBC: 7.4 10*3/uL (ref 4.0–10.5)

## 2022-11-15 LAB — COMPREHENSIVE METABOLIC PANEL
ALT: 31 U/L (ref 0–53)
AST: 23 U/L (ref 0–37)
Albumin: 3.8 g/dL (ref 3.5–5.2)
Alkaline Phosphatase: 54 U/L (ref 39–117)
BUN: 19 mg/dL (ref 6–23)
CO2: 30 meq/L (ref 19–32)
Calcium: 8.8 mg/dL (ref 8.4–10.5)
Chloride: 105 meq/L (ref 96–112)
Creatinine, Ser: 1.06 mg/dL (ref 0.40–1.50)
GFR: 67.28 mL/min (ref >60.00)
Glucose, Bld: 95 mg/dL (ref 70–99)
Potassium: 4.3 meq/L (ref 3.5–5.1)
Sodium: 141 meq/L (ref 135–145)
Total Bilirubin: 1 mg/dL (ref 0.2–1.2)
Total Protein: 6.2 g/dL (ref 6.0–8.3)

## 2022-11-15 LAB — LIPID PANEL
Cholesterol: 123 mg/dL (ref 0–200)
HDL: 42.8 mg/dL (ref >39.00)
LDL Cholesterol: 52 mg/dL (ref 0–99)
NonHDL: 79.71
Total CHOL/HDL Ratio: 3
Triglycerides: 139 mg/dL (ref 0.0–149.0)
VLDL: 27.8 mg/dL (ref 0.0–40.0)

## 2022-11-22 ENCOUNTER — Ambulatory Visit (INDEPENDENT_AMBULATORY_CARE_PROVIDER_SITE_OTHER)
Admission: RE | Admit: 2022-11-22 | Discharge: 2022-11-22 | Disposition: A | Discharge status: Home / Self Care | Payer: Medicare Other | Source: Ambulatory Visit | Attending: Family Medicine | Admitting: Family Medicine

## 2022-11-22 ENCOUNTER — Encounter: Payer: Self-pay | Admitting: Family Medicine

## 2022-11-22 ENCOUNTER — Ambulatory Visit: Payer: Medicare Other | Admitting: Family Medicine

## 2022-11-22 VITALS — BP 170/88 | HR 68 | Temp 97.8°F | Ht 69.5 in | Wt 228.0 lb

## 2022-11-22 DIAGNOSIS — R052 Subacute cough: Secondary | ICD-10-CM

## 2022-11-22 DIAGNOSIS — R059 Cough, unspecified: Secondary | ICD-10-CM | POA: Diagnosis not present

## 2022-11-22 DIAGNOSIS — F331 Major depressive disorder, recurrent, moderate: Secondary | ICD-10-CM

## 2022-11-22 DIAGNOSIS — N289 Disorder of kidney and ureter, unspecified: Secondary | ICD-10-CM

## 2022-11-22 DIAGNOSIS — F419 Anxiety disorder, unspecified: Secondary | ICD-10-CM | POA: Diagnosis not present

## 2022-11-22 DIAGNOSIS — I10 Essential (primary) hypertension: Secondary | ICD-10-CM

## 2022-11-22 DIAGNOSIS — Z86718 Personal history of other venous thrombosis and embolism: Secondary | ICD-10-CM | POA: Diagnosis not present

## 2022-11-22 DIAGNOSIS — E66811 Obesity, class 1: Secondary | ICD-10-CM

## 2022-11-22 DIAGNOSIS — E7800 Pure hypercholesterolemia, unspecified: Secondary | ICD-10-CM

## 2022-11-22 DIAGNOSIS — I714 Abdominal aortic aneurysm, without rupture, unspecified: Secondary | ICD-10-CM

## 2022-11-22 DIAGNOSIS — I700 Atherosclerosis of aorta: Secondary | ICD-10-CM

## 2022-11-22 DIAGNOSIS — I4819 Other persistent atrial fibrillation: Secondary | ICD-10-CM

## 2022-11-22 DIAGNOSIS — R9389 Abnormal findings on diagnostic imaging of other specified body structures: Secondary | ICD-10-CM | POA: Diagnosis not present

## 2022-11-22 DIAGNOSIS — R062 Wheezing: Secondary | ICD-10-CM | POA: Diagnosis not present

## 2022-11-22 DIAGNOSIS — Z7901 Long term (current) use of anticoagulants: Secondary | ICD-10-CM | POA: Diagnosis not present

## 2022-11-22 DIAGNOSIS — R0600 Dyspnea, unspecified: Secondary | ICD-10-CM | POA: Diagnosis not present

## 2022-11-22 DIAGNOSIS — K219 Gastro-esophageal reflux disease without esophagitis: Secondary | ICD-10-CM

## 2022-11-22 DIAGNOSIS — Z7189 Other specified counseling: Secondary | ICD-10-CM

## 2022-11-22 DIAGNOSIS — G4700 Insomnia, unspecified: Secondary | ICD-10-CM

## 2022-11-22 MED ORDER — LOSARTAN POTASSIUM 25 MG PO TABS: 25.0000 mg | ORAL_TABLET | Freq: Every day | ORAL | 6 refills | Status: DC

## 2022-11-22 MED ORDER — BUPROPION HCL ER (XL) 150 MG PO TB24: 150.0000 mg | ORAL_TABLET | Freq: Every day | ORAL | 4 refills | Status: DC

## 2022-11-22 MED ORDER — OMEPRAZOLE 20 MG PO CPDR: 20.0000 mg | DELAYED_RELEASE_CAPSULE | Freq: Every day | ORAL | 4 refills | Status: DC

## 2022-11-22 NOTE — Assessment & Plan Note (Addendum)
Advanced directive discussion - son Tomasa Hose is HCPOA followed by DIL Ulice Dash. Scanned 07/2021. Ok with CPR. Would not want life support if recovery futile. Unsure about feeding tube.

## 2022-11-22 NOTE — Progress Notes (Unsigned)
Ph: [phone] Fax: [phone]   Patient ID: Anthony Kim., male    DOB: [birth year], 77 y.o.   MRN: [id number]  This visit was conducted in person.  BP (!) 170/88 (BP Location: Right Arm, Cuff Size: Large)   Pulse 68   Temp 97.8 F (36.6 C) (Oral)   Ht 5' 9.5" (1.765 m)   Wt 228 lb (103.4 kg)   SpO2 98%   BMI 33.19 kg/m   BP Readings from Last 3 Encounters:  11/22/22 (!) 170/88  05/24/22 135/85  10/06/21 136/82   CC: CPE Subjective:   HPI: Anthony Kim. is a 78 y.o. male presenting on 11/22/2022 for Annual Exam (MCR prt 2 [AWV- 03/23/22].)   Saw health advisor 03/2022 for medicare wellness visit. Note reviewed.     03/23/2022   11:56 AM  6CIT Screen  What Year? 0 points  What month? 0 points  What time? 0 points  Count back from 20 0 points  Months in reverse 0 points  Repeat phrase 4 points  Total Score 4 points   No results found.  Flowsheet Row Clinical Support from 03/23/2022 in [facility] [place]  PHQ-2 Total Score 0          03/23/2022   11:47 AM 03/10/2021    2:02 PM 03/05/2020   11:48 AM 07/03/2018   12:06 PM 06/22/2017   10:15 AM  Fall Risk   Falls in the past year? 0 0 0 0 No  Number falls in past yr: 0 0 0    Injury with Fall? 0 0 0    Risk for fall due to : No Fall Risks No Fall Risks Medication side effect    Follow up Falls prevention discussed;Falls evaluation completed Falls prevention discussed Falls evaluation completed;Falls prevention discussed     Not feeling well for the past 4 weeks - started with fever, cough, congestion. Initial improvement, then deterioration over the past 1.5 weeks. Chest congestion, dry cough, wheezing and dyspnea. No further fevers/chills. No abd pain, nausea, diarrhea, headache, ear or tooth pain, ST. Had lobar pneumonia 09/2021. No h/o asthma, COPD. Ex smoker - 1.5 ppd x 30 yrs, quit 1990s.   Atrial fibrillation on coumadin, metoprolol, clonidine.  He stopped xanax 2 months  ago, has felt well off this. Continues wellbutrin XL 150mg  daily.   HTN - Compliant with current antihypertensive regimen of clonidine 0.1mg  bid, metoprolol 25mg  BID. Does not check blood pressures at home. Has BP cuff. No low blood pressure readings or symptoms of dizziness/syncope.  Denies HA, vision changes, CP/tightness, SOB, leg swelling.   GERD - saw Dr Adela Lank GI last year - to consider EGD to screen for Barrett's, continues omepraozle 20mg  daily with good effect.   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    Preventative: Colonoscopy 2013 WNL, rpt 5 yrs --> changed to 10 yrs Arlyce Dice) will be due this year 2023 Adela Lank) -  Prostate cancer screening - had seen urology with PSA/DRE ([name] Father with h/o prostate cancer s/p brachytherapy with good effect.  Lung cancer screening - not eligible Flu shot -yearly COVID vaccine Moderna 02/2019, 03/2019, booster 11/2019 (states he didn't have this) Td 2010 Pneumovax 2013, prevnar-13 2015 Zostavax - 2010 Shingrix - discussed Advanced directive discussion - son Tomasa Hose is HCPOA followed by DIL Ulice Dash. Scanned 07/2021. Ok with CPR. Would not want life support if recovery futile. Unsure  about feeding tube.  Seat belt use discussed  Sunscreen use discussed. No changing moles on skin.  Ex - smoker - 30+ PY hx, quit cold [place] 1990s  Alcohol - 2-3 glasses wine nightly  Dentist - q6 mo Eye exam - yearly. S/p cataract surgery. Discussing corneal transplant.  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 Interim medical history since our last visit reviewed. Allergies and medications  reviewed and updated. Outpatient Medications Prior to Visit  Medication Sig Dispense Refill  . acetaminophen (TYLENOL) 650 MG CR tablet Take 650 mg by mouth 2 (two) times daily.    . ALLERGY RELIEF 180 MG tablet TAKE ONE TABLET DAILY FOR CONGESTION. 90 tablet 0  . atorvastatin (LIPITOR) 40 MG tablet Take 1 tablet (40 mg total) by mouth daily at 6 PM. 90 tablet 3  . cloNIDine (CATAPRES) 0.1 MG tablet Take 1 tablet (0.1 mg total) by mouth 2 (two) times daily. 180 tablet 3  . ezetimibe (ZETIA) 10 MG tablet Take 1 tablet (10 mg total) by mouth daily. 90 tablet 3  . meclizine (ANTIVERT) 25 MG tablet Take 25 mg by mouth 3 (three) times daily as needed for dizziness. Reported on 07/01/2015    . metoprolol tartrate (LOPRESSOR) 25 MG tablet Take 1 tablet (25 mg total) by mouth 2 (two) times daily. 180 tablet 3  . Multiple Vitamin (MULTIVITAMIN PO) Take 1 tablet by mouth daily. pm    . warfarin (COUMADIN) 5 MG tablet Take 1/2 a tablet to 1 tablet by mouth daily or as directed by the coumadin clinic. 40 tablet 0  . benzonatate (TESSALON) 100 MG capsule Take 1 capsule (100 mg total) by mouth 3 (three) times daily as needed for cough. 30 capsule 0  . buPROPion (WELLBUTRIN XL) 150 MG 24 hr tablet Take 1 tablet (150 mg total) by mouth daily. 90 tablet 0  . omeprazole (PRILOSEC) 20 MG capsule Take 1 capsule (20 mg total) by mouth daily. Please schedule a yearly follow up for additional refills. Thanks 90 capsule 0  . ALPRAZolam (XANAX XR) 0.5 MG 24 hr tablet Take 1 tablet (0.5 mg total) by mouth daily. 30 tablet 3   No facility-administered medications prior to visit.     Per HPI unless specifically indicated in ROS section below Review of Systems  Objective:  BP (!) 170/88 (BP Location: Right Arm, Cuff Size: Large)   Pulse 68   Temp 97.8 F (36.6 C) (Oral)   Ht 5' 9.5" (1.765 m)   Wt 228 lb (103.4 kg)   SpO2 98%   BMI 33.19 kg/m   Wt Readings from Last 3 Encounters:  11/22/22 228 lb (103.4 kg)   05/24/22 232 lb 6 oz (105.4 kg)  03/23/22 227 lb 12.8 oz (103.3 kg)      Physical Exam Vitals and nursing note reviewed.  Constitutional:      General: He is not in acute distress.    Appearance: Normal appearance. He is well-developed. He is not ill-appearing.  HENT:     Head: Normocephalic and atraumatic.     Right Ear: Hearing, tympanic membrane, ear canal and external ear normal.     Left Ear:  Hearing, tympanic membrane, ear canal and external ear normal.     Mouth/Throat:     Mouth: Mucous membranes are moist.     Pharynx: Oropharynx is clear. No oropharyngeal exudate or posterior oropharyngeal erythema.  Eyes:     General: No scleral icterus.    Extraocular Movements: Extraocular movements intact.     Conjunctiva/sclera: Conjunctivae normal.     Pupils: Pupils are equal, round, and reactive to light.  Neck:     Thyroid: No thyroid mass or thyromegaly.     Vascular: No carotid bruit.  Cardiovascular:     Rate and Rhythm: Normal rate. Rhythm irregularly irregular.     Pulses: Normal pulses.          Radial pulses are 2+ on the right side and 2+ on the left side.     Heart sounds: Normal heart sounds. No murmur heard. Pulmonary:     Effort: Pulmonary effort is normal. No respiratory distress.     Breath sounds: Normal breath sounds. No wheezing, rhonchi or rales.     Comments: Lungs largely clear Abdominal:     General: Bowel sounds are normal. There is no distension.     Palpations: Abdomen is soft. There is no mass.     Tenderness: There is no abdominal tenderness. There is no guarding or rebound.     Hernia: No hernia is present.  Musculoskeletal:        General: Normal range of motion.     Cervical back: Normal range of motion and neck supple.     Right lower leg: No edema.     Left lower leg: No edema.  Lymphadenopathy:     Cervical: No cervical adenopathy.  Skin:    General: Skin is warm and dry.     Findings: No rash.  Neurological:     General: No focal  deficit present.     Mental Status: He is alert and oriented to person, place, and time.  Psychiatric:        Mood and Affect: Mood normal.        Behavior: Behavior normal.        Thought Content: Thought content normal.        Judgment: Judgment normal.      Results for orders placed or performed in visit on 11/15/22  CBC with Differential/Platelet  Result Value Ref Range   WBC 7.4 4.0 - 10.5 K/uL   RBC 4.44 4.22 - 5.81 Mil/uL   Hemoglobin 14.1 13.0 - 17.0 g/dL   HCT 16.1 09.6 - 04.5 %   MCV 97.6 78.0 - 100.0 fl   MCHC 32.5 30.0 - 36.0 g/dL   RDW 40.9 81.1 - 91.4 %   Platelets 169.0 150.0 - 400.0 K/uL   Neutrophils Relative % 55.0 43.0 - 77.0 %   Lymphocytes Relative 33.8 12.0 - 46.0 %   Monocytes Relative 8.2 3.0 - 12.0 %   Eosinophils Relative 2.5 0.0 - 5.0 %   Basophils Relative 0.5 0.0 - 3.0 %   Neutro Abs 4.1 1.4 - 7.7 K/uL   Lymphs Abs 2.5 0.7 - 4.0 K/uL   Monocytes Absolute 0.6 0.1 - 1.0 K/uL   Eosinophils Absolute 0.2 0.0 - 0.7 K/uL   Basophils Absolute 0.0 0.0 - 0.1 K/uL  Comprehensive metabolic panel  Result Value Ref Range   Sodium 141 135 - 145 mEq/L   Potassium 4.3 3.5 - 5.1 mEq/L   Chloride 105 96 - 112 mEq/L   CO2  30 19 - 32 mEq/L   Glucose, Bld 95 70 - 99 mg/dL   BUN 19 6 - 23 mg/dL   Creatinine, Ser 8.29 0.40 - 1.50 mg/dL   Total Bilirubin 1.0 0.2 - 1.2 mg/dL   Alkaline Phosphatase 54 39 - 117 U/L   AST 23 0 - 37 U/L   ALT 31 0 - 53 U/L   Total Protein 6.2 6.0 - 8.3 g/dL   Albumin 3.8 3.5 - 5.2 g/dL   GFR 56.21 >30.86 mL/min   Calcium 8.8 8.4 - 10.5 mg/dL  Lipid panel  Result Value Ref Range   Cholesterol 123 0 - 200 mg/dL   Triglycerides 578.4 0.0 - 149.0 mg/dL   HDL 69.62 >95.28 mg/dL   VLDL 41.3 0.0 - 24.4 mg/dL   LDL Cholesterol 52 0 - 99 mg/dL   Total CHOL/HDL Ratio 3    NonHDL 79.71    Lab Results  Component Value Date   PSA 0.28 07/14/2021   PSA 0.34 03/11/2020   PSA 0.56 10/18/2018   Assessment & Plan:   Problem List Items  Addressed This Visit     Advanced care planning/counseling discussion - Primary (Chronic)    Advanced directive discussion - son Tomasa Hose is HCPOA followed by DIL Ulice Dash. Scanned 07/2021. Ok with CPR. Would not want life support if recovery futile. Unsure about feeding tube.       Other Visit Diagnoses     Subacute cough       Relevant Orders   DG Chest 2 View        Meds ordered this encounter  Medications  . buPROPion (WELLBUTRIN XL) 150 MG 24 hr tablet    Sig: Take 1 tablet (150 mg total) by mouth daily.    Dispense:  90 tablet    Refill:  4  . omeprazole (PRILOSEC) 20 MG capsule    Sig: Take 1 capsule (20 mg total) by mouth daily.    Dispense:  90 capsule    Refill:  4  . losartan (COZAAR) 25 MG tablet    Sig: Take 1 tablet (25 mg total) by mouth daily.    Dispense:  30 tablet    Refill:  6    Orders Placed This Encounter  Procedures  . DG Chest 2 View    Standing Status:   Future    Number of Occurrences:   1    Standing Expiration Date:   11/22/2023    Order Specific Question:   Reason for Exam (SYMPTOM  OR DIAGNOSIS REQUIRED)    Answer:   cough x 1 month, increasing dyspnea and wheezing    Order Specific Question:   Preferred imaging location?    Answer:   [facility]    Patient Instructions  Consider melatonin 5mg  nightly for sleep.  Wellbutrin and omeprazole refilled - continue omeprazole 20mg  daily  Chest xray today.  Blood pressure was too high - start monitoring blood pressure at home and drop off BP log in 2 weeks to review. Losartan 25mg  prescription printed out today - start  taking in addition to other medicines if BP consistently >150/90 at home.  Return as needed or in 2 months for blood pressure follow up visit.  Good to see you today   Follow up plan: Return in about 2 months (around 01/22/2023), or if symptoms worsen or fail to improve, for follow up visit.  Eustaquio Boyden, MD

## 2022-11-22 NOTE — Patient Instructions (Addendum)
Consider melatonin 5mg  nightly for sleep.  Wellbutrin and omeprazole refilled - continue omeprazole 20mg  daily  Chest xray today.  Blood pressure was too high - start monitoring blood pressure at home and drop off BP log in 2 weeks to review. Losartan 25mg  prescription printed out today - start  taking in addition to other medicines if BP consistently >150/90 at home.  Return as needed or in 2 months for blood pressure follow up visit.  Good to see you today

## 2022-11-23 ENCOUNTER — Ambulatory Visit: Payer: Medicare Other | Attending: Cardiovascular Disease

## 2022-11-23 ENCOUNTER — Encounter: Payer: Self-pay | Admitting: Family Medicine

## 2022-11-23 DIAGNOSIS — G4700 Insomnia, unspecified: Secondary | ICD-10-CM | POA: Insufficient documentation

## 2022-11-23 DIAGNOSIS — Z5181 Encounter for therapeutic drug level monitoring: Secondary | ICD-10-CM | POA: Diagnosis not present

## 2022-11-23 DIAGNOSIS — R052 Subacute cough: Secondary | ICD-10-CM | POA: Insufficient documentation

## 2022-11-23 DIAGNOSIS — I4819 Other persistent atrial fibrillation: Secondary | ICD-10-CM | POA: Insufficient documentation

## 2022-11-23 DIAGNOSIS — K219 Gastro-esophageal reflux disease without esophagitis: Secondary | ICD-10-CM | POA: Insufficient documentation

## 2022-11-23 LAB — POCT INR: INR: 3.5 — AB (ref 2.0–3.0)

## 2022-11-23 NOTE — Assessment & Plan Note (Signed)
Latest GFR was 67.  Will continue to monitor this.

## 2022-11-23 NOTE — Assessment & Plan Note (Signed)
Chronic, stable period on Wellbutrin-continue.

## 2022-11-23 NOTE — Assessment & Plan Note (Signed)
Chronic, stable period on Coumadin through cardiology Coumadin clinic and metoprolol.

## 2022-11-23 NOTE — Assessment & Plan Note (Signed)
GI note reviewed-they suggested EGD to screen for Barrett's esophagus, patient declined.  He has been able to taper down on omeprazole dose to 20 mg daily with good effect-will continue this.

## 2022-11-23 NOTE — Assessment & Plan Note (Signed)
Continues lifelong anticoagulation through cardiology Coumadin clinic.

## 2022-11-23 NOTE — Assessment & Plan Note (Signed)
Continue atorvastatin and Zetia. 

## 2022-11-23 NOTE — Assessment & Plan Note (Signed)
Notes ongoing difficulty with sleep. He has been able to stop his Xanax XR. Suggested a trial of melatonin 5 to 10 mg at nighttime.

## 2022-11-23 NOTE — Assessment & Plan Note (Signed)
Latest ultrasound 08/2021 stable dilation of abdominal aorta up to 3.2 cm-recommend repeat ultrasound in 3 years

## 2022-11-23 NOTE — Assessment & Plan Note (Signed)
Chronic issue, longstanding treated with Xanax XR 0.5 mg daily-he has been able to stop benzo over the past 2 months and notes he is doing well off of this medication.  Will stay off at this time.

## 2022-11-23 NOTE — Assessment & Plan Note (Signed)
Chronic, deteriorated with BP elevation noted today.  This is despite clonidine 0.1 twice daily and metoprolol 25 twice daily.  He has not been checking at home. I provided him with blood pressure log to monitor at home, and printed out a wait-and-see prescription for losartan 25 mg daily to start taking if BP consistently high at home. I asked him to return in 2 months for blood pressure follow-up visit.

## 2022-11-23 NOTE — Assessment & Plan Note (Signed)
Notes malaise, respiratory symptoms for 4 weeks including chest congestion with dry cough wheezing and intermittent shortness of breath.  Lung exam clear, normal vital signs without fever.  No wheezing heard on exam, no history of asthma.  No known COPD but he is a neck smoker with 45 pack years, quitting in 1990s.  Question COPD flare-discussed option of prednisone burst but he declines at this time.  Will update chest x-ray today-clear on my read.

## 2022-11-23 NOTE — Patient Instructions (Signed)
Hold today only then Continue Warfarin 1 tablet daily except 1.5 tablets on Fridays.  Recheck in 6 weeks.  [phone]

## 2022-11-23 NOTE — Assessment & Plan Note (Signed)
Chronic, stable period on zetia and atorvastatin - continue. The ASCVD Risk score (Arnett DK, et al., 2019) failed to calculate for the following reasons:   The valid total cholesterol range is 130 to 320 mg/dL

## 2022-11-23 NOTE — Assessment & Plan Note (Signed)
Continue to encourage healthy diet and lifestyle choices to affect sustainable weight loss.  °

## 2022-12-21 ENCOUNTER — Other Ambulatory Visit: Payer: Self-pay | Admitting: Cardiovascular Disease

## 2022-12-21 DIAGNOSIS — I4819 Other persistent atrial fibrillation: Secondary | ICD-10-CM

## 2022-12-21 NOTE — Telephone Encounter (Signed)
Refill request

## 2022-12-21 NOTE — Telephone Encounter (Signed)
Refill request for warfarin:  Last INR was 3.5 on 11/23/22 Next INR due 01/04/23 LOV was 05/24/22  Refill approved.

## 2022-12-28 DIAGNOSIS — H903 Sensorineural hearing loss, bilateral: Secondary | ICD-10-CM | POA: Diagnosis not present

## 2022-12-28 DIAGNOSIS — H6123 Impacted cerumen, bilateral: Secondary | ICD-10-CM | POA: Diagnosis not present

## 2022-12-30 ENCOUNTER — Telehealth: Payer: Self-pay | Admitting: Family Medicine

## 2022-12-30 NOTE — Telephone Encounter (Signed)
 Placed log in Dr. Timoteo Expose box.

## 2022-12-30 NOTE — Telephone Encounter (Signed)
Patient dropped off Blood Pressure Log at front desk

## 2023-01-02 NOTE — Addendum Note (Signed)
Addended by: Eustaquio Boyden on: 01/02/2023 05:01 PM   Modules accepted: Orders

## 2023-01-02 NOTE — Telephone Encounter (Addendum)
Would suggest he drop clonidine to 0.1mg  once daily in the morning and touch base with cardiology about this at his upcoming appt this week.  Update [place] with how he feels with this. If ongoing trouble, rec OV as I did want to see him in 2 months for HTN f/u visit.

## 2023-01-02 NOTE — Telephone Encounter (Addendum)
BP log sheet reviewed - overall stable BP readings especially since starting losartan 25mg  daily. He also continues clonidine 0.1mg  bid and metoprolol 25mg  bid. Averaging ~130s/80s.   He has he felt with 3rd BP med losartan on board? Ensure no low BP symptoms of dizziness, lightheadedness. Would continue current regimen.

## 2023-01-02 NOTE — Telephone Encounter (Signed)
Patient states that he was feeling weak last night and check bp it was 118/64 and dec 8 106/60. Both times it was in the evening. Patient states that weakness improved. Patient also states dec 11 119/72. In the morning. Patient is currently taking Losartan 25mg , clonidine 0.1mg  and metoprolol 25mg  in the am and clonidine 0.1mg  bid and metoprolol 25mg  in the evening waned to know if you thought he should cut one of the medications in half. Advised patient not to make any changes at this time will send information to Dr. Sharen Hones for review and we will reach out with any recommendations.

## 2023-01-03 NOTE — Telephone Encounter (Addendum)
Spoke with pt relaying Dr Timoteo Expose message. Pt verbalizes understanding. States he has not had an "episode" lately. Scheduled 2 mo HTN f/u on 01/23/22 at 10:30 and will keep a log of BP readings.

## 2023-01-04 ENCOUNTER — Ambulatory Visit: Payer: Medicare Other | Attending: Cardiovascular Disease

## 2023-01-04 DIAGNOSIS — Z5181 Encounter for therapeutic drug level monitoring: Secondary | ICD-10-CM | POA: Insufficient documentation

## 2023-01-04 DIAGNOSIS — I4819 Other persistent atrial fibrillation: Secondary | ICD-10-CM | POA: Diagnosis not present

## 2023-01-04 LAB — POCT INR: INR: 2.3 (ref 2.0–3.0)

## 2023-01-04 NOTE — Patient Instructions (Signed)
Continue Warfarin 1 tablet daily except 1.5 tablets on Fridays.  Recheck in 6 weeks.  [phone]

## 2023-01-24 ENCOUNTER — Encounter: Payer: Self-pay | Admitting: Family Medicine

## 2023-01-24 ENCOUNTER — Ambulatory Visit (INDEPENDENT_AMBULATORY_CARE_PROVIDER_SITE_OTHER): Payer: Medicare Other | Admitting: Family Medicine

## 2023-01-24 VITALS — BP 134/84 | HR 83 | Temp 97.7°F | Ht 69.5 in | Wt 228.4 lb

## 2023-01-24 DIAGNOSIS — I4819 Other persistent atrial fibrillation: Secondary | ICD-10-CM | POA: Diagnosis not present

## 2023-01-24 DIAGNOSIS — I10 Essential (primary) hypertension: Secondary | ICD-10-CM

## 2023-01-24 LAB — BASIC METABOLIC PANEL
BUN: 24 mg/dL — ABNORMAL HIGH (ref 6–23)
CO2: 29 meq/L (ref 19–32)
Calcium: 9.4 mg/dL (ref 8.4–10.5)
Chloride: 106 meq/L (ref 96–112)
Creatinine, Ser: 1.17 mg/dL (ref 0.40–1.50)
GFR: 59.69 mL/min — ABNORMAL LOW (ref >60.00)
Glucose, Bld: 102 mg/dL — ABNORMAL HIGH (ref 70–99)
Potassium: 4.6 meq/L (ref 3.5–5.1)
Sodium: 142 meq/L (ref 135–145)

## 2023-01-24 LAB — TSH: TSH: 2.32 u[IU]/mL (ref 0.35–5.50)

## 2023-01-24 MED ORDER — LOSARTAN POTASSIUM 25 MG PO TABS: 25.0000 mg | ORAL_TABLET | Freq: Every day | ORAL | 2 refills | Status: DC

## 2023-01-24 NOTE — Assessment & Plan Note (Signed)
 Chronic, stable on Coumadin plus metoprolol.   Coumadin is managed by cardiology Coumadin clinic.

## 2023-01-24 NOTE — Progress Notes (Signed)
 Ph: [phone] Fax: [phone]   Patient ID: Anthony Anthony Kim., male    DOB: Jul 30, 1945, 78 y.o.   MRN: [id number]  This visit was conducted in person.  BP 134/84   Pulse 83   Temp 97.7 F (36.5 C) (Oral)   Ht 5' 9.5 (1.765 m)   Wt 228 lb 6 oz (103.6 kg)   SpO2 97%   BMI 33.24 kg/m    CC: HTN f/u visit  Subjective:   HPI: Anthony Kim. is a 78 y.o. male presenting on 01/24/2023 for Medical Management of Chronic Issues (Here for 2 mo HTN f/u.)   HTN - Compliant with current antihypertensive regimen of clonidine  0.1mg  daily, metoprolol  25mg  bid, losartan  25mg  daily. Does check blood pressures at home: see below for BP log. Nadir Sun Jan 5th 90/64, improved to 110/70s. Some low BP readings but overall well controlled.  No low blood pressure readings or symptoms of dizziness/syncope.  Denies HA, vision changes, CP/tightness, SOB, leg swelling.    Episodes of hypotension associated with weakness with BP 108/60, we dropped clonidine  to 0.1mg  once daily (from BID dosing).   Known afib managed with coumadin  and metoprolol .   Drinks 1 cup decaf coffee. 1 glass of grape juice as well, 1 glass of milk. Some water intermittently.        Relevant past medical, surgical, family and social history reviewed and updated as indicated. Interim medical history since our last visit reviewed. Allergies and medications reviewed and updated. Outpatient Medications Prior to Visit  Medication Sig Dispense Refill   acetaminophen  (TYLENOL ) 650 MG CR tablet Take 650 mg by mouth 2 (two) times daily.     ALLERGY RELIEF 180 MG tablet TAKE ONE TABLET DAILY FOR CONGESTION. 90 tablet 0   atorvastatin  (LIPITOR) 40 MG tablet Take 1 tablet (40 mg total) by mouth daily at 6 PM. 90 tablet 3   buPROPion  (WELLBUTRIN  XL) 150 MG 24 hr tablet Take 1 tablet (150 mg total) by mouth daily. 90 tablet 4   ezetimibe  (ZETIA ) 10 MG tablet Take 1 tablet (10 mg total) by mouth daily. 90 tablet 3   meclizine   (ANTIVERT ) 25 MG tablet Take 25 mg by mouth 3 (three) times daily as needed for dizziness. Reported on 07/01/2015     metoprolol  tartrate (LOPRESSOR ) 25 MG tablet Take 1 tablet (25 mg total) by mouth 2 (two) times daily. 180 tablet 3   Multiple Vitamin (MULTIVITAMIN PO) Take 1 tablet by mouth daily. pm     omeprazole  (PRILOSEC) 20 MG capsule Take 1 capsule (20 mg total) by mouth daily. 90 capsule 4   warfarin (COUMADIN ) 5 MG tablet Take 1 to 1 1/2 tablets by mouth daily or as directed by the coumadin  clinic. 40 tablet 5   cloNIDine  (CATAPRES ) 0.1 MG tablet Take 1 tablet (0.1 mg total) by mouth daily.     losartan  (COZAAR ) 25 MG tablet Take 1 tablet (25 mg total) by mouth daily. 30 tablet 6   cloNIDine  (CATAPRES ) 0.1 MG tablet Take 0.5 tablets (0.05 mg total) by mouth daily.     No facility-administered medications prior to visit.     Per HPI unless specifically indicated in ROS section below Review of Systems  Objective:  BP 134/84   Pulse 83   Temp 97.7 F (36.5 C) (Oral)   Ht 5' 9.5 (1.765 m)   Wt 228 lb 6 oz (103.6 kg)   SpO2 97%   BMI 33.24 kg/m  Wt Readings from Last 3 Encounters:  01/24/23 228 lb 6 oz (103.6 kg)  11/22/22 228 lb (103.4 kg)  05/24/22 232 lb 6 oz (105.4 kg)      Physical Exam Vitals and nursing note reviewed.  Constitutional:      Appearance: Normal appearance. He is not ill-appearing.  HENT:     Mouth/Throat:     Mouth: Mucous membranes are moist.     Pharynx: Oropharynx is clear. No oropharyngeal exudate or posterior oropharyngeal erythema.  Eyes:     Extraocular Movements: Extraocular movements intact.     Pupils: Pupils are equal, round, and reactive to light.  Neck:     Thyroid : No thyroid  mass or thyromegaly.  Cardiovascular:     Rate and Rhythm: Normal rate. Rhythm irregularly irregular.     Pulses: Normal pulses.     Heart sounds: Normal heart sounds. No murmur heard. Pulmonary:     Effort: Pulmonary effort is normal. No respiratory  distress.     Breath sounds: Normal breath sounds. No stridor. No wheezing or rales.  Musculoskeletal:     Cervical back: Normal range of motion and neck supple.     Right lower leg: No edema.     Left lower leg: No edema.  Skin:    General: Skin is warm and dry.     Findings: No rash.  Neurological:     Mental Status: He is alert.  Psychiatric:        Mood and Affect: Mood normal.        Behavior: Behavior normal.       Results for orders placed or performed in visit on 01/04/23  POCT INR   Collection Time: 01/04/23 10:06 AM  Result Value Ref Range   INR 2.3 2.0 - 3.0   POC INR     Lab Results  Component Value Date   TSH 2.95 03/11/2020    Assessment & Plan:   Problem List Items Addressed This Visit     Essential hypertension - Primary   Chronic, improving, with some readings too low - will continue clonidine  titration to half tablet of 0.1 mg daily in the morning (0.05 mg daily).  If ongoing low blood pressures in the mornings, low threshold to completely stop clonidine . Continue losartan  25 mg daily and metoprolol  25 mg twice daily. Update BMP with new losartan  commencement. Also encouraged increased water intake to avoid dehydration contribution.      Relevant Medications   cloNIDine  (CATAPRES ) 0.1 MG tablet   losartan  (COZAAR ) 25 MG tablet   Other Relevant Orders   Basic Metabolic Panel   Persistent atrial fibrillation (HCC)   Chronic, stable on Coumadin  plus metoprolol .   Coumadin  is managed by cardiology Coumadin  clinic.      Relevant Medications   cloNIDine  (CATAPRES ) 0.1 MG tablet   losartan  (COZAAR ) 25 MG tablet   Other Relevant Orders   Basic Metabolic Panel   TSH     Meds ordered this encounter  Medications   losartan  (COZAAR ) 25 MG tablet    Sig: Take 1 tablet (25 mg total) by mouth daily.    Dispense:  90 tablet    Refill:  2    Orders Placed This Encounter  Procedures   Basic Metabolic Panel   TSH    Patient Instructions  Increase  water intake by one to two 8 oz glasse/day.  Cut down on clonidine  to 1/2 tablet in the morning. If ongoing low blood pressures, try off clonidine .  Labs today  Good to see you today.   Follow up plan: No follow-ups on file.  Anton Blas, MD

## 2023-01-24 NOTE — Patient Instructions (Addendum)
 Increase water intake by one to two 8 oz glasse/day.  Cut down on clonidine to 1/2 tablet in the morning. If ongoing low blood pressures, try off clonidine.  Labs today  Good to see you today.

## 2023-01-24 NOTE — Assessment & Plan Note (Addendum)
 Chronic, improving, with some readings too low - will continue clonidine  titration to half tablet of 0.1 mg daily in the morning (0.05 mg daily).  If ongoing low blood pressures in the mornings, low threshold to completely stop clonidine . Continue losartan  25 mg daily and metoprolol  25 mg twice daily. Update BMP with new losartan  commencement. Also encouraged increased water intake to avoid dehydration contribution.

## 2023-01-26 ENCOUNTER — Encounter: Payer: Self-pay | Admitting: Family Medicine

## 2023-02-13 ENCOUNTER — Other Ambulatory Visit: Payer: Self-pay | Admitting: Family Medicine

## 2023-02-13 DIAGNOSIS — J309 Allergic rhinitis, unspecified: Secondary | ICD-10-CM

## 2023-02-15 ENCOUNTER — Ambulatory Visit: Payer: Medicare Other | Attending: Cardiovascular Disease

## 2023-02-15 DIAGNOSIS — Z5181 Encounter for therapeutic drug level monitoring: Secondary | ICD-10-CM | POA: Insufficient documentation

## 2023-02-15 DIAGNOSIS — I4819 Other persistent atrial fibrillation: Secondary | ICD-10-CM | POA: Insufficient documentation

## 2023-02-15 LAB — POCT INR: INR: 2.5 (ref 2.0–3.0)

## 2023-02-15 NOTE — Patient Instructions (Signed)
Continue Warfarin 1 tablet daily except 1.5 tablets on Fridays.  Recheck in 6 weeks.  [phone]

## 2023-03-28 ENCOUNTER — Ambulatory Visit (INDEPENDENT_AMBULATORY_CARE_PROVIDER_SITE_OTHER): Payer: Medicare Other

## 2023-03-28 VITALS — Ht 69.5 in | Wt 228.0 lb

## 2023-03-28 DIAGNOSIS — Z0000 Encounter for general adult medical examination without abnormal findings: Secondary | ICD-10-CM | POA: Diagnosis not present

## 2023-03-28 NOTE — Patient Instructions (Signed)
 Anthony Kim , Thank you for taking time to come for your Medicare Wellness Visit. I appreciate your ongoing commitment to your health goals. Please review the following plan we discussed and let me know if I can assist you in the future.   Referrals/Orders/Follow-Ups/Clinician Recommendations: none  This is a list of the screening recommended for you and due dates:  Health Maintenance  Topic Date Due   DTaP/Tdap/Td vaccine (3 - Tdap) 09/17/2018   COVID-19 Vaccine (4 - 2024-25 season) 09/18/2022   Medicare Annual Wellness Visit  03/27/2024   Pneumonia Vaccine  Completed   Flu Shot  Completed   Hepatitis C Screening  Completed   Zoster (Shingles) Vaccine  Completed   HPV Vaccine  Aged Out   Colon Cancer Screening  Discontinued    Advanced directives: (In Chart) A copy of your advanced directives are scanned into your chart should your provider ever need it.  Next Medicare Annual Wellness Visit scheduled for next year: Yes 03/28/2024 @ 3:40pm televisit

## 2023-03-28 NOTE — Progress Notes (Signed)
 Subjective:   Anthony Reppert. is a 78 y.o. who presents for a Medicare Wellness preventive visit.  Visit Complete: Virtual I connected with  Anthony Kim. on 03/28/23 by a audio enabled telemedicine application and verified that I am speaking with the correct person using two identifiers.  Patient Location: Home  Provider Location: Home Office  I discussed the limitations of evaluation and management by telemedicine. The patient expressed understanding and agreed to proceed.  Vital Signs: Because this visit was a virtual/telehealth visit, some criteria may be missing or patient reported. Any vitals not documented were not able to be obtained and vitals that have been documented are patient reported.  VideoDeclined- This patient declined Librarian, academic. Therefore the visit was completed with audio only.  AWV Questionnaire: No: Patient Medicare AWV questionnaire was not completed prior to this visit.  Cardiac Risk Factors include: advanced age (>13men, >47 women);obesity (BMI >30kg/m2);male gender;hypertension;dyslipidemia     Objective:    Today's Vitals   03/28/23 1537  Weight: 228 lb (103.4 kg)  Height: 5' 9.5" (1.765 m)   Body mass index is 33.19 kg/m.     03/28/2023    3:53 PM 03/23/2022   11:55 AM 03/10/2021    2:01 PM 03/05/2020   11:47 AM 09/16/2018   12:02 PM 07/03/2018   12:11 PM 06/22/2017   10:21 AM  Advanced Directives  Does Patient Have a Medical Advance Directive? Yes Yes Yes Yes No Yes Yes  Type of Estate agent of [place];Living will Healthcare Power of [place];Living will Healthcare Power of [place];Living will Healthcare Power of [place];Living will  Healthcare Power of [place];Living will Healthcare Power of [place];Living will  Does patient want to make changes to medical advance directive?  No - Patient declined Yes (MAU/Ambulatory/Procedural Areas - Information given)   No - Patient declined    Copy of Healthcare Power of Attorney in Chart? Yes - validated most recent copy scanned in chart (See row information) Yes - validated most recent copy scanned in chart (See row information)  No - copy requested  Yes - validated most recent copy scanned in chart (See row information) No - copy requested  Would patient like information on creating a medical advance directive?     No - Patient declined      Current Medications (verified) Outpatient Encounter Medications as of 03/28/2023  Medication Sig   acetaminophen (TYLENOL) 650 MG CR tablet Take 650 mg by mouth 2 (two) times daily.   atorvastatin (LIPITOR) 40 MG tablet Take 1 tablet (40 mg total) by mouth daily at 6 PM.   buPROPion (WELLBUTRIN XL) 150 MG 24 hr tablet Take 1 tablet (150 mg total) by mouth daily.   cloNIDine (CATAPRES) 0.1 MG tablet Take 0.5 tablets (0.05 mg total) by mouth daily.   ezetimibe (ZETIA) 10 MG tablet Take 1 tablet (10 mg total) by mouth daily.   fexofenadine (ALLERGY RELIEF) 180 MG tablet TAKE ONE TABLET DAILY FOR CONGESTION.   losartan (COZAAR) 25 MG tablet Take 1 tablet (25 mg total) by mouth daily.   meclizine (ANTIVERT) 25 MG tablet Take 25 mg by mouth 3 (three) times daily as needed for dizziness. Reported on 07/01/2015   metoprolol tartrate (LOPRESSOR) 25 MG tablet Take 1 tablet (25 mg total) by mouth 2 (two) times daily.   Multiple Vitamin (MULTIVITAMIN PO) Take 1 tablet by mouth daily. pm   omeprazole (PRILOSEC) 20 MG capsule Take 1 capsule (20 mg total)  by mouth daily.   warfarin (COUMADIN) 5 MG tablet Take 1 to 1 1/2 tablets by mouth daily or as directed by the coumadin clinic.   No facility-administered encounter medications on file as of 03/28/2023.   Allergies (verified) Iodine   History: Past Medical History:  Diagnosis Date   AAA (abdominal aortic aneurysm) (HCC) 2021   Anxiety    CAD (coronary artery disease)    a. 07/2012 CTA chest - incidental coronary Ca2+ of LAD/LCX; b. 08/2012 Myoview:  EF 59%, non ischemia/scar-->Low risk.   Cellulitis of left leg 02/19/2021   Clotting disorder (HCC)    Colon polyps    2008 Tubular Adenoma   Cough    chronic   Deep vein thrombosis (DVT) (HCC) 04/06/2010   Depression    DVT (deep venous thrombosis) (HCC)    a. RLE DVT ~ 05/2010-->chronic coumadin.   GERD (gastroesophageal reflux disease)    Hyperlipidemia    Hypertension    controlled on meds   IBS (irritable bowel syndrome)    Lobar pneumonia (HCC) 10/06/2021   NICM (nonischemic cardiomyopathy) (HCC)    a. 06/2016 Echo: EF 45-50%, diff HK, mod dil LA/RA.   Palpitations    Persistent atrial fibrillation (HCC)    a. Dx 06/2010 - CHA2DS2VASc = 3-4 (Chronic coumadin); b. 07/2016 s/p DCCV (150J) - initially successful but reverted to Afib.   Pneumonia    3 wks ago/ no hospitalization/ no follow up required/ was on antibiotic and steroids/Dr Dallas Schimke   Presence of partial dental prosthetic device    lower right   Seasonal allergies    Past Surgical History:  Procedure Laterality Date   APPENDECTOMY     1967   CARDIOVERSION N/A 08/03/2016   Procedure: CARDIOVERSION;  Surgeon: Antonieta Iba, MD;  Location: [facility] ORS;  Service: Cardiovascular;  Laterality: N/A;   CATARACT EXTRACTION W/PHACO Right 07/08/2015   Procedure: CATARACT EXTRACTION PHACO AND INTRAOCULAR LENS PLACEMENT (IOC) RIGHT EYE;  Surgeon: Lockie Mola, MD;  Location: [facility];  Service: Ophthalmology;  Laterality: Right;   SYMFONY LENS   CATARACT EXTRACTION W/PHACO Left 08/05/2015   Procedure: CATARACT EXTRACTION PHACO AND INTRAOCULAR LENS PLACEMENT (IOC) left eye;  Surgeon: Lockie Mola, MD;  Location: [facility];  Service: Ophthalmology;  Laterality: Left;  SYMFONY LENS   CATARACT EXTRACTION, BILATERAL Bilateral 08/2015   COLONOSCOPY     x3   POLYPECTOMY     TONSILLECTOMY AND ADENOIDECTOMY     Family History  Problem Relation Age of Onset   Heart disease Mother    Hypertension  Mother    Irritable bowel syndrome Mother    Heart attack Father    Hypertension Father    Prostate cancer Father    Colon cancer Neg Hx    Stomach cancer Neg Hx    Esophageal cancer Neg Hx    Pancreatitis Neg Hx    Social History   Socioeconomic History   Marital status: Widowed    Spouse name: Not on file   Number of children: 1   Years of education: Not on file   Highest education level: Not on file  Occupational History   Occupation: Investment banker, corporate: [employer]  Tobacco Use   Smoking status: Former    Current packs/day: 0.00    Average packs/day: 1 pack/day for 30.0 years (30.0 ttl pk-yrs)    Types: Cigarettes    Start date: 01/17/1962    Quit date: 01/18/1992    Years since  quitting: 31.2    Passive exposure: Never   Smokeless tobacco: Never  Vaping Use   Vaping status: Never Used  Substance and Sexual Activity   Alcohol use: Yes    Alcohol/week: 12.0 standard drinks of alcohol    Types: 12 Glasses of wine per week    Comment: 1-2 glasses of wine 3-4 days a weeks   Drug use: No   Sexual activity: Yes  Other Topics Concern   Not on file  Social History Narrative   Has living will.   Desires CPR.   Would not want life support if recovery futile.  Unsure about feeding tube.   Lives with wife [name]   Activity: active on cattle farm   Diet: good water, G2 gatorade, fruits/vegetables daily    Social Drivers of Health   Financial Resource Strain: Low Risk  (03/28/2023)   Overall Financial Resource Strain (CARDIA)    Difficulty of Paying Living Expenses: Not hard at all  Food Insecurity: No Food Insecurity (03/28/2023)   Hunger Vital Sign    Worried About Running Out of Food in the Last Year: Never true    Ran Out of Food in the Last Year: Never true  Transportation Needs: No Transportation Needs (03/28/2023)   PRAPARE - Administrator, Civil Service (Medical): No    Lack of Transportation (Non-Medical): No  Physical Activity: Inactive  (03/28/2023)   Exercise Vital Sign    Days of Exercise per Week: 0 days    Minutes of Exercise per Session: 0 min  Stress: No Stress Concern Present (03/28/2023)   [employer] of Occupational Health - Occupational Stress Questionnaire    Feeling of Stress : Not at all  Social Connections: Socially Isolated (03/28/2023)   Social Connection and Isolation Panel [NHANES]    Frequency of Communication with Friends and Family: More than three times a week    Frequency of Social Gatherings with Friends and Family: Twice a week    Attends Religious Services: Never    Database administrator or Organizations: No    Attends Banker Meetings: Never    Marital Status: Widowed    Tobacco Counseling Counseling given: Not Answered  Clinical Intake:  Pre-visit preparation completed: Yes  Pain : No/denies pain    BMI - recorded: 33.19 Nutritional Status: BMI > 30  Obese Nutritional Risks: None Diabetes: No  How often do you need to have someone help you when you read instructions, pamphlets, or other written materials from your doctor or pharmacy?: 1 - Never  Interpreter Needed?: No  Comments: lives alone Information entered by :: B.Chet Greenley,LPN   Activities of Daily Living     03/28/2023    3:53 PM  In your present state of health, do you have any difficulty performing the following activities:  Hearing? 0  Vision? 1  Difficulty concentrating or making decisions? 0  Walking or climbing stairs? 1  Dressing or bathing? 0  Doing errands, shopping? 1  Preparing Food and eating ? N  Using the Toilet? N  In the past six months, have you accidently leaked urine? N  Do you have problems with loss of bowel control? N  Managing your Medications? N  Managing your Finances? N  Housekeeping or managing your Housekeeping? N    Patient Care Team: Eustaquio Boyden, MD as PCP - General (Family Medicine) Mariah Milling Tollie Pizza, MD as PCP - Cardiology (Cardiology) Antonieta Iba, MD as Consulting Physician (Cardiology) Arlyce Dice,  Barbette Hair, MD (Inactive) as Consulting Physician (Gastroenterology) McDiarmid, Leighton Roach, MD as Consulting Physician (Family Medicine) Alfredo Martinez, MD as Consulting Physician (Urology) Lemar Lofty, MD as Consulting Physician (Ophthalmology) Ollen Gross, MD as Consulting Physician (Orthopedic Surgery) Lockie Mola, MD as Referring Physician (Ophthalmology) Kathyrn Sheriff, [facility] (Inactive) as Pharmacist (Pharmacist)  Indicate any recent Medical Services you may have received from other than [facility] providers in the past year (date may be approximate).     Assessment:   This is a routine wellness examination for [place].  Hearing/Vision screen Hearing Screening - Comments:: Pt says his hearing is ok Vision Screening - Comments:: Pt says his vision has worsened;glasses not helpful [facility]   Goals Addressed             This Visit's Progress    Patient Stated   Not on track    03/28/23-Would like to start walking      Patient Stated   Not on track    03/28/23-Stay healthy.       Depression Screen     03/28/2023    3:49 PM 01/24/2023   10:52 AM 03/23/2022   11:53 AM 03/10/2021    2:06 PM 03/05/2020   11:48 AM 07/03/2018   12:06 PM 06/22/2017   10:15 AM  PHQ 2/9 Scores  PHQ - 2 Score 0 1 0 0 2 0 0  PHQ- 9 Score  4   2 0 0    Fall Risk     03/28/2023    3:41 PM 01/24/2023   10:51 AM 03/23/2022   11:47 AM 03/10/2021    2:02 PM 03/05/2020   11:48 AM  Fall Risk   Falls in the past year? 0 0 0 0 0  Number falls in past yr: 1  0 0 0  Injury with Fall? 0  0 0 0  Risk for fall due to : No Fall Risks  No Fall Risks No Fall Risks Medication side effect  Follow up Education provided;Falls prevention discussed  Falls prevention discussed;Falls evaluation completed Falls prevention discussed Falls evaluation completed;Falls prevention discussed    MEDICARE RISK AT HOME:  Medicare Risk at Home Any stairs in  or around the home?: Yes If so, are there any without handrails?: Yes Home free of loose throw rugs in walkways, pet beds, electrical cords, etc?: Yes Adequate lighting in your home to reduce risk of falls?: Yes Life alert?: No Use of a cane, walker or w/c?: No Grab bars in the bathroom?: Yes Shower chair or bench in shower?: No Elevated toilet seat or a handicapped toilet?: Yes  TIMED UP AND GO:  Was the test performed?  No  Cognitive Function: 6CIT completed    03/05/2020   11:50 AM 07/03/2018   12:10 PM 06/22/2017   10:20 AM 09/29/2015   11:50 AM  MMSE - Mini Mental State Exam  Orientation to time 5 5 5 5   Orientation to Place 5 5 5 5   Registration 3 3 3 3   Attention/ Calculation 5 0 0 0  Recall 3 3 3 3   Language- name 2 objects  0 0 0  Language- repeat 1 1 1 1   Language- follow 3 step command  0 3 3  Language- read & follow direction  0 0 0  Write a sentence  0 0 0  Copy design  0 0 0  Total score  17 20 20         03/28/2023    3:58 PM  03/23/2022   11:56 AM  6CIT Screen  What Year? 0 points 0 points  What month? 0 points 0 points  What time? 0 points 0 points  Count back from 20 0 points 0 points  Months in reverse 0 points 0 points  Repeat phrase 2 points 4 points  Total Score 2 points 4 points    Immunizations Immunization History  Administered Date(s) Administered   Fluad Quad(high Dose 65+) 10/18/2018, 10/30/2019, 10/18/2021, 10/14/2022   Influenza Split 10/31/2011   Influenza Whole 10/30/2007, 10/28/2008, 10/27/2009   Influenza,inj,Quad PF,6+ Mos 11/13/2013, 10/14/2014, 10/01/2015, 01/12/2017, 11/09/2017   Influenza-Unspecified 09/17/2020   Moderna Sars-Covid-2 Vaccination 03/06/2019, 04/03/2019, 11/21/2019   Pneumococcal Conjugate-13 11/13/2013   Pneumococcal Polysaccharide-23 03/23/2011   Td 08/03/2000, 09/16/2008   Zoster Recombinant(Shingrix) 08/06/2021, 11/23/2021   Zoster, Live 10/28/2008    Screening Tests Health Maintenance  Topic Date Due    DTaP/Tdap/Td (3 - Tdap) 09/17/2018   COVID-19 Vaccine (4 - 2024-25 season) 09/18/2022   Medicare Annual Wellness (AWV)  03/27/2024   Pneumonia Vaccine 31+ Years old  Completed   INFLUENZA VACCINE  Completed   Hepatitis C Screening  Completed   Zoster Vaccines- Shingrix  Completed   HPV VACCINES  Aged Out   Colonoscopy  Discontinued    Health Maintenance  Health Maintenance Due  Topic Date Due   DTaP/Tdap/Td (3 - Tdap) 09/17/2018   COVID-19 Vaccine (4 - 2024-25 season) 09/18/2022   Health Maintenance Items Addressed: None needed  Additional Screening:  Vision Screening: Recommended annual ophthalmology exams for early detection of glaucoma and other disorders of the eye.  Dental Screening: Recommended annual dental exams for proper oral hygiene  Community Resource Referral / Chronic Care Management: CRR required this visit?  No   CCM required this visit?  No    Plan:     I have personally reviewed and noted the following in the patient's chart:   Medical and social history Use of alcohol, tobacco or illicit drugs  Current medications and supplements including opioid prescriptions. Patient is not currently taking opioid prescriptions. Functional ability and status Nutritional status Physical activity Advanced directives List of other physicians Hospitalizations, surgeries, and ER visits in previous 12 months Vitals Screenings to include cognitive, depression, and falls Referrals and appointments  In addition, I have reviewed and discussed with patient certain preventive protocols, quality metrics, and best practice recommendations. A written personalized care plan for preventive services as well as general preventive health recommendations were provided to patient.    Sue Lush, LPN   8/75/6433   After Visit Summary: (MyChart) Due to this being a telephonic visit, the after visit summary with patients personalized plan was offered to patient via  MyChart   Notes: Nothing significant to report at this time.

## 2023-03-29 ENCOUNTER — Telehealth: Payer: Self-pay | Admitting: Cardiovascular Disease

## 2023-03-29 ENCOUNTER — Ambulatory Visit: Payer: Medicare Other | Attending: Cardiovascular Disease

## 2023-03-29 DIAGNOSIS — I4819 Other persistent atrial fibrillation: Secondary | ICD-10-CM | POA: Diagnosis not present

## 2023-03-29 DIAGNOSIS — Z5181 Encounter for therapeutic drug level monitoring: Secondary | ICD-10-CM | POA: Diagnosis not present

## 2023-03-29 LAB — POCT INR: INR: 2.4 (ref 2.0–3.0)

## 2023-03-29 NOTE — Telephone Encounter (Signed)
 Left a message for the patient to call back.

## 2023-03-29 NOTE — Patient Instructions (Signed)
 Continue Warfarin 1 tablet daily except 1.5 tablets on Fridays.  Recheck in 6 weeks.  [phone]

## 2023-03-29 NOTE — Telephone Encounter (Signed)
 Call placed to the patient. He stated that he has been having shortness of breath for a few months. He saw his PCP for possible pneumonia but the Xray was clear. The patient was thinking the shortness of breath may be from afib but he has not had this problem previously. The shortness of breath is when he ambulates but not when he is at rest but he did state that at times he has to sleep with extra pillows for elevation but this is not consistently.   Blood pressure and heart rate while on the phone was 138/90 and 57. He stated that his heart rate ranges from the 50's-70's. He denies chest pain and swelling.

## 2023-03-29 NOTE — Telephone Encounter (Signed)
 Pt c/o Shortness Of Breath: STAT if SOB developed within the last 24 hours or pt is noticeably SOB on the phone  1. Are you currently SOB (can you hear that pt is SOB on the phone)? yes  2. How long have you been experiencing SOB? 2 months  3. Are you SOB when sitting or when up moving around? Moving around  4. Are you currently experiencing any other symptoms? Intermittent palpitations

## 2023-03-29 NOTE — Telephone Encounter (Signed)
 Patient returned RN's call.

## 2023-03-30 NOTE — Telephone Encounter (Signed)
 Pt inquiring  status update

## 2023-03-31 ENCOUNTER — Encounter: Payer: Self-pay | Admitting: Student

## 2023-03-31 ENCOUNTER — Ambulatory Visit: Attending: Student | Admitting: Student

## 2023-03-31 VITALS — BP 110/70 | HR 84 | Ht 70.0 in | Wt 231.0 lb

## 2023-03-31 DIAGNOSIS — I5022 Chronic systolic (congestive) heart failure: Secondary | ICD-10-CM | POA: Diagnosis not present

## 2023-03-31 DIAGNOSIS — I2510 Atherosclerotic heart disease of native coronary artery without angina pectoris: Secondary | ICD-10-CM | POA: Diagnosis not present

## 2023-03-31 DIAGNOSIS — R0609 Other forms of dyspnea: Secondary | ICD-10-CM | POA: Insufficient documentation

## 2023-03-31 DIAGNOSIS — I10 Essential (primary) hypertension: Secondary | ICD-10-CM | POA: Diagnosis not present

## 2023-03-31 DIAGNOSIS — I4821 Permanent atrial fibrillation: Secondary | ICD-10-CM | POA: Insufficient documentation

## 2023-03-31 NOTE — Patient Instructions (Signed)
 Medication Instructions:  Your Physician recommend you continue on your current medication as directed.    *If you need a refill on your cardiac medications before your next appointment, please call your pharmacy*   Lab Work: None ordered at this time    Testing/Procedures: Your physician has requested that you have an echocardiogram. Echocardiography is a painless test that uses sound waves to create images of your heart. It provides your doctor with information about the size and shape of your heart and how well your heart's chambers and valves are working.   You may receive an ultrasound enhancing agent through an IV if needed to better visualize your heart during the echo. This procedure takes approximately one hour.  There are no restrictions for this procedure.  This will take place at [address] [facility]) [address], [place] [address]  Please note: We ask at that you not bring children with you during ultrasound (echo/ vascular) testing. Due to room size and safety concerns, children are not allowed in the ultrasound rooms during exams. Our front office staff cannot provide observation of children in our lobby area while testing is being conducted. An adult accompanying a patient to their appointment will only be allowed in the ultrasound room at the discretion of the ultrasound technician under special circumstances. We apologize for any inconvenience.   Follow-Up: At [facility], you and your health needs are our priority.  As part of our continuing mission to provide you with exceptional heart care, we have created designated Provider Care Teams.  These Care Teams include your primary Cardiologist (physician) and Advanced Practice Providers (APPs -  Physician Assistants and Nurse Practitioners) who all work together to provide you with the care you need, when you need it.   Your next appointment:   3 day(s)  Provider:   You may see Julien Nordmann, MD  or Carlos Levering, NP

## 2023-03-31 NOTE — Telephone Encounter (Signed)
 Called patient and notified him of the following from Dr. Mariah Milling.  Would recommend a office visit, has been close to a year since I have seen him Myself or APP Hard to say what could be causing his breathing Might need a diuretic but may need additional testing Thx TGollan   Patient verbalized understanding. Patient scheduled to be seen 04/03/23 in clinic.

## 2023-03-31 NOTE — Progress Notes (Signed)
 Cardiology Clinic Note   Date: 03/31/2023 ID: Anthony Basset., DOB [birth year], MRN [id number]  Primary Cardiologist:  Julien Nordmann, MD  Chief Complaint   Anthony Kim. is a 78 y.o. male who presents to the clinic today for evaluation of shortness of breath.   Patient Profile   Anthony Kim. is followed by Dr. Mariah Milling for the history outlined below.      Past medical history significant for: Coronary calcification. Stress test 09/04/2012: Normal, low risk study. Permanent A-fib. Onset June 2018. DCCV 08/03/2016. HFmrEF. Echo 07/15/2016: EF 45 to 50%.  Mild concentric LVH.  Diffuse hypokinesis.  Moderate BAE.  Trivial pericardial effusion posterior to the heart. AAA. Ultrasound aorta/IVC/iliac 08/20/2021: There is evidence of abnormal dilatation of the distal abdominal aorta, largest measurement 3.2 cm.  AAA is mid-distal. Chronic venous insufficiency. Hypertension. Hyperlipidemia. Lipid panel 11/15/2022: LDL 52, HDL 43, TG 139, total 123. OSA. GERD. DVT. MDD/anxiety.  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  Coronary calcifications were found on CTA chest in 2014.  Patient had a normal stress test in August 2014.  Patient found to be in A-fib at an office visit in June 2018.  Echo demonstrated EF 45 to 50% as detailed above.  He underwent DCCV in July 2018 that was initially successful.  Upon follow-up in August 2018 patient was back in A-fib and asymptomatic.  Patient elected rate control strategy.  He has a history of AAA that is being followed by his PCP.  Patient was last seen in the office by Dr. Mariah Milling on 05/24/2022 for routine follow-up.  His BP was slightly elevated at the time of his visit and it was recommended he continue to monitor at home.  Patient declined NOAC.     History of Present Illness    Today, patient reports increased shortness of breath since September 2024. He began noticing shortness of  breath particularly with exertion. He had been walking a mile a day and was not able to do so secondary to dyspnea. He suspected he had PNA. He was evaluated by his PCP in November 2024 and underwent a normal chest xray. His symptoms have been persistent but unchanged since that time. He is currently sleeping on a recliner secondary to orthopnea and occasional PND. He does not weigh himself consistently at home but the couple of times a week he weighs in the evening he feels his weight is stable. He denies increased lower extremity edema. He wears compression socks secondary to history of DVT. He denies palpitations. No report of blood in stool or urine. He is not having chest pain, pressure or tightness.     ROS: All other systems reviewed and are otherwise negative except as noted in History of Present Illness.  EKGs/Labs Reviewed    EKG Interpretation Date/Time:  Friday March 31 2023 15:23:08 EDT Ventricular Rate:  84 PR Interval:    QRS Duration:  92 QT Interval:  360 QTC Calculation: 425 R Axis:   19  Text Interpretation: Atrial fibrillation When compared with ECG of 05/24/2022 (not in [facility]) No significant change was found Confirmed by Anthony Kim [phone]) on 03/31/2023 3:43:30 PM   11/15/2022: ALT 31; AST 23 01/24/2023: BUN 24; Creatinine, Ser 1.17; Potassium 4.6; Sodium 142   11/15/2022: Hemoglobin 14.1; WBC 7.4   01/24/2023: TSH 2.32   Risk Assessment/Calculations     CHA2DS2-VASc Score = 4   This indicates a  4.8% annual risk of stroke. The patient's score is based upon: CHF History: 1 HTN History: 1 Diabetes History: 0 Stroke History: 0 Vascular Disease History: 0 Age Score: 2 Gender Score: 0             Physical Exam    VS:  BP 110/70   Pulse 84   Ht 5\' 10"  (1.778 m)   Wt 231 lb (104.8 kg)   SpO2 96%   BMI 33.15 kg/m  , BMI Body mass index is 33.15 kg/m.  GEN: Well nourished, well developed, in no acute distress. Neck: No JVD or carotid  bruits. Cardiac: Irregular rhythm. No murmurs. No rubs or gallops.   Respiratory:  Respirations regular and unlabored. Clear to auscultation without rales, wheezing or rhonchi. GI: Soft, nontender, nondistended. Extremities: Radials/DP/PT 2+ and equal bilaterally. No clubbing or cyanosis.  Mild nonpitting edema bilateral lower extremities.  Compression socks in place. Skin: Warm and dry, no rash. Neuro: Strength intact.  Assessment & Plan   Coronary calcification Seen on CTA chest in 2018. Normal, low risk stress test in August 2018. Patient denies chest pain, pressure or tightness. He was walking a mile a day up until September when shortness of breath started.  -Will defer ischemic evaluation at this time while awaiting echo results. -Continue metoprolol, atorvastatin, Zetia.  Not on aspirin secondary to Coumadin.  Permanent Afib Onset June 2018. DCCV July 2018 and back in asymptomatic afib August 2018. Patient opted for rate control. Denies spontaneous bleeding concerns. He is completely asymptomatic with no cardiac awareness of arrhythmia. EKG today shows afib 84 bpm.  -Continue metoprolol, Coumadin.  HFmrEF/dyspnea Echo June 2018 showed EF 45 to 50%, mild concentric LVH, diffuse hypokinesis, moderate BAE.  Patient reports increased dyspnea with and without exertion starting in September 2024.  He had been walking a mile a day but stopped secondary to dyspnea.  He suspected pneumonia and went to his PCP in November 2024.  He had a normal chest x-ray.  His symptoms have been persistent but unchanged since onset.  He reports orthopnea and occasional PND.  He does not weigh himself consistently but feels evening weights twice a week are stable.  Patient has mild nonpitting edema otherwise euvolemic and well compensated on exam. -Patient is instructed to weigh daily every morning. -Schedule echo for further evaluation. -Continue losartan. -No indication for diuretic at this time.  Patient  will bring in daily weights at follow-up.  Hypertension BP today 110/70.  Patient reports his blood pressure was elevated and losartan was added.  He then was having some episodes of lightheadedness and medications were further adjusted.  He is no longer having lightheadedness and denies headaches. -Continue clonidine, losartan, metoprolol.  Disposition: Echo.  Return in 3 to 4 weeks after testing or sooner as needed.         Signed, Etta Grandchild. Toyia Jelinek, DNP, NP-C

## 2023-04-03 ENCOUNTER — Ambulatory Visit: Admitting: Student

## 2023-04-07 DIAGNOSIS — H18513 Endothelial corneal dystrophy, bilateral: Secondary | ICD-10-CM | POA: Diagnosis not present

## 2023-04-07 DIAGNOSIS — H35373 Puckering of macula, bilateral: Secondary | ICD-10-CM | POA: Diagnosis not present

## 2023-04-07 DIAGNOSIS — Z961 Presence of intraocular lens: Secondary | ICD-10-CM | POA: Diagnosis not present

## 2023-04-26 ENCOUNTER — Ambulatory Visit: Attending: Student

## 2023-04-26 DIAGNOSIS — R0609 Other forms of dyspnea: Secondary | ICD-10-CM | POA: Insufficient documentation

## 2023-04-26 LAB — ECHOCARDIOGRAM COMPLETE
AR max vel: 3.27 cm2
AV Area VTI: 3.16 cm2
AV Area mean vel: 3.1 cm2
AV Mean grad: 2.3 mmHg
AV Peak grad: 4.2 mmHg
Ao pk vel: 1.03 m/s
Calc EF: 49.9 %
S' Lateral: 3.1 cm
Single Plane A2C EF: 50.2 %
Single Plane A4C EF: 51.8 %

## 2023-04-29 NOTE — Progress Notes (Unsigned)
 Cardiology Clinic Note   Date: 05/01/2023 ID: Anthony Em., DOB Mar 27, 1945, MRN [id number]  Primary Cardiologist:  Belva Boyden, MD  Chief Complaint   Anthony Kim. is a 78 y.o. male who presents to the clinic today for follow up after testing.   Patient Profile   Anthony Kim. is followed by Dr. Gollan for the history outlined below.       Past medical history significant for: Coronary calcification. Stress test 09/04/2012: Normal, low risk study. Permanent A-fib. Onset June 2018. DCCV 08/03/2016. HFmrEF. Echo 04/26/2023: EF 60 to 65%.  No RWMA.  Indeterminate diastolic parameters.  Normal RV size/function.  Moderate LAE.  Aortic valve sclerosis without stenosis.  Trivial pericardial effusion with no evidence of cardiac tamponade. AAA. Ultrasound aorta/IVC/iliac 08/20/2021: There is evidence of abnormal dilatation of the distal abdominal aorta, largest measurement 3.2 cm.  AAA is mid-distal. Chronic venous insufficiency. Hypertension. Hyperlipidemia. Lipid panel 11/15/2022: LDL 52, HDL 43, TG 139, total 123. OSA. GERD. DVT. MDD/anxiety.  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  Coronary calcifications were found on CTA chest in 2014.  Patient had a normal stress test in August 2014.  Patient found to be in A-fib at an office visit in June 2018.  Echo demonstrated EF 45 to 50% as detailed above.  He underwent DCCV in July 2018 that was initially successful.  Upon follow-up in August 2018 patient was back in A-fib and asymptomatic.  Patient elected rate control strategy.  He has a history of AAA that is being followed by his PCP.  Patient was last seen in the office by Dr. Gollan on 05/24/2022 for routine follow-up.  His BP was slightly elevated at the time of his visit and it was recommended he continue to monitor at home.  Patient declined NOAC.   Patient was last seen in the office by me on 03/31/2023 for  evaluation of shortness of breath.  Patient reported progressive dyspnea with exertion since September 2024.  He had been walking a mile a day and had not been able to do so secondary to the shortness of breath.  He was seen by his PCP secondary to believing he had pneumonia in November 2024 and had a normal chest x-ray.  Symptoms have been persistent but unchanged since that time.  He reported sleeping on a recliner secondary to orthopnea and occasional PND.  Evening weights a couple times a week had been stable.  Echo demonstrated normal LV function as detailed above.     History of Present Illness    Today, patient reports continued DOE unchanged since September. He is frustrated as he is not able to do his normal activities without needing to stop and rest. He is able to go back to whatever he is doing but the pattern of work and rest continues until the task is completed. His home weight has been stable. He sleeps on a recliner (since before September) but does find that if he lays back too far he feels short of breath. He has not been paying much attention to his heart rate but feels that it is well controlled. He denies chest pain, pressure or tightness. Discussed results of echo in detail. All questions answered.     ROS: All other systems reviewed and are otherwise negative except as noted in History of Present Illness.  EKGs/Labs Reviewed       No EKG ordered today.  11/15/2022: ALT 31; AST 23 01/24/2023: BUN 24; Creatinine, Ser 1.17; Potassium 4.6; Sodium 142   11/15/2022: Hemoglobin 14.1; WBC 7.4   01/24/2023: TSH 2.32    Risk Assessment/Calculations     CHA2DS2-VASc Score = 4   This indicates a 4.8% annual risk of stroke. The patient's score is based upon: CHF History: 1 HTN History: 1 Diabetes History: 0 Stroke History: 0 Vascular Disease History: 0 Age Score: 2 Gender Score: 0             Physical Exam    VS:  BP 120/66   Pulse 66   Ht 5' 10.5" (1.791 m)    Wt 231 lb 12.8 oz (105.1 kg)   SpO2 98%   BMI 32.79 kg/m  , BMI Body mass index is 32.79 kg/m.  GEN: Well nourished, well developed, in no acute distress. Neck: No JVD or carotid bruits. Cardiac:  RRR. No murmurs. No rubs or gallops.   Respiratory:  Respirations regular and unlabored. Clear to auscultation without rales, wheezing or rhonchi. GI: Soft, nontender, nondistended. Extremities: Radials/DP/PT 2+ and equal bilaterally. No clubbing or cyanosis. No edema.  Skin: Warm and dry, no rash. Neuro: Strength intact.  Assessment & Plan   Coronary calcification/Dyspnea Seen on CTA chest in 2018. Normal, low risk stress test in August 2018. Patient reports continued dyspnea since September. He has to pace normal activities secondary to becoming dyspneic and needing to rest prior to returning to task. Echo was unrevealing and actually showed improved LV function. Concern dyspnea could be anginal equivalent.  -Continue metoprolol, atorvastatin, Zetia.  Not on aspirin secondary to Coumadin. -Schedule PET CT for further evaluation.    Permanent Afib Onset June 2018. DCCV July 2018 and back in asymptomatic afib August 2018. Patient opted for rate control. Denies spontaneous bleeding concerns. He is completely asymptomatic with no cardiac awareness of arrhythmia. He has not been paying much attention to HR but believes it has been well controlled.  Irregular rhythm on exam today. HR 66 bpm. If PET CT is normal will order 1 week Zio to evaluate rate control.   -Continue metoprolol, Coumadin.   HFmrEF Echo April 2025 showed EF 60 to 65%, indeterminate diastolic parameters, normal RV size/function, moderate LAE, aortic valve sclerosis without stenosis, trivial pericardial effusion without cardiac tamponade.  Patient reports continued dyspnea as above. Home weight has been stable. Euvolemic and well compensated on exam.  -Continue losartan. -Schedule PET CT as above.    Hypertension BP today  120/66.   -Continue clonidine, losartan, metoprolol.  Disposition: Schedule PET CT. Return in 6 weeks or sooner as needed.      Informed Consent   Shared Decision Making/Informed Consent The risks [chest pain, shortness of breath, cardiac arrhythmias, dizziness, blood pressure fluctuations, myocardial infarction, stroke/transient ischemic attack, nausea, vomiting, allergic reaction, radiation exposure, metallic taste sensation and life-threatening complications (estimated to be 1 in 10,000)], benefits (risk stratification, diagnosing coronary artery disease, treatment guidance) and alternatives of a cardiac PET stress test were discussed in detail with Mr. Space and he agrees to proceed.      Signed, Lonell Rives. Jeslynn Hollander, DNP, NP-C

## 2023-05-01 ENCOUNTER — Ambulatory Visit: Attending: Student | Admitting: Student

## 2023-05-01 ENCOUNTER — Encounter: Payer: Self-pay | Admitting: Student

## 2023-05-01 VITALS — BP 120/66 | HR 66 | Ht 70.5 in | Wt 231.8 lb

## 2023-05-01 DIAGNOSIS — I10 Essential (primary) hypertension: Secondary | ICD-10-CM | POA: Diagnosis not present

## 2023-05-01 DIAGNOSIS — I2510 Atherosclerotic heart disease of native coronary artery without angina pectoris: Secondary | ICD-10-CM | POA: Diagnosis not present

## 2023-05-01 DIAGNOSIS — I209 Angina pectoris, unspecified: Secondary | ICD-10-CM | POA: Diagnosis not present

## 2023-05-01 DIAGNOSIS — I25119 Atherosclerotic heart disease of native coronary artery with unspecified angina pectoris: Secondary | ICD-10-CM | POA: Diagnosis not present

## 2023-05-01 DIAGNOSIS — R0609 Other forms of dyspnea: Secondary | ICD-10-CM | POA: Insufficient documentation

## 2023-05-01 DIAGNOSIS — I4821 Permanent atrial fibrillation: Secondary | ICD-10-CM | POA: Diagnosis not present

## 2023-05-01 DIAGNOSIS — I5032 Chronic diastolic (congestive) heart failure: Secondary | ICD-10-CM | POA: Diagnosis not present

## 2023-05-01 NOTE — Patient Instructions (Addendum)
 Medication Instructions:  Your physician recommends that you continue on your current medications as directed. Please refer to the Current Medication list given to you today.  *If you need a refill on your cardiac medications before your next appointment, please call your pharmacy*  Lab Work: No labs ordered today  If you have labs (blood work) drawn today and your tests are completely normal, you will receive your results only by: MyChart Message (if you have MyChart) OR A paper copy in the mail If you have any lab test that is abnormal or we need to change your treatment, we will call you to review the results.  Testing/Procedures:    Please report to Radiology at [facility] Main Entrance, medical mall, 30 mins prior to your test.  [address]  [place], [place]  How to Prepare for Your Cardiac PET/CT Stress Test:  Nothing to eat or drink, except water, 3 hours prior to arrival time.  NO caffeine/decaffeinated products, or chocolate 12 hours prior to arrival. (Please note decaffeinated beverages (teas/coffees) still contain caffeine).  If you have caffeine within 12 hours prior, the test will need to be rescheduled.  Medication instructions: Do not take erectile dysfunction medications for 72 hours prior to test (sildenafil, tadalafil) Do not take nitrates (isosorbide mononitrate, Ranexa) the day before or day of test Do not take tamsulosin the day before or morning of test Hold theophylline containing medications for 12 hours. Hold Dipyridamole 48 hours prior to the test.  Diabetic Preparation: If able to eat breakfast prior to 3 hour fasting, you may take all medications, including your insulin. Do not worry if you miss your breakfast dose of insulin - start at your next meal. If you do not eat prior to 3 hour fast-Hold all diabetes (oral and insulin) medications. Patients who wear a continuous glucose monitor MUST remove the device prior to  scanning.  You may take your remaining medications with water.  NO perfume, cologne or lotion on chest or abdomen area.  Total time is 1 to 2 hours; you may want to bring reading material for the waiting time.   In preparation for your appointment, medication and supplies will be purchased.  Appointment availability is limited, so if you need to cancel or reschedule, please call the Radiology Department Scheduler at [phone] 24 hours in advance to avoid a cancellation fee of $100.00  What to Expect When you Arrive:  Once you arrive and check in for your appointment, you will be taken to a preparation room within the Radiology Department.  A technologist or Nurse will obtain your medical history, verify that you are correctly prepped for the exam, and explain the procedure.  Afterwards, an IV will be started in your arm and electrodes will be placed on your skin for EKG monitoring during the stress portion of the exam. Then you will be escorted to the PET/CT scanner.  There, staff will get you positioned on the scanner and obtain a blood pressure and EKG.  During the exam, you will continue to be connected to the EKG and blood pressure machines.  A small, safe amount of a radioactive tracer will be injected in your IV to obtain a series of pictures of your heart along with an injection of a stress agent.    After your Exam:  It is recommended that you eat a meal and drink a caffeinated beverage to counter act any effects of the stress agent.  Drink plenty of fluids  for the remainder of the day and urinate frequently for the first couple of hours after the exam.  Your doctor will inform you of your test results within 7-10 business days.  For more information and frequently asked questions, please visit our website: [email or web address]  For questions about your test or how to prepare for your test, please call: Cardiac Imaging Nurse Navigators Office: [phone]    Follow-Up: At [facility], you and your health needs are our priority.  As part of our continuing mission to provide you with exceptional heart care, our providers are all part of one team.  This team includes your primary Cardiologist (physician) and Advanced Practice Providers or APPs (Physician Assistants and Nurse Practitioners) who all work together to provide you with the care you need, when you need it.  Your next appointment:   6 week(s)  Provider:   You may see Timothy Gollan, MD or one of the following Advanced Practice Providers on your designated Care Team:   Laneta Pintos, NP Gildardo Labrador, PA-C Varney Gentleman, PA-C Cadence Kimberton, PA-C Ronald Cockayne, NP Morey Ar, NP

## 2023-05-08 DIAGNOSIS — Z85828 Personal history of other malignant neoplasm of skin: Secondary | ICD-10-CM | POA: Diagnosis not present

## 2023-05-08 DIAGNOSIS — L814 Other melanin hyperpigmentation: Secondary | ICD-10-CM | POA: Diagnosis not present

## 2023-05-08 DIAGNOSIS — L820 Inflamed seborrheic keratosis: Secondary | ICD-10-CM | POA: Diagnosis not present

## 2023-05-08 DIAGNOSIS — D229 Melanocytic nevi, unspecified: Secondary | ICD-10-CM | POA: Diagnosis not present

## 2023-05-08 DIAGNOSIS — L858 Other specified epidermal thickening: Secondary | ICD-10-CM | POA: Diagnosis not present

## 2023-05-08 DIAGNOSIS — L821 Other seborrheic keratosis: Secondary | ICD-10-CM | POA: Diagnosis not present

## 2023-05-10 ENCOUNTER — Ambulatory Visit: Attending: Cardiovascular Disease

## 2023-05-10 DIAGNOSIS — I4819 Other persistent atrial fibrillation: Secondary | ICD-10-CM | POA: Diagnosis not present

## 2023-05-10 DIAGNOSIS — Z5181 Encounter for therapeutic drug level monitoring: Secondary | ICD-10-CM | POA: Insufficient documentation

## 2023-05-10 LAB — POCT INR: INR: 2.6 (ref 2.0–3.0)

## 2023-05-10 NOTE — Patient Instructions (Signed)
 Continue Warfarin 1 tablet daily except 1.5 tablets on Fridays.  Recheck in 6 weeks.  [phone]

## 2023-05-16 DIAGNOSIS — H43813 Vitreous degeneration, bilateral: Secondary | ICD-10-CM | POA: Diagnosis not present

## 2023-05-16 DIAGNOSIS — Z961 Presence of intraocular lens: Secondary | ICD-10-CM | POA: Diagnosis not present

## 2023-05-16 DIAGNOSIS — H35373 Puckering of macula, bilateral: Secondary | ICD-10-CM | POA: Diagnosis not present

## 2023-05-16 DIAGNOSIS — H18513 Endothelial corneal dystrophy, bilateral: Secondary | ICD-10-CM | POA: Diagnosis not present

## 2023-05-22 DIAGNOSIS — Z961 Presence of intraocular lens: Secondary | ICD-10-CM | POA: Diagnosis not present

## 2023-05-22 DIAGNOSIS — H04123 Dry eye syndrome of bilateral lacrimal glands: Secondary | ICD-10-CM | POA: Diagnosis not present

## 2023-05-22 DIAGNOSIS — H18513 Endothelial corneal dystrophy, bilateral: Secondary | ICD-10-CM | POA: Diagnosis not present

## 2023-05-23 ENCOUNTER — Encounter (HOSPITAL_COMMUNITY): Payer: Self-pay

## 2023-05-25 ENCOUNTER — Ambulatory Visit
Admission: RE | Admit: 2023-05-25 | Discharge: 2023-05-25 | Disposition: A | Discharge status: Home / Self Care | Source: Ambulatory Visit | Attending: Student | Admitting: Student

## 2023-05-25 DIAGNOSIS — I700 Atherosclerosis of aorta: Secondary | ICD-10-CM | POA: Insufficient documentation

## 2023-05-25 DIAGNOSIS — I209 Angina pectoris, unspecified: Secondary | ICD-10-CM

## 2023-05-25 DIAGNOSIS — I517 Cardiomegaly: Secondary | ICD-10-CM | POA: Diagnosis not present

## 2023-05-25 DIAGNOSIS — I25110 Atherosclerotic heart disease of native coronary artery with unstable angina pectoris: Secondary | ICD-10-CM | POA: Diagnosis not present

## 2023-05-25 DIAGNOSIS — K8020 Calculus of gallbladder without cholecystitis without obstruction: Secondary | ICD-10-CM | POA: Insufficient documentation

## 2023-05-25 DIAGNOSIS — I200 Unstable angina: Secondary | ICD-10-CM | POA: Diagnosis present

## 2023-05-25 LAB — NM PET CT CARDIAC PERFUSION MULTI W/ABSOLUTE BLOODFLOW
LV dias vol: 113 mL (ref 62–150)
LV sys vol: 70 mL
MBFR: 1.88
Nuc Rest EF: 38 %
Nuc Stress EF: 45 %
Peak HR: 101 {beats}/min
Rest HR: 85 {beats}/min
Rest MBF: 0.77 ml/g/min
Rest Nuclear Isotope Dose: 24.9 mCi
SRS: 0
SSS: 0
ST Depression (mm): 0 mm
Stress MBF: 1.45 ml/g/min
Stress Nuclear Isotope Dose: 25 mCi
TID: 1.02

## 2023-05-25 MED ORDER — RUBIDIUM RB82 GENERATOR (RUBYFILL)
25.0000 | PACK | Freq: Once | INTRAVENOUS | Status: AC
Start: 2023-05-25 — End: 2023-05-25
  Administered 2023-05-25: 24.94 via INTRAVENOUS

## 2023-05-25 MED ORDER — RUBIDIUM RB82 GENERATOR (RUBYFILL)
25.0000 | PACK | Freq: Once | INTRAVENOUS | Status: AC
Start: 2023-05-25 — End: 2023-05-25
  Administered 2023-05-25: 24.95 via INTRAVENOUS

## 2023-05-25 MED ORDER — REGADENOSON 0.4 MG/5ML IV SOLN
INTRAVENOUS | Status: AC
  Filled 2023-05-25: qty 5

## 2023-05-25 MED ORDER — REGADENOSON 0.4 MG/5ML IV SOLN
0.4000 mg | Freq: Once | INTRAVENOUS | Status: AC
  Administered 2023-05-25: 0.4 mg via INTRAVENOUS
  Filled 2023-05-25: qty 5

## 2023-05-25 NOTE — Progress Notes (Signed)
 Patient presents for a cardiac PET stress test and tolerated procedure without incident. Patient maintained acceptable vital signs throughout the test and was offered caffeine after test.  Patient ambulated out of department with a steady gait.

## 2023-06-01 NOTE — Progress Notes (Signed)
 Cardiology Clinic Note   Date: 06/05/2023 ID: [name], DOB 26-Apr-1945, MRN [id number]  Primary Cardiologist:  Belva Boyden, MD  Chief Complaint   Anthony Kim. is a 78 y.o. male who presents to the clinic today for continued DOE.   Patient Profile   Anthony Kim. is followed by Dr. Gollan for the history outlined below.      Past medical history significant for: Coronary calcification. Stress test 09/04/2012: Normal, low risk study. Cardiac PET/CT 05/25/2023: Normal LV perfusion with no evidence of ischemia, low risk study Permanent A-fib. Onset June 2018. DCCV 08/03/2016. HFmrEF. Echo 04/26/2023: EF 60 to 65%.  No RWMA.  Indeterminate diastolic parameters.  Normal RV size/function.  Moderate LAE.  Aortic valve sclerosis without stenosis.  Trivial pericardial effusion with no evidence of cardiac tamponade. AAA. Ultrasound aorta/IVC/iliac 08/20/2021: There is evidence of abnormal dilatation of the distal abdominal aorta, largest measurement 3.2 cm.  AAA is mid-distal. Chronic venous insufficiency. Hypertension. Hyperlipidemia. Lipid panel 11/15/2022: LDL 52, HDL 43, TG 139, total 123. GERD. DVT. MDD/anxiety.  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  Coronary calcifications were found on CTA chest in 2014.  Patient had a normal stress test in August 2014.  Patient found to be in A-fib at an office visit in June 2018.  Echo demonstrated EF 45 to 50% as detailed above.  He underwent DCCV in July 2018 that was initially successful.  Upon follow-up in August 2018 patient was back in A-fib and asymptomatic.  Patient elected rate control strategy.  He has a history of AAA that is being followed by his PCP.  Patient was last seen in the office by Dr. Gollan on 05/24/2022 for routine follow-up.  His BP was slightly elevated at the time of his visit and it was recommended he continue to monitor at home.  Patient declined  NOAC.   Patient was seen in the office on 03/31/2023 for evaluation of shortness of breath.  Patient reported progressive dyspnea with exertion since September 2024.  He had been sick with URI prior to dyspnea. He had been walking a mile a day and had not been able to do so secondary to the shortness of breath.  He was seen by his PCP secondary to believing he had pneumonia in November 2024 and had a normal chest x-ray.  Symptoms have been persistent but unchanged since that time.  He reported sleeping on a recliner secondary to orthopnea and occasional PND.  Evening weights a couple times a week had been stable.  Echo demonstrated normal LV function as detailed above.   Patient was last seen in the office by me on 05/01/2023 for follow-up after testing.  He reported continued DOE unchanged from September 2024.  He reported frustration over not being able to do normal activities without needing to stop and rest.  Decision was made to undergo cardiac PET/CT for ischemic evaluation which was a low risk study.     History of Present Illness    Today, patient is accompanied by his son. He reports continued DOE that is essentially unchanged since September 2024. He is very frustrated that this has been going on for so long. He is not able to do his normal activities without resting to catch his breath. He denies chest pain, pressure or tightness. He denies increased lower extremity edema. He wears compression socks secondary to history of DVT. Home weight has  been stable. He has chronic orthopnea and sleeps in a recliner before symptoms started in September. Discussed cardiac PET CT in detail and all questions were answered. He wonders if there is something wrong with his lungs. He states he was sick with a URI before his symptoms started and he had a prolonged cough at that time. He did not test for Covid when he was sick.     ROS: All other systems reviewed and are otherwise negative except as noted in  History of Present Illness.  EKGs/Labs Reviewed    EKG Interpretation Date/Time:  Monday Jun 05 2023 10:23:15 EDT Ventricular Rate:  84 PR Interval:    QRS Duration:  86 QT Interval:  384 QTC Calculation: 453 R Axis:   15  Text Interpretation: Atrial fibrillation When compared with ECG of 31-Mar-2023 15:23, No significant change was found Confirmed by Morey Ar [phone]) on 06/05/2023 10:40:22 AM   11/15/2022: ALT 31; AST 23 01/24/2023: BUN 24; Creatinine, Ser 1.17; Potassium 4.6; Sodium 142   11/15/2022: Hemoglobin 14.1; WBC 7.4   01/24/2023: TSH 2.32    Risk Assessment/Calculations     CHA2DS2-VASc Score = 4   This indicates a 4.8% annual risk of stroke. The patient's score is based upon: CHF History: 1 HTN History: 1 Diabetes History: 0 Stroke History: 0 Vascular Disease History: 0 Age Score: 2 Gender Score: 0             Physical Exam    VS:  BP 126/80   Pulse 84   Ht 5' 10.5" (1.791 m)   Wt 234 lb (106.1 kg)   SpO2 95%   BMI 33.10 kg/m  , BMI Body mass index is 33.1 kg/m.  GEN: Well nourished, well developed, in no acute distress. Neck: No JVD or carotid bruits. Cardiac: Irregularly irregular rhythm. No murmurs. No rubs or gallops.   Respiratory:  Respirations regular and unlabored. Clear to auscultation without rales, wheezing or rhonchi. GI: Soft, nontender, nondistended. Extremities: Radials/DP/PT 2+ and equal bilaterally. No clubbing or cyanosis. No edema. Compression stockings in place.   Skin: Warm and dry, no rash. Neuro: Strength intact.  Assessment & Plan   Coronary calcification/Dyspnea Seen on CTA chest in 2018. Normal, low risk stress test in August 2018.  Cardiac PET/CT May 2025 normal, low risk study.  Patient denies chest pain. He reports continued DOE that is unchanged since September. All cardiac testing has been essentially normal.  - Refer to pulmonology.  -Continue metoprolol , atorvastatin , Zetia .  Not on aspirin  secondary  to Coumadin .   Permanent Afib Onset June 2018. DCCV July 2018 and back in asymptomatic afib August 2018. Patient opted for rate control. Denies spontaneous bleeding concerns. He is completely asymptomatic with no cardiac awareness of arrhythmia.  Irregular rhythm on exam today. -Continue metoprolol , Coumadin .   HFmrEF Echo April 2025 showed EF 60 to 65%, indeterminate diastolic parameters, normal RV size/function, moderate LAE, aortic valve sclerosis without stenosis, trivial pericardial effusion without cardiac tamponade.  Patient reports stable lower extremity edema. Wears compression stockings secondary to history of DVT. Home weight has been stable. Euvolemic and well compensated on exam.  -Continue losartan .   Hypertension BP today 126/80.   -Continue clonidine , losartan , metoprolol .  Disposition: Refer to pulmonology. Return in 6 months or sooner as needed.           Signed, Lonell Rives. Pieper Kasik, DNP, NP-C

## 2023-06-05 ENCOUNTER — Ambulatory Visit: Admitting: Pulmonary Disease

## 2023-06-05 ENCOUNTER — Ambulatory Visit: Attending: Student | Admitting: Student

## 2023-06-05 ENCOUNTER — Encounter: Payer: Self-pay | Admitting: Pulmonary Disease

## 2023-06-05 ENCOUNTER — Encounter: Payer: Self-pay | Admitting: Student

## 2023-06-05 VITALS — BP 126/80 | HR 84 | Ht 70.5 in | Wt 234.0 lb

## 2023-06-05 VITALS — BP 122/80 | HR 90 | Temp 96.9°F | Ht 70.5 in | Wt 234.2 lb

## 2023-06-05 DIAGNOSIS — I10 Essential (primary) hypertension: Secondary | ICD-10-CM | POA: Insufficient documentation

## 2023-06-05 DIAGNOSIS — I4821 Permanent atrial fibrillation: Secondary | ICD-10-CM | POA: Insufficient documentation

## 2023-06-05 DIAGNOSIS — I5032 Chronic diastolic (congestive) heart failure: Secondary | ICD-10-CM | POA: Diagnosis not present

## 2023-06-05 DIAGNOSIS — I2510 Atherosclerotic heart disease of native coronary artery without angina pectoris: Secondary | ICD-10-CM | POA: Diagnosis not present

## 2023-06-05 DIAGNOSIS — R0609 Other forms of dyspnea: Secondary | ICD-10-CM | POA: Diagnosis not present

## 2023-06-05 LAB — NITRIC OXIDE: Nitric Oxide: 27

## 2023-06-05 MED ORDER — BUDESONIDE-FORMOTEROL FUMARATE 160-4.5 MCG/ACT IN AERO: 2.0000 | INHALATION_SPRAY | Freq: Two times a day (BID) | RESPIRATORY_TRACT | 12 refills | Status: DC

## 2023-06-05 MED ORDER — AEROCHAMBER MV MISC: 0 refills | Status: DC

## 2023-06-05 NOTE — Patient Instructions (Addendum)
 Medication Instructions:  Your Physician recommend you continue on your current medication as directed.    *If you need a refill on your cardiac medications before your next appointment, please call your pharmacy*  Lab Work: None ordered at this time  If you have labs (blood work) drawn today and your tests are completely normal, you will receive your results only by: MyChart Message (if you have MyChart) OR A paper copy in the mail If you have any lab test that is abnormal or we need to change your treatment, we will call you to review the results.  Testing/Procedures: None ordered at this time   Follow-Up: At [facility], you and your health needs are our priority.  As part of our continuing mission to provide you with exceptional heart care, our providers are all part of one team.  This team includes your primary Cardiologist (physician) and Advanced Practice Providers or APPs (Physician Assistants and Nurse Practitioners) who all work together to provide you with the care you need, when you need it.  Your next appointment:   6 month(s)  Provider:   You may see Timothy Gollan, MD or one of the following Advanced Practice Providers on your designated Care Team:   Laneta Pintos, NP Gildardo Labrador, PA-C Varney Gentleman, PA-C Cadence [place], PA-C Ronald Cockayne, NP Morey Ar, NP    We recommend signing up for the patient portal called "MyChart".  Sign up information is provided on this After Visit Summary.  MyChart is used to connect with patients for Virtual Visits (Telemedicine).  Patients are able to view lab/test results, encounter notes, upcoming appointments, etc.  Non-urgent messages can be sent to your provider as well.   To learn more about what you can do with MyChart, go to [email or web address].   Other Instructions  Your cardiologist has referred you to Pulmonology.  We have attached their office location and phone number below.  Please allow them 3-5  business days to reach out to you to make an appointment.  If you have not heard from their office within that time, please call them to schedule your appointment.

## 2023-06-05 NOTE — Progress Notes (Signed)
 Synopsis: Referred in by Morey Ar, NP   Subjective:   PATIENT ID: Anthony Kim, Anthony Kim, Anthony Kim  Chief Complaint  Patient presents with   Consult    DOE for a year after being sick. Little wheezing. Cough, dry.     HPI Anthony Kim is a pleasant 78 year old male patient with a past medical history of hyperlipidemia, hypertension, allergic rhinitis A-fib on Coumadin  presenting today to the pulmonary clinic for ongoing dyspnea on exertion.  Since September he has been complaining of worsening dyspnea on exertion after he had a URI.  He was able to walk a mile to a mile and a half however recently he is unable to walk couple 100 feet before pausing for a break.  It is associated with a cough that is mostly dry.  Denies any chest tightness or wheezing.  He does have hypersensitivity to strong scents.  No history of asthma and no eczema.  Chest x-ray 11/24 without any active cardiopulmonary disease.  He had an extensive cardiac workup which has been unrevealing including perfusion scan which showed normal LV perfusion, no regional wall motion abnormality.  Echocardiogram 04/26/2023 with normal EF.  Family history -father with asthma.  Social history -quit smoking 32 years ago smoked 1 pack/day for 30 years.   ROS All systems were reviewed and are negative except for the above.  Objective:   Vitals:   06/05/23 1323  BP: 122/80  Pulse: 90  Temp: (!) 96.9 F (36.1 C)  SpO2: 95%  Weight: 234 lb 3.2 oz (106.2 kg)  Height: 5' 10.5" (1.791 m)   95% on RA BMI Readings from Last 3 Encounters:  06/05/23 33.13 kg/m  06/05/23 33.10 kg/m  05/01/23 32.79 kg/m   Wt Readings from Last 3 Encounters:  06/05/23 234 lb 3.2 oz (106.2 kg)  06/05/23 234 lb (106.1 kg)  05/01/23 231 lb 12.8 oz (105.1 kg)    Physical Exam GEN: NAD, Healthy Appearing HEENT: Supple Neck, Reactive Pupils, EOMI  CVS: Normal S1, Normal S2, RRR, No murmurs or ES  appreciated  Lungs: Clear bilateral air entry.  Abdomen: Soft, non tender, non distended, + BS  Extremities: Warm and well perfused, No edema  Skin: No suspicious lesions appreciated  Psych: Normal Affect  Ancillary Information   CBC    Component Value Date/Time   WBC 7.4 11/15/2022 0905   RBC 4.44 11/15/2022 0905   HGB 14.1 11/15/2022 0905   HGB 14.4 Kim/09/2016 0829   HCT 43.3 11/15/2022 0905   HCT 43.8 Kim/09/2016 0829   PLT 169.0 11/15/2022 0905   PLT 180 Kim/09/2016 0829   MCV 97.6 11/15/2022 0905   MCV 92 Kim/09/2016 0829   MCH 31.9 09/16/2018 1203   MCHC 32.5 11/15/2022 0905   RDW 14.0 11/15/2022 0905   RDW 14.0 Kim/09/2016 0829   LYMPHSABS 2.5 11/15/2022 0905   LYMPHSABS 2.5 Kim/09/2016 0829   MONOABS 0.6 11/15/2022 0905   EOSABS 0.2 11/15/2022 0905   EOSABS 0.2 Kim/09/2016 0829   BASOSABS 0.0 11/15/2022 0905   BASOSABS 0.0 Kim/09/2016 0829   Labs and imaging were reviewed.     No data to display           Assessment & Plan:  Anthony Kim is a pleasant 78 year old male patient with a past medical history of hyperlipidemia, hypertension, allergic rhinitis A-fib on Coumadin  presenting today to the pulmonary clinic for ongoing dyspnea on exertion.  #Shortness of breath on exertion  Started after  a URI in September, unclear if it was COVID. Cardiac work up has been negative. Impression is that this is consistent with post viral reactive airway disease/Asthma. CXR is normal.  FENO 27 indeterminate for pulmonary eosinophilic inflammation.   []  PFTs  []  Start budesonide -formoterol  [Symbicort ] 160-4.5 2 puffs BID. Rinse mouth after each use. Provided aerochamber.   Return in about 4 months (around 10/06/2023).  I spent 60 minutes caring for this patient today, including preparing to see the patient, obtaining a medical history , reviewing a separately obtained history, counseling and educating the patient/family/caregiver, ordering medications, tests, or procedures,  documenting clinical information in the electronic health record, and independently interpreting results (not separately reported/billed) and communicating results to the patient/family/caregiver  Annitta Kindler, MD [facility] 06/05/2023 5:09 PM

## 2023-06-10 ENCOUNTER — Other Ambulatory Visit: Payer: Self-pay | Admitting: Cardiovascular Disease

## 2023-06-16 ENCOUNTER — Ambulatory Visit: Admitting: Student

## 2023-06-21 ENCOUNTER — Ambulatory Visit: Attending: Cardiovascular Disease

## 2023-06-21 DIAGNOSIS — Z5181 Encounter for therapeutic drug level monitoring: Secondary | ICD-10-CM

## 2023-06-21 DIAGNOSIS — I4819 Other persistent atrial fibrillation: Secondary | ICD-10-CM

## 2023-06-21 LAB — POCT INR: INR: 2.4 (ref 2.0–3.0)

## 2023-06-21 NOTE — Patient Instructions (Signed)
 Continue Warfarin 1 tablet daily except 1.5 tablets on Fridays.  Recheck in 6 weeks.  [phone]

## 2023-06-28 DIAGNOSIS — H903 Sensorineural hearing loss, bilateral: Secondary | ICD-10-CM | POA: Diagnosis not present

## 2023-06-28 DIAGNOSIS — H6123 Impacted cerumen, bilateral: Secondary | ICD-10-CM | POA: Diagnosis not present

## 2023-08-02 ENCOUNTER — Ambulatory Visit: Attending: Cardiovascular Disease

## 2023-08-02 DIAGNOSIS — Z5181 Encounter for therapeutic drug level monitoring: Secondary | ICD-10-CM | POA: Diagnosis not present

## 2023-08-02 DIAGNOSIS — I4819 Other persistent atrial fibrillation: Secondary | ICD-10-CM | POA: Insufficient documentation

## 2023-08-02 LAB — POCT INR: INR: 2.2 (ref 2.0–3.0)

## 2023-08-02 NOTE — Patient Instructions (Signed)
 Continue Warfarin 1 tablet daily except 1.5 tablets on Fridays.  Recheck in 6 weeks.  [phone]

## 2023-08-10 ENCOUNTER — Other Ambulatory Visit: Payer: Self-pay | Admitting: Cardiovascular Disease

## 2023-08-14 ENCOUNTER — Other Ambulatory Visit: Payer: Self-pay | Admitting: Cardiovascular Disease

## 2023-08-14 DIAGNOSIS — L820 Inflamed seborrheic keratosis: Secondary | ICD-10-CM | POA: Diagnosis not present

## 2023-09-13 ENCOUNTER — Ambulatory Visit: Attending: Cardiovascular Disease

## 2023-09-13 DIAGNOSIS — Z5181 Encounter for therapeutic drug level monitoring: Secondary | ICD-10-CM | POA: Diagnosis not present

## 2023-09-13 DIAGNOSIS — I4819 Other persistent atrial fibrillation: Secondary | ICD-10-CM | POA: Insufficient documentation

## 2023-09-13 LAB — POCT INR: INR: 2.1 (ref 2.0–3.0)

## 2023-09-13 NOTE — Patient Instructions (Signed)
 Take 1 tablet tonight only then Continue Warfarin 1 tablet daily except 1.5 tablets on Fridays.  Recheck in 6 weeks.  [phone]

## 2023-09-25 ENCOUNTER — Ambulatory Visit (INDEPENDENT_AMBULATORY_CARE_PROVIDER_SITE_OTHER): Admitting: Pulmonary Disease

## 2023-09-25 ENCOUNTER — Encounter: Payer: Self-pay | Admitting: Pulmonary Disease

## 2023-09-25 ENCOUNTER — Ambulatory Visit: Admitting: Pulmonary Disease

## 2023-09-25 VITALS — BP 126/80 | HR 81 | Temp 97.1°F | Ht 70.5 in | Wt 236.6 lb

## 2023-09-25 DIAGNOSIS — R0609 Other forms of dyspnea: Secondary | ICD-10-CM

## 2023-09-25 DIAGNOSIS — Z87891 Personal history of nicotine dependence: Secondary | ICD-10-CM

## 2023-09-25 DIAGNOSIS — R0602 Shortness of breath: Secondary | ICD-10-CM | POA: Diagnosis not present

## 2023-09-25 LAB — PULMONARY FUNCTION TEST
DL/VA % pred: 94 %
DL/VA: 3.62 ml/min/mmHg/L
DLCO unc % pred: 95 %
DLCO unc: 23.59 ml/min/mmHg
FEF 25-75 Post: 4.55 L/s
FEF 25-75 Pre: 4.4 L/s
FEF2575-%Change-Post: 3 %
FEF2575-%Pred-Post: 219 %
FEF2575-%Pred-Pre: 212 %
FEV1-%Change-Post: 0 %
FEV1-%Pred-Post: 119 %
FEV1-%Pred-Pre: 118 %
FEV1-Post: 3.54 L
FEV1-Pre: 3.53 L
FEV1FVC-%Change-Post: 0 %
FEV1FVC-%Pred-Pre: 116 %
FEV6-%Change-Post: 0 %
FEV6-%Pred-Post: 108 %
FEV6-%Pred-Pre: 108 %
FEV6-Post: 4.21 L
FEV6-Pre: 4.23 L
FEV6FVC-%Change-Post: 0 %
FEV6FVC-%Pred-Post: 106 %
FEV6FVC-%Pred-Pre: 106 %
FVC-%Change-Post: 0 %
FVC-%Pred-Post: 102 %
FVC-%Pred-Pre: 102 %
FVC-Post: 4.25 L
FVC-Pre: 4.24 L
Post FEV1/FVC ratio: 83 %
Post FEV6/FVC ratio: 99 %
Pre FEV1/FVC ratio: 83 %
Pre FEV6/FVC Ratio: 100 %
RV % pred: 76 %
RV: 2.05 L
TLC % pred: 87 %
TLC: 6.27 L

## 2023-09-25 MED ORDER — ALBUTEROL SULFATE HFA 108 (90 BASE) MCG/ACT IN AERS: 2.0000 | INHALATION_SPRAY | Freq: Four times a day (QID) | RESPIRATORY_TRACT | 6 refills | Status: AC | PRN

## 2023-09-25 MED ORDER — TRELEGY ELLIPTA 200-62.5-25 MCG/ACT IN AEPB: 1.0000 | INHALATION_SPRAY | Freq: Every day | RESPIRATORY_TRACT | 6 refills | Status: AC

## 2023-09-25 NOTE — Progress Notes (Signed)
 Synopsis: Referred in by Malka Domino, MD   Subjective:   PATIENT ID: Anthony Kim. GENDER: male DOB: Aug 23, 1945, MRN: [id number]  Chief Complaint  Patient presents with   Medical Management of Chronic Issues    Better since the weather is changing. Still has hoarseness. Wheezing.     HPI Anthony Kim is a pleasant 78 year old male patient with a past medical history of hyperlipidemia, hypertension, allergic rhinitis A-fib on Coumadin  presenting today to the pulmonary clinic for ongoing dyspnea on exertion.  Since September he has been complaining of worsening dyspnea on exertion after he had a URI.  He was able to walk a mile to a mile and a half however recently he is unable to walk couple 100 feet before pausing for a break.  It is associated with a cough that is mostly dry.  Denies any chest tightness or wheezing.  He does have hypersensitivity to strong scents.  No history of asthma and no eczema.  Chest x-ray 11/24 without any active cardiopulmonary disease.  He had an extensive cardiac workup which has been unrevealing including perfusion scan which showed normal LV perfusion, no regional wall motion abnormality.  Echocardiogram 04/26/2023 with normal EF.  Family history -father with asthma.  Social history -quit smoking 32 years ago smoked 1 pack/day for 30 years.   OV 09/25/2023 - [name] is here to follow up on his PFTs. He reports that symbicort  did help him but not much, it is easier for him to breath when it is lower humidity and in the air conditioner. PFTs were normal however TLC at [facility] and PEFR is elevated suggesting an early restrictive pattern but could be seen in obesity. I will change his inhaler to Trelegy 200 and obtain a CT chest wo contrast to assess for any ILD.   ROS All systems were reviewed and are negative except for the above.  Objective:   Vitals:   09/25/23 1418  BP: 126/80  Pulse: 81  Temp: (!) 97.1 F (36.2 C)  SpO2: 97%  Weight:  236 lb 9.6 oz (107.3 kg)  Height: 5' 10.5 (1.791 m)   97% on RA BMI Readings from Last 3 Encounters:  09/25/23 33.47 kg/m  09/25/23 33.47 kg/m  06/05/23 33.13 kg/m   Wt Readings from Last 3 Encounters:  09/25/23 236 lb 9.6 oz (107.3 kg)  09/25/23 236 lb 9.6 oz (107.3 kg)  06/05/23 234 lb 3.2 oz (106.2 kg)    Physical Exam GEN: NAD, Healthy Appearing HEENT: Supple Neck, Reactive Pupils, EOMI  CVS: Normal S1, Normal S2, RRR, No murmurs or ES appreciated  Lungs: Clear bilateral air entry.  Abdomen: Soft, non tender, non distended, + BS  Extremities: Warm and well perfused, No edema  Skin: No suspicious lesions appreciated  Psych: Normal Affect  Ancillary Information   CBC    Component Value Date/Time   WBC 7.4 11/15/2022 0905   RBC 4.44 11/15/2022 0905   HGB 14.1 11/15/2022 0905   HGB 14.4 07/25/2016 0829   HCT 43.3 11/15/2022 0905   HCT 43.8 07/25/2016 0829   PLT 169.0 11/15/2022 0905   PLT 180 07/25/2016 0829   MCV 97.6 11/15/2022 0905   MCV 92 07/25/2016 0829   MCH 31.9 09/16/2018 1203   MCHC 32.5 11/15/2022 0905   RDW 14.0 11/15/2022 0905   RDW 14.0 07/25/2016 0829   LYMPHSABS 2.5 11/15/2022 0905   LYMPHSABS 2.5 07/25/2016 0829   MONOABS 0.6 11/15/2022 0905   EOSABS 0.2 11/15/2022  0905   EOSABS 0.2 07/25/2016 0829   BASOSABS 0.0 11/15/2022 0905   BASOSABS 0.0 07/25/2016 0829   Labs and imaging were reviewed.    Latest Ref Rng & Units 09/25/2023   12:29 PM  PFT Results  FVC-Pre L 4.24  P  FVC-Predicted Pre % 102  P  FVC-Post L 4.25  P  FVC-Predicted Post % 102  P  Pre FEV1/FVC % % 83  P  Post FEV1/FCV % % 83  P  FEV1-Pre L 3.53  P  FEV1-Predicted Pre % 118  P  FEV1-Post L 3.54  P  DLCO uncorrected ml/min/mmHg 23.59  P  DLCO UNC% % 95  P  DLVA Predicted % 94  P  TLC L 6.27  P  TLC % Predicted % 87  P  RV % Predicted % 76  P    P Preliminary result     Assessment & Plan:  Anthony Kim is a pleasant 78 year old male patient with a past medical  history of hyperlipidemia, hypertension, allergic rhinitis A-fib on Coumadin  presenting today to the pulmonary clinic for ongoing dyspnea on exertion.  #Shortness of breath on exertion  Started after a URI in September, unclear if it was COVID. Cardiac work up has been negative. Impression is that this is consistent with post viral reactive airway disease/Asthma. CXR is normal.  FENO 27 indeterminate for pulmonary eosinophilic inflammation.  PFTs normal without any significant response to bdl.  This is still seen in Asthma and it remains the working diagnosis.  PFTs did show TLC at [facility] and increased PEFR for which I will obtain a CT chest wo contrast to evaluate for any ILD.   []  Switch to Trelegy Ellipta  200 1 puff daily. Rinse mouth after each use. []  CT chest wo contrast.   RTC 3 months.   I spent 30 minutes caring for this patient today, including preparing to see the patient, obtaining a medical history , reviewing a separately obtained history, counseling and educating the patient/family/caregiver, ordering medications, tests, or procedures, documenting clinical information in the electronic health record, and independently interpreting results (not separately reported/billed) and communicating results to the patient/family/caregiver  Darrin Barn, MD [facility] 09/25/2023 2:20 PM

## 2023-09-25 NOTE — Patient Instructions (Signed)
 Full PFT completed today ? ?

## 2023-09-25 NOTE — Progress Notes (Signed)
 Full PFT completed today ? ?

## 2023-09-27 ENCOUNTER — Other Ambulatory Visit: Payer: Self-pay | Admitting: Cardiovascular Disease

## 2023-09-27 DIAGNOSIS — I4819 Other persistent atrial fibrillation: Secondary | ICD-10-CM

## 2023-10-25 ENCOUNTER — Ambulatory Visit: Attending: Cardiovascular Disease

## 2023-10-25 DIAGNOSIS — Z5181 Encounter for therapeutic drug level monitoring: Secondary | ICD-10-CM | POA: Insufficient documentation

## 2023-10-25 DIAGNOSIS — I4819 Other persistent atrial fibrillation: Secondary | ICD-10-CM | POA: Diagnosis not present

## 2023-10-25 LAB — POCT INR: INR: 2.1 (ref 2.0–3.0)

## 2023-10-25 NOTE — Progress Notes (Signed)
 done

## 2023-10-25 NOTE — Patient Instructions (Signed)
 Continue Warfarin 1 tablet daily except 1.5 tablets on Fridays.  Recheck in 6 weeks.  [phone]

## 2023-11-05 ENCOUNTER — Other Ambulatory Visit: Payer: Self-pay | Admitting: Cardiovascular Disease

## 2023-11-05 DIAGNOSIS — I4819 Other persistent atrial fibrillation: Secondary | ICD-10-CM

## 2023-11-11 NOTE — Progress Notes (Unsigned)
 Cardiology Clinic Note   Date: 11/15/2023 ID: Anthony Trupiano., DOB 04/08/1945, MRN [id number]  Primary Cardiologist:  Evalene Lunger, MD  Chief Complaint   Anthony Brechtel. is a 78 y.o. male who presents to the clinic today for routine follow up.   Patient Profile   Anthony Dimarco. is followed by Dr. Gollan for the history outlined below.      Past medical history significant for: Coronary calcification. Stress test 09/04/2012: Normal, low risk study. Cardiac PET/CT 05/25/2023: Normal LV perfusion with no evidence of ischemia, low risk study Permanent A-fib. Onset June 2018. DCCV 08/03/2016. HFmrEF. Echo 04/26/2023: EF 60 to 65%.  No RWMA.  Indeterminate diastolic parameters.  Normal RV size/function.  Moderate LAE.  Aortic valve sclerosis without stenosis.  Trivial pericardial effusion with no evidence of cardiac tamponade. AAA. Ultrasound aorta/IVC/iliac 08/20/2021: There is evidence of abnormal dilatation of the distal abdominal aorta, largest measurement 3.2 cm.  AAA is mid-distal. Chronic venous insufficiency. Hypertension. Hyperlipidemia. Lipid panel 11/15/2022: LDL 52, HDL 43, TG 139, total 123. GERD. DVT. MDD/anxiety.  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  Coronary calcifications were found on CTA chest in 2014.  Patient had a normal stress test in August 2014.  Patient found to be in A-fib at an office visit in June 2018.  Echo demonstrated EF 45 to 50% as detailed above.  He underwent DCCV in July 2018 that was initially successful.  Upon follow-up in August 2018 patient was back in A-fib and asymptomatic.  Patient elected rate control strategy.  He has a history of AAA that is being followed by his PCP.  Patient was last seen in the office by Dr. Gollan on 05/24/2022 for routine follow-up.  His BP was slightly elevated at the time of his visit and it was recommended he continue to monitor at home.  Patient  declined NOAC.   Patient was seen in the office on 03/31/2023 for evaluation of shortness of breath.  Patient reported progressive dyspnea with exertion since September 2024.  He had been sick with URI prior to dyspnea. He had been walking a mile a day and had not been able to do so secondary to the shortness of breath.  He was seen by his PCP secondary to believing he had pneumonia in November 2024 and had a normal chest x-ray.  Symptoms have been persistent but unchanged since that time.  He reported sleeping on a recliner secondary to orthopnea and occasional PND.  Evening weights a couple times a week had been stable.  Echo demonstrated normal LV function as detailed above.  Upon follow-up after testing he reported continued DOE unchanged from September 2024.  He reported frustration over not being able to do normal activities without needing to stop and rest.  Decision was made to undergo cardiac PET/CT for ischemic evaluation which was a low risk study.   Patient was last seen in the office by me on 06/05/2023 for follow-up after stress test.  He continued to have DOE unchanged from September 2024 when he had a URI (unclear if it was COVID).  He reported frustration with needing to rest to catch his breath during normal activities.  He was referred to pulmonology for further evaluation.     History of Present Illness    Today, patient is doing well. Patient denies orthopnea or PND. Chronic lower extremity edema is managed with compression and is stable.  He was evaluated by pulmonology and told he has asthma. The first inhaler he tried did not make a difference but he reports he is no longer experiencing DOE since starting on Trelegy. No chest pain, pressure, or tightness. No palpitations. He is walking more for exercise now that he is not experiencing dyspnea.     ROS: All other systems reviewed and are otherwise negative except as noted in History of Present Illness.  EKGs/Labs Reviewed    EKG  Interpretation Date/Time:  Wednesday November 15 2023 10:01:37 EDT Ventricular Rate:  78 PR Interval:    QRS Duration:  92 QT Interval:  370 QTC Calculation: 421 R Axis:   2  Text Interpretation: Atrial fibrillation When compared with ECG of 05-Jun-2023 10:23, No significant change was found Confirmed by Loistine Sober [id number]) on 11/15/2023 10:06:40 AM   01/24/2023: BUN 24; Creatinine, Ser 1.17; Potassium 4.6; Sodium 142   No results found for requested labs within last 365 days.   01/24/2023: TSH 2.32    Risk Assessment/Calculations     CHA2DS2-VASc Score = 4   This indicates a 4.8% annual risk of stroke. The patient's score is based upon: CHF History: 1 HTN History: 1 Diabetes History: 0 Stroke History: 0 Vascular Disease History: 0 Age Score: 2 Gender Score: 0             Physical Exam    VS:  BP 118/82 (BP Location: Left Arm, Patient Position: Sitting, Cuff Size: Large)   Ht 5' 10 (1.778 m)   Wt 239 lb (108.4 kg)   SpO2 96%   BMI 34.29 kg/m  , BMI Body mass index is 34.29 kg/m.  GEN: Well nourished, well developed, in no acute distress. Neck: No JVD or carotid bruits. Cardiac: Irregularly irregular rhytym.  No murmur. No rubs or gallops.   Respiratory:  Respirations regular and unlabored. Clear to auscultation without rales, wheezing or rhonchi. GI: Soft, nontender, nondistended. Extremities: Radials/DP/PT 2+ and equal bilaterally. No clubbing or cyanosis. Compression stockings in place.   Skin: Warm and dry, no rash. Neuro: Strength intact.  Assessment & Plan   Coronary calcification/Dyspnea Seen on CTA chest in 2018. Normal, low risk stress test in August 2018.  Cardiac PET/CT May 2025 normal, low risk study.  Patient denies chest pain. Dyspnea has resolved with the use of Trelegy. He is walking for exercise with good tolerance.  - Continue metoprolol , atorvastatin , Zetia .  Not on aspirin  secondary to Coumadin .   Permanent Afib Onset June 2018. DCCV  July 2018 and back in asymptomatic afib August 2018. Patient opted for rate control. Denies spontaneous bleeding concerns. He is completely asymptomatic with no cardiac awareness of arrhythmia. EKG shows afib 78 bpm.  - Continue metoprolol , Coumadin .   HFmrEF Echo April 2025 showed EF 60 to 65%, indeterminate diastolic parameters, normal RV size/function, moderate LAE, aortic valve sclerosis without stenosis, trivial pericardial effusion without cardiac tamponade.  Patient reports stable lower extremity edema. Wears compression stockings secondary to history of DVT. Home weight has been stable. Euvolemic and well compensated on exam. - Continue losartan .   Hypertension BP today 118/82. No report of headaches or dizziness.   - Continue clonidine , losartan , metoprolol .  Hyperlipidemia LDL 52 October 2024, at goal.  - Continue atorvastatin  and Zetia .  - Upcoming lipid panel with PCP.   Disposition: Return in 6 months or sooner as needed.          Signed, Sober HERO. Anthony Gales, DNP, NP-C

## 2023-11-13 ENCOUNTER — Other Ambulatory Visit: Payer: Self-pay | Admitting: Cardiovascular Disease

## 2023-11-14 ENCOUNTER — Other Ambulatory Visit: Payer: Self-pay | Admitting: Family Medicine

## 2023-11-14 DIAGNOSIS — I4819 Other persistent atrial fibrillation: Secondary | ICD-10-CM

## 2023-11-14 DIAGNOSIS — N289 Disorder of kidney and ureter, unspecified: Secondary | ICD-10-CM

## 2023-11-14 DIAGNOSIS — E7800 Pure hypercholesterolemia, unspecified: Secondary | ICD-10-CM

## 2023-11-15 ENCOUNTER — Ambulatory Visit: Attending: Student | Admitting: Student

## 2023-11-15 ENCOUNTER — Encounter: Payer: Self-pay | Admitting: Student

## 2023-11-15 VITALS — BP 118/82 | Ht 70.0 in | Wt 239.0 lb

## 2023-11-15 DIAGNOSIS — R0609 Other forms of dyspnea: Secondary | ICD-10-CM | POA: Insufficient documentation

## 2023-11-15 DIAGNOSIS — I2510 Atherosclerotic heart disease of native coronary artery without angina pectoris: Secondary | ICD-10-CM | POA: Diagnosis not present

## 2023-11-15 DIAGNOSIS — I4821 Permanent atrial fibrillation: Secondary | ICD-10-CM | POA: Insufficient documentation

## 2023-11-15 DIAGNOSIS — I5020 Unspecified systolic (congestive) heart failure: Secondary | ICD-10-CM | POA: Diagnosis not present

## 2023-11-15 DIAGNOSIS — I10 Essential (primary) hypertension: Secondary | ICD-10-CM | POA: Insufficient documentation

## 2023-11-15 DIAGNOSIS — E785 Hyperlipidemia, unspecified: Secondary | ICD-10-CM | POA: Diagnosis not present

## 2023-11-15 NOTE — Patient Instructions (Addendum)
 Medication Instructions:   Your physician recommends that you continue on your current medications as directed. Please refer to the Current Medication list given to you today.    *If you need a refill on your cardiac medications before your next appointment, please call your pharmacy*  Lab Work:  None ordered at this time   If you have labs (blood work) drawn today and your tests are completely normal, you will receive your results only by:  MyChart Message (if you have MyChart) OR  A paper copy in the mail If you have any lab test that is abnormal or we need to change your treatment, we will call you to review the results.  Testing/Procedures:  None ordered at this time   Referrals:  None ordered at this time   Follow-Up:  At [facility], you and your health needs are our priority.  As part of our continuing mission to provide you with exceptional heart care, our providers are all part of one team.  This team includes your primary Cardiologist (physician) and Advanced Practice Providers or APPs (Physician Assistants and Nurse Practitioners) who all work together to provide you with the care you need, when you need it.  Your next appointment:   5 - 6 month(s)  Provider:    Evalene Lunger, MD or Barnie Hila, NP    We recommend signing up for the patient portal called MyChart.  Sign up information is provided on this After Visit Summary.  MyChart is used to connect with patients for Virtual Visits (Telemedicine).  Patients are able to view lab/test results, encounter notes, upcoming appointments, etc.  Non-urgent messages can be sent to your provider as well.   To learn more about what you can do with MyChart, go to [email or web address].

## 2023-11-16 ENCOUNTER — Other Ambulatory Visit: Payer: Self-pay | Admitting: Family Medicine

## 2023-11-16 DIAGNOSIS — J309 Allergic rhinitis, unspecified: Secondary | ICD-10-CM

## 2023-11-17 ENCOUNTER — Other Ambulatory Visit: Payer: Medicare Other

## 2023-11-17 DIAGNOSIS — E7800 Pure hypercholesterolemia, unspecified: Secondary | ICD-10-CM | POA: Diagnosis not present

## 2023-11-17 DIAGNOSIS — N289 Disorder of kidney and ureter, unspecified: Secondary | ICD-10-CM

## 2023-11-17 DIAGNOSIS — I4819 Other persistent atrial fibrillation: Secondary | ICD-10-CM

## 2023-11-17 LAB — COMPREHENSIVE METABOLIC PANEL WITH GFR
ALT: 36 U/L (ref 0–53)
AST: 28 U/L (ref 0–37)
Albumin: 4.1 g/dL (ref 3.5–5.2)
Alkaline Phosphatase: 59 U/L (ref 39–117)
BUN: 20 mg/dL (ref 6–23)
CO2: 30 meq/L (ref 19–32)
Calcium: 9.1 mg/dL (ref 8.4–10.5)
Chloride: 105 meq/L (ref 96–112)
Creatinine, Ser: 1.23 mg/dL (ref 0.40–1.50)
GFR: 55.89 mL/min — ABNORMAL LOW (ref >60.00)
Glucose, Bld: 92 mg/dL (ref 70–99)
Potassium: 4.4 meq/L (ref 3.5–5.1)
Sodium: 141 meq/L (ref 135–145)
Total Bilirubin: 1.2 mg/dL (ref 0.2–1.2)
Total Protein: 6.8 g/dL (ref 6.0–8.3)

## 2023-11-17 LAB — CBC WITH DIFFERENTIAL/PLATELET
Basophils Absolute: 0 K/uL (ref 0.0–0.1)
Basophils Relative: 0.7 % (ref 0.0–3.0)
Eosinophils Absolute: 0.1 K/uL (ref 0.0–0.7)
Eosinophils Relative: 2 % (ref 0.0–5.0)
HCT: 42 % (ref 39.0–52.0)
Hemoglobin: 14.2 g/dL (ref 13.0–17.0)
Lymphocytes Relative: 30.7 % (ref 12.0–46.0)
Lymphs Abs: 2.1 K/uL (ref 0.7–4.0)
MCHC: 33.8 g/dL (ref 30.0–36.0)
MCV: 95.9 fl (ref 78.0–100.0)
Monocytes Absolute: 0.5 K/uL (ref 0.1–1.0)
Monocytes Relative: 7.5 % (ref 3.0–12.0)
Neutro Abs: 4 K/uL (ref 1.4–7.7)
Neutrophils Relative %: 59.1 % (ref 43.0–77.0)
Platelets: 146 K/uL — ABNORMAL LOW (ref 150.0–400.0)
RBC: 4.38 Mil/uL (ref 4.22–5.81)
RDW: 13.5 % (ref 11.5–15.5)
WBC: 6.7 K/uL (ref 4.0–10.5)

## 2023-11-17 LAB — MICROALBUMIN / CREATININE URINE RATIO
Creatinine,U: 114.3 mg/dL
Microalb Creat Ratio: UNDETERMINED mg/g (ref 0.0–30.0)
Microalb, Ur: <0.7 mg/dL

## 2023-11-17 LAB — LIPID PANEL
Cholesterol: 129 mg/dL (ref 0–200)
HDL: 52.1 mg/dL (ref >39.00)
LDL Cholesterol: 56 mg/dL (ref 0–99)
NonHDL: 77.36
Total CHOL/HDL Ratio: 2
Triglycerides: 107 mg/dL (ref 0.0–149.0)
VLDL: 21.4 mg/dL (ref 0.0–40.0)

## 2023-11-20 DIAGNOSIS — Z23 Encounter for immunization: Secondary | ICD-10-CM | POA: Diagnosis not present

## 2023-11-24 ENCOUNTER — Ambulatory Visit: Payer: Medicare Other | Admitting: Family Medicine

## 2023-11-24 ENCOUNTER — Encounter: Payer: Self-pay | Admitting: Family Medicine

## 2023-11-24 VITALS — BP 130/88 | HR 97 | Temp 97.8°F | Ht 70.75 in | Wt 234.0 lb

## 2023-11-24 DIAGNOSIS — Z7189 Other specified counseling: Secondary | ICD-10-CM

## 2023-11-24 DIAGNOSIS — N289 Disorder of kidney and ureter, unspecified: Secondary | ICD-10-CM | POA: Diagnosis not present

## 2023-11-24 DIAGNOSIS — Z8042 Family history of malignant neoplasm of prostate: Secondary | ICD-10-CM

## 2023-11-24 DIAGNOSIS — R251 Tremor, unspecified: Secondary | ICD-10-CM | POA: Diagnosis not present

## 2023-11-24 DIAGNOSIS — I700 Atherosclerosis of aorta: Secondary | ICD-10-CM

## 2023-11-24 DIAGNOSIS — J309 Allergic rhinitis, unspecified: Secondary | ICD-10-CM | POA: Diagnosis not present

## 2023-11-24 DIAGNOSIS — Z86718 Personal history of other venous thrombosis and embolism: Secondary | ICD-10-CM

## 2023-11-24 DIAGNOSIS — E7800 Pure hypercholesterolemia, unspecified: Secondary | ICD-10-CM

## 2023-11-24 DIAGNOSIS — N1831 Chronic kidney disease, stage 3a: Secondary | ICD-10-CM

## 2023-11-24 DIAGNOSIS — R070 Pain in throat: Secondary | ICD-10-CM

## 2023-11-24 DIAGNOSIS — E66811 Obesity, class 1: Secondary | ICD-10-CM | POA: Diagnosis not present

## 2023-11-24 DIAGNOSIS — I714 Abdominal aortic aneurysm, without rupture, unspecified: Secondary | ICD-10-CM

## 2023-11-24 DIAGNOSIS — D696 Thrombocytopenia, unspecified: Secondary | ICD-10-CM | POA: Diagnosis not present

## 2023-11-24 DIAGNOSIS — I10 Essential (primary) hypertension: Secondary | ICD-10-CM | POA: Diagnosis not present

## 2023-11-24 DIAGNOSIS — I4819 Other persistent atrial fibrillation: Secondary | ICD-10-CM

## 2023-11-24 DIAGNOSIS — Z125 Encounter for screening for malignant neoplasm of prostate: Secondary | ICD-10-CM

## 2023-11-24 DIAGNOSIS — F331 Major depressive disorder, recurrent, moderate: Secondary | ICD-10-CM

## 2023-11-24 DIAGNOSIS — R0609 Other forms of dyspnea: Secondary | ICD-10-CM | POA: Insufficient documentation

## 2023-11-24 DIAGNOSIS — Z7901 Long term (current) use of anticoagulants: Secondary | ICD-10-CM

## 2023-11-24 DIAGNOSIS — K219 Gastro-esophageal reflux disease without esophagitis: Secondary | ICD-10-CM

## 2023-11-24 MED ORDER — NYSTATIN 100000 UNIT/ML MT SUSP
5.0000 mL | Freq: Three times a day (TID) | OROMUCOSAL | 0 refills | Status: AC
Start: 2023-11-24 — End: ?

## 2023-11-24 MED ORDER — BUPROPION HCL ER (XL) 150 MG PO TB24
150.0000 mg | ORAL_TABLET | Freq: Every day | ORAL | 3 refills | Status: AC
Start: 2023-11-24 — End: ?

## 2023-11-24 MED ORDER — LOSARTAN POTASSIUM 25 MG PO TABS: 25.0000 mg | ORAL_TABLET | Freq: Every day | ORAL | 3 refills | Status: AC

## 2023-11-24 MED ORDER — OMEPRAZOLE 20 MG PO CPDR: 20.0000 mg | DELAYED_RELEASE_CAPSULE | Freq: Every day | ORAL | 3 refills | Status: AC

## 2023-11-24 NOTE — Assessment & Plan Note (Signed)
 Continues lifelong anticoagulation.

## 2023-11-24 NOTE — Assessment & Plan Note (Signed)
 Chronic, stable period on zetia and atorvastatin - continue. The ASCVD Risk score (Arnett DK, et al., 2019) failed to calculate for the following reasons:   The valid total cholesterol range is 130 to 320 mg/dL

## 2023-11-24 NOTE — Progress Notes (Signed)
 Ph: [phone] Fax: [phone]   Patient ID: Anthony Kim., male    DOB: 04/03/1945, 78 y.o.   MRN: [id number]  This visit was conducted in person.  BP 130/88   Pulse 97   Temp 97.8 F (36.6 C) (Oral)   Ht 5' 10.75 (1.797 m)   Wt 234 lb (106.1 kg)   SpO2 97%   BMI 32.87 kg/m    CC: AMW f/u visit  Subjective:   HPI: Anthony Kim. is a 78 y.o. male presenting on 11/24/2023 for Annual Exam   Saw health advisor 03/2023 for medicare wellness visit. Note reviewed.    No results found.  Flowsheet Row Office Visit from 11/24/2023 in [facility] [place]  PHQ-2 Total Score 0       11/24/2023   11:48 AM 03/28/2023    3:41 PM 01/24/2023   10:51 AM 03/23/2022   11:47 AM 03/10/2021    2:02 PM  Fall Risk   Falls in the past year? 0 0 0 0 0  Number falls in past yr: 0 1  0 0  Injury with Fall? 0 0  0 0  Risk for fall due to : No Fall Risks No Fall Risks  No Fall Risks No Fall Risks  Follow up Falls evaluation completed Education provided;Falls prevention discussed  Falls prevention discussed;Falls evaluation completed Falls prevention discussed      Data saved with a previous flowsheet row definition   Ex smoker - 1.5 ppd x 30 yrs, quit 1990s.  Asthma followed by pulmonology Dr Anthony Kim - symbicort  caused sore throat and hoarseness. This was changed to Trelegy - with similar effect. No white patches to tongue. PFTs normal without response to bronchodilators. Pending rpt CT scan for low normal TLC and increased PEFR. Notes worsening tremors to bilateral hands since starting Trelegy. R handed. No memory changes, stiffness, imbalance.   Atrial fibrillation on coumadin , metoprolol , clonidine . Coumadin  managed by cardiology coumadin  clinic.  He stopped xanax  2 months ago, has felt well off this. Continues wellbutrin  XL 150mg  daily.   HTN - Compliant with current antihypertensive regimen of clonidine  0.1mg  daily, metoprolol  25mg  BID, losartan  25mg   daily and metoprolol  25mg  bid. Does check blood pressures at home: 120/80s.    GERD - saw Dr Anthony Kim GI 07/2021 - to consider EGD to screen for Barrett's, continues omeprazole  20mg  daily with good effect.   Abd aortic US  08/2021: Dilation to 3.2cm - rec rpt US  in 3 yrs.   Preventative: Colonoscopy 2013 WNL, rpt 5 yrs --> changed to 10 yrs Anthony Kim)  Saw GI 07/2021 - rec against repeat colonoscopy given age and coumadin  use.  No blood in stool or BM changes.  Prostate cancer screening - had seen urology with PSA/DRE (Anthony Kim). Father with h/o prostate cancer s/p brachytherapy with good effect. Requests 1 final PSA test.  Lung cancer screening - not eligible Flu shot -yearly COVID vaccine Moderna 02/2019, 03/2019, booster 11/2019 (states he didn't have this) Td 2010 Pneumovax 2013, prevnar-13 2015, prevnar-20 11/2023  Zostavax - 2010  Shingrix - 07/2021, 11/2021 Advanced directive discussion - son Anthony Kim is HCPOA followed by DIL Anthony Kim. Scanned 07/2021. Ok with CPR. Would not want life support if recovery futile. Unsure about feeding tube.  Seat belt use discussed  Sunscreen use discussed. No changing moles on skin.  Ex - smoker - 30+ PY hx, quit cold turkey 1990s  Alcohol - 1-2 glasses wine nightly , none  in the past 11 days Dentist - q6 mo Eye exam - yearly. S/p cataract surgery. Uses saline eye drops. 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 Interim medical history since our last visit reviewed. Allergies and medications reviewed and updated. Outpatient Medications Prior to Visit  Medication Sig Dispense Refill   acetaminophen   (TYLENOL ) 650 MG CR tablet Take 650 mg by mouth 2 (two) times daily.     albuterol  (VENTOLIN  HFA) 108 (90 Base) MCG/ACT inhaler Inhale 2 puffs into the lungs every 6 (six) hours as needed for wheezing or shortness of breath. 8 g 6   ALLERGY RELIEF 180 MG tablet TAKE ONE TABLET DAILY FOR CONGESTION. 90 tablet 0   atorvastatin  (LIPITOR) 40 MG tablet Take 1 tablet (40 mg total) by mouth daily at 6 PM. 90 tablet 3   ezetimibe  (ZETIA ) 10 MG tablet Take 1 tablet (10 mg total) by mouth daily. 90 tablet 3   Fluticasone -Umeclidin-Vilant (TRELEGY ELLIPTA ) 200-62.5-25 MCG/ACT AEPB Inhale 1 puff into the lungs daily. 3 each 6   meclizine  (ANTIVERT ) 25 MG tablet Take 25 mg by mouth 3 (three) times daily as needed for dizziness. Reported on 07/01/2015     metoprolol  tartrate (LOPRESSOR ) 25 MG tablet Take 1 tablet (25 mg total) by mouth 2 (two) times daily. 180 tablet 0   Multiple Vitamin (MULTIVITAMIN PO) Take 1 tablet by mouth daily. pm     warfarin (COUMADIN ) 5 MG tablet Take 1/2 a tablet to 1 tablet by mouth daily or as directed by the coumadin  clinic. 40 tablet 0   buPROPion  (WELLBUTRIN  XL) 150 MG 24 hr tablet Take 1 tablet (150 mg total) by mouth daily. 90 tablet 4   cloNIDine  (CATAPRES ) 0.1 MG tablet Take 1 tablet (0.1 mg total) by mouth 2 (two) times daily. 180 tablet 0   losartan  (COZAAR ) 25 MG tablet Take 1 tablet (25 mg total) by mouth daily. 90 tablet 2   omeprazole  (PRILOSEC) 20 MG capsule Take 1 capsule (20 mg total) by mouth daily. 90 capsule 4   cloNIDine  (CATAPRES ) 0.1 MG tablet Take 0.5 tablets (0.05 mg total) by mouth daily.     budesonide -formoterol  (SYMBICORT ) 160-4.5 MCG/ACT inhaler Inhale 2 puffs into the lungs 2 (two) times daily. (Patient not taking: Reported on 11/24/2023) 1 each 12   Spacer/Aero-Holding Chambers (AEROCHAMBER MV) inhaler Use as instructed (Patient not taking: Reported on 11/24/2023) 1 each 0   No facility-administered medications prior to visit.     Per HPI unless  specifically indicated in ROS section below Review of Systems  Objective:  BP 130/88   Pulse 97   Temp 97.8 F (36.6 C) (Oral)   Ht 5' 10.75 (1.797 m)   Wt 234 lb (106.1 kg)   SpO2 97%   BMI 32.87 kg/m   Wt Readings from Last 3 Encounters:  11/24/23 234 lb (106.1 kg)  11/15/23 239 lb (108.4 kg)  09/25/23 236 lb 9.6 oz (107.3 kg)      Physical Exam Vitals and nursing note reviewed.  Constitutional:      General: He is not in acute distress.    Appearance: Normal appearance. He is well-developed. He  is not ill-appearing.  HENT:     Head: Normocephalic and atraumatic.     Right Ear: Hearing, tympanic membrane, ear canal and external ear normal.     Left Ear: Hearing, tympanic membrane, ear canal and external ear normal.     Mouth/Throat:     Mouth: Mucous membranes are dry.     Pharynx: Oropharynx is clear. No oropharyngeal exudate or posterior oropharyngeal erythema.  Eyes:     General: No scleral icterus.    Extraocular Movements: Extraocular movements intact.     Conjunctiva/sclera: Conjunctivae normal.     Pupils: Pupils are equal, round, and reactive to light.  Neck:     Thyroid : No thyroid  mass or thyromegaly.     Vascular: No carotid bruit.  Cardiovascular:     Rate and Rhythm: Normal rate and regular rhythm.     Pulses: Normal pulses.          Radial pulses are 2+ on the right side and 2+ on the left side.     Heart sounds: Normal heart sounds. No murmur heard. Pulmonary:     Effort: Pulmonary effort is normal. No respiratory distress.     Breath sounds: Normal breath sounds. No wheezing, rhonchi or rales.  Abdominal:     General: Bowel sounds are normal. There is no distension.     Palpations: Abdomen is soft. There is no mass.     Tenderness: There is no abdominal tenderness. There is no guarding or rebound.     Hernia: No hernia is present.  Musculoskeletal:        General: Normal range of motion.     Cervical back: Normal range of motion and neck  supple.     Right lower leg: No edema.     Left lower leg: No edema.  Lymphadenopathy:     Cervical: No cervical adenopathy.  Skin:    General: Skin is warm and dry.     Findings: No rash.  Neurological:     General: No focal deficit present.     Mental Status: He is alert and oriented to person, place, and time.  Psychiatric:        Mood and Affect: Mood normal.        Behavior: Behavior normal.        Thought Content: Thought content normal.        Judgment: Judgment normal.       Results for orders placed or performed in visit on 11/17/23  Microalbumin / creatinine urine ratio   Collection Time: 11/17/23  8:01 AM  Result Value Ref Range   Microalb, Ur <0.7 mg/dL   Creatinine,U 885.6 mg/dL   Microalb Creat Ratio Unable to calculate 0.0 - 30.0 mg/g  CBC with Differential/Platelet   Collection Time: 11/17/23  8:01 AM  Result Value Ref Range   WBC 6.7 4.0 - 10.5 K/uL   RBC 4.38 4.22 - 5.81 Mil/uL   Hemoglobin 14.2 13.0 - 17.0 g/dL   HCT 57.9 60.9 - 47.9 %   MCV 95.9 78.0 - 100.0 fl   MCHC 33.8 30.0 - 36.0 g/dL   RDW 86.4 88.4 - 84.4 %   Platelets 146.0 (L) 150.0 - 400.0 K/uL   Neutrophils Relative % 59.1 43.0 - 77.0 %   Lymphocytes Relative 30.7 12.0 - 46.0 %   Monocytes Relative 7.5 3.0 - 12.0 %   Eosinophils Relative 2.0 0.0 - 5.0 %   Basophils Relative 0.7 0.0 - 3.0 %  Neutro Abs 4.0 1.4 - 7.7 K/uL   Lymphs Abs 2.1 0.7 - 4.0 K/uL   Monocytes Absolute 0.5 0.1 - 1.0 K/uL   Eosinophils Absolute 0.1 0.0 - 0.7 K/uL   Basophils Absolute 0.0 0.0 - 0.1 K/uL  Comprehensive metabolic panel with GFR   Collection Time: 11/17/23  8:01 AM  Result Value Ref Range   Sodium 141 135 - 145 mEq/L   Potassium 4.4 3.5 - 5.1 mEq/L   Chloride 105 96 - 112 mEq/L   CO2 30 19 - 32 mEq/L   Glucose, Bld 92 70 - 99 mg/dL   BUN 20 6 - 23 mg/dL   Creatinine, Ser 8.76 0.40 - 1.50 mg/dL   Total Bilirubin 1.2 0.2 - 1.2 mg/dL   Alkaline Phosphatase 59 39 - 117 U/L   AST 28 0 - 37 U/L   ALT  36 0 - 53 U/L   Total Protein 6.8 6.0 - 8.3 g/dL   Albumin 4.1 3.5 - 5.2 g/dL   GFR 44.10 (L) >39.99 mL/min   Calcium  9.1 8.4 - 10.5 mg/dL  Lipid panel   Collection Time: 11/17/23  8:01 AM  Result Value Ref Range   Cholesterol 129 0 - 200 mg/dL   Triglycerides 892.9 0.0 - 149.0 mg/dL   HDL 47.89 >60.99 mg/dL   VLDL 78.5 0.0 - 59.9 mg/dL   LDL Cholesterol 56 0 - 99 mg/dL   Total CHOL/HDL Ratio 2    NonHDL 77.36    Lab Results  Component Value Date   PSA 0.28 07/14/2021   PSA 0.34 03/11/2020   PSA 0.56 10/18/2018    No results found for: 25OHVITD2, 25OHVITD3, VD25OH  Assessment & Plan:   Problem List Items Addressed This Visit     Advanced care planning/counseling discussion - Primary (Chronic)   Previously discussed.       HYPERCHOLESTEROLEMIA   Chronic, stable period on zetia  and atorvastatin  -continue. The ASCVD Risk score (Arnett DK, et al., 2019) failed to calculate for the following reasons:   The valid total cholesterol range is 130 to 320 mg/dL       Relevant Medications   cloNIDine  (CATAPRES ) 0.1 MG tablet   losartan  (COZAAR ) 25 MG tablet   MDD (major depressive disorder), recurrent episode, moderate (HCC)   Chronic, stable period on wellbutrin  XL, off xanax  for several years now      Relevant Medications   buPROPion  (WELLBUTRIN  XL) 150 MG 24 hr tablet   Essential hypertension   Chronic, stable period on current regimen of losartan  25mg  daily, metoprolol  25mg  bid, and pt endorses taking clonidine  0.1mg  1/2 tablet once daily - continue this.       Relevant Medications   cloNIDine  (CATAPRES ) 0.1 MG tablet   losartan  (COZAAR ) 25 MG tablet   History of DVT of lower extremity   Continues lifelong anticoagulation.       Long term current use of anticoagulant   Allergic rhinitis   No significant allergy symptoms at this time-  recommend drop fexofenadine  from daily to PRN as this could be contributing to dry mouth.       Renal insufficiency    Progressive deterioration in kidney function from 70s to mid 50s over last few years.  Reviewed limiting nephrotoxic agents and ensuring good hydration status. Will return for rpt kidney labs in 3-4 months.       Relevant Orders   Renal function panel   VITAMIN D 25 Hydroxy (Vit-D Deficiency, Fractures)   Parathyroid hormone,  intact (no Ca)   Urinalysis, Routine w reflex microscopic   Obesity, Class I, BMI 30.0-34.9 (see actual BMI)   Reviewed healthy diet and lifestyle changes to effect sustainable weight loss.        Persistent atrial fibrillation (HCC)   Continue coumadin  by cardiology coumadin  clinic, also on metoprolol        Relevant Medications   cloNIDine  (CATAPRES ) 0.1 MG tablet   losartan  (COZAAR ) 25 MG tablet   Abdominal aortic atherosclerosis   Continue statin, zetia .       Relevant Medications   cloNIDine  (CATAPRES ) 0.1 MG tablet   losartan  (COZAAR ) 25 MG tablet   AAA (abdominal aortic aneurysm) without rupture   Will be due for repeat US  08/2024      Relevant Medications   cloNIDine  (CATAPRES ) 0.1 MG tablet   losartan  (COZAAR ) 25 MG tablet   Family history of prostate cancer in father   Will check PSA 1 more time at next labs.       Relevant Orders   PSA, Medicare   GERD (gastroesophageal reflux disease)   Chronic, stable period on omeprazole  20mg  daily.       Relevant Medications   omeprazole  (PRILOSEC) 20 MG capsule   Exertional dyspnea   Seeing pulm suspected asthma, pending CT r/o ILD.  He was tried on symbicort , and is now on Trelegy, with improvement in breathing but with several side effects as per below. He will f/u with pulm       Throat pain in adult   Sore throat hoarseness and mouth pain since starting controller inhalers (first Symbicort  then Trelegy).  No obvious oral thrush on exam,but will Rx nystatin suspension to treat possible pharyngeal thrush.  He will f/u with pulm if ongoing symptoms.       Tremor   Longstanding L hand,  worse since starting controller respiratory medications.  Suspect essential tremor.  Avoid propranolol in h/o asthma. Consider gabapentin - he opts to discuss other respiratory medication options with pulm prior to adding medication for this.       Thrombocytopenia   Mild - will monitor      Other Visit Diagnoses       CKD stage 3a, GFR 45-59 ml/min (HCC)       Relevant Orders   VITAMIN D 25 Hydroxy (Vit-D Deficiency, Fractures)     Special screening for malignant neoplasm of prostate       Relevant Orders   PSA, Medicare        Meds ordered this encounter  Medications   buPROPion  (WELLBUTRIN  XL) 150 MG 24 hr tablet    Sig: Take 1 tablet (150 mg total) by mouth daily.    Dispense:  90 tablet    Refill:  3   losartan  (COZAAR ) 25 MG tablet    Sig: Take 1 tablet (25 mg total) by mouth daily.    Dispense:  90 tablet    Refill:  3   omeprazole  (PRILOSEC) 20 MG capsule    Sig: Take 1 capsule (20 mg total) by mouth daily.    Dispense:  90 capsule    Refill:  3   nystatin (MYCOSTATIN) 100000 UNIT/ML suspension    Sig: Take 5 mLs (500,000 Units total) by mouth 3 (three) times daily.    Dispense:  120 mL    Refill:  0    Orders Placed This Encounter  Procedures   Renal function panel    Standing Status:   Future  Expiration Date:   11/23/2024   VITAMIN D 25 Hydroxy (Vit-D Deficiency, Fractures)    Standing Status:   Future    Expiration Date:   11/23/2024   Parathyroid hormone, intact (no Ca)    Standing Status:   Future    Expiration Date:   11/23/2024   Urinalysis, Routine w reflex microscopic    Standing Status:   Future    Expiration Date:   11/23/2024   PSA, Medicare    Standing Status:   Future    Expiration Date:   11/23/2024    Patient Instructions  Try nystatin swish and swallow for possible pharyngeal thrush.  Change allegra / fexofenadine  to only as needed.  Check with pulmonology about other options for breathing medicine if Trelegy is causing worsening  tremors. Let me know if you'd like to try gabapentin for tremors.  Kidney function was a bit worse this latest check - ensure good water intake, watch salt/sodium intake, avoid ibuprofen/ aleve. Tylenol  is ok.  Schedule repeat labs in 3-4 months to recheck kidney function.  Good to see you today Return as needed or in 1 year for next wellness visit follow up   Follow up plan: Return in about 1 year (around 11/23/2024) for medicare wellness visit, follow up visit.  Anton Blas, MD

## 2023-11-24 NOTE — Assessment & Plan Note (Signed)
 No significant allergy symptoms at this time-  recommend drop fexofenadine  from daily to PRN as this could be contributing to dry mouth.

## 2023-11-24 NOTE — Assessment & Plan Note (Signed)
 Sore throat hoarseness and mouth pain since starting controller inhalers (first Symbicort  then Trelegy).  No obvious oral thrush on exam,but will Rx nystatin suspension to treat possible pharyngeal thrush.  He will f/u with pulm if ongoing symptoms.

## 2023-11-24 NOTE — Assessment & Plan Note (Signed)
 Chronic, stable period on current regimen of losartan  25mg  daily, metoprolol  25mg  bid, and pt endorses taking clonidine  0.1mg  1/2 tablet once daily - continue this.

## 2023-11-24 NOTE — Assessment & Plan Note (Signed)
 Chronic, stable period on wellbutrin  XL, off xanax  for several years now

## 2023-11-24 NOTE — Assessment & Plan Note (Signed)
 Will be due for repeat US  08/2024

## 2023-11-24 NOTE — Assessment & Plan Note (Signed)
 Seeing pulm suspected asthma, pending CT r/o ILD.  He was tried on symbicort , and is now on Trelegy, with improvement in breathing but with several side effects as per below. He will f/u with pulm

## 2023-11-24 NOTE — Assessment & Plan Note (Signed)
 Continue coumadin  by cardiology coumadin  clinic, also on metoprolol 

## 2023-11-24 NOTE — Assessment & Plan Note (Signed)
Mild - will monitor 

## 2023-11-24 NOTE — Assessment & Plan Note (Signed)
 Will check PSA 1 more time at next labs.

## 2023-11-24 NOTE — Assessment & Plan Note (Signed)
 Previously discussed.

## 2023-11-24 NOTE — Assessment & Plan Note (Signed)
 Reviewed healthy diet and lifestyle changes to effect sustainable weight loss.

## 2023-11-24 NOTE — Assessment & Plan Note (Addendum)
 Longstanding L hand, worse since starting controller respiratory medications.  Suspect essential tremor.  Avoid propranolol in h/o asthma. Consider gabapentin - he opts to discuss other respiratory medication options with pulm prior to adding medication for this.

## 2023-11-24 NOTE — Assessment & Plan Note (Signed)
Continue statin, zetia.  

## 2023-11-24 NOTE — Assessment & Plan Note (Signed)
Chronic, stable period on omeprazole 20mg  daily

## 2023-11-24 NOTE — Patient Instructions (Addendum)
 Try nystatin swish and swallow for possible pharyngeal thrush.  Change allegra / fexofenadine  to only as needed.  Check with pulmonology about other options for breathing medicine if Trelegy is causing worsening tremors. Let me know if you'd like to try gabapentin for tremors.  Kidney function was a bit worse this latest check - ensure good water intake, watch salt/sodium intake, avoid ibuprofen/ aleve. Tylenol  is ok.  Schedule repeat labs in 3-4 months to recheck kidney function.  Good to see you today Return as needed or in 1 year for next wellness visit follow up

## 2023-11-24 NOTE — Assessment & Plan Note (Addendum)
 Progressive deterioration in kidney function from 70s to mid 50s over last few years.  Reviewed limiting nephrotoxic agents and ensuring good hydration status. Will return for rpt kidney labs in 3-4 months.

## 2023-12-04 ENCOUNTER — Ambulatory Visit
Admission: RE | Admit: 2023-12-04 | Discharge: 2023-12-04 | Disposition: A | Discharge status: Home / Self Care | Source: Ambulatory Visit | Attending: Pulmonary Disease | Admitting: Pulmonary Disease

## 2023-12-04 DIAGNOSIS — R0602 Shortness of breath: Secondary | ICD-10-CM | POA: Insufficient documentation

## 2023-12-04 DIAGNOSIS — I7121 Aneurysm of the ascending aorta, without rupture: Secondary | ICD-10-CM | POA: Diagnosis not present

## 2023-12-04 DIAGNOSIS — R918 Other nonspecific abnormal finding of lung field: Secondary | ICD-10-CM | POA: Diagnosis not present

## 2023-12-04 DIAGNOSIS — I517 Cardiomegaly: Secondary | ICD-10-CM | POA: Diagnosis not present

## 2023-12-06 ENCOUNTER — Ambulatory Visit: Attending: Cardiovascular Disease

## 2023-12-06 DIAGNOSIS — Z5181 Encounter for therapeutic drug level monitoring: Secondary | ICD-10-CM | POA: Diagnosis not present

## 2023-12-06 DIAGNOSIS — I4819 Other persistent atrial fibrillation: Secondary | ICD-10-CM | POA: Diagnosis not present

## 2023-12-06 LAB — POCT INR: INR: 1.5 — AB (ref 2.0–3.0)

## 2023-12-06 NOTE — Patient Instructions (Signed)
 Take 2 tablets today only then Continue Warfarin 1 tablet daily except 1.5 tablets on Fridays.  Recheck in 4 weeks.  [phone]

## 2023-12-13 ENCOUNTER — Other Ambulatory Visit: Payer: Self-pay | Admitting: Cardiovascular Disease

## 2023-12-13 DIAGNOSIS — I4819 Other persistent atrial fibrillation: Secondary | ICD-10-CM

## 2023-12-13 NOTE — Telephone Encounter (Signed)
 Warfarin 5mg  refill Afib Last INR 12/06/23 Last OV 11/15/23

## 2023-12-20 ENCOUNTER — Other Ambulatory Visit: Payer: Self-pay | Admitting: Family Medicine

## 2023-12-20 DIAGNOSIS — J309 Allergic rhinitis, unspecified: Secondary | ICD-10-CM

## 2023-12-25 ENCOUNTER — Ambulatory Visit: Admitting: Pulmonary Disease

## 2023-12-25 ENCOUNTER — Encounter: Payer: Self-pay | Admitting: Pulmonary Disease

## 2023-12-25 VITALS — BP 124/74 | HR 74 | Temp 98.7°F | Ht 70.75 in | Wt 235.6 lb

## 2023-12-25 DIAGNOSIS — Z87891 Personal history of nicotine dependence: Secondary | ICD-10-CM

## 2023-12-25 DIAGNOSIS — E785 Hyperlipidemia, unspecified: Secondary | ICD-10-CM | POA: Diagnosis not present

## 2023-12-25 DIAGNOSIS — R0609 Other forms of dyspnea: Secondary | ICD-10-CM | POA: Diagnosis not present

## 2023-12-25 DIAGNOSIS — I10 Essential (primary) hypertension: Secondary | ICD-10-CM | POA: Diagnosis not present

## 2023-12-25 DIAGNOSIS — J309 Allergic rhinitis, unspecified: Secondary | ICD-10-CM

## 2023-12-25 DIAGNOSIS — R0602 Shortness of breath: Secondary | ICD-10-CM

## 2023-12-25 NOTE — Progress Notes (Signed)
 Synopsis: Referred in by Rilla Baller, MD   Subjective:   PATIENT ID: Anthony Kim. GENDER: male DOB: [birth year], MRN: [id number]  Chief Complaint  Patient presents with   Shortness of Breath    SOB is better. No wheezing. Dry cough.  Trelegy- daily, helps with his breathing, but makes his hoarse. Albuterol - PRN    HPI Mr. Kriz is a pleasant 78 year old male patient with a past medical history of hyperlipidemia, hypertension, allergic rhinitis A-fib on Coumadin  presenting today to the pulmonary clinic for ongoing dyspnea on exertion.  Since September he has been complaining of worsening dyspnea on exertion after he had a URI.  He was able to walk a mile to a mile and a half however recently he is unable to walk couple 100 feet before pausing for a break.  It is associated with a cough that is mostly dry.  Denies any chest tightness or wheezing.  He does have hypersensitivity to strong scents.  No history of asthma and no eczema.  Chest x-ray 11/24 without any active cardiopulmonary disease.  He had an extensive cardiac workup which has been unrevealing including perfusion scan which showed normal LV perfusion, no regional wall motion abnormality.  Echocardiogram 04/26/2023 with normal EF.  Family history -father with asthma.  Social history -quit smoking 32 years ago smoked 1 pack/day for 30 years.   OV 09/25/2023 - Mr. Schalk is here to follow up on his PFTs. He reports that symbicort  did help him but not much, it is easier for him to breath when it is lower humidity and in the air conditioner. PFTs were normal however TLC at [facility] and PEFR is elevated suggesting an early restrictive pattern but could be seen in obesity. I will change his inhaler to Trelegy 200 and obtain a CT chest wo contrast to assess for any ILD.   OV 12/25/2023 -Mr. 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  He does see significant response  with Trelegy in terms of breathing and dyspnea on exertion.  He rarely uses his rescue inhaler.  The only complaint he has is hoarseness of the voice I discussed switching to Breztri and using a chamber but he already tried that with Symbicort  and had similar problem.  He feels that continuing with Trelegy and during hoarseness of the voice is appropriate specifically with its impact on breathing.  We reviewed his CT chest that showed interstitial lung abnormalities and multiple small pulmonary nodules.  No need for repeat CT chest.  I will see him for follow-up in 6 months.  ROS All systems were reviewed and are negative except for the above.  Objective:   Vitals:   12/25/23 1105  BP: 124/74  Pulse: 74  Temp: 98.7 F (37.1 C)  SpO2: 96%  Weight: 235 lb 9.6 oz (106.9 kg)  Height: 5' 10.75 (1.797 m)   96% on RA BMI Readings from Last 3 Encounters:  12/25/23 33.09 kg/m  11/24/23 32.87 kg/m  11/15/23 34.29 kg/m   Wt Readings from Last 3 Encounters:  12/25/23 235 lb 9.6 oz (106.9 kg)  11/24/23 234 lb (106.1 kg)  11/15/23 239 lb (108.4 kg)    Physical Exam GEN: NAD, Healthy Appearing HEENT: Supple Neck, Reactive Pupils, EOMI  CVS: Normal S1, Normal S2, RRR, No murmurs or ES appreciated  Lungs: Clear bilateral air entry.  Abdomen: Soft, non tender, non distended, + BS  Extremities: Warm and  well perfused, No edema  Skin: No suspicious lesions appreciated  Psych: Normal Affect  Ancillary Information   CBC    Component Value Date/Time   WBC 6.7 11/17/2023 0801   RBC 4.38 11/17/2023 0801   HGB 14.2 11/17/2023 0801   HGB 14.4 07/25/2016 0829   HCT 42.0 11/17/2023 0801   HCT 43.8 07/25/2016 0829   PLT 146.0 (L) 11/17/2023 0801   PLT 180 07/25/2016 0829   MCV 95.9 11/17/2023 0801   MCV 92 07/25/2016 0829   MCH 31.9 09/16/2018 1203   MCHC 33.8 11/17/2023 0801   RDW 13.5 11/17/2023 0801   RDW 14.0 07/25/2016 0829   LYMPHSABS 2.1 11/17/2023 0801   LYMPHSABS 2.5  07/25/2016 0829   MONOABS 0.5 11/17/2023 0801   EOSABS 0.1 11/17/2023 0801   EOSABS 0.2 07/25/2016 0829   BASOSABS 0.0 11/17/2023 0801   BASOSABS 0.0 07/25/2016 0829   Labs and imaging were reviewed.    Latest Ref Rng & Units 09/25/2023   12:29 PM  PFT Results  FVC-Pre L 4.24   FVC-Predicted Pre % 102   FVC-Post L 4.25   FVC-Predicted Post % 102   Pre FEV1/FVC % % 83   Post FEV1/FCV % % 83   FEV1-Pre L 3.53   FEV1-Predicted Pre % 118   FEV1-Post L 3.54   DLCO uncorrected ml/min/mmHg 23.59   DLCO UNC% % 95   DLVA Predicted % 94   TLC L 6.27   TLC % Predicted % 87   RV % Predicted % 76      Assessment & Plan:  Mr. Mcgath is a pleasant 78 year old male patient with a past medical history of hyperlipidemia, hypertension, allergic rhinitis A-fib on Coumadin  presenting today to the pulmonary clinic for ongoing dyspnea on exertion.  #Shortness of breath on exertion and keep with adult onset asthma Started after a URI in September, unclear if it was COVID. Cardiac work up has been negative. Impression is that this is consistent with post viral reactive airway disease/Asthma. CXR is normal.  FENO 27 indeterminate for pulmonary eosinophilic inflammation.  PFTs normal without any significant response to bdl.  This is still seen in Asthma and it remains the working diagnosis.  PFTs did show TLC at [facility] and increased PEFR for which I will obtain a CT chest wo contrast to evaluate for any ILD.  CT chest without contrast with only interstitial lung abnormality.  Good response to Trelegy.  []  Continue with Trelegy Ellipta  200 1 puff daily. Rinse mouth after each use. []  Continue with albuterol  on an as needed basis.  RTC 6 months.  I personally spent a total of 30 minutes in the care of the patient today including preparing to see the patient, getting/reviewing separately obtained history, performing a medically appropriate exam/evaluation, counseling and educating, documenting clinical  information in the EHR, independently interpreting results, and communicating results.   Darrin Barn, MD [facility] 12/25/2023 11:42 AM

## 2023-12-27 DIAGNOSIS — H903 Sensorineural hearing loss, bilateral: Secondary | ICD-10-CM | POA: Diagnosis not present

## 2023-12-27 DIAGNOSIS — H6123 Impacted cerumen, bilateral: Secondary | ICD-10-CM | POA: Diagnosis not present

## 2024-01-03 ENCOUNTER — Ambulatory Visit: Attending: Cardiovascular Disease

## 2024-01-03 DIAGNOSIS — I4819 Other persistent atrial fibrillation: Secondary | ICD-10-CM | POA: Diagnosis present

## 2024-01-03 DIAGNOSIS — Z5181 Encounter for therapeutic drug level monitoring: Secondary | ICD-10-CM | POA: Insufficient documentation

## 2024-01-03 LAB — POCT INR: INR: 1.5 — AB (ref 2.0–3.0)

## 2024-01-03 NOTE — Patient Instructions (Signed)
 Take 1.5 tablets today only then Increase to 1 tablet daily except 1.5 tablets on Mondays and Fridays.  Recheck in 4 weeks.  [phone]

## 2024-01-03 NOTE — Progress Notes (Signed)
 done

## 2024-01-31 ENCOUNTER — Ambulatory Visit: Attending: Cardiovascular Disease

## 2024-01-31 DIAGNOSIS — I4819 Other persistent atrial fibrillation: Secondary | ICD-10-CM | POA: Insufficient documentation

## 2024-01-31 DIAGNOSIS — Z5181 Encounter for therapeutic drug level monitoring: Secondary | ICD-10-CM | POA: Diagnosis present

## 2024-01-31 LAB — POCT INR: INR: 2.1 (ref 2.0–3.0)

## 2024-01-31 NOTE — Patient Instructions (Signed)
 Continue 1 tablet daily except 1.5 tablets on Mondays and Fridays.  Recheck in 6 weeks.  [phone]

## 2024-02-14 ENCOUNTER — Other Ambulatory Visit: Payer: Self-pay | Admitting: Cardiovascular Disease

## 2024-03-12 ENCOUNTER — Other Ambulatory Visit

## 2024-03-13 ENCOUNTER — Ambulatory Visit

## 2024-03-28 ENCOUNTER — Ambulatory Visit

## 2024-04-03 ENCOUNTER — Ambulatory Visit

## 2024-04-18 ENCOUNTER — Ambulatory Visit: Admitting: Student

## 2024-11-18 ENCOUNTER — Other Ambulatory Visit

## 2024-11-25 ENCOUNTER — Ambulatory Visit: Admitting: Family Medicine
